# Patient Record
Sex: Female | Born: 1935
Health system: Southern US, Community
[De-identification: ages and names within clinical notes are randomized; demographics above are authoritative.]

## PROBLEM LIST (undated history)

## (undated) DIAGNOSIS — M810 Age-related osteoporosis without current pathological fracture: Secondary | ICD-10-CM

## (undated) DIAGNOSIS — E7800 Pure hypercholesterolemia, unspecified: Secondary | ICD-10-CM

## (undated) DIAGNOSIS — I214 Non-ST elevation (NSTEMI) myocardial infarction: Secondary | ICD-10-CM

## (undated) DIAGNOSIS — I129 Hypertensive chronic kidney disease with stage 1 through stage 4 chronic kidney disease, or unspecified chronic kidney disease: Secondary | ICD-10-CM

## (undated) DIAGNOSIS — N183 Chronic kidney disease, stage 3 unspecified: Secondary | ICD-10-CM

## (undated) DIAGNOSIS — E785 Hyperlipidemia, unspecified: Secondary | ICD-10-CM

## (undated) DIAGNOSIS — E039 Hypothyroidism, unspecified: Secondary | ICD-10-CM

## (undated) DIAGNOSIS — Z9861 Coronary angioplasty status: Secondary | ICD-10-CM

## (undated) DIAGNOSIS — E1122 Type 2 diabetes mellitus with diabetic chronic kidney disease: Secondary | ICD-10-CM

## (undated) DIAGNOSIS — M1990 Unspecified osteoarthritis, unspecified site: Secondary | ICD-10-CM

## (undated) DIAGNOSIS — I10 Essential (primary) hypertension: Secondary | ICD-10-CM

## (undated) DIAGNOSIS — I119 Hypertensive heart disease without heart failure: Secondary | ICD-10-CM

## (undated) DIAGNOSIS — I2510 Atherosclerotic heart disease of native coronary artery without angina pectoris: Secondary | ICD-10-CM

## (undated) DIAGNOSIS — R809 Proteinuria, unspecified: Secondary | ICD-10-CM

## (undated) DIAGNOSIS — IMO0001 Reserved for inherently not codable concepts without codable children: Secondary | ICD-10-CM

## (undated) DIAGNOSIS — D649 Anemia, unspecified: Secondary | ICD-10-CM

## (undated) DIAGNOSIS — I4891 Unspecified atrial fibrillation: Secondary | ICD-10-CM

## (undated) DIAGNOSIS — G5600 Carpal tunnel syndrome, unspecified upper limb: Secondary | ICD-10-CM

## (undated) DIAGNOSIS — E559 Vitamin D deficiency, unspecified: Secondary | ICD-10-CM

## (undated) DIAGNOSIS — M48061 Spinal stenosis, lumbar region without neurogenic claudication: Secondary | ICD-10-CM

## (undated) DIAGNOSIS — M4800 Spinal stenosis, site unspecified: Secondary | ICD-10-CM

## (undated) DIAGNOSIS — R739 Hyperglycemia, unspecified: Secondary | ICD-10-CM

## (undated) DIAGNOSIS — Z9289 Personal history of other medical treatment: Secondary | ICD-10-CM

## (undated) DIAGNOSIS — I495 Sick sinus syndrome: Secondary | ICD-10-CM

## (undated) HISTORY — DX: Spinal stenosis, site unspecified: M48.00

## (undated) HISTORY — DX: Reserved for inherently not codable concepts without codable children: IMO0001

## (undated) HISTORY — DX: Atherosclerotic heart disease of native coronary artery without angina pectoris: I25.10

## (undated) HISTORY — DX: Coronary angioplasty status: Z98.61

## (undated) HISTORY — DX: Anemia, unspecified: D64.9

## (undated) HISTORY — DX: Sick sinus syndrome: I49.5

## (undated) HISTORY — DX: Pure hypercholesterolemia, unspecified: E78.00

## (undated) HISTORY — DX: Unspecified osteoarthritis, unspecified site: M19.90

## (undated) HISTORY — DX: Hypothyroidism, unspecified: E03.9

## (undated) HISTORY — DX: Proteinuria, unspecified: R80.9

## (undated) HISTORY — DX: Spinal stenosis, lumbar region without neurogenic claudication: M48.061

## (undated) HISTORY — DX: Hypertensive heart disease without heart failure: I11.9

## (undated) HISTORY — DX: Non-ST elevation (NSTEMI) myocardial infarction: I21.4

## (undated) HISTORY — DX: Chronic kidney disease, stage 3 unspecified: N18.30

## (undated) HISTORY — DX: Type 2 diabetes mellitus with diabetic chronic kidney disease: E11.22

## (undated) HISTORY — PX: OOPHORECTOMY: SHX86

## (undated) HISTORY — DX: Carpal tunnel syndrome, unspecified upper limb: G56.00

## (undated) HISTORY — DX: Age-related osteoporosis without current pathological fracture: M81.0

## (undated) HISTORY — DX: Personal history of other medical treatment: Z92.89

## (undated) HISTORY — DX: Vitamin D deficiency, unspecified: E55.9

## (undated) HISTORY — DX: Hyperglycemia, unspecified: R73.9

## (undated) HISTORY — DX: Essential (primary) hypertension: I10

## (undated) HISTORY — DX: Hyperlipidemia, unspecified: E78.5

## (undated) HISTORY — DX: Chronic kidney disease, stage 3 (moderate): N18.3

## (undated) HISTORY — DX: Hypertensive chronic kidney disease with stage 1 through stage 4 chronic kidney disease, or unspecified chronic kidney disease: I12.9

## (undated) HISTORY — DX: Unspecified atrial fibrillation: I48.91

## (undated) HISTORY — PX: VESICOVAGINAL FISTULA CLOSURE W/ TAH: SUR271

---

## 1998-09-09 ENCOUNTER — Ambulatory Visit (HOSPITAL_BASED_OUTPATIENT_CLINIC_OR_DEPARTMENT_OTHER): Admission: RE | Admit: 1998-09-09 | Discharge: 1998-09-09 | Payer: Self-pay | Admitting: Orthopedic Surgery

## 2000-03-12 ENCOUNTER — Emergency Department (HOSPITAL_COMMUNITY): Admission: EM | Admit: 2000-03-12 | Discharge: 2000-03-12 | Payer: Self-pay | Admitting: Emergency Medicine

## 2000-03-12 ENCOUNTER — Encounter: Payer: Self-pay | Admitting: Emergency Medicine

## 2000-04-21 ENCOUNTER — Encounter: Admission: RE | Admit: 2000-04-21 | Discharge: 2000-05-03 | Payer: Self-pay | Admitting: Geriatric Medicine

## 2002-01-18 HISTORY — PX: PACEMAKER INSERTION: SHX728

## 2002-02-05 ENCOUNTER — Inpatient Hospital Stay (HOSPITAL_COMMUNITY): Admission: RE | Admit: 2002-02-05 | Discharge: 2002-02-07 | Payer: Self-pay | Admitting: Cardiology

## 2002-02-19 ENCOUNTER — Emergency Department (HOSPITAL_COMMUNITY): Admission: EM | Admit: 2002-02-19 | Discharge: 2002-02-19 | Payer: Self-pay | Admitting: Emergency Medicine

## 2002-02-19 ENCOUNTER — Encounter: Payer: Self-pay | Admitting: Emergency Medicine

## 2002-05-08 ENCOUNTER — Ambulatory Visit (HOSPITAL_COMMUNITY): Admission: RE | Admit: 2002-05-08 | Discharge: 2002-05-09 | Payer: Self-pay | Admitting: Cardiology

## 2002-05-08 HISTORY — PX: PERCUTANEOUS CORONARY STENT INTERVENTION (PCI-S): SHX6016

## 2002-05-09 ENCOUNTER — Encounter: Payer: Self-pay | Admitting: Cardiology

## 2002-05-22 ENCOUNTER — Emergency Department (HOSPITAL_COMMUNITY): Admission: EM | Admit: 2002-05-22 | Discharge: 2002-05-22 | Payer: Self-pay | Admitting: *Deleted

## 2002-05-22 ENCOUNTER — Encounter: Payer: Self-pay | Admitting: *Deleted

## 2002-05-22 ENCOUNTER — Inpatient Hospital Stay (HOSPITAL_COMMUNITY): Admission: EM | Admit: 2002-05-22 | Discharge: 2002-05-24 | Payer: Self-pay | Admitting: Emergency Medicine

## 2003-04-29 ENCOUNTER — Ambulatory Visit (HOSPITAL_COMMUNITY): Admission: RE | Admit: 2003-04-29 | Discharge: 2003-04-29 | Payer: Self-pay | Admitting: Cardiology

## 2003-05-04 ENCOUNTER — Ambulatory Visit (HOSPITAL_COMMUNITY): Admission: RE | Admit: 2003-05-04 | Discharge: 2003-05-04 | Payer: Self-pay | Admitting: Internal Medicine

## 2003-05-06 ENCOUNTER — Inpatient Hospital Stay (HOSPITAL_COMMUNITY): Admission: EM | Admit: 2003-05-06 | Discharge: 2003-05-13 | Payer: Self-pay | Admitting: Emergency Medicine

## 2003-05-13 ENCOUNTER — Inpatient Hospital Stay: Admission: RE | Admit: 2003-05-13 | Discharge: 2003-05-18 | Payer: Self-pay | Admitting: Internal Medicine

## 2004-04-06 ENCOUNTER — Encounter: Admission: RE | Admit: 2004-04-06 | Discharge: 2004-04-06 | Payer: Self-pay | Admitting: Geriatric Medicine

## 2005-04-08 ENCOUNTER — Encounter: Admission: RE | Admit: 2005-04-08 | Discharge: 2005-04-08 | Payer: Self-pay | Admitting: Geriatric Medicine

## 2005-05-04 ENCOUNTER — Ambulatory Visit: Payer: Self-pay | Admitting: Internal Medicine

## 2005-05-08 ENCOUNTER — Emergency Department (HOSPITAL_COMMUNITY): Admission: EM | Admit: 2005-05-08 | Discharge: 2005-05-08 | Payer: Self-pay | Admitting: Emergency Medicine

## 2006-03-04 ENCOUNTER — Ambulatory Visit: Payer: Self-pay | Admitting: Vascular Surgery

## 2006-04-09 ENCOUNTER — Emergency Department (HOSPITAL_COMMUNITY): Admission: EM | Admit: 2006-04-09 | Discharge: 2006-04-09 | Payer: Self-pay | Admitting: Emergency Medicine

## 2006-04-15 ENCOUNTER — Encounter: Admission: RE | Admit: 2006-04-15 | Discharge: 2006-04-15 | Payer: Self-pay | Admitting: Geriatric Medicine

## 2006-04-27 ENCOUNTER — Encounter: Admission: RE | Admit: 2006-04-27 | Discharge: 2006-04-27 | Payer: Self-pay | Admitting: Geriatric Medicine

## 2006-08-10 ENCOUNTER — Emergency Department (HOSPITAL_COMMUNITY): Admission: EM | Admit: 2006-08-10 | Discharge: 2006-08-10 | Payer: Self-pay | Admitting: Emergency Medicine

## 2006-09-02 ENCOUNTER — Ambulatory Visit: Payer: Self-pay | Admitting: Vascular Surgery

## 2007-01-02 ENCOUNTER — Encounter: Admission: RE | Admit: 2007-01-02 | Discharge: 2007-01-02 | Payer: Self-pay | Admitting: Geriatric Medicine

## 2007-01-20 ENCOUNTER — Inpatient Hospital Stay (HOSPITAL_COMMUNITY): Admission: EM | Admit: 2007-01-20 | Discharge: 2007-01-21 | Payer: Self-pay | Admitting: Emergency Medicine

## 2007-04-18 ENCOUNTER — Encounter: Admission: RE | Admit: 2007-04-18 | Discharge: 2007-04-18 | Payer: Self-pay | Admitting: Geriatric Medicine

## 2008-05-03 ENCOUNTER — Encounter: Admission: RE | Admit: 2008-05-03 | Discharge: 2008-05-03 | Payer: Self-pay | Admitting: Geriatric Medicine

## 2008-05-15 ENCOUNTER — Encounter: Admission: RE | Admit: 2008-05-15 | Discharge: 2008-05-15 | Payer: Self-pay | Admitting: Geriatric Medicine

## 2009-02-07 HISTORY — PX: CARPAL TUNNEL RELEASE: SHX101

## 2009-05-02 ENCOUNTER — Ambulatory Visit (HOSPITAL_BASED_OUTPATIENT_CLINIC_OR_DEPARTMENT_OTHER): Admission: RE | Admit: 2009-05-02 | Discharge: 2009-05-02 | Payer: Self-pay | Admitting: Orthopedic Surgery

## 2009-06-09 ENCOUNTER — Encounter: Admission: RE | Admit: 2009-06-09 | Discharge: 2009-06-09 | Payer: Self-pay | Admitting: Geriatric Medicine

## 2009-07-18 ENCOUNTER — Observation Stay (HOSPITAL_COMMUNITY): Admission: EM | Admit: 2009-07-18 | Discharge: 2009-07-19 | Payer: Self-pay | Admitting: Emergency Medicine

## 2009-07-19 ENCOUNTER — Encounter (INDEPENDENT_AMBULATORY_CARE_PROVIDER_SITE_OTHER): Payer: Self-pay | Admitting: Internal Medicine

## 2009-07-19 DIAGNOSIS — Z9289 Personal history of other medical treatment: Secondary | ICD-10-CM

## 2009-07-19 HISTORY — DX: Personal history of other medical treatment: Z92.89

## 2009-08-04 ENCOUNTER — Ambulatory Visit (HOSPITAL_COMMUNITY): Admission: RE | Admit: 2009-08-04 | Discharge: 2009-08-04 | Payer: Self-pay | Admitting: Cardiology

## 2009-08-04 HISTORY — PX: PACEMAKER GENERATOR CHANGE: SHX5998

## 2009-09-21 DIAGNOSIS — I214 Non-ST elevation (NSTEMI) myocardial infarction: Secondary | ICD-10-CM

## 2009-09-21 HISTORY — DX: Non-ST elevation (NSTEMI) myocardial infarction: I21.4

## 2009-09-21 HISTORY — PX: CORONARY ANGIOPLASTY WITH STENT PLACEMENT: SHX49

## 2009-09-22 ENCOUNTER — Inpatient Hospital Stay (HOSPITAL_COMMUNITY): Admission: EM | Admit: 2009-09-22 | Discharge: 2009-09-25 | Payer: Self-pay | Admitting: Emergency Medicine

## 2009-12-30 ENCOUNTER — Inpatient Hospital Stay (HOSPITAL_COMMUNITY)
Admission: EM | Admit: 2009-12-30 | Discharge: 2009-12-31 | Payer: Self-pay | Source: Home / Self Care | Attending: Cardiology | Admitting: Cardiology

## 2010-02-08 ENCOUNTER — Encounter: Payer: Self-pay | Admitting: Geriatric Medicine

## 2010-02-18 DIAGNOSIS — R809 Proteinuria, unspecified: Secondary | ICD-10-CM

## 2010-02-18 HISTORY — DX: Proteinuria, unspecified: R80.9

## 2010-03-30 LAB — BASIC METABOLIC PANEL
BUN: 22 mg/dL (ref 6–23)
CO2: 24 mEq/L (ref 19–32)
Calcium: 9.5 mg/dL (ref 8.4–10.5)
Creatinine, Ser: 1 mg/dL (ref 0.4–1.2)
GFR calc non Af Amer: 52 mL/min — ABNORMAL LOW (ref >60)
Glucose, Bld: 95 mg/dL (ref 70–99)

## 2010-03-30 LAB — URINALYSIS, ROUTINE W REFLEX MICROSCOPIC
Bilirubin Urine: NEGATIVE
Glucose, UA: NEGATIVE mg/dL
Ketones, ur: NEGATIVE mg/dL
Leukocytes, UA: NEGATIVE
Protein, ur: NEGATIVE mg/dL

## 2010-03-30 LAB — POCT CARDIAC MARKERS
CKMB, poc: 2.6 ng/mL (ref 1.0–8.0)
Myoglobin, poc: 113 ng/mL (ref 12–200)

## 2010-03-30 LAB — MRSA PCR SCREENING: MRSA by PCR: NEGATIVE

## 2010-03-30 LAB — DIFFERENTIAL
Basophils Absolute: 0 10*3/uL (ref 0.0–0.1)
Basophils Relative: 0 % (ref 0–1)
Eosinophils Absolute: 0.3 10*3/uL (ref 0.0–0.7)
Monocytes Relative: 7 % (ref 3–12)
Neutro Abs: 5.5 10*3/uL (ref 1.7–7.7)
Neutrophils Relative %: 61 % (ref 43–77)

## 2010-03-30 LAB — CBC
MCH: 29.6 pg (ref 26.0–34.0)
MCHC: 32.2 g/dL (ref 30.0–36.0)
RDW: 13.7 % (ref 11.5–15.5)

## 2010-03-30 LAB — URINE CULTURE
Colony Count: 60000
Culture  Setup Time: 201112131905

## 2010-03-30 LAB — CARDIAC PANEL(CRET KIN+CKTOT+MB+TROPI)
CK, MB: 3.5 ng/mL (ref 0.3–4.0)
Relative Index: INVALID (ref 0.0–2.5)
Total CK: 84 U/L (ref 7–177)
Troponin I: 0.05 ng/mL (ref 0.00–0.06)

## 2010-03-30 LAB — CK TOTAL AND CKMB (NOT AT ARMC)
CK, MB: 3.6 ng/mL (ref 0.3–4.0)
Total CK: 96 U/L (ref 7–177)

## 2010-03-30 LAB — HEPARIN LEVEL (UNFRACTIONATED): Heparin Unfractionated: 0.44 IU/mL (ref 0.30–0.70)

## 2010-04-02 LAB — CBC
HCT: 38.9 % (ref 36.0–46.0)
HCT: 41.1 % (ref 36.0–46.0)
Hemoglobin: 13.7 g/dL (ref 12.0–15.0)
MCH: 30.9 pg (ref 26.0–34.0)
MCH: 31.3 pg (ref 26.0–34.0)
MCHC: 33.3 g/dL (ref 30.0–36.0)
MCHC: 33.4 g/dL (ref 30.0–36.0)
MCHC: 33.5 g/dL (ref 30.0–36.0)
MCV: 91.3 fL (ref 78.0–100.0)
MCV: 92.4 fL (ref 78.0–100.0)
MCV: 93 fL (ref 78.0–100.0)
Platelets: 197 10*3/uL (ref 150–400)
Platelets: 218 10*3/uL (ref 150–400)
Platelets: 228 10*3/uL (ref 150–400)
RBC: 4.02 MIL/uL (ref 3.87–5.11)
RBC: 4.42 MIL/uL (ref 3.87–5.11)
RDW: 13.7 % (ref 11.5–15.5)
RDW: 13.7 % (ref 11.5–15.5)
RDW: 13.8 % (ref 11.5–15.5)
WBC: 10.4 10*3/uL (ref 4.0–10.5)
WBC: 6.6 10*3/uL (ref 4.0–10.5)

## 2010-04-02 LAB — URINALYSIS, ROUTINE W REFLEX MICROSCOPIC
Leukocytes, UA: NEGATIVE
Nitrite: NEGATIVE
Specific Gravity, Urine: 1.009 (ref 1.005–1.030)
Urobilinogen, UA: 0.2 mg/dL (ref 0.0–1.0)
pH: 6.5 (ref 5.0–8.0)

## 2010-04-02 LAB — BASIC METABOLIC PANEL
Calcium: 8.8 mg/dL (ref 8.4–10.5)
Creatinine, Ser: 1.15 mg/dL (ref 0.4–1.2)
GFR calc Af Amer: 54 mL/min — ABNORMAL LOW (ref >60)
GFR calc non Af Amer: 45 mL/min — ABNORMAL LOW (ref >60)

## 2010-04-02 LAB — HEPARIN LEVEL (UNFRACTIONATED)
Heparin Unfractionated: 0.36 IU/mL (ref 0.30–0.70)
Heparin Unfractionated: 0.59 IU/mL (ref 0.30–0.70)

## 2010-04-02 LAB — URINE MICROSCOPIC-ADD ON

## 2010-04-02 LAB — COMPREHENSIVE METABOLIC PANEL
Alkaline Phosphatase: 69 U/L (ref 39–117)
BUN: 20 mg/dL (ref 6–23)
Calcium: 8.8 mg/dL (ref 8.4–10.5)
Creatinine, Ser: 1.05 mg/dL (ref 0.4–1.2)
Glucose, Bld: 147 mg/dL — ABNORMAL HIGH (ref 70–99)
Potassium: 4.4 mEq/L (ref 3.5–5.1)
Total Protein: 6.2 g/dL (ref 6.0–8.3)

## 2010-04-02 LAB — CARDIAC PANEL(CRET KIN+CKTOT+MB+TROPI)
Relative Index: INVALID (ref 0.0–2.5)
Total CK: 108 U/L (ref 7–177)
Total CK: 95 U/L (ref 7–177)
Troponin I: 0.14 ng/mL — ABNORMAL HIGH (ref 0.00–0.06)

## 2010-04-02 LAB — DIFFERENTIAL
Basophils Relative: 0 % (ref 0–1)
Lymphocytes Relative: 26 % (ref 12–46)
Monocytes Relative: 7 % (ref 3–12)
Neutro Abs: 4.7 10*3/uL (ref 1.7–7.7)
Neutrophils Relative %: 63 % (ref 43–77)

## 2010-04-02 LAB — POCT I-STAT, CHEM 8
Calcium, Ion: 1.02 mmol/L — ABNORMAL LOW (ref 1.12–1.32)
Chloride: 109 mEq/L (ref 96–112)
HCT: 41 % (ref 36.0–46.0)
Sodium: 137 mEq/L (ref 135–145)
TCO2: 22 mmol/L (ref 0–100)

## 2010-04-02 LAB — HEMOGLOBIN A1C: Hgb A1c MFr Bld: 6.4 % — ABNORMAL HIGH (ref <5.7)

## 2010-04-02 LAB — POCT CARDIAC MARKERS: Troponin i, poc: <0.05 ng/mL (ref 0.00–0.09)

## 2010-04-02 LAB — PROTIME-INR: INR: 0.99 (ref 0.00–1.49)

## 2010-04-02 LAB — CK TOTAL AND CKMB (NOT AT ARMC)
Relative Index: 3.1 — ABNORMAL HIGH (ref 0.0–2.5)
Total CK: 112 U/L (ref 7–177)

## 2010-04-02 LAB — APTT: aPTT: 29 seconds (ref 24–37)

## 2010-04-04 LAB — SURGICAL PCR SCREEN
MRSA, PCR: NEGATIVE
Staphylococcus aureus: NEGATIVE

## 2010-04-05 LAB — CBC
HCT: 41.3 % (ref 36.0–46.0)
Platelets: 198 10*3/uL (ref 150–400)
RDW: 13.9 % (ref 11.5–15.5)
WBC: 9.9 10*3/uL (ref 4.0–10.5)

## 2010-04-05 LAB — CARDIAC PANEL(CRET KIN+CKTOT+MB+TROPI)
Relative Index: INVALID (ref 0.0–2.5)
Total CK: 73 U/L (ref 7–177)
Total CK: 76 U/L (ref 7–177)
Troponin I: 0.04 ng/mL (ref 0.00–0.06)

## 2010-04-05 LAB — POCT CARDIAC MARKERS
CKMB, poc: <1 ng/mL — ABNORMAL LOW (ref 1.0–8.0)
Myoglobin, poc: 107 ng/mL (ref 12–200)
Troponin i, poc: <0.05 ng/mL (ref 0.00–0.09)

## 2010-04-05 LAB — URINALYSIS, ROUTINE W REFLEX MICROSCOPIC
Protein, ur: NEGATIVE mg/dL
Urobilinogen, UA: 0.2 mg/dL (ref 0.0–1.0)

## 2010-04-05 LAB — URINE CULTURE

## 2010-04-05 LAB — BASIC METABOLIC PANEL
BUN: 21 mg/dL (ref 6–23)
BUN: 24 mg/dL — ABNORMAL HIGH (ref 6–23)
CO2: 26 mEq/L (ref 19–32)
Calcium: 8.8 mg/dL (ref 8.4–10.5)
Creatinine, Ser: 1.18 mg/dL (ref 0.4–1.2)
Creatinine, Ser: 1.24 mg/dL — ABNORMAL HIGH (ref 0.4–1.2)
GFR calc non Af Amer: 41 mL/min — ABNORMAL LOW (ref >60)
GFR calc non Af Amer: 43 mL/min — ABNORMAL LOW (ref >60)
Glucose, Bld: 99 mg/dL (ref 70–99)
Potassium: 4.9 mEq/L (ref 3.5–5.1)
Sodium: 136 mEq/L (ref 135–145)

## 2010-04-05 LAB — URINE MICROSCOPIC-ADD ON

## 2010-04-05 LAB — DIFFERENTIAL
Basophils Absolute: 0 10*3/uL (ref 0.0–0.1)
Lymphocytes Relative: 24 % (ref 12–46)
Neutro Abs: 6.5 10*3/uL (ref 1.7–7.7)

## 2010-04-05 LAB — HEPATITIS PANEL, ACUTE
HCV Ab: NEGATIVE
Hep A IgM: NEGATIVE
Hep B C IgM: NEGATIVE

## 2010-04-05 LAB — TROPONIN I: Troponin I: 0.04 ng/mL (ref 0.00–0.06)

## 2010-04-05 LAB — LIPID PANEL
HDL: 43 mg/dL (ref >39)
LDL Cholesterol: 41 mg/dL (ref 0–99)
Total CHOL/HDL Ratio: 2.6 RATIO
Triglycerides: 139 mg/dL (ref <150)
VLDL: 28 mg/dL (ref 0–40)

## 2010-04-05 LAB — CK TOTAL AND CKMB (NOT AT ARMC): CK, MB: 2.6 ng/mL (ref 0.3–4.0)

## 2010-04-05 LAB — APTT: aPTT: 32 seconds (ref 24–37)

## 2010-04-07 LAB — POCT HEMOGLOBIN-HEMACUE: Hemoglobin: 13.7 g/dL (ref 12.0–15.0)

## 2010-04-08 LAB — BASIC METABOLIC PANEL
BUN: 23 mg/dL (ref 6–23)
Calcium: 8.8 mg/dL (ref 8.4–10.5)
Creatinine, Ser: 1.09 mg/dL (ref 0.4–1.2)
GFR calc non Af Amer: 47 mL/min — ABNORMAL LOW (ref >60)
Glucose, Bld: 116 mg/dL — ABNORMAL HIGH (ref 70–99)
Potassium: 5 mEq/L (ref 3.5–5.1)

## 2010-04-17 ENCOUNTER — Other Ambulatory Visit: Payer: Self-pay | Admitting: Internal Medicine

## 2010-04-17 ENCOUNTER — Ambulatory Visit
Admission: RE | Admit: 2010-04-17 | Discharge: 2010-04-17 | Disposition: A | Discharge status: Home / Self Care | Payer: Medicare Other | Source: Ambulatory Visit | Attending: Internal Medicine | Admitting: Internal Medicine

## 2010-04-17 MED ORDER — IOHEXOL 300 MG/ML  SOLN
100.0000 mL | Freq: Once | INTRAMUSCULAR | Status: AC | PRN
  Administered 2010-04-17: 100 mL via INTRAVENOUS

## 2010-05-01 ENCOUNTER — Other Ambulatory Visit: Payer: Self-pay | Admitting: Geriatric Medicine

## 2010-05-01 DIAGNOSIS — J189 Pneumonia, unspecified organism: Secondary | ICD-10-CM

## 2010-05-07 ENCOUNTER — Ambulatory Visit
Admission: RE | Admit: 2010-05-07 | Discharge: 2010-05-07 | Disposition: A | Discharge status: Home / Self Care | Payer: Medicare Other | Source: Ambulatory Visit | Attending: Geriatric Medicine | Admitting: Geriatric Medicine

## 2010-05-07 DIAGNOSIS — J189 Pneumonia, unspecified organism: Secondary | ICD-10-CM

## 2010-05-07 MED ORDER — IOHEXOL 300 MG/ML  SOLN
75.0000 mL | Freq: Once | INTRAMUSCULAR | Status: AC | PRN
  Administered 2010-05-07: 75 mL via INTRAVENOUS

## 2010-05-10 ENCOUNTER — Emergency Department (HOSPITAL_COMMUNITY): Payer: Medicare Other

## 2010-05-10 ENCOUNTER — Emergency Department (HOSPITAL_COMMUNITY)
Admission: EM | Admit: 2010-05-10 | Discharge: 2010-05-10 | Disposition: A | Discharge status: Home / Self Care | Payer: Medicare Other | Attending: Emergency Medicine | Admitting: Emergency Medicine

## 2010-05-10 DIAGNOSIS — Z79899 Other long term (current) drug therapy: Secondary | ICD-10-CM | POA: Insufficient documentation

## 2010-05-10 DIAGNOSIS — E785 Hyperlipidemia, unspecified: Secondary | ICD-10-CM | POA: Insufficient documentation

## 2010-05-10 DIAGNOSIS — R000 Tachycardia, unspecified: Secondary | ICD-10-CM | POA: Insufficient documentation

## 2010-05-10 DIAGNOSIS — I252 Old myocardial infarction: Secondary | ICD-10-CM | POA: Insufficient documentation

## 2010-05-10 DIAGNOSIS — I499 Cardiac arrhythmia, unspecified: Secondary | ICD-10-CM | POA: Insufficient documentation

## 2010-05-10 DIAGNOSIS — I10 Essential (primary) hypertension: Secondary | ICD-10-CM | POA: Insufficient documentation

## 2010-05-10 DIAGNOSIS — R002 Palpitations: Secondary | ICD-10-CM | POA: Insufficient documentation

## 2010-05-10 DIAGNOSIS — I2510 Atherosclerotic heart disease of native coronary artery without angina pectoris: Secondary | ICD-10-CM | POA: Insufficient documentation

## 2010-05-10 DIAGNOSIS — E039 Hypothyroidism, unspecified: Secondary | ICD-10-CM | POA: Insufficient documentation

## 2010-05-10 DIAGNOSIS — R51 Headache: Secondary | ICD-10-CM | POA: Insufficient documentation

## 2010-05-10 DIAGNOSIS — Z950 Presence of cardiac pacemaker: Secondary | ICD-10-CM | POA: Insufficient documentation

## 2010-05-10 DIAGNOSIS — I4891 Unspecified atrial fibrillation: Secondary | ICD-10-CM | POA: Insufficient documentation

## 2010-05-10 DIAGNOSIS — M129 Arthropathy, unspecified: Secondary | ICD-10-CM | POA: Insufficient documentation

## 2010-05-10 LAB — DIFFERENTIAL
Basophils Relative: 1 % (ref 0–1)
Eosinophils Absolute: 0.3 10*3/uL (ref 0.0–0.7)
Eosinophils Relative: 3 % (ref 0–5)
Lymphs Abs: 3.1 10*3/uL (ref 0.7–4.0)
Neutrophils Relative %: 50 % (ref 43–77)

## 2010-05-10 LAB — BASIC METABOLIC PANEL
BUN: 18 mg/dL (ref 6–23)
Calcium: 9 mg/dL (ref 8.4–10.5)
Creatinine, Ser: 1.17 mg/dL (ref 0.4–1.2)
GFR calc non Af Amer: 44 mL/min — ABNORMAL LOW (ref >60)
Potassium: 4 mEq/L (ref 3.5–5.1)

## 2010-05-10 LAB — URINE MICROSCOPIC-ADD ON

## 2010-05-10 LAB — CBC
MCH: 30.5 pg (ref 26.0–34.0)
MCHC: 33.4 g/dL (ref 30.0–36.0)
RBC: 4.3 MIL/uL (ref 3.87–5.11)
WBC: 8.2 10*3/uL (ref 4.0–10.5)

## 2010-05-10 LAB — URINALYSIS, ROUTINE W REFLEX MICROSCOPIC
Glucose, UA: NEGATIVE mg/dL
Protein, ur: NEGATIVE mg/dL
pH: 7 (ref 5.0–8.0)

## 2010-05-10 LAB — POCT CARDIAC MARKERS
CKMB, poc: 1.2 ng/mL (ref 1.0–8.0)
Myoglobin, poc: 85.6 ng/mL (ref 12–200)
Troponin i, poc: <0.05 ng/mL (ref 0.00–0.09)

## 2010-05-10 MED ORDER — IOHEXOL 300 MG/ML  SOLN: 100.0000 mL | Freq: Once | INTRAMUSCULAR | Status: DC | PRN

## 2010-05-19 ENCOUNTER — Other Ambulatory Visit: Payer: Self-pay | Admitting: Geriatric Medicine

## 2010-05-19 DIAGNOSIS — Z1231 Encounter for screening mammogram for malignant neoplasm of breast: Secondary | ICD-10-CM

## 2010-06-02 NOTE — Procedures (Signed)
DUPLEX DEEP VENOUS EXAM - LOWER EXTREMITY   INDICATION:   HISTORY:  Edema:  Left calf.  Trauma/Surgery:  No.  Pain:  Left upper calf.  PE:  No.  Previous DVT:  No.  Anticoagulants:  Aspirin 1 per day.  Other:   INDICATIONS:  Left upper calf pain for approximately 24 hours.   DUPLEX EXAM:                CFV   SFV   PopV  PTV    GSV                R  L  R  L  R  L  R   L  R  L  Thrombosis    0  0     0     0      0     0  Spontaneous   +  +     +     +      +     +  Phasic        +  +     +     +      +     +  Augmentation  +  +     +     +      +     +  Compressible  +  +     +     +      +     +  Competent     +  +     +     +      +     +   Legend:  + - yes  o - no  p - partial  D - decreased   IMPRESSION:  No evidence of left lower extremity deep vein thrombosis.   A preliminary report was called to Dr. Laverle Hobby office.    _____________________________  Di Kindle. Edilia Bo, M.D.   DP/MEDQ  D:  09/02/2006  T:  09/03/2006  Job:  [id number]   cc:   Hal T. Stoneking, M.D.

## 2010-06-05 NOTE — Discharge Summary (Signed)
[name], [name]                          ACCOUNT NO.:  [id number]   MEDICAL RECORD NO.:  [id number]                   PATIENT TYPE:  INP   LOCATION:  3704                                 FACILITY:  [facility]   PHYSICIAN:  Jackie Plum, M.D.             DATE OF BIRTH:  September 25, 1918   DATE OF ADMISSION:  05/05/2003  DATE OF DISCHARGE:  05/13/2003                                 DISCHARGE SUMMARY   DISCHARGE DIAGNOSIS:  Coumadin.   SECONDARY DIAGNOSES:  1. Intractable pain secondary to gluteal hematoma.  2. Gluteal hematoma.  3. Chronic anticoagulation for chronic atrial fibrillation.     a. Coumadin has been discontinued for now.  4. History of atrial fibrillation, status post pacemaker.  5. History of coronary artery disease.  6. History of tachy/brady syndrome, status post pacemaker placement in April     of 2004.  7. History of chronic anemia.  8. History of osteoarthritis.  9. Intermittent nausea and constipation.  10.      History of allergy to penicillin.   MEDICATIONS ON DISCHARGE FROM ACUTE HOSPITAL:  1. Amiodarone 200 mg p.o. daily.  2. Lipitor 40 mg p.o. q.h.s.  3. Colace 100 mg daily.  4. Zetia 10 mg p.o. daily.  5. Duragesic patch 25 mcg transdermal patch q.72h.  6. Imdur 30 mg p.o. daily.  7. MiraLax 17 g p.o. daily.  8. Senokot one tablet p.o. b.i.d.  9. Tylenol 1000 mg p.o. q.6h. p.r.n.  10.      Enema of choice p.r.n.  11.      Lortab one tablet p.o. q.6h. p.r.n.  12.      Lactulose 15-30 ml p.o. p.r.n.  13.      Ativan 0.5 mg p.o. q.6h. p.r.n.  14.      Maalox 30 ml p.o. p.r.n.  15.      Zofran 4 mg IV q.4h. p.r.n.  16.      Phenergan 12.5-25 mg IV q.4-6h. p.r.n.   DISCHARGE LABORATORIES:  WBC count 8.6, hemoglobin 12.3, hematocrit 35.1,  MCV 19.4, platelet count 316.  Pro time 17.9, PTT 37, INR 1.8.  These were  on May 07, 2003.  Sodium 126, potassium 4.2, chloride 106, CO2 27, glucose  110, BUN 17, creatinine 1.0.  Total bilirubin 0.9, alkaline  phosphatase 125,  SGOT 54, SGPT 61, total protein 5.9, albumin 2.7, calcium 8.5.   DISPOSITION:  The patient is going to the subacute unit.   REASON FOR ACUTE HOSPITALIZATION:  The patient is an 77 year old Caucasian  lady who was admitted for gluteal hematoma and intractable pain.  The  patient had been seen by Molly Maduro A. Thurston Hole, M.D., on April 29, 2003, on  account of her pain and was apparently diagnosed with a pulled muscle and  treated with heat and physical therapy.  However, the patient's pain  worsened.  She was seen on May 03, 2003, at which time a  diagnosis of  hematoma of the buttock was made.  Her INR at that time was 1.9 with a  hemoglobin of 10.4.  However,  her pain worsened with her inability to  ambulate.  She was experiencing some nausea.  Therefore, she was  subsequently admitted to the hospitalist service for further management and  pain control.   PHYSICAL EXAMINATION:  According to the admission H&P by Theressa Millard,  M.D.:  VITAL SIGNS:  The BP was 140/70 and pulse of 72.  LUNGS:  Said to be clear to auscultation.  CARDIAC:  Noted to be irregular rhythm without any gallops, murmurs, or  clicks.  ABDOMEN:  Soft and nontender.  EXTREMITIES:  Exam revealed bruising of the right buttock and down the right  lower leg to a level just above the ankle.   LABORATORY DATA:  Her hemoglobin was 9.2 with normal MCV.  BMET was within  normal limits.  The EKG showed atrial fibrillation with no acute ST-T wave  changes.  CT scan showed a large hematoma of the right buttock without any  retroperitoneal hemorrhage.  She was therefore admitted to the hospitalist  service for pain management for her gluteal hematoma.   HOSPITAL COURSE:  The patient was admitted to the hospitalist service.  Her  INR check came back to be 1.9.  Her Coumadin was held.  She received pain  management/control with a Duragesic patch and morphine.  Also, antiemetics  were added to her medication  regimen for her nausea.  Vicodin p.r.n. was  also instituted.  Her cardiac medications were continued.  Cardiac enzymes  routinely were obtained, which were negative for myocardial infarction.  The  hemoglobin was monitored carefully.  On account of drop in her hemoglobin,  she was transfused two units of blood on May 07, 2003, with maintenance of  her hemoglobin and hematocrit without any significant drops thereafter.  There has not been any evidence of continued bleeding since then.  The  patient was seen in consultation by her cardiologist, Dr. Amil Amen, after  thorough discussion with the patient and the patient's son, came to the  conclusion that it is appropriate to hold the patient's Coumadin for now and  Coumadin is not to be started until further clarification later, probably at  the outpatient level on discharge from the hospital.  Ms. Curiale continues  to do well today.  Her pain has been appropriately controlled.  She was seen  by PT and is deemed appropriate for [facility] transfer for further restorative  treatment.  She will be continued on amiodarone for now for her paroxysmal  atrial fibrillation.  She will continue on antiemetics as noted above.  Her  nausea is possibly related to her amiodarone now.                                                Jackie Plum, M.D.    GO/MEDQ  D:  05/13/2003  T:  05/13/2003  Job:  [id number]

## 2010-06-05 NOTE — Cardiovascular Report (Signed)
[name], [name]                        ACCOUNT NO.:  [id number]   MEDICAL RECORD NO.:  [id number]                   PATIENT TYPE:  INP   LOCATION:  2905                                 FACILITY:  [facility]   PHYSICIAN:  Francisca December, M.D.               DATE OF BIRTH:  10-24-1918   DATE OF PROCEDURE:  05/23/2002  DATE OF DISCHARGE:                              CARDIAC CATHETERIZATION   CONTINUATION:   DESCRIPTION OF PROCEDURE:  A 110 cm pigtail catheter was used to measure  pressures in the ascending aorta and the left ventricle both prior to and  following the ventriculogram.  A 30 degree RAO cine left ventriculogram was  performed utilizing a power injector.  The pigtail catheter was then  exchanged for a 5 French #4 left Judkins catheter.  Cine coronary of the  left coronary artery was conducted in multiple LAO and RAO projections.  The  left Judkins catheter was exchanged for a 5 [place] #4 right Judkins  catheter.  Cine angiography of the right coronary artery was conducted in  multiple LAO and RAO projections.  All catheter manipulations were performed  using fluoroscopic observation and exchanges performed over a long guiding J-  wire.  At this point, I proceeded with intravascular ultrasound.  The 5  French catheter sheath was removed over one guiding J-wire and replaced with  a 23 cm 6 French catheter sheath.  This was advanced under fluoroscopic  observation without difficulty.  A 6 French 3.5CLS guiding catheter was  advanced to the ascending where the left coronary os was engaged.  An ACT  was obtained and found to be 215 seconds.  She received an additional 1500  units of heparin.  A 0.14 inch SciMed luge intracoronary guidewire was  advanced along the long stented segment in the proximal and mid LAD without  difficulty.  A SciMed Atlantis ultrasound catheter was then used to obtain  intravascular images. Two different passes were obtained.  One was an  automatic  pullback device.  The other was done manually.  These images were  analyzed.  Cine angiography was performed in orthogonal views once again  both with and without the guidewire in place.  The guidewire and guiding  catheter were removed.  The catheter sheath was sutured into place.  The  followup ACT was 285 seconds.  The patient was transported to the recovery  area in stable condition with an intact distal pulse.  She had converted to  atrial fibrillation during the evening, and she therefore received a 15 mg  of IV Cardizem and was begun on an intravenous amiodarone infusion at 17 mL  per hour following the intravenous bolus injection of 150 mg.  She also  developed an urticarial rash on her forearms and dorsal surface of the hand  at completion.  She received 2500 mg of Benadryl and 125 mL of Solu-Medrol  for this.  She had previously been medicated with 60 mg of prednisone due to  a history of allergy to shellfish.   HEMODYNAMICS:  1. Systemic arterial pressure was 126/67 with a mean of 93 mmHg.  There was     no systolic gradient across the aortic valve.  The left ventricular end-     diastolic pressure was 19 mmHg preventriculogram.   ANGIOGRAPHY:  The left ventriculogram a small left ventricular chamber with  near complete obliteration of the cavity during systole.  The visual  estimate of the ejection fraction is 75-80%.  There is 1-2+ mitral  regurgitation and coronary calcification seen throughout the coronary tree.   There was a right dominant coronary system present.  The main right coronary  artery was short and without significant obstruction.  The left anterior  descending artery contained approximately a 40 mm stented segment from near  the ostium to the junction of the mid and distal segment.  In the mid  portion there was a hypodense segment just after the origin of the diagonal  and septal perforator.  This was seen on an ROA cranial view.  It was widely  patent by  angiography in the straight RAO view.  However, in the RAO caudal  view, there appeared to be a 30-50% stenosis in this region.   There was a 70-80% stenosis that was focal at the origin of the first large  diagonal branch, unchanged from previous angiography at the completion of  her stent implantation procedure.   The left circumflex coronary artery gives rise to two marginal branches, the  first of which is small and the second one which is large.  The ongoing  circumflex is small and gives rise to small posterior lateral branches.  No  significant structures are seen in the left circumflex artery system.  There  are luminal irregularities and diffuse disease throughout.   The right coronary artery is large and dominant.  There is diffuse disease  and luminal irregularities throughout.  Multiple 20 and 25% lesions are  seen.  In the midportion there is a 30-40% focal stenosis.  The vessel  bifurcates distally into a large posterior descending artery and a large  posterior lateral segment and large left ventricular branch.  Again, no  significant structures are seen in this vessel.   Collateral vessels are not seen.   INTRAVASCULAR ULTRASOUND:  This study demonstrated the stented segment to be  widely patent throughout.  The area of previous concern by angiography was  carefully imaged, and no significant obstruction noticed.  The RV did narrow  from an average of 2.5 to 3.0 mm distally to around 2.0 to 2.2 mm in the  segment just after the diagonal branch and septal perforator.  However, no  focal stenosis was seen either by cross sectional or longitudinal views.  Stent struts were well opposed throughout.   FINAL IMPRESSION:  1. Atherosclerotic cerebrovascular disease, single vessel.  2. Intact left ventricular size and systolic function with evidence of     significant ventricular hypertrophy. 3. New onset of recurrent atrial fibrillation.  4. Contrast dye urticarial  reaction.  5. Mild mitral regurgitation.  6. No evidence of significant focal stenosis to explain left anginal     symptoms.   PLAN:  We will begin long acting nitrates and probably a proton pump  inhibitor in the unlikely possibility that this was related to  gastroesophageal reflux.  We will also initiate an IV amiodarone infusion to  attempt a chemical cardioversion.  We will remove the sheath when the ACT  falls below 150.  The patient will require at least 6-8 hours of bed rest  following.  Her right femoral artery is heavily calcified and was not  amenable to percutaneous closure.                                               Francisca December, M.D.    JHE/MEDQ  D:  05/23/2002  T:  05/24/2002  Job:  [id number]   cc:   Hal T. Stoneking, M.D.  [address] [place], [place] [address]  Fax: [phone]   Cardiac Catheterization Lab

## 2010-06-05 NOTE — Consult Note (Signed)
[name], [name] NO.:  [id number]   MEDICAL RECORD NO.:  [id number]          PATIENT TYPE:  EMS   LOCATION:  MAJO                         FACILITY:  [facility]   PHYSICIAN:  Marlan Palau, M.D.  DATE OF BIRTH:  07/24/1918   DATE OF CONSULTATION:  04/09/2006  DATE OF DISCHARGE:                                 CONSULTATION   HISTORY OF PRESENT ILLNESS:  [name] is an 77 year old, right-  handed, white female born 05-22-1918, with a history of coronary  artery disease, hypertension, tachybrady syndrome, status post cardiac  pacer placement.  This patient has no prior history of migraine, but  does recall one event where she had zigzag lines in her vision at some  point in the past.  The patient however generally does not get headaches  on a regular basis.  The patient comes to the emergency room today with  an event that occurred around 8:30 a.m.  This occurred after she had  already gotten up out of bed, had gotten ready for the day.  The patient  noted onset of zigzag lines  in a vertical orientation that were bright white.  This visual event  lasted only a few moments, and then the patient developed a right  frontal headache that came on relatively rapidly.  The patient had  associated nausea with this, but no true neck stiffness.  The patient  reports no focal numbness or weakness on the face, arms, legs, questions  that there may be some tingling sensations around the mouth, denies any  slurred speech or any of permanent visual changes.  The vision has  returned to normal.  The patient does have photophobia at this time.  The patient underwent a CT scan of the head that showed no acute  changes.  No subarachnoid blood.  There is a mild degree of small vessel  ischemic changes including a small infarct in the head of the caudate on  the left.  The patient is seen by neurology at this point for further  evaluation.   PAST MEDICAL HISTORY:  1.  Onset of right frontal headache as above with zigzag lines at      onset.  2. History of tachybrady syndrome with cardiac pacer placement.  3. Atrial fibrillation history.  4. History of dyslipidemia.  5. Osteoporosis.  6. Degenerative arthritis.  7. Status post hysterectomy.  8. Coronary artery disease status post stent.  9. History of hypertension.   CURRENT MEDICATIONS:  1. Isordil 30 mg daily.  2. Amiodarone 100 mg a day.  3. Aspirin 325 mg day.  4. Miacalcin nasal spray b.i.d.  5. Lipitor 10 mg daily.  6. Zetia 10 mg day.  7. Caltrate daily.  8. The patient has multivitamins, 1 tablet daily.   THE PATIENT STATES AN ALLERGY TO:  1. PENICILLIN.  2. HAS HAD A REACTION TO CRAB MEAT IN THE PAST AROUND THE TIME SHE WAS      MARRIED MANY YEARS AGO.   Does not smoke cigarettes.  Drinks alcohol on occasion.   SOCIAL HISTORY:  This  patient is widowed, lives in the [place] area, lives with her son who has cerebral palsy.  The patient  is retired.   REVIEW OF SYSTEMS:  Notable in that the patient has no recent fevers, is  feeling a bit chilly today, has some slight chest tightness today,  denies any shortness of breath, neck pain, denies troubles controlling  the bowels or bladder, did have two bowel movements after the headache  today.  The patient denies any gait disturbance, numbness or weakness on  the arms or legs.  Two months ago, the patient states she had an episode  of somewhat cloudy mentation that eventually cleared, denies any  syncopal events, however.   FAMILY MEDICAL HISTORY:  Notable in that both parents died with  congestive heart failure.  The patient has two brothers that have passed  away, one with heart disease, one had severe arthritis.  The patient has  one sister who is alive and well.   PHYSICAL EXAMINATION:  VITAL SIGNS:  Blood pressure is 185/85, heart  rate 68, respiratory 20, temperature afebrile.  GENERAL:  This patient is a  fairly well-developed, elderly, white female  who is alert and cooperative at the time examination.  HEENT:  Head is atraumatic.  Eyes:  Pupils are round, react to light.  Disks are flat bilaterally.  NECK:  Supple.  No carotid bruits noted.  RESPIRATORY:  Clear.  CARDIOVASCULAR:  Reveals a regular rate and rhythm.  No obvious murmurs,  rubs noted.  EXTREMITIES:  Without significant edema.  NEUROLOGIC:  Cranial nerves:  As above.  Facial symmetry is present.  The patient has good sensation of face to pinprick, soft touch  bilaterally, has good strength of facial muscle, muscles of head-  turning, and shoulder shrug bilaterally.  Speech is well enunciated, not  aphasic.  Extraocular movements are full.  Visual fields are full.  Motor testing reveals 5/5 strength in all fours.  Good symmetric motor  tone is noted throughout.  Sensory testing is intact to pinprick, soft  touch, vibratory sensation throughout.  The patient has good finger-to-  nose-finger, and toe-to-finger bilaterally.  Gait was not tested.  Deep  tendon reflexes are symmetric.  Toes neutral bilaterally.  No drift is  seen in the upper extremities.   LABORATORY VALUES:  At this time notable for a white count of 10,  hemoglobin of 16.1, hematocrit 57.2, MCV of 93.5, platelets of 268.  Sodium 139, potassium 4.4, chloride of 103, CO2 of 31, glucose of 153,  BUN of 24, creatinine of 1.06.  Total bili 1.1, alkaline phosphatase 74,  SGOT 31, SGPT of 40, total protein 6.8, albumin 3.6, calcium 9.4.  CK-MB  fraction 3.1.  Troponin-I less than 0.05.  Urinalysis reveals cloudy  urine, specific gravity of 1.019, protein 30 mg/dL, 0-2 white cells, 3-6  red cells.  Sed rate is pending.  CT of the head is as above.   IMPRESSION:  1. New onset of headache, reminiscent of migraine, rule out star-burst      headache.  2. History of coronary artery disease.  3. History of pacer placement.  4. History of atrial fibrillation.   This  patient clearly has risk factors for stroke events, but the event  today was associated with a positive visual phenomenon with geometric  figures, zigzag lines.  This event is most consistent with a migrainous  event, not with stroke or TIA.  Given the lack of history of migraine in  an elderly person, we will pursue a bit further workup at this point.   PLAN:  1. Check sed rate on blood work.  This is pending.  2. CT angiogram of the intra and extra cranial vessels.  3. Continue aspirin.  If the above studies are unremarkable, will      consider discharge to home today.  4. I will need to give the patient IV fluid hydration before and after      the CT angiogram.  5. Will pre-medicate with Benadryl 50 mg IV, given the history of      allergy to crab meat.   DIAGNOSIS:  Code 346.11.      Marlan Palau, M.D.  Electronically Signed     CKW/MEDQ  D:  04/09/2006  T:  04/09/2006  Job:  [id number]   cc:   [facility]  Hal T. Stoneking, M.D.  Peter M. [place], M.D.

## 2010-06-05 NOTE — Op Note (Signed)
[name], [name]                        ACCOUNT NO.:  [id number]   MEDICAL RECORD NO.:  [id number]                   PATIENT TYPE:  OIB   LOCATION:  6527                                 FACILITY:  [facility]   PHYSICIAN:  Francisca December, M.D.               DATE OF BIRTH:  Feb 27, 1918   DATE OF PROCEDURE:  05/08/2002  DATE OF DISCHARGE:                                 OPERATIVE REPORT   PROCEDURE PERFORMED:  1. Insertion of dual chamber permanent transvenous pacemaker.  2. Left subclavian venogram.   INDICATIONS FOR PROCEDURE:  The patient is an 77 year old woman with  paroxysmal atrial fibrillation.  She is currently treated with Sotalol for  prevention of symptomatic episodes.  This has caused a profound bradycardia  and the patient has had intermittent fatigue related to this.  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   DESCRIPTION OF PROCEDURE:  The patient was brought to the cardiac  catheterization laboratory in a fasted state.  The left prepectoral region  was prepped and draped in the usual sterile fashion.  Local anesthesia was  obtained with infiltration of 1% lidocaine with epinephrine throughout the  left prepectoral region.  A left subclavian venogram was performed with a  peripheral injection of 20mL of Omnipaque.  A digital cine angiogram was  obtained and word mapped to guide future left subclavian puncture.  The  venogram did demonstrate the vein to be patent and coursing in a normal  fashion over the anterior surface of the first rib and beneath the middle  third of the clavicle.  There was no evidence for persistence of the left  superior vena cava.  A 6 to 7 cm incision was then made in the deltopectoral  groove and this was carried down by sharp dissection and electrocautery to  the  prepectoral fascia.  There a plane was lifted and a pocket formed  inferiorly immediately, utilizing blunt dissection and electrocautery.  The  pocket was then packed with a 1% Kanamycin soaked gauze.  Two separate left  subclavian punctures were then performed utilizing an 18 gauge thin-walled  needle through which was passed a 0.038 inch guidewire.  Fluoroscopic and  angiographic landmarks were utilized to guide the puncture.  Over the  initial guidewire, a 7 French tear-away sheath and dilator were advanced.  The wire and dilator were removed.  The ventricular lead was advanced to the  level of the right atrium.  The sheath was torn away.  Using standard  technique and fluoroscopic landmarks, the lead was manipulated in the right  ventricular apex.  There, excellent pacing parameters were obtained as will  be noted below.  The lead was tested for diaphragmatic pacing at 10V and  none was found.  The lead was then sutured into place using three separate 0  silk ligatures. The figure-of-eight hemostasis suture was placed around the  subclavian wire entry site using a 0 silk ligature.  Over the remaining  guidewire, a 9 French tear-away sheath and dilator were advanced.  The wire  was allowed to remain in place.  The dilator was removed and the atrial lead  was advanced to the level of the right atrium.  The lead was torn away.  Again using standard technique and fluoroscopic landmarks, the lead was  manipulated into the right atrial appendage.  There excellent pacing  parameters were obtained  as will be noted below.  The lead was tested for  diaphragmatic pacing at 10V and none was found.  The lead was then sutured  into place using three separate  0 silk ligatures.  The remaining guidewire  was removed along with the Kanamycin soaked gauze from the pocket.  The  pocket was copiously irrigated using 1% Kanamycin solution.  The pocket was  inspected for bleeding and none was found.  The  leads were then attached to  the pacing generator carefully identifying each lead by its serial number  and placing each near the appropriate atrial or ventricular receptacle.  Each lead was carefully tightened into place.  The leads were then wound  beneath the pacing generator was placed in the pocket.  An anchoring suture  was applied using a 0 silk ligature.  The pocket was then closed utilizing 2-  0 Dexon in a running fashion for the subcutaneous layer.  The skin was  approximated 5-0 Dexon in a running subcuticular fashion.  Steri-Strips and  a sterile dressing were applied and the patient was transported to the  recovery area in an A pace, V sense mode.   Equipment data:  The pacing generator is a Medtronic model number [id number],  serial number [id number].  The atrial lead is a Medtronic model number [id number],  serial number [id number].  The ventricular lead is a Medtronic model number  [id number], serial number [id number].   Pacing data:  The atrial lead detected a 2.43mV P wave.  The pacing threshold  was 0.6V at 0.5 ms pulse width.  The impedance was 647 ohms, resulting in a  current of 0.9 ma at capture threshold.  The ventricular lead detected at  10.4mV R wave. The pacing threshold was 0.4V at 0.5 ms pulse width.  The  impedance ws 683 ohms resulting in a current of 0.37mA at capture threshold.                                               Francisca December, M.D.    JHE/MEDQ  D:  05/08/2002  T:  05/09/2002  Job:  [id number]   cc:   Hal T. Stoneking, M.D.  [address] [place], [place] [address]  Fax: [phone]

## 2010-06-05 NOTE — Cardiovascular Report (Signed)
[name], [name]                        ACCOUNT NO.:  [id number]   MEDICAL RECORD NO.:  [id number]                   PATIENT TYPE:  OIB   LOCATION:  2899                                 FACILITY:  [facility]   PHYSICIAN:  Francisca December, M.D.               DATE OF BIRTH:  March 06, 1918   DATE OF PROCEDURE:  02/05/2002  DATE OF DISCHARGE:                              CARDIAC CATHETERIZATION   PROCEDURES PERFORMED:  1. Percutaneous coronary transluminal rotational atherectomy.  2. Drug-eluting stent implantation, mid and proximal left anterior     descending.   INDICATIONS:  The patient is an 77 year old woman who presented to my office  with the complaints of worsening dyspnea.  A myocardial perfusion study was  undertaken with pharmacologic stress that revealed a reversible lateral and  anterior wall defect extending from near the apex to the midportion of the  LV.  A diagnostic coronary angiogram revealed the presence of a subtotal stenosis  in the mid LAD as well as high-grade calcific disease in the proximal LAD.  She is brought now to the catheterization laboratory for percutaneous  rotational arthrectomy and subsequent adjunctive stent implantation in the  mid and proximal LAD.   DESCRIPTION OF PROCEDURE:  The patient was brought to the cardiac  catheterization laboratory in the postabsorptive state.  The right groin was  prepped and draped in the usual sterile fashion.  Local anesthesia was  obtained with the infiltration of 1% lidocaine. An 8 French catheter sheath  was inserted percutaneously into the right femoral artery utilizing an  anterior approach over a guiding J wire.  However, because of significant  calcification in the iliac artery system, I was unable to advance the  guiding catheter.  Therefore the 8 [place] short sheath was removed over a  long guiding J wire and a long 23 cm catheter sheath was inserted.  This did  facilitate the advancing of the guiding  catheter.  As well, I was unable to  place a femoral venous line on the right side.  Therefore, the left groin  was prepped and draped and following local anesthesia, a 6 French catheter  sheath was inserted percutaneously into the left femoral vein for  intravenous access.  An 8 [place] #3.5 Q guiding catheter was advanced to the  ascending aorta where the left coronary os was engaged.  The patient  received 3500 units of heparin intravenously, as well as double bolus of  Integrilin in constant infusion.  After cineangiograms obtained for guiding  road map, a 0.009 rotafloppy guide wire was advanced into the distal LAD  without extensive difficulty.  Rotational arthrectomy was then performed  using a 1.25 followed by a 1.5 followed by a 1.75 mm bur.  The 1.25 and 1.5  cm burs were used throughout the mid and proximal LAD.  A 1.75 bur was used  only in the proximal LAD.  The rotafloppy  wire was then exchanged for a  0.014 Scimed luge intracoronary guide wire with the use of  Cordis transient  catheter.  An ACT had been obtained and found to be 358 seconds.  This was  prior to the initiation of the rotablator therapy.  The mid LAD was then  stented using a 2.5/13 Cordis Cypher drug-eluting stent.  This was  positioned carefully and deployed at a peak pressure of 13 atmospheres for  46 seconds.  The stent balloon was removed and a 2.5/18 mm Cypher was then  positioned in the mid and proximal LAD.  It was deployed there at a peak  pressure of 12 atmospheres for 40 seconds.  Finally, a 3.5/8 mm Cypher was  positioned in the proximal LAD and deployed there at a peak pressure of 12  atmospheres.  These maneuvers resulted in wide patency of the anterior  descending artery.  This was confirmed in orthogonal views both with and  without the guide wire in place. The guiding catheter was removed because of  hematoma formation and persistent oozing around the stent site, the long, 8  French sheath was  exchanged over a long guiding wire or a 9 [place] short  sheath.  This did result in adequate hemostasis. It was sutured into place  and the patient was transported to the recovery area in stable condition  with an intact distal pulse.   ANGIOGRAPHY:  As mentioned, the lesion treated was in the mid and proximal  LAD.  Following balloon dilatation and stent implantation, there is no  visible residual stenosis with the exception of about 10% eccentric calcific  plaque in the proximal LAD. The stented segment did extend from the junction  of the mid and distal to the proximal LAD with overlapping throughout.   FINAL IMPRESSION:  1. Atherosclerotic coronary vascular disease, single-vessel.  2. Status post successful percutaneous transluminal coronary recanalization     angioplasty with drug-eluting stent implantation x3 in the mid and distal     anterior descending artery.  3. Typical angina was not reproduced with device insertion or balloon     inflation.                                                   Francisca December, M.D.    JHE/MEDQ  D:  02/05/2002  T:  02/05/2002  Job:  [id number]   cc:   Hal T. Stoneking, M.D.  [address]  [place], [place] [address]  Fax: [phone]   Cardiac Catheterization Laboratory

## 2010-06-05 NOTE — Cardiovascular Report (Signed)
   [name], [name]                        ACCOUNT NO.:  [id number]   MEDICAL RECORD NO.:  [id number]                   PATIENT TYPE:  OIB   LOCATION:  2899                                 FACILITY:  [facility]   PHYSICIAN:  Francisca December, M.D.               DATE OF BIRTH:  [birth year]   DATE OF PROCEDURE:  DATE OF DISCHARGE:                              CARDIAC CATHETERIZATION   ADDENDUM:  It should be noted that my procedure was complex and difficult.  It involved an extensive length of the anterior descending artery, which was  heavily calcified from proximal to distal portion.  Three different  rotablator burs were required with lengthy exchanges.  The distal aorta and  proximal iliacs were heavily calcified requiring extensive manipulation to  place the guiding catheter.  Following rotational arthrectomy, the entire  mid and proximal portion of the anterior descending artery required  stenting.  Three separate devices. Wire exchanges were required between  rotablator and stenting procedures.  The entire procedure from start to  finish required 2-1/4 hours.                                               Francisca December, M.D.    JHE/MEDQ  D:  02/05/2002  T:  02/05/2002  Job:  [id number]
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
## 2010-06-05 NOTE — H&P (Signed)
[name], [name] NO.:  [id number]   MEDICAL RECORD NO.:  [id number]                   PATIENT TYPE:  ORB   LOCATION:  4527                                 FACILITY:  [facility]   PHYSICIAN:  Jackie Plum, M.D.             DATE OF BIRTH:  October 25, 1918   DATE OF ADMISSION:  05/13/2003  DATE OF DISCHARGE:                                HISTORY & PHYSICAL   Please see Discharge Summary from acute hospital dictated today, May 13, 2003, for further insights into the patient's presenting symptoms, signs,  and evaluation at time of admission.   The patient was admitted to acute hospital for intractable pain due to  gluteal hematoma.  She had been in the acute hospital since May 06, 2003,  for this.  During this period, Coumadin had been discontinued after  consultation with cardiology.  The patient is currently maintained on  amiodarone for this arrhythmia.  She had some nausea believed to be related  to amiodarone, and this is being controlled with antiemetics; i.e., Zofran  and Phenergan.   The patient is deconditioned and, therefore, she is being transferred to  [facility] for further restorative therapy.   For Past Medical History, please see admission H&P by Dr. Theressa Millard  dictated May 06, 2003, for further information in this regard.   ALLERGIES:  The patient is allergic to PENICILLIN.   MEDICATIONS ON ADMISSION:  1. Amiodarone 200 mg p.o. daily.  2. Lipitor 40 mg p.o. q.h.s.  3. Colace 100 mg p.o. daily.  4. Zetia 10 mg p.o. daily.  5. Duragesic patch 25 mg patch q.74h.  6. Imdur 30 mg p.o. daily.  7. MiraLax 15 g p.o. daily.  8. Senokot 1 tablet p.o. b.i.d.  9. Tylenol p.r.n.  10.      Lortab p.r.n.  11.      Lactulose 30 mg p.r.n.  12.      Ativan p.r.n.  13.      Maalox p.r.n.  14.      Morphine sulfate 1 to 2 mg IV q.3h. p.r.n.  15.      Zofran 4 mg IV q.4h. p.r.n.  16.      Phenergan 12.5 mg IV q.4-6h. p.r.n.   PAST MEDICAL  HISTORY:  1. History of CAD.  2. Tachycardia-bradycardia syndrome status post pacemaker placement.  3. Atrial fibrillation (uses Rythmol).  4. Osteoarthritis.  5. Chronic anemia.   On rounds today, [name] feels quite well.  There is no acute distress.  She denies any fever or chills.  Nausea is improved with antiemetics.  Blood  pressure 120/70, pulse rate 72, respiratory rate 20, temperature 98.3  degrees F. O2 saturation 98% on room air.  Lungs clear to auscultation.  Cardiac exam did not reveal any gallop.  Abdomen soft, nontender, and she  does not have any edema of extremities.  Hemoglobin done today is 12.3,  hematocrit 35.1, and she is deemed appropriate for transfer to subacute unit  for continued restorative therapy for her deconditioning, continued bed rest  from intractable pain.  The patient was referred to [facility].                                                Jackie Plum, M.D.    GO/MEDQ  D:  05/13/2003  T:  05/13/2003  Job:  [id number]

## 2010-06-05 NOTE — Discharge Summary (Signed)
NAMESHAQUEENA, [name]                        ACCOUNT NO.:  [id number]   MEDICAL RECORD NO.:  [id number]                   PATIENT TYPE:  INP   LOCATION:  6526                                 FACILITY:  [facility]   PHYSICIAN:  Francisca December, M.D.               DATE OF BIRTH:  05-22-1918   DATE OF ADMISSION:  02/05/2002  DATE OF DISCHARGE:  02/07/2002                                 DISCHARGE SUMMARY   [facility] ACCOUNT NUMBER:  [id number]   ADMISSION DIAGNOSES:  1. Atherosclerotic cardiovascular disease, single vessel left anterior     descending, 75 to 80%.  2. NYHA class II angina pectoris.  3. History of atrial fibrillation on systemic Coumadin anticoagulation.  4. Hyperlipidemia.   DISCHARGE DIAGNOSES:  1. Atherosclerotic cardiovascular disease, single vessel disease, left     anterior descending, status post percutaneous coronary intervention.  2. Right groin hematoma - improved.  3. Blood loss anemia, treated with transfusion.  4. Hypotension, resolved.   HISTORY OF PRESENT ILLNESS:  The patient is an 77 year old white female,  mother of Dr. Newell Coral, who presented to Dr. Arminda Resides office with complaints  of worsening dyspnea. A myocardial perfusion study was undertaken with  pharmacologic stress with revealed a lateral and anterior wall reversible  defect extending from near the apex to the mid portion of the LV. 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 Because of  persistent symptoms and risks for myocardial infarction, plans were made for  elective cardiac catheterization, percutaneous rotational atherectomy, and  adjunctive stent placement by Dr. Amil Amen.  The rest of the procedure was  reviewed and the patient agreed to proceed.   PROCEDURE:  Cardiac catheterization with PTCA and stenting of the LAD lesion  from 80 to 0% using three Cypher stents.   COMPLICATIONS:  Level I groin hematoma, hypotension.   CONSULTATIONS:  None.   HOSPITAL COURSE:  The patient was admitted to [facility] on February 05, 2002, for elective intervention to the LAD.  Preprocedure laboratory  studies showed a hemoglobin of 14.1, platelet count 273.  BUN 25 and  creatinine 1.1.  Cardiac catheterization with intervention to the LAD was  performed by Dr. Amil Amen.  It was a complex and difficult procedure  involving extensive length of left anterior descending which was heavily  calcified from proximal to distal portion.  Three different rotablator burs  were required with lengthy exchanges.  Following rotational atherectomy, the  entire mid and proximal portion of left anterior descending required  stenting.  A total of three Cypher stents were placed, 2.5 x 13 mm, 2.5 x 18  mm, and 3.5 x 8 mm tandemly reducing the 80% lesion to 0%. The patient  tolerated the procedure well. 9 French sheath was in place at the ending of  the case.  She did develop a level I groin hematoma post procedure, but this  remained stable.   In the early morning hours of February 06, 2002, the patient became  hypotensive, nauseated, and had a brief episode of second degree heart block  which was nonsustained and resolved.  The patient was treated with  Trendelenburg, IV fluid hydration, and Atropine with improvement in blood  pressure to the low 100s and heart rate to the 80s or 90s.  Hemoglobin on  February 06, 2002, was 10.7, potassium 3.5.  This was repleted.  Cardiac  rehab evaluated the patient on February 06, 2002, and held off on ambulation  until the following day.   On February 07, 2002, the patient had no complaints of chest pain.  Blood  pressure was improved at 120/34, pulse 80.  Pentolol was restarted.  Coumadin was started at 1.25 mg a day.  Hemoglobin was found to be low at  7.6 with a hematocrit of 22.6, and platelet count of 155.  This was repeated  and  hemoglobin came back at 7.0. For this reason, Dr. Amil Amen recommended  transfusion of one unit of packed red blood cells prior to discharge home.  The patient was agreeable. She was asymptomatic prior to transfusion with no  lightheadedness or dizziness despite low hemoglobin.  Her potassium was  repleted again for potassium of 3.5.  The patient will be maintained on low  dose Coumadin therapy with a goal INR of 1.5 to 2.0 for the next couple of  months. She will be on aspirin and Plavix for three months because of Cypher  stents.   At present, the unit of blood is being transfused.  This remains stable and  if she is fine post transfusion, she will be discharged to home.   DISCHARGE MEDICATIONS:  1. Enteric-coated aspirin 81 mg a day.  2. Plavix 75 mg a day for three months.  3. Coumadin 1.25 mg a day.  4. Nu-Iron 150 mg b.i.d. until further notice. This may cause constipation     and may need stool softener.  5. Pravachol 40 mg a day.  6. Pentolol 5 mg b.i.d.  7. Nitroglycerin 0.4 mg under the tongue as needed for chest pain.   ACTIVITY:  As tolerated.  No strenuous activity or driving for two days. She  is to take it easy until seen back.   DIET:  Low salt, fat, low cholesterol diet.   She may shower.  She is to call the office for questions, including  increased pain, swelling, or bleeding at the groin site.   FOLLOW UP:  With Dr. Amil Amen for Thursday, February 7, at 9:15.  She has a  appointment [facility] for January 28, at 2:15 and a CBC follow-up  at that time. She is given new prescriptions for Nu-Iron, Plavix, and  nitroglycerin.     Georgiann Cocker Jernejcic, P.A.                   Francisca December, M.D.    TCJ/MEDQ  D:  02/07/2002  T:  02/08/2002  Job:  [id number]   cc:   Hal T. Stoneking, M.D.  [address], [place] [address]  Fax: [phone]

## 2010-06-05 NOTE — Consult Note (Signed)
[name], [name]                        ACCOUNT NO.:  [id number]   MEDICAL RECORD NO.:  [id number]                   PATIENT TYPE:  EMS   LOCATION:  MINO                                 FACILITY:  [facility]   PHYSICIAN:  Lesleigh Noe, M.D.            DATE OF BIRTH:  1919-01-13   DATE OF CONSULTATION:  02/19/2002  DATE OF DISCHARGE:                                   CONSULTATION   REASON FOR CONSULTATION:  Dyspnea, nausea, and weakness.   CONCLUSION:  1. Dyspnea, weakness, and some chest tightness probably relieved with     sublingual nitroglycerin, possibly secondary to angina.  2. Lightheadedness and weakness secondary to nitroglycerin use.   PLAN:  1. Discharge home.  2. Clinical follow-up with Dr. Amil Amen in two to three days.  3. Nitroglycerin if recurrent dyspnea or chest complaints.  4. Return to the emergency room if recurrent symptoms.   COMMENT:  The patient is 77 years of age and underwent LAD rotational  atherectomy on 02/05/02.  She had three drug-eluting stents placed in the LAD  by Dr. Amil Amen.  She has been well since that time.  She had one episode  three days following discharge from the hospital where she used her  nitroglycerin when she suddenly developed some shortness of breath while  walking in the cold.  This afternoon while relaxing at home after eating  lunch, there was then sensation of shortness of breath and perhaps some mild  discomfort in the chest.  This was relieved with sublingual nitroglycerin.  After she used the nitroglycerin, she felt somewhat queasy and lightheaded.  She came to the office with this, where the EKG did not reveal any  significant changes when compared to the prior tracing.  She went home but  then decided to come to the emergency room because she was still not feeling  quite right and was anxious about whether or not this represented a problem.  She currently feels quite well.  She states she has had no discomfort  since  shortly after lunch.   MEDICATIONS:  Coumadin, Aspirin, Plavix, Nu-Iron, Pravachol, Pindolol 5 mg  p.o. b.i.d., and sublingual nitroglycerin.   PHYSICAL EXAMINATION:  VITAL SIGNS:  Blood pressure 116/82, heart rate 65.  NECK:  Neck veins not distended.  Neck exam reveals no JVD or carotid  bruits.  CHEST:  Clear.  CARDIAC:  S4 gallop.  No murmur.  ABDOMEN:  Soft.  EXTREMITIES:  No edema.  The right groin reveals no significant ecchymosis.  A small callus is noted.  NEUROLOGIC:  Normal.   LABORATORY DATA:  CK-MB and troponin I are normal.   EKG #1 revealed frequent PACs and nonspecific ST-T abnormality.  #2 revealed  Q-wave inversion V3 through V6, I, and aVL, similar to EKG post PCI.    ASSESSMENT:  1. Dyspnea, possibly anginal, relieved after one sublingual nitroglycerin,     discomfort lasting  less than 20 minutes.  2. Dizziness secondary to nitroglycerin.   PLAN:  1. Discharge home.  2. Clinical observation.  3. If recurrent chest discomfort or need for nitroglycerin, she should     return immediately to the emergency room; otherwise, follow up with Dr.     Amil Amen on 02/22/00.                                               Lesleigh Noe, M.D.    HWS/MEDQ  D:  02/19/2002  T:  02/20/2002  Job:  [id number]   cc:   Francisca December, M.D.  [address]  [address]  [place]  [place] [address]  Fax: [phone]   Hal T. Stoneking, M.D.  [address] [place], [place] [address]  Fax: [phone]

## 2010-06-05 NOTE — Discharge Summary (Signed)
NAMECESILY, CUOCO                          ACCOUNT NO.:  [id number]   MEDICAL RECORD NO.:  [id number]                   PATIENT TYPE:  ORB   LOCATION:  4528                                 FACILITY:  [facility]   PHYSICIAN:  Jackie Plum, M.D.             DATE OF BIRTH:  [birth year]   DATE OF ADMISSION:  05/13/2003  DATE OF DISCHARGE:  05/18/2003                                 DISCHARGE SUMMARY   DISCHARGE DIAGNOSES:  1. Deconditioning, status post restorative therapy at the subacute unit -     improved significantly.  2. Intractable pain due to gluteal hematoma resolved.  3. History of chronic atrial fibrillation, was on Coumadin which has been     discontinued for now.  Status post pacemaker placement.  4. Persistent nausea and constipation, presumed secondary to amiodarone,     improved.  5. History of chronic atrial fibrillation, status post pacemaker placement.     A. Coumadin discontinued during this hospitalization.  6. History of coronary artery disease.  7. History of __________ syndrome, status post pacemaker placement in April     of 2004.  8. History of chronic anemia.  9. History of osteoarthritis.  10.      History of allergy to penicillin.   DISCHARGE MEDICATIONS:  The patient is going to resume all her preadmission  medications except for the following changes:  1. The patient has been discontinued from Coumadin.  2. The dose of her amiodarone has been decreased from 400 mg p.o. daily to     200 mg p.o. daily.  3. Colace 100 mg p.o. daily has been added to her home regimen.   Home medications include:  1. Zetia 10 mg p.o. daily.  2. Lipitor 15 mg p.o. daily.  3. Aspirin 81 mg p.o. daily.   LABORATORY DATA:  WBC count 9.9, hemoglobin 12.8, hematocrit 38.2, MCV 91.2,  platelets 392.  Sodium 132, potassium 3.9, chloride 100, CO2 29, glucose  139, BUN 17, creatinine 1.0, calcium 8.9.   ACTIVITY:  As tolerated.   DIET:  Cardiac diet.   DISCHARGE  INSTRUCTIONS:  The patient has been told to drink lots of fluid  including juice.  She is to report to M.D. if she has any problems  including, but not limited to, nausea, chest pain, breathing difficulty.   FOLLOW UP:  Follow up with her primary cardiologist, Francisca December, M.D.  in two weeks, she is to call for appointment.  Follow up with Hal T.  Stoneking, M.D. for routine medical followup.   REASON FOR ADMISSION:  Please see the discharge summary for acute hospital  dictated on the same day as H&P admission to the subacute unit on May 13, 2003.  The patient was admitted to the hospital for intractable pain due to  gluteal hematoma.  During hospitalization she has had complaints of  disabling nausea with constipation.  The nausea is currently  improved  somewhat after having dose of Coumadin from 400 mg daily to 200 mg daily.  During the hospitalization, the decision was made to discontinue the  patient's Coumadin by her cardiologist, Dr. Amil Amen.  The patient was  currently transferred to subacute unit for restorative therapy and does have  mild deconditioning related to her hospitalization.   During her subacute unit stay, the patient was followed by PT and OT with  nutritional evaluation and __________ as well as restorative nursing care.  Her symptoms of nausea were finally controlled with Zofran and the patient  has been doing well without any significant nausea for the last 24 hours on  p.o. Zofran.  She has been ambulatory on the subacute unit without any  significant problems and she is deemed appropriate for discharge today.  The  patient's plan of care was discussed briefly with her family, Hewitt Shorts, M.D. from phone conversation, during which time all her questions  were answered.  The patient is deemed appropriate for discharge today for  outpatient followup.  At this point, the patient's nausea and constipation  which have improved is likely secondary to  her amiodarone and she will need  a follow up with her primary cardiologist to consider possibly discontinuing  this medication to another antiarrhythmic medicine if deemed appropriate and  necessary at the time based on her symptomatic control with Zofran.   COMPLICATIONS:  Not applicable.   PROCEDURE:  Not applicable.   CONDITION ON DISCHARGE:  Improved.                                                Jackie Plum, M.D.    GO/MEDQ  D:  05/18/2003  T:  05/18/2003  Job:  [id number]   cc:   Francisca December, M.D.  [address]  [address]  [place]  [place] [address]  Fax: [phone]   Hal T. Stoneking, M.D.  [address] [place], [place] [address]  Fax: [phone]

## 2010-06-05 NOTE — Cardiovascular Report (Signed)
   [name], [name]                        ACCOUNT NO.:  [id number]   MEDICAL RECORD NO.:  [id number]                   PATIENT TYPE:  INP   LOCATION:  2905                                 FACILITY:  [facility]   PHYSICIAN:  Francisca December, M.D.               DATE OF BIRTH:  [birth year]   DATE OF PROCEDURE:  05/23/2002  DATE OF DISCHARGE:                              CARDIAC CATHETERIZATION   PROCEDURES PERFORMED:  1. Left heart catheterization.  2. Coronary angiography.  3. Left ventriculogram.  4. Intravascular ultrasound, left anterior descending artery.   CARDIOLOGIST:  Francisca December, M.D.   INDICATIONS:  The patient is an 77 year old woman who is approximately  status post PTCRA and stent of the left anterior descending artery.  These  were drug-eluting devices.  Approximately 40 mm of artery were stented.  She  does have intermittent atypical angina.  Yesterday, she was admitted with  prolonged chest and jaw pain/discomfort.  A myocardial infarction has been  ruled out.  Her ECG is nondiagnostic due to resting baseline abnormalities.  This is due to LVH.  She is brought now to the catheterization laboratory to  identify possible recurrent or progressive disease and etiology.   DESCRIPTION OF PROCEDURE:  The patient was brought to the cardiac  catheterization laboratory in the fasting state.  She is on chronic Coumadin  because of paroxysmal atrial fibrillation.  An INR this morning was 2.4  despite being 1.6 yesterday.  She received two units of intravenous fresh  frozen plasma.  Heparin was discontinued. On call to the catheterization  laboratory.  Her initial ACT was 215 seconds.  A 5 French catheter sheath  was inserted percutaneously into the right femoral artery over a guiding J-  wire utilizing an anterior approach.  This artery was heavily calcified and  the sheath passage required the use of a 5 and 6 [place] dilator.  A 110 cm  pigtail catheter was used to  measure pressures in the ascending aorta and in  the left ventricle both prior to and following the ventriculogram.  A 30  degree RAO cine left ventriculogram was performed utilizing a power  injector.    CORONARY ANGIOGRAPHY:   Dictation ends here                                                Francisca December, M.D.    JHE/MEDQ  D:  05/23/2002  T:  05/24/2002  Job:  [id number]   cc:   Hal T. Stoneking, M.D.  [address]  [place], [place] [address]  Fax: [phone]   Cardiac Catheterization Laboratory

## 2010-06-05 NOTE — Consult Note (Signed)
NAMELAURANN, MCMORRIS NO.:  [id number]   MEDICAL RECORD NO.:  [id number]          PATIENT TYPE:  EMS   LOCATION:  MAJO                         FACILITY:  [facility]   PHYSICIAN:  Peter M. Swaziland, M.D.  DATE OF BIRTH:  Apr 26, 1918   DATE OF CONSULTATION:  05/08/2005  DATE OF DISCHARGE:  05/08/2005                                   CONSULTATION   HISTORY OF PRESENT ILLNESS:  Mrs. Cunanan is a very pleasant 77 year old  white female with history of coronary disease and tachybrady syndrome who  presented to the emergency room today for evaluation of tachypalpitations  and chest tightness. The patient is status post coronary stents x2 in 2004.  She presented at that time with predominant symptoms of dyspnea. She also  has had a longstanding history of arrhythmias, has been diagnosed with  tachybrady syndrome and atrial fibrillation. She had been tried on numerous  medications in the past, subsequently had a pacemaker placed in 2004. She  has now been on chronic amiodarone therapy. After lunch today the patient  states she just did not feel well. She noted her heart racing with a pulse  of 100, with her normal pulse around 60. It was associated mild chest  tightness and lightheadedness. She had no shortness of breath. This lasted  approximately 30 minutes then resolved. She came to the emergency room to be  evaluated and she is currently without complaints.   PAST MEDICAL HISTORY:  1.  Tachybrady syndrome with atrial fibrillation.  2.  Coronary disease status post coronary stents x2.  3.  Hypercholesterolemia.  4.  Osteoporosis.  5.  Status post hysterectomy.  6.  History of significant soft tissue bleed in her products requiring 2-      unit transfusion while on Coumadin in the past.   MEDICATIONS:  1.  Isosorbide mononitrate 30 mg per day.  2.  Amiodarone 100 mg per day.  3.  Aspirin 325 mg per day.  4.  Miacalcin nasal spray b.i.d.  5.  Lipitor 10 mg per day.  6.   Zetia 10 mg per day.  7.  Caltrate daily.  8.  Multivitamin daily.   ALLERGIES:  PENICILLIN.   SOCIAL HISTORY:  She lives at home. She remains active. Her son, Molly Maduro, is  a Midwife here.   FAMILY HISTORY:  Noncontributory.   REVIEW OF SYSTEMS:  Otherwise unremarkable. The patient has been feeling  well up until today. One of her other sons is currently hospitalized.   PHYSICAL EXAMINATION:  GENERAL:  A pleasant elderly white female in no  distress.  VITAL SIGNS:  Blood pressure is 142/57, pulse 61 in sinus rhythm.  Saturations were 100%.  HEENT:  Normocephalic, atraumatic. Pupils equal, round, reactive to light  and accommodation. Sclerae clear. Oropharynx clear. NECK:  Without JVD,  adenopathy, thyromegaly or bruits.  LUNGS:  Clear.  CARDIAC:  Reveals regular rate and rhythm without murmur, rub, gallop or  click.  ABDOMEN:  Soft, nontender. Bowel sounds positive.  EXTREMITIES:  Without edema.  NEUROLOGIC:  Intact.   LABORATORY DATA:  Coags are normal.  Hemoglobin is 14.7; white count 7300;  platelets 210,000. Sodium 137, potassium 4.9, chloride 109, CO2 28, BUN 26,  creatinine 1.1, glucose 128. Point of care cardiac enzymes are negative x1.  ECG shows normal sinus rhythm with old inferior infarction. There is LVH  with repolarization abnormality. There is no change compared to 2004. Chest  x-ray shows mild cardiomegaly with no active disease. Interrogation of her  pacemaker device today demonstrates she is in sinus rhythm. She has  excellent battery voltage and lead impedances are normal. She did have an  episode of atrial tachycardia/fibrillation at 12:28 today lasting 25  minutes. She did receive anti-tachycardia pacing and subsequently broke to  normal sinus rhythm. Her ventricular rate during this episode was  approximately 100 beats per minute.   IMPRESSION:  1.  Tachybrady syndrome with episode of atrial fibrillation today, now      converted to sinus rhythm.  The patient was symptomatic but had good rate      control. Her pacemaker functioned appropriately.  2.  Coronary disease.  3.  Hypercholesterolemia.   PLAN:  Will discharge to home. Will continue her current medications. Will  arrange follow-up with Dr. Amil Amen and copy pacemaker interrogation to him.  Discharge status is stable.           ______________________________  Peter M. [place], M.D.     PMJ/MEDQ  D:  05/08/2005  T:  05/10/2005  Job:  [id number]   cc:   Francisca December, M.D.  Fax: [phone]   Hal T. Stoneking, M.D.  Fax: [phone]

## 2010-06-05 NOTE — Discharge Summary (Signed)
[name], [name]                        ACCOUNT NO.:  [id number]   MEDICAL RECORD NO.:  [id number]                   PATIENT TYPE:  INP   LOCATION:  2905                                 FACILITY:  [facility]   PHYSICIAN:  Francisca December, M.D.               DATE OF BIRTH:  [birth year]   DATE OF ADMISSION:  05/22/2002  DATE OF DISCHARGE:  05/24/2002                                 DISCHARGE SUMMARY   [facility] Account No. [id number].   ADMISSION DIAGNOSES:  1. Unstable angina, rule out myocardial infarction.  2. Single-vessel coronary artery disease.  3. Status post permanent transvenous pacemaker for tachybrady syndrome.  4. Paroxysmal atrial fibrillation, chronic Coumadin anticoagulation.  5. Hypertension.  6. Hyperlipidemia.   DISCHARGE DIAGNOSES:  1. Unstable angina, chest pain resolved, myocardial infarction ruled out     with negative enzymes.  Cardiac catheterization revealing stable coronary     artery disease, medical therapy.  2. Single-vessel coronary artery disease.  3. Status post permanent transvenous pacemaker for tachybrady syndrome.  4. Paroxysmal atrial fibrillation, chronic Coumadin anticoagulation.  5. Hypertension.  6. Hyperlipidemia.   HISTORY OF PRESENT ILLNESS:  The patient is an 77 year old widowed white  female, a patient of Dr. Amil Amen, with single-vessel CAD and a recent  permanent transvenous pacemaker implantation for sick sinus  syndrome/tachybrady syndrome.   The patient had been in her usual state of health until last evening.  She  had pork and carrots for dinner.  Had some postprandial indigestion.  Had  difficulty sleeping.  Around midnight she developed chest tightness with  tremendous bilateral jaw discomfort and belching, about a 6/10.  Discomfort  persisted; fell asleep around 2 a.m.  Awoke at 7 a.m. with persistent  discomfort greater than 6/10, worrisome.  She spoke with her daughter-in-  law, who advised sublingual  nitroglycerin.  She took one without much  relief.  She took a second and then a third, again without much relief in  symptoms. Daughter-in-law brought her to the emergency room.  On arrival the  discomfort had intensified to about a 9/10.  IV nitroglycerin was started  and the patient had eased to about a 6/10.  Occasionally there is transient  increase in discomfort.   In the past her angina has been primarily shortness of breath and dyspnea on  exertion.  This is a new symptom complex.   EKG in the emergency room shows A-paced rhythm with 1 mm ST elevation in II,  aVF, V2, and V4.  A 1.5-2 mm ST elevation in III and V3.  T-wave inversion  in I and aVL, V5 to V6.  Incomplete right bundle branch block.  LVH.  Chest  x-ray showed no active disease.   Labs are pending.   The EKG was markedly abnormal, faxed to Dr. Amil Amen at the office for  review.  She has an abnormal EKG at baseline.  She will  be admitted for rule  out MI and will consider cardiac catheterization.  Will need to hold  Coumadin.  Will start IV heparin and IV nitroglycerin and continue aspirin  and Plavix.   PROCEDURES:  1. Cardiac catheterization by Dr. Amil Amen on 05/23/02.   COMPLICATIONS:  None.   CONSULTATIONS:  Cardiac rehab.   HOSPITAL COURSE:  The patient was admitted to [facility] on 05/22/02  with symptoms of unstable angina.  She was treated with IV heparin and IV  nitroglycerin.  INR on admission was subtherapeutic at 1.6.  She was given a  total of 4 mg of morphine in the ER with a nitroglycerin drip at  6 and  continued to have some discomfort in the jaw; however, her affect showed her  to be in no acute distress.   Cardiac enzymes were negative x3.  A decision was made by Dr. Amil Amen to  perform a diagnostic cardiac catheterization in the morning of 05/23/02.   On 05/23/02 her INR was 2.4.  She was typed and crossed and transfused with  two units of fresh frozen plasma.  She was taken to the  catheterization lab  by Dr. Amil Amen.  This revealed normal LV function with an EF of 70%.  She  did have LVH and 2+ MR.  Left main was okay.  LAD had a 30-50% mid-in-stent  restenosis and 70-80% diagonal lesion.  The circumflex and RCA had no  significant obstructions.  IVUS was performed and showed a 2.0-2.7 mm lumen  throughout the LAD area.  Dr. Amil Amen felt that there was no clear coronary  obstruction to explain her symptoms.  He recommended continued medical  therapy and consideration of GI evaluation if she had further discomfort.   The sheath was pulled without difficulty.  There was no hematoma or  ecchymosis, and hemodynamics remained stable.   The patient was seen by cardiac rehab.   On 05/24/02 the patient continued to be hemodynamically stable.  Of note,  during the cardiac catheterization and the evening prior, the patient had  gone into atrial fibrillation.  She was placed on IV amiodarone in hopes  that she would convert to sinus rhythm.  She remained rate-controlled but  did not convert.  At discharge Dr. Amil Amen recommended discontinuation of  the amiodarone therapy and continuation of sotalol and continuation of  Coumadin.   INR was 2.4 on 05/24/02.  Right groin stable.   The patient will be discharged to home this morning in stable condition.   DISCHARGE MEDICATIONS:  1. Coumadin 1.25 mg a day, 2.5 mg on Tuesday and Thursday.  2. Sotalol 80 mg b.i.d.  3. Plavix 75 mg a day.  4. Enteric-coated aspirin 81 mg a day.  5. Lipitor 20 mg at bedtime.  6. Miacalcin nasal spray.  7. Nitroglycerin to use for chest pain.  8. Protonix 40 mg a day, which is a new prescription.   DISCHARGE INSTRUCTIONS:  1. No strenuous activity, lifting over five pounds, or driving for two days.  2. A low-fat, low-cholesterol, low-salt diet.  3. May shower.  Should not soak in the tub for a week.  4. She is asked to call the office if any problems or questions. 5. On Tuesday, 05/29/02, at  9:30 in the morning, the patient will see Loma Newton for an INR check.  She will have an EKG done at that time, and     Dr. Amil Amen will see her.  Georgiann Cocker Jernejcic, P.A.                   Francisca December, M.D.    TCJ/MEDQ  D:  05/24/2002  T:  05/25/2002  Job:  [id number]

## 2010-06-05 NOTE — H&P (Signed)
[name], VAUGHAN NO.:  [id number]   MEDICAL RECORD NO.:  [id number]                   PATIENT TYPE:  INP   LOCATION:  3704                                 FACILITY:  [facility]   PHYSICIAN:  Theressa Millard, M.D.                 DATE OF BIRTH:  04/01/1918   DATE OF ADMISSION:  05/05/2003  DATE OF DISCHARGE:                                HISTORY & PHYSICAL   HISTORY OF PRESENT ILLNESS:  Ms. Linarez is a very nice 77 year old white  female admitted for pain control for a gluteal hematoma and for chest  discomfort.   She has a history of atrial fibrillation and coronary artery disease.  She  had a single-vessel intervention in January of 2004 because of shortness of  breath.  She had another cath in May of 2004 for chest discomfort that was  unchanged.  In the interim, she had atrial fibrillation with tachybrady  syndrome and a permanent pacemaker was placed.  She has been on Coumadin.   On April 24, 2003, she had a meeting at her home and she spent some time in  the days prior to that cleaning her home, etc, etc, preparing for the  meeting.  On April 26, 2003, she developed pain in the right buttock.  The  pain was quite severe and she ultimately saw Dr. Elana Alm. Thurston Hole on April 29, 2003.  X-rays at that time were negative for arthritis.  She was  diagnosed with a pulled muscle and treated with heat and physical therapy.  She never did make it to physical therapy because of how bad the pain was.  Over the past week, the pain has worsened considerably and on May 03, 2003, she was noted to have a hematoma of the buttock.  Her son talked with  Dr. Ann Maki T. Stoneking on May 03, 2003, in the evening, and she was started  on Vicodin.  An INR and CBC were done on May 04, 2003 with an INR of 1.9  and hemoglobin of 10.4.  Over the last 2 days, the pain has worsened to  where she can hardly bear weight and she has been nauseated.  Bruising  extended to  behind the knee and down the lower leg.  Tonight, she had some  chest discomfort.  They called and I directed them to the emergency  department.  In the emergency department tonight, her hemoglobin is 9.2.  She is admitted for pain control, physical therapy and rule-out of  myocardial infarction.  Of note is the fact that her coronary artery disease  has never had chest discomfort in the past as the symptoms.   PAST MEDICAL HISTORY:   MEDICAL:  1. Coronary artery disease:  See above.  2. Tachybrady syndrome, status post permanent pacemaker, April 2004.  3. Osteoarthritis the spine.  4. Anemia secondary to metrorrhagia many, many years ago.  SURGICAL:  Hysterectomy for benign reasons.   ALLERGIES:  PENICILLIN.   MEDICATIONS:  1. Coumadin 1.25 mg daily.  2. Zetia 10 mg daily.  3. Amiodarone 200 mg daily.  4. Lipitor 50 mg daily.  5. Aspirin 81 mg daily.   SOCIAL HISTORY:  Cigarettes:  None.  Alcohol:  None.  She is widowed.  She  is the mother of Dr. Hewitt Shorts.   FAMILY HISTORY:  Family history noncontributory.   REVIEW OF SYSTEMS:  Some constipation since starting pain medications.  Her  hemoglobin in January of 2004 was 10.7 but she has never known about any  recent anemia.   PHYSICAL EXAMINATION:  GENERAL:  Well-developed, well-nourished, in no  distress.  VITAL SIGNS:  Pulse 72 and irregular, blood pressure 140/70.  HEENT:  The eyes have pupils which are round and reactive to light  The  extraocular movements are intact.  Ears are normal.  Nose and throat are  unremarkable.  NECK:  Neck is supple.  Thyroid is not enlarged or tender.  CHEST:  Chest is clear to auscultation and percussion.  CARDIAC:  Exam has a irregular rhythm and rate with a normal S1 and S2,  without S3, murmur or click.  ABDOMEN:  The abdomen is soft and nontender.  EXTREMITIES:  Extremities reveal bruising of the right buttock and down  behind the right knee and down the lateral right  lower leg to a level just  above the ankle.   LABORATORY DATA:  Hemoglobin 9.2 with a normal MCV.  INR is pending.   BMET is normal.   EKG shows atrial fibrillation with some T wave abnormalities in the lateral  leads.   CT scan shows a large hematoma of the right buttock, no retroperitoneal  hemorrhage.   IMPRESSION:  1. Buttock hematoma with nausea secondary to pain or pain medications.  2. Acute-on-chronic anemia.  3. Anticoagulation, systemic.  4. Atrial fibrillation.  5. Coronary artery disease; I doubt her chest pain is secondary to this.  6. Pacemaker.  7. Constipation.   PLAN:  The patient will be admitted to a monitored bed.  We will follow INR  and CBC.  Hold Coumadin until we are sure her hemoglobin is stable.                                                Theressa Millard, M.D.    JO/MEDQ  D:  05/06/2003  T:  05/06/2003  Job:  [id number]

## 2010-06-11 ENCOUNTER — Ambulatory Visit
Admission: RE | Admit: 2010-06-11 | Discharge: 2010-06-11 | Disposition: A | Discharge status: Home / Self Care | Payer: Medicare Other | Source: Ambulatory Visit | Attending: Geriatric Medicine | Admitting: Geriatric Medicine

## 2010-06-11 DIAGNOSIS — Z1231 Encounter for screening mammogram for malignant neoplasm of breast: Secondary | ICD-10-CM

## 2010-10-07 LAB — BASIC METABOLIC PANEL
BUN: 19
Calcium: 9
Creatinine, Ser: 1.04
GFR calc Af Amer: >60
GFR calc non Af Amer: 48 — ABNORMAL LOW
GFR calc non Af Amer: 50 — ABNORMAL LOW
Potassium: 4.3
Sodium: 142

## 2010-10-07 LAB — DIFFERENTIAL
Basophils Relative: 0
Eosinophils Absolute: 0.3
Lymphs Abs: 2.1
Neutro Abs: 6.6
Neutrophils Relative %: 70

## 2010-10-07 LAB — CBC
Platelets: 230
RBC: 4.57
WBC: 9.5

## 2010-10-07 LAB — CARDIAC PANEL(CRET KIN+CKTOT+MB+TROPI)
CK, MB: 4.3 — ABNORMAL HIGH
Relative Index: INVALID
Total CK: 96
Troponin I: 0.06

## 2010-10-07 LAB — TSH: TSH: 7.56 — ABNORMAL HIGH

## 2010-10-07 LAB — TROPONIN I: Troponin I: 0.08 — ABNORMAL HIGH

## 2010-10-07 LAB — POCT CARDIAC MARKERS
Myoglobin, poc: 108
Operator id: 198171

## 2010-10-19 ENCOUNTER — Other Ambulatory Visit (HOSPITAL_COMMUNITY): Payer: Self-pay | Admitting: Orthopedic Surgery

## 2010-10-19 DIAGNOSIS — M51369 Other intervertebral disc degeneration, lumbar region without mention of lumbar back pain or lower extremity pain: Secondary | ICD-10-CM

## 2010-10-19 DIAGNOSIS — S82209A Unspecified fracture of shaft of unspecified tibia, initial encounter for closed fracture: Secondary | ICD-10-CM

## 2010-10-19 DIAGNOSIS — M5136 Other intervertebral disc degeneration, lumbar region: Secondary | ICD-10-CM

## 2010-10-28 ENCOUNTER — Ambulatory Visit (HOSPITAL_COMMUNITY)
Admission: RE | Admit: 2010-10-28 | Discharge: 2010-10-28 | Disposition: A | Discharge status: Home / Self Care | Payer: Medicare Other | Source: Ambulatory Visit | Attending: Orthopedic Surgery | Admitting: Orthopedic Surgery

## 2010-10-28 ENCOUNTER — Encounter (HOSPITAL_COMMUNITY)
Admission: RE | Admit: 2010-10-28 | Discharge: 2010-10-28 | Disposition: A | Discharge status: Home / Self Care | Payer: Medicare Other | Source: Ambulatory Visit | Attending: Orthopedic Surgery | Admitting: Orthopedic Surgery

## 2010-10-28 DIAGNOSIS — M5136 Other intervertebral disc degeneration, lumbar region: Secondary | ICD-10-CM

## 2010-10-28 DIAGNOSIS — S82209A Unspecified fracture of shaft of unspecified tibia, initial encounter for closed fracture: Secondary | ICD-10-CM

## 2010-10-28 DIAGNOSIS — M51379 Other intervertebral disc degeneration, lumbosacral region without mention of lumbar back pain or lower extremity pain: Secondary | ICD-10-CM | POA: Insufficient documentation

## 2010-10-28 DIAGNOSIS — M79609 Pain in unspecified limb: Secondary | ICD-10-CM | POA: Insufficient documentation

## 2010-10-28 DIAGNOSIS — M25559 Pain in unspecified hip: Secondary | ICD-10-CM | POA: Insufficient documentation

## 2010-10-28 DIAGNOSIS — M5137 Other intervertebral disc degeneration, lumbosacral region: Secondary | ICD-10-CM | POA: Insufficient documentation

## 2010-10-28 DIAGNOSIS — Z950 Presence of cardiac pacemaker: Secondary | ICD-10-CM | POA: Insufficient documentation

## 2010-10-28 MED ORDER — TECHNETIUM TC 99M MEDRONATE IV KIT
24.0000 | PACK | Freq: Once | INTRAVENOUS | Status: AC | PRN
  Administered 2010-10-28: 24 via INTRAVENOUS

## 2010-11-02 LAB — PROTIME-INR
INR: 1
Prothrombin Time: 13.6

## 2010-11-02 LAB — POCT I-STAT CREATININE
Creatinine, Ser: 1.3 — ABNORMAL HIGH
Operator id: 285841

## 2010-11-02 LAB — I-STAT 8, (EC8 V) (CONVERTED LAB)
Chloride: 109
Glucose, Bld: 149 — ABNORMAL HIGH
HCT: 43
Potassium: 4
pH, Ven: 7.539 — ABNORMAL HIGH

## 2010-11-02 LAB — POCT CARDIAC MARKERS: Troponin i, poc: <0.05

## 2011-01-20 DIAGNOSIS — Z950 Presence of cardiac pacemaker: Secondary | ICD-10-CM | POA: Diagnosis not present

## 2011-02-15 DIAGNOSIS — R7301 Impaired fasting glucose: Secondary | ICD-10-CM | POA: Diagnosis not present

## 2011-03-05 DIAGNOSIS — L578 Other skin changes due to chronic exposure to nonionizing radiation: Secondary | ICD-10-CM | POA: Diagnosis not present

## 2011-03-05 DIAGNOSIS — L259 Unspecified contact dermatitis, unspecified cause: Secondary | ICD-10-CM | POA: Diagnosis not present

## 2011-03-08 DIAGNOSIS — H3530 Unspecified macular degeneration: Secondary | ICD-10-CM | POA: Diagnosis not present

## 2011-03-08 DIAGNOSIS — Z961 Presence of intraocular lens: Secondary | ICD-10-CM | POA: Diagnosis not present

## 2011-03-16 DIAGNOSIS — R112 Nausea with vomiting, unspecified: Secondary | ICD-10-CM | POA: Diagnosis not present

## 2011-05-21 ENCOUNTER — Other Ambulatory Visit: Payer: Self-pay | Admitting: Geriatric Medicine

## 2011-05-21 DIAGNOSIS — Z1231 Encounter for screening mammogram for malignant neoplasm of breast: Secondary | ICD-10-CM

## 2011-05-24 DIAGNOSIS — E7800 Pure hypercholesterolemia, unspecified: Secondary | ICD-10-CM | POA: Diagnosis not present

## 2011-05-24 DIAGNOSIS — L659 Nonscarring hair loss, unspecified: Secondary | ICD-10-CM | POA: Diagnosis not present

## 2011-05-24 DIAGNOSIS — E039 Hypothyroidism, unspecified: Secondary | ICD-10-CM | POA: Diagnosis not present

## 2011-06-16 DIAGNOSIS — J40 Bronchitis, not specified as acute or chronic: Secondary | ICD-10-CM | POA: Diagnosis not present

## 2011-06-23 ENCOUNTER — Ambulatory Visit
Admission: RE | Admit: 2011-06-23 | Discharge: 2011-06-23 | Disposition: A | Discharge status: Home / Self Care | Payer: Medicare Other | Source: Ambulatory Visit | Attending: Geriatric Medicine | Admitting: Geriatric Medicine

## 2011-06-23 DIAGNOSIS — Z1231 Encounter for screening mammogram for malignant neoplasm of breast: Secondary | ICD-10-CM

## 2011-06-29 DIAGNOSIS — R0602 Shortness of breath: Secondary | ICD-10-CM | POA: Diagnosis not present

## 2011-06-29 DIAGNOSIS — I2510 Atherosclerotic heart disease of native coronary artery without angina pectoris: Secondary | ICD-10-CM | POA: Diagnosis not present

## 2011-06-29 DIAGNOSIS — I4891 Unspecified atrial fibrillation: Secondary | ICD-10-CM | POA: Diagnosis not present

## 2011-06-29 DIAGNOSIS — I10 Essential (primary) hypertension: Secondary | ICD-10-CM | POA: Diagnosis not present

## 2011-07-26 DIAGNOSIS — Z950 Presence of cardiac pacemaker: Secondary | ICD-10-CM | POA: Diagnosis not present

## 2011-09-02 DIAGNOSIS — Z79899 Other long term (current) drug therapy: Secondary | ICD-10-CM | POA: Diagnosis not present

## 2011-09-02 DIAGNOSIS — Z0000 Encounter for general adult medical examination without abnormal findings: Secondary | ICD-10-CM | POA: Diagnosis not present

## 2011-09-02 DIAGNOSIS — Z1331 Encounter for screening for depression: Secondary | ICD-10-CM | POA: Diagnosis not present

## 2011-09-02 DIAGNOSIS — R51 Headache: Secondary | ICD-10-CM | POA: Diagnosis not present

## 2011-09-02 DIAGNOSIS — R7301 Impaired fasting glucose: Secondary | ICD-10-CM | POA: Diagnosis not present

## 2011-09-02 DIAGNOSIS — R32 Unspecified urinary incontinence: Secondary | ICD-10-CM | POA: Diagnosis not present

## 2011-09-02 DIAGNOSIS — M810 Age-related osteoporosis without current pathological fracture: Secondary | ICD-10-CM | POA: Diagnosis not present

## 2011-09-02 DIAGNOSIS — E7800 Pure hypercholesterolemia, unspecified: Secondary | ICD-10-CM | POA: Diagnosis not present

## 2011-09-22 DIAGNOSIS — M47817 Spondylosis without myelopathy or radiculopathy, lumbosacral region: Secondary | ICD-10-CM | POA: Diagnosis not present

## 2011-09-22 DIAGNOSIS — M4120 Other idiopathic scoliosis, site unspecified: Secondary | ICD-10-CM | POA: Diagnosis not present

## 2011-10-04 DIAGNOSIS — M810 Age-related osteoporosis without current pathological fracture: Secondary | ICD-10-CM | POA: Diagnosis not present

## 2011-10-14 ENCOUNTER — Other Ambulatory Visit: Payer: Self-pay | Admitting: Geriatric Medicine

## 2011-10-14 DIAGNOSIS — R51 Headache: Secondary | ICD-10-CM

## 2011-10-14 DIAGNOSIS — Z23 Encounter for immunization: Secondary | ICD-10-CM | POA: Diagnosis not present

## 2011-10-14 DIAGNOSIS — I129 Hypertensive chronic kidney disease with stage 1 through stage 4 chronic kidney disease, or unspecified chronic kidney disease: Secondary | ICD-10-CM | POA: Diagnosis not present

## 2011-10-18 ENCOUNTER — Ambulatory Visit
Admission: RE | Admit: 2011-10-18 | Discharge: 2011-10-18 | Disposition: A | Discharge status: Home / Self Care | Payer: Medicare Other | Source: Ambulatory Visit | Attending: Geriatric Medicine | Admitting: Geriatric Medicine

## 2011-10-18 DIAGNOSIS — R51 Headache: Secondary | ICD-10-CM | POA: Diagnosis not present

## 2011-10-18 MED ORDER — IOHEXOL 300 MG/ML  SOLN
75.0000 mL | Freq: Once | INTRAMUSCULAR | Status: AC | PRN
  Administered 2011-10-18: 75 mL via INTRAVENOUS

## 2011-10-19 DIAGNOSIS — E559 Vitamin D deficiency, unspecified: Secondary | ICD-10-CM

## 2011-10-19 HISTORY — DX: Vitamin D deficiency, unspecified: E55.9

## 2011-10-21 DIAGNOSIS — M810 Age-related osteoporosis without current pathological fracture: Secondary | ICD-10-CM | POA: Diagnosis not present

## 2011-10-21 DIAGNOSIS — R51 Headache: Secondary | ICD-10-CM | POA: Diagnosis not present

## 2011-11-01 DIAGNOSIS — M79609 Pain in unspecified limb: Secondary | ICD-10-CM | POA: Diagnosis not present

## 2011-11-01 DIAGNOSIS — N61 Mastitis without abscess: Secondary | ICD-10-CM | POA: Diagnosis not present

## 2011-11-04 DIAGNOSIS — Z961 Presence of intraocular lens: Secondary | ICD-10-CM | POA: Diagnosis not present

## 2011-11-04 DIAGNOSIS — H3530 Unspecified macular degeneration: Secondary | ICD-10-CM | POA: Diagnosis not present

## 2011-11-04 DIAGNOSIS — H52209 Unspecified astigmatism, unspecified eye: Secondary | ICD-10-CM | POA: Diagnosis not present

## 2011-11-10 DIAGNOSIS — I10 Essential (primary) hypertension: Secondary | ICD-10-CM | POA: Diagnosis not present

## 2011-11-10 DIAGNOSIS — N61 Mastitis without abscess: Secondary | ICD-10-CM | POA: Diagnosis not present

## 2011-11-12 DIAGNOSIS — R350 Frequency of micturition: Secondary | ICD-10-CM | POA: Diagnosis not present

## 2011-11-12 DIAGNOSIS — N3941 Urge incontinence: Secondary | ICD-10-CM | POA: Diagnosis not present

## 2011-11-12 DIAGNOSIS — R3915 Urgency of urination: Secondary | ICD-10-CM | POA: Diagnosis not present

## 2011-11-29 DIAGNOSIS — E7800 Pure hypercholesterolemia, unspecified: Secondary | ICD-10-CM | POA: Diagnosis not present

## 2011-11-29 DIAGNOSIS — Z79899 Other long term (current) drug therapy: Secondary | ICD-10-CM | POA: Diagnosis not present

## 2011-12-03 DIAGNOSIS — R0989 Other specified symptoms and signs involving the circulatory and respiratory systems: Secondary | ICD-10-CM | POA: Diagnosis not present

## 2011-12-15 DIAGNOSIS — B372 Candidiasis of skin and nail: Secondary | ICD-10-CM | POA: Diagnosis not present

## 2012-01-04 DIAGNOSIS — I4891 Unspecified atrial fibrillation: Secondary | ICD-10-CM | POA: Diagnosis not present

## 2012-01-04 DIAGNOSIS — I2510 Atherosclerotic heart disease of native coronary artery without angina pectoris: Secondary | ICD-10-CM | POA: Diagnosis not present

## 2012-01-04 DIAGNOSIS — I10 Essential (primary) hypertension: Secondary | ICD-10-CM | POA: Diagnosis not present

## 2012-01-24 DIAGNOSIS — Z950 Presence of cardiac pacemaker: Secondary | ICD-10-CM | POA: Diagnosis not present

## 2012-01-28 DIAGNOSIS — E7800 Pure hypercholesterolemia, unspecified: Secondary | ICD-10-CM | POA: Diagnosis not present

## 2012-01-28 DIAGNOSIS — Z79899 Other long term (current) drug therapy: Secondary | ICD-10-CM | POA: Diagnosis not present

## 2012-03-31 DIAGNOSIS — M47817 Spondylosis without myelopathy or radiculopathy, lumbosacral region: Secondary | ICD-10-CM | POA: Diagnosis not present

## 2012-03-31 DIAGNOSIS — M4120 Other idiopathic scoliosis, site unspecified: Secondary | ICD-10-CM | POA: Diagnosis not present

## 2012-06-05 DIAGNOSIS — E039 Hypothyroidism, unspecified: Secondary | ICD-10-CM | POA: Diagnosis not present

## 2012-06-05 DIAGNOSIS — E1129 Type 2 diabetes mellitus with other diabetic kidney complication: Secondary | ICD-10-CM | POA: Diagnosis not present

## 2012-06-05 DIAGNOSIS — I129 Hypertensive chronic kidney disease with stage 1 through stage 4 chronic kidney disease, or unspecified chronic kidney disease: Secondary | ICD-10-CM | POA: Diagnosis not present

## 2012-06-05 DIAGNOSIS — R32 Unspecified urinary incontinence: Secondary | ICD-10-CM | POA: Diagnosis not present

## 2012-06-26 DIAGNOSIS — I4891 Unspecified atrial fibrillation: Secondary | ICD-10-CM | POA: Diagnosis not present

## 2012-06-26 DIAGNOSIS — I2510 Atherosclerotic heart disease of native coronary artery without angina pectoris: Secondary | ICD-10-CM | POA: Diagnosis not present

## 2012-06-26 DIAGNOSIS — I10 Essential (primary) hypertension: Secondary | ICD-10-CM | POA: Diagnosis not present

## 2012-07-18 DIAGNOSIS — Z950 Presence of cardiac pacemaker: Secondary | ICD-10-CM | POA: Diagnosis not present

## 2012-07-18 DIAGNOSIS — I495 Sick sinus syndrome: Secondary | ICD-10-CM | POA: Diagnosis not present

## 2012-08-03 DIAGNOSIS — H52209 Unspecified astigmatism, unspecified eye: Secondary | ICD-10-CM | POA: Diagnosis not present

## 2012-08-03 DIAGNOSIS — Z961 Presence of intraocular lens: Secondary | ICD-10-CM | POA: Diagnosis not present

## 2012-08-03 DIAGNOSIS — H5200 Hypermetropia, unspecified eye: Secondary | ICD-10-CM | POA: Diagnosis not present

## 2012-08-09 ENCOUNTER — Other Ambulatory Visit: Payer: Self-pay

## 2012-08-09 DIAGNOSIS — Z1231 Encounter for screening mammogram for malignant neoplasm of breast: Secondary | ICD-10-CM

## 2012-08-24 ENCOUNTER — Ambulatory Visit
Admission: RE | Admit: 2012-08-24 | Discharge: 2012-08-24 | Disposition: A | Discharge status: Home / Self Care | Payer: Medicare Other | Source: Ambulatory Visit

## 2012-08-24 DIAGNOSIS — Z1231 Encounter for screening mammogram for malignant neoplasm of breast: Secondary | ICD-10-CM

## 2012-11-07 DIAGNOSIS — Z23 Encounter for immunization: Secondary | ICD-10-CM | POA: Diagnosis not present

## 2012-11-21 ENCOUNTER — Encounter: Payer: Self-pay | Admitting: Interventional Cardiology

## 2012-11-22 DIAGNOSIS — H903 Sensorineural hearing loss, bilateral: Secondary | ICD-10-CM | POA: Diagnosis not present

## 2012-11-22 DIAGNOSIS — H6120 Impacted cerumen, unspecified ear: Secondary | ICD-10-CM | POA: Diagnosis not present

## 2012-11-27 DIAGNOSIS — Z79899 Other long term (current) drug therapy: Secondary | ICD-10-CM | POA: Diagnosis not present

## 2012-11-27 DIAGNOSIS — Z1331 Encounter for screening for depression: Secondary | ICD-10-CM | POA: Diagnosis not present

## 2012-11-27 DIAGNOSIS — E7800 Pure hypercholesterolemia, unspecified: Secondary | ICD-10-CM | POA: Diagnosis not present

## 2012-11-27 DIAGNOSIS — Z0000 Encounter for general adult medical examination without abnormal findings: Secondary | ICD-10-CM | POA: Diagnosis not present

## 2012-12-06 ENCOUNTER — Ambulatory Visit (HOSPITAL_COMMUNITY): Payer: Medicare Other | Attending: Interventional Cardiology

## 2012-12-06 DIAGNOSIS — I6529 Occlusion and stenosis of unspecified carotid artery: Secondary | ICD-10-CM

## 2012-12-06 DIAGNOSIS — R002 Palpitations: Secondary | ICD-10-CM

## 2012-12-06 DIAGNOSIS — R0989 Other specified symptoms and signs involving the circulatory and respiratory systems: Secondary | ICD-10-CM | POA: Insufficient documentation

## 2012-12-07 ENCOUNTER — Encounter (HOSPITAL_COMMUNITY): Payer: Self-pay | Admitting: Interventional Cardiology

## 2012-12-22 ENCOUNTER — Encounter: Payer: Self-pay | Admitting: Internal Medicine

## 2012-12-22 DIAGNOSIS — I495 Sick sinus syndrome: Secondary | ICD-10-CM | POA: Insufficient documentation

## 2012-12-22 DIAGNOSIS — Z79899 Other long term (current) drug therapy: Secondary | ICD-10-CM | POA: Insufficient documentation

## 2012-12-22 DIAGNOSIS — Z950 Presence of cardiac pacemaker: Secondary | ICD-10-CM | POA: Insufficient documentation

## 2012-12-22 DIAGNOSIS — I10 Essential (primary) hypertension: Secondary | ICD-10-CM | POA: Insufficient documentation

## 2012-12-22 DIAGNOSIS — I4891 Unspecified atrial fibrillation: Secondary | ICD-10-CM | POA: Insufficient documentation

## 2012-12-22 DIAGNOSIS — I2510 Atherosclerotic heart disease of native coronary artery without angina pectoris: Secondary | ICD-10-CM | POA: Insufficient documentation

## 2012-12-22 DIAGNOSIS — E7800 Pure hypercholesterolemia, unspecified: Secondary | ICD-10-CM | POA: Insufficient documentation

## 2012-12-22 DIAGNOSIS — E039 Hypothyroidism, unspecified: Secondary | ICD-10-CM | POA: Insufficient documentation

## 2012-12-25 ENCOUNTER — Encounter: Payer: Self-pay | Admitting: Internal Medicine

## 2012-12-25 ENCOUNTER — Ambulatory Visit (INDEPENDENT_AMBULATORY_CARE_PROVIDER_SITE_OTHER): Payer: Medicare Other | Admitting: Internal Medicine

## 2012-12-25 VITALS — BP 144/76 | HR 65 | Ht <= 58 in | Wt 128.1 lb

## 2012-12-25 DIAGNOSIS — Z950 Presence of cardiac pacemaker: Secondary | ICD-10-CM

## 2012-12-25 DIAGNOSIS — I4891 Unspecified atrial fibrillation: Secondary | ICD-10-CM | POA: Diagnosis not present

## 2012-12-25 DIAGNOSIS — I495 Sick sinus syndrome: Secondary | ICD-10-CM

## 2012-12-25 NOTE — Patient Instructions (Addendum)
Needs battery replaced in Pacemaker  We have called your son and he is going to call Dr Johney Frame back after surgery  Call Anselm Pancoast [phone] after you talk things over with him

## 2012-12-25 NOTE — Progress Notes (Signed)
[name] Otto, MD: Primary Cardiologist:  Dr Eldridge Dace  [name] is a 79 y.o. female with a h/o tachycardia/ bradycardia and atrial fibrillation sp PPM (MDT) by Dr Amil Amen who presents today to establish care in the Electrophysiology device clinic.  She initially underwent PPM implant in 2004.  She underwent generator change by Dr Amil Amen with a MDT Enrhythm device in 2011. The patient reports doing very well since having a pacemaker implanted and remains very active despite her age.   Today, she  denies symptoms of palpitations, chest pain, shortness of breath, orthopnea, PND, lower extremity edema, dizziness, presyncope, syncope, or neurologic sequela.  The patientis tolerating medications without difficulties and is otherwise without complaint today.   Past Medical History  Diagnosis Date  . Pure hypercholesterolemia   . Hyperlipidemia     Tolerating medication well as of 11/27/12  . Hypothyroidism   . Carpal tunnel syndrome   . Benign hypertensive heart disease without heart failure   . Coronary atherosclerosis of native coronary artery   . Spinal stenosis of lumbar region   . Osteopenia   . Postsurgical percutaneous transluminal coronary angioplasty status   . Essential hypertension, benign   . Benign hypertensive kidney disease with chronic kidney disease stage I through stage IV, or unspecified(403.10)   . Diabetes mellitus with chronic kidney disease   . NSTEMI (non-ST elevated myocardial infarction) 09/21/09    BM stent RCA  . Atrial fibrillation   . Tachy-brady syndrome     s/p PPM, battery change 08/01/09  . Sinoatrial node dysfunction   . Chronic anemia   . Osteoarthritis   . H/O echocardiogram 07/19/09    EF = 60-65%  . Hyperglycemia     Mild  . Microalbuminuria 02/2010  . Vitamin D deficiency 10/2011  . Chronic renal disease, stage III    Past Surgical History  Procedure Laterality Date  . Carpal tunnel release Right 02/07/09  . Vesicovaginal fistula  closure w/ tah    . Oophorectomy      With hysterectomy. One ovary removed-fibroids.  . Pacemaker insertion  2004    Gen change 08/04/09 by Dr. Amil Amen. New pacing generator is a Medtronic EnRhythm, model D1939726, serial [id number]  . Pacemaker generator change  08/04/09    By Dr. Amil Amen. New pacing generator is a Medtronic EnRhythm, model D1939726, serial [id number]  . Percutaneous coronary stent intervention (pci-s)  05/08/02    (3 tandem Cypher stent), proximal and mid LAD  . Coronary angioplasty with stent placement  09/21/09    By Dr. Amil Amen. ACS, NSTEMI - BM stent RCA    History   Social History  . Marital Status: Widowed    Spouse Name: N/A    Number of Children: 3  . Years of Education: N/A   Occupational History  .     Social History Main Topics  . Smoking status: Never Smoker   . Smokeless tobacco: Not on file  . Alcohol Use: Yes     Comment: occasional glass of wine  . Drug Use: No  . Sexual Activity: Not on file   Other Topics Concern  . Not on file   Social History Narrative  . No narrative on file    Family History  Problem Relation Age of Onset  . Heart disease Mother   . Heart disease Father   . Diabetes Father   . Peripheral vascular disease Brother   . Cerebral palsy Son     Allergies  Allergen Reactions  .  Penicillins   . Sulfa Antibiotics     Current Outpatient Prescriptions  Medication Sig Dispense Refill  . aspirin 325 MG tablet Take 325 mg by mouth daily.      Marland Kitchen diltiazem (CARDIZEM CD) 120 MG 24 hr capsule Take 120 mg by mouth daily.      Marland Kitchen dronedarone (MULTAQ) 400 MG tablet Take 400 mg by mouth 2 (two) times daily with a meal.      . ezetimibe (ZETIA) 10 MG tablet Take 10 mg by mouth daily.      Marland Kitchen levothyroxine (SYNTHROID) 50 MCG tablet Take 25 mcg by mouth daily before breakfast.      . nitroGLYCERIN (NITROSTAT) 0.4 MG SL tablet Place 0.4 mg under the tongue every 5 (five) minutes as needed for chest pain.      . rosuvastatin  (CRESTOR) 5 MG tablet Take 5 mg by mouth daily.       No current facility-administered medications for this visit.    ROS- all systems are reviewed and negative except as per HPI  Physical Exam: Filed Vitals:   12/25/12 1505  BP: 144/76  Pulse: 65  Height: 4\' 2"  (1.27 m)  Weight: 128 lb 1.9 oz (58.115 kg)    GEN- The patient is elderly but well appearing, alert and oriented x 3 today.   Head- normocephalic, atraumatic Eyes-  Sclera clear, conjunctiva pink Ears- hearing intact Oropharynx- clear Neck- supple, no JVP Lymph- no cervical lymphadenopathy Lungs- Clear to ausculation bilaterally, normal work of breathing Chest- pacemaker pocket is well healed Heart- Regular rate and rhythm, no murmurs, rubs or gallops, PMI not laterally displaced GI- soft, NT, ND, + BS Extremities- no clubbing, cyanosis, or edema MS- no significant deformity or atrophy Skin- no rash or lesion Psych- euthymic mood, full affect Neuro- strength and sensation are intact  Pacemaker interrogation- reviewed in detail today,  See PACEART report  Assessment and Plan:  1. Tachycardia/ bradycardia The patient is s/p PPM.  Her last generator change was in 2011.  Unfortunately, she has reached premature ERI which has been observed and is well reported with the MDT Enrhythm device.  I have spoken with the patient and her son (a physician in our community) at length today about the implications of the Enrhythm battery issue.  Risks, benefits, and alternatives to PPM generator replacement were discussed at length with the patient and her son (by phone).  They would prefer to follow conservatively at this time.  As the battery has only 3 years of use at this point, I think that this is reasonable.  I have reprogrammed to MVPR today.  She will return in 3 months for follow-up.  It is expected that 90 days from reaching ERI that her device will automatically display EOL.  There is no urgency to replace her device at this  time.  2. afib Controlled I have spoken with the patient, Dr Eldridge Dace, and also the patients son.  They are all in agreement that due to prior spontaneous bleeding that oral anticoagulant therapy will not be used at this time.  Given results of AVERROES, eliquis may be an option for this patient.  I will defer this decision to Dr Eldridge Dace long term.  Return in 3 months

## 2013-01-01 LAB — MDC_IDC_ENUM_SESS_TYPE_INCLINIC
Brady Statistic AS VP Percent: 10.8 %
Brady Statistic AS VS Percent: 60.6 %
Lead Channel Pacing Threshold Amplitude: 0.5 V
Lead Channel Pacing Threshold Amplitude: 1 V
Lead Channel Pacing Threshold Pulse Width: 0.6 ms
Lead Channel Sensing Intrinsic Amplitude: 2.7 mV

## 2013-02-06 ENCOUNTER — Ambulatory Visit: Payer: Medicare Other | Admitting: Interventional Cardiology

## 2013-02-08 ENCOUNTER — Ambulatory Visit (INDEPENDENT_AMBULATORY_CARE_PROVIDER_SITE_OTHER): Payer: Medicare Other | Admitting: Interventional Cardiology

## 2013-02-08 ENCOUNTER — Encounter: Payer: Self-pay | Admitting: Interventional Cardiology

## 2013-02-08 VITALS — BP 140/90 | HR 67 | Ht <= 58 in | Wt 131.4 lb

## 2013-02-08 DIAGNOSIS — E7800 Pure hypercholesterolemia, unspecified: Secondary | ICD-10-CM | POA: Diagnosis not present

## 2013-02-08 DIAGNOSIS — I4891 Unspecified atrial fibrillation: Secondary | ICD-10-CM | POA: Diagnosis not present

## 2013-02-08 DIAGNOSIS — I495 Sick sinus syndrome: Secondary | ICD-10-CM

## 2013-02-08 DIAGNOSIS — I10 Essential (primary) hypertension: Secondary | ICD-10-CM

## 2013-02-08 NOTE — Progress Notes (Signed)
Patient ID: [name], female   DOB: 1918-09-16, 80 y.o.   MRN: [id number]    [address] [place], [place]  [address] Phone: [phone] Fax:  [phone]  Date:  02/08/2013   ID:  [name], DOB [birth year], MRN [id number]  PCP:  Mathews Argyle, MD      History of Present Illness: [name] is a 80 y.o. female who has had tachybrady syndrome, CAD. In the past few months, her HR has been well controlled. She does report constipation and some diarrhea. She has not been walking as much. She stopped yoga class. She has some issues with fatigue. She gets tired shopping. She only goes with someone. Atrial Fibrillation F/U:  Denies : Chest pain.  Dizziness.  Leg edema.  Orthopnea.  Palpitations.  Shortness of breath.  Syncope.  No longer driving.    Wt Readings from Last 3 Encounters:  02/08/13 131 lb 6.4 oz (59.603 kg)  12/25/12 128 lb 1.9 oz (58.115 kg)     Past Medical History  Diagnosis Date  . Pure hypercholesterolemia   . Hyperlipidemia     Tolerating medication well as of 11/27/12  . Hypothyroidism   . Carpal tunnel syndrome   . Benign hypertensive heart disease without heart failure   . Coronary atherosclerosis of native coronary artery   . Spinal stenosis of lumbar region   . Osteopenia   . Postsurgical percutaneous transluminal coronary angioplasty status   . Essential hypertension, benign   . Benign hypertensive kidney disease with chronic kidney disease stage I through stage IV, or unspecified   . Diabetes mellitus with chronic kidney disease   . NSTEMI (non-ST elevated myocardial infarction) 09/21/09    BM stent RCA  . Atrial fibrillation   . Tachy-brady syndrome     s/p PPM, battery change 08/01/09  . Sinoatrial node dysfunction   . Chronic anemia   . Osteoarthritis   . H/O echocardiogram 07/19/09    EF = 60-65%  . Hyperglycemia     Mild  . Microalbuminuria 02/2010  . Vitamin D deficiency 10/2011  . Chronic renal  disease, stage III     Current Outpatient Prescriptions  Medication Sig Dispense Refill  . aspirin 325 MG tablet Take 325 mg by mouth daily.      Marland Kitchen diltiazem (CARDIZEM CD) 120 MG 24 hr capsule Take 120 mg by mouth daily.      Marland Kitchen dronedarone (MULTAQ) 400 MG tablet Take 400 mg by mouth 2 (two) times daily with a meal.      . ezetimibe (ZETIA) 10 MG tablet Take 10 mg by mouth daily.      Marland Kitchen levothyroxine (SYNTHROID) 50 MCG tablet Take 25 mcg by mouth daily before breakfast.      . nitroGLYCERIN (NITROSTAT) 0.4 MG SL tablet Place 0.4 mg under the tongue every 5 (five) minutes as needed for chest pain.      . rosuvastatin (CRESTOR) 5 MG tablet Take 5 mg by mouth daily.       No current facility-administered medications for this visit.    Allergies:    Allergies  Allergen Reactions  . Penicillins   . Sulfa Antibiotics     Social History:  The patient  reports that she has never smoked. She does not have any smokeless tobacco history on file. She reports that she drinks alcohol. She reports that she does not use illicit drugs.   Family History:  The patient's family history includes Cerebral palsy  in her son; Diabetes in her father; Heart disease in her father and mother; Peripheral vascular disease in her brother.   ROS:  Please see the history of present illness.  No nausea, vomiting.  No fevers, chills.  No focal weakness.  No dysuria.    All other systems reviewed and negative.   PHYSICAL EXAM: VS:  BP 140/90  Pulse 67  Ht 4' 2" (1.27 m)  Wt 131 lb 6.4 oz (59.603 kg)  BMI 36.95 kg/m2 Well nourished, well developed, in no acute distress HEENT: normal Neck: no JVD, no carotid bruits Cardiac:  normal S1, S2; RRR; 2/6 systolic murmur Lungs:  clear to auscultation bilaterally, no wheezing, rhonchi or rales Abd: soft, nontender, no hepatomegaly Ext: no edema Skin: warm and dry Neuro:   no focal abnormalities noted  EKG:  NSR, lateral T wave inversion; no change     ASSESSMENT AND  PLAN:  Essential hypertension, benign  Notes: Controlled today. Can have the nurse at [facility] check. If readings are above 140/90 consistently, we can adjust medications. Call us with results.    2. CAD in native artery  Continue Aspirin Tablet, 325 MG, 1 tablet, Orally, Once a day Notes: No angina. Exercise limited due to back pain and fatigue.    3. A-fib  Continue Multaq tablet, 400 mg, 1 tablet, orally, Twice daily Notes: No palpitaitons. she is no longer on anticoagulation. Continue aspirin.  Prior h/o significant bleed on Coumadin several years ago.  Normal liver tests in 11/14.  Normal TSH in 5/14.  Followed by Dr. Felipa Eth.     4. Pacer: Will need to f/u with Dr. Rayann Heman.  May need a generator change fairly soon. Preventive Medicine  Adult topics discussed:  Exercise: 5 days a week, at least 30 minutes of aerobic exercise; can try exercise bike. Try whatever exercise her back tolerates.. Continue yoga and tai-chi   Follow Up      Signed, Mina Marble, MD, [facility] 02/08/2013 2:06 PM

## 2013-02-08 NOTE — Patient Instructions (Signed)
Your physician wants you to follow-up in: 8 months with Dr. Eldridge DaceVaranasi. You will receive a reminder letter in the mail two months in advance. If you don't receive a letter, please call our office to schedule the follow-up appointment.  Your physician recommends that you continue on your current medications as directed. Please refer to the Current Medication list given to you today.  Your physician has requested that you regularly monitor and record your blood pressure readings at home (Check at least once a week). Please use the same machine at the same time of day to check your readings and record them to bring to your follow-up visit. Call if BP is consistently elevated above 140/90.

## 2013-03-16 ENCOUNTER — Other Ambulatory Visit: Payer: Self-pay | Admitting: Interventional Cardiology

## 2013-03-29 ENCOUNTER — Encounter: Payer: Self-pay | Admitting: Internal Medicine

## 2013-03-29 ENCOUNTER — Ambulatory Visit (INDEPENDENT_AMBULATORY_CARE_PROVIDER_SITE_OTHER): Payer: Medicare Other | Admitting: Internal Medicine

## 2013-03-29 VITALS — BP 144/60 | HR 65 | Ht <= 58 in | Wt 130.0 lb

## 2013-03-29 DIAGNOSIS — I495 Sick sinus syndrome: Secondary | ICD-10-CM | POA: Diagnosis not present

## 2013-03-29 DIAGNOSIS — Z950 Presence of cardiac pacemaker: Secondary | ICD-10-CM | POA: Diagnosis not present

## 2013-03-29 DIAGNOSIS — I10 Essential (primary) hypertension: Secondary | ICD-10-CM | POA: Diagnosis not present

## 2013-03-29 DIAGNOSIS — I4891 Unspecified atrial fibrillation: Secondary | ICD-10-CM | POA: Diagnosis not present

## 2013-03-29 NOTE — Patient Instructions (Signed)
Your physician recommends that you schedule a follow-up appointment in: 3 months with the device clinic and 6 months with Dr Johney FrameAllred

## 2013-03-29 NOTE — Progress Notes (Signed)
PCP: Ginette Otto, MD Primary Cardiologist: Kalayna Noy is a 80 y.o. female who presents today for routine electrophysiology followup.  Since last being seen in our clinic, the patient reports doing very well.  She and her family have chosen to follow her pacemaker battery status conservatively as opposed to generator change at this time.  Today, she denies symptoms of palpitations, chest pain, shortness of breath,  lower extremity edema, dizziness, presyncope, or syncope.  The patient is otherwise without complaint today.   Past Medical History  Diagnosis Date  . Pure hypercholesterolemia   . Hyperlipidemia     Tolerating medication well as of 11/27/12  . Hypothyroidism   . Carpal tunnel syndrome   . Benign hypertensive heart disease without heart failure   . Coronary atherosclerosis of native coronary artery   . Spinal stenosis of lumbar region   . Osteopenia   . Postsurgical percutaneous transluminal coronary angioplasty status   . Essential hypertension, benign   . Benign hypertensive kidney disease with chronic kidney disease stage I through stage IV, or unspecified   . Diabetes mellitus with chronic kidney disease   . NSTEMI (non-ST elevated myocardial infarction) 09/21/09    BM stent RCA  . Atrial fibrillation   . Tachy-brady syndrome     s/p PPM, battery change 08/01/09  . Sinoatrial node dysfunction   . Chronic anemia   . Osteoarthritis   . H/O echocardiogram 07/19/09    EF = 60-65%  . Hyperglycemia     Mild  . Microalbuminuria 02/2010  . Vitamin D deficiency 10/2011  . Chronic renal disease, stage III    Past Surgical History  Procedure Laterality Date  . Carpal tunnel release Right 02/07/09  . Vesicovaginal fistula closure w/ tah    . Oophorectomy      With hysterectomy. One ovary removed-fibroids.  . Pacemaker insertion  2004    Gen change 08/04/09 by Dr. Amil Amen. New pacing generator is a Medtronic EnRhythm, model D1939726, serial [id number]  .  Pacemaker generator change  08/04/09    By Dr. Amil Amen. New pacing generator is a Medtronic EnRhythm, model D1939726, serial [id number]  . Percutaneous coronary stent intervention (pci-s)  05/08/02    (3 tandem Cypher stent), proximal and mid LAD  . Coronary angioplasty with stent placement  09/21/09    By Dr. Amil Amen. ACS, NSTEMI - BM stent RCA    Current Outpatient Prescriptions  Medication Sig Dispense Refill  . aspirin 325 MG tablet Take 325 mg by mouth daily.      Marland Kitchen diltiazem (CARDIZEM CD) 120 MG 24 hr capsule Take 120 mg by mouth daily.      Marland Kitchen dronedarone (MULTAQ) 400 MG tablet Take 400 mg by mouth 2 (two) times daily with a meal.      . levothyroxine (SYNTHROID) 50 MCG tablet Take 25 mcg by mouth daily before breakfast.      . nitroGLYCERIN (NITROSTAT) 0.4 MG SL tablet Place 0.4 mg under the tongue every 5 (five) minutes as needed for chest pain.      . rosuvastatin (CRESTOR) 5 MG tablet Take 5 mg by mouth daily.      Marland Kitchen ZETIA 10 MG tablet TAKE 1 TABLET BY MOUTH ONCE DAILY  30 tablet  5   No current facility-administered medications for this visit.    Physical Exam: Filed Vitals:   03/29/13 1405  BP: 144/60  Pulse: 65  Height: 4\' 2"  (1.27 m)  Weight: 130 lb (58.968 kg)  GEN- The patient is elderly but well appearing, alert and oriented x 3 today.  Head- normocephalic, atraumatic  Eyes- Sclera clear, conjunctiva pink  Ears- hearing intact  Oropharynx- clear  Neck- supple, no JVP  Lymph- no cervical lymphadenopathy  Lungs- Clear to ausculation bilaterally, normal work of breathing  Chest- pacemaker pocket is well healed  Heart- Regular rate and rhythm, no murmurs, rubs or gallops, PMI not laterally displaced  GI- soft, NT, ND, + BS  Extremities- no clubbing, cyanosis, or edema  Neuro- strength and sensation are intact  Pacemaker interrogation- reviewed in detail today, See PACEART report   Assessment and Plan:  1. Tachycardia/ bradycardia  The patient is s/p PPM. Her  last generator change was in 2011. Unfortunately, she has reached premature EOL which has been observed and is well reported with the MDT Enrhythm device. I have spoken with the patient and her son (a physician in our community) at length previouslly about the implications of the Enrhythm battery issue.  At this time, we have elected to follow conservatively.  We will follow the battery voltage in the office every 3 months.  She is not dependant.  2. afib  Controlled  Not felt to be a candidate for anticoagulation  3. htn Stable No change required today  Return to the device clinic in 3 months to assess battery voltage.  As long as it is unchanged, we will not make any changes.   Return to see me in 6 months

## 2013-04-01 LAB — MDC_IDC_ENUM_SESS_TYPE_INCLINIC

## 2013-05-28 DIAGNOSIS — E7800 Pure hypercholesterolemia, unspecified: Secondary | ICD-10-CM | POA: Diagnosis not present

## 2013-05-28 DIAGNOSIS — I10 Essential (primary) hypertension: Secondary | ICD-10-CM | POA: Diagnosis not present

## 2013-05-28 DIAGNOSIS — Z79899 Other long term (current) drug therapy: Secondary | ICD-10-CM | POA: Diagnosis not present

## 2013-05-28 DIAGNOSIS — E039 Hypothyroidism, unspecified: Secondary | ICD-10-CM | POA: Diagnosis not present

## 2013-07-02 ENCOUNTER — Ambulatory Visit (INDEPENDENT_AMBULATORY_CARE_PROVIDER_SITE_OTHER): Payer: Medicare Other | Admitting: *Deleted

## 2013-07-02 DIAGNOSIS — I4891 Unspecified atrial fibrillation: Secondary | ICD-10-CM

## 2013-07-02 DIAGNOSIS — I495 Sick sinus syndrome: Secondary | ICD-10-CM

## 2013-07-02 LAB — MDC_IDC_ENUM_SESS_TYPE_INCLINIC
Battery Voltage: 2.9 V
Brady Statistic AS VP Percent: 13.59 %
Brady Statistic RA Percent Paced: 30.37 %
Brady Statistic RV Percent Paced: 16.47 %
Lead Channel Impedance Value: 376 Ohm
Lead Channel Pacing Threshold Amplitude: 1 V
Lead Channel Pacing Threshold Pulse Width: 0.6 ms
Lead Channel Sensing Intrinsic Amplitude: 20.3792
Lead Channel Setting Pacing Amplitude: 2 V
Lead Channel Setting Pacing Amplitude: 2 V
Lead Channel Setting Pacing Pulse Width: 0.6 ms
Lead Channel Setting Sensing Sensitivity: 0.9 mV
MDC IDC MSMT LEADCHNL RA SENSING INTR AMPL: 3.972
MDC IDC MSMT LEADCHNL RV IMPEDANCE VALUE: 528 Ohm
MDC IDC SESS DTM: 20150615191944
MDC IDC STAT BRADY AP VP PERCENT: 2.89 %
MDC IDC STAT BRADY AP VS PERCENT: 27.48 %
MDC IDC STAT BRADY AS VS PERCENT: 56.04 %
Zone Setting Detection Interval: 350 ms
Zone Setting Detection Interval: 400 ms

## 2013-07-02 NOTE — Progress Notes (Signed)
Pacemaker check in clinic. Normal device function. Threshold, sensing, impedances consistent with previous measurements. Device programmed to maximize longevity. 3,875 AF episodes (63.7%) + ASA/Multaq. 77 SVT episodes---max dur. 21 mins, Max A 429, Max V 188---AF/RVR. Device programmed at appropriate safety margins. Histogram distribution appropriate for patient activity level. Device programmed to optimize intrinsic conduction. Batt voltage 2.81---device at EOL---previously noted---per JA (follow conservatively). Patient will follow up with JA in 3 months.

## 2013-07-03 ENCOUNTER — Other Ambulatory Visit: Payer: Self-pay | Admitting: Interventional Cardiology

## 2013-07-17 ENCOUNTER — Encounter: Payer: Self-pay | Admitting: Cardiology

## 2013-07-23 ENCOUNTER — Other Ambulatory Visit: Payer: Self-pay

## 2013-07-23 DIAGNOSIS — Z1231 Encounter for screening mammogram for malignant neoplasm of breast: Secondary | ICD-10-CM

## 2013-08-27 ENCOUNTER — Other Ambulatory Visit: Payer: Self-pay

## 2013-08-27 ENCOUNTER — Ambulatory Visit
Admission: RE | Admit: 2013-08-27 | Discharge: 2013-08-27 | Disposition: A | Discharge status: Home / Self Care | Payer: Medicare Other | Source: Ambulatory Visit

## 2013-08-27 DIAGNOSIS — Z1231 Encounter for screening mammogram for malignant neoplasm of breast: Secondary | ICD-10-CM

## 2013-09-06 DIAGNOSIS — H52209 Unspecified astigmatism, unspecified eye: Secondary | ICD-10-CM | POA: Diagnosis not present

## 2013-09-06 DIAGNOSIS — H18509 Unspecified hereditary corneal dystrophies, unspecified eye: Secondary | ICD-10-CM | POA: Diagnosis not present

## 2013-09-06 DIAGNOSIS — Z961 Presence of intraocular lens: Secondary | ICD-10-CM | POA: Diagnosis not present

## 2013-09-19 ENCOUNTER — Encounter: Payer: Self-pay | Admitting: Internal Medicine

## 2013-10-01 ENCOUNTER — Encounter: Payer: Medicare Other | Admitting: Internal Medicine

## 2013-10-05 ENCOUNTER — Other Ambulatory Visit (HOSPITAL_COMMUNITY): Payer: Self-pay | Admitting: *Deleted

## 2013-10-05 DIAGNOSIS — I6529 Occlusion and stenosis of unspecified carotid artery: Secondary | ICD-10-CM

## 2013-10-16 ENCOUNTER — Ambulatory Visit (HOSPITAL_COMMUNITY): Payer: Medicare Other | Attending: Cardiology | Admitting: Cardiology

## 2013-10-16 ENCOUNTER — Encounter: Payer: Self-pay | Admitting: Interventional Cardiology

## 2013-10-16 ENCOUNTER — Ambulatory Visit (INDEPENDENT_AMBULATORY_CARE_PROVIDER_SITE_OTHER): Payer: Medicare Other | Admitting: Interventional Cardiology

## 2013-10-16 VITALS — BP 104/68 | HR 96 | Ht <= 58 in | Wt 128.8 lb

## 2013-10-16 DIAGNOSIS — I2510 Atherosclerotic heart disease of native coronary artery without angina pectoris: Secondary | ICD-10-CM | POA: Diagnosis not present

## 2013-10-16 DIAGNOSIS — Z950 Presence of cardiac pacemaker: Secondary | ICD-10-CM | POA: Diagnosis not present

## 2013-10-16 DIAGNOSIS — I6529 Occlusion and stenosis of unspecified carotid artery: Secondary | ICD-10-CM | POA: Diagnosis not present

## 2013-10-16 DIAGNOSIS — I10 Essential (primary) hypertension: Secondary | ICD-10-CM | POA: Insufficient documentation

## 2013-10-16 DIAGNOSIS — I4891 Unspecified atrial fibrillation: Secondary | ICD-10-CM | POA: Diagnosis not present

## 2013-10-16 DIAGNOSIS — I480 Paroxysmal atrial fibrillation: Secondary | ICD-10-CM

## 2013-10-16 NOTE — Progress Notes (Signed)
Patient ID: [name], female   DOB: [birth year], 80 y.o.   MRN: [id number] Patient ID: [name], female   DOB: [birth year], 80 y.o.   MRN: [id number]    [address] [place], [place]  [address] Phone: [phone] Fax:  [phone]  Date:  10/16/2013   ID:  [name], DOB 1918-08-02, MRN [id number]  PCP:  Ginette Otto, MD      History of Present Illness: [name] is a 80 y.o. female who has had tachybrady syndrome, CAD. In the past few months, her HR has been well controlled. Constipation and diarrhea have resolved. She has not been walking as much. She stopped yoga class. She has some issues with fatigue. She gets tired shopping. She only goes with someone. Atrial Fibrillation F/U:  Denies : Chest pain.  Dizziness.  Leg edema.  Orthopnea.  Palpitations.  Shortness of breath.  Syncope.  No longer driving.    Wt Readings from Last 3 Encounters:  10/16/13 128 lb 12.8 oz (58.423 kg)  03/29/13 130 lb (58.968 kg)  02/08/13 131 lb 6.4 oz (59.603 kg)     Past Medical History  Diagnosis Date  . Pure hypercholesterolemia   . Hyperlipidemia     Tolerating medication well as of 11/27/12  . Hypothyroidism   . Carpal tunnel syndrome   . Benign hypertensive heart disease without heart failure   . Coronary atherosclerosis of native coronary artery   . Spinal stenosis of lumbar region   . Osteopenia   . Postsurgical percutaneous transluminal coronary angioplasty status   . Essential hypertension, benign   . Benign hypertensive kidney disease with chronic kidney disease stage I through stage IV, or unspecified   . Diabetes mellitus with chronic kidney disease   . NSTEMI (non-ST elevated myocardial infarction) 09/21/09    BM stent RCA  . Atrial fibrillation   . Tachy-brady syndrome     s/p PPM, battery change 08/01/09  . Sinoatrial node dysfunction   . Chronic anemia   . Osteoarthritis   . H/O echocardiogram 07/19/09    EF = 60-65%  .  Hyperglycemia     Mild  . Microalbuminuria 02/2010  . Vitamin D deficiency 10/2011  . Chronic renal disease, stage III     Current Outpatient Prescriptions  Medication Sig Dispense Refill  . aspirin 325 MG tablet Take 325 mg by mouth daily.      Marland Kitchen diltiazem (CARDIZEM CD) 120 MG 24 hr capsule Take 120 mg by mouth daily.      Marland Kitchen levothyroxine (SYNTHROID) 50 MCG tablet Take 25 mcg by mouth daily before breakfast.      . MULTAQ 400 MG tablet TAKE 1 TABLET BY MOUTH TWICE DAILY  60 tablet  3  . nitroGLYCERIN (NITROSTAT) 0.4 MG SL tablet Place 0.4 mg under the tongue every 5 (five) minutes as needed for chest pain.      . rosuvastatin (CRESTOR) 5 MG tablet Take 5 mg by mouth daily.      Marland Kitchen ZETIA 10 MG tablet TAKE 1 TABLET BY MOUTH ONCE DAILY  30 tablet  5   No current facility-administered medications for this visit.    Allergies:    Allergies  Allergen Reactions  . Penicillins   . Sulfa Antibiotics     Social History:  The patient  reports that she has never smoked. She does not have any smokeless tobacco history on file. She reports that she drinks alcohol. She reports that she does not  use illicit drugs.   Family History:  The patient's family history includes Cerebral palsy in her son; Diabetes in her father; Heart disease in her father and mother; Peripheral vascular disease in her brother.   ROS:  Please see the history of present illness.  No nausea, vomiting.  No fevers, chills.  No focal weakness.  No dysuria.    All other systems reviewed and negative.   PHYSICAL EXAM: VS:  BP 104/68  Pulse 96  Ht 4\' 2"  (1.27 m)  Wt 128 lb 16.112.8 oz (58.423 kg)  BMI 36.22 kg/m2 Well nourished, well developed, in no acute distress HEENT: normal Neck: no JVD, no carotid bruits Cardiac:  normal S1, S2; RRR; 2/6 systolic murmur; premature beats Lungs:  clear to auscultation bilaterally, no wheezing, rhonchi or rales Abd: soft, nontender, no hepatomegaly Ext: no edema Skin: warm and dry Neuro:    no focal abnormalities noted  EKG:  NSR, lateral T wave inversion; no change     ASSESSMENT AND PLAN:  Essential hypertension, benign  Notes: Controlled today. [facility] If readings are above 140/90 consistently, we can adjust medications. Call us with results.    2. CAD in native artery  Continue Aspirin Tablet, 325 MG, 1 tablet, Orally, Once a day Notes: No angina. Exercise limited due to back pain and fatigue. No bleeding problems.    3. A-fib  Continue Multaq tablet, 400 mg, 1 tablet, orally, Twice daily Notes: No palpitaitons. she is no longer on anticoagulation. Continue aspirin.  Prior h/o significant bleed on Coumadin several years ago.  Normal liver tests in 11/14.  Normal TSH in 5/14.  Followed by Dr. Pete GlatterStoneking.   Rate controlled off of diltiazem.  Will not restart at this time.     4. Pacer: Will need to f/u with Dr. Johney FrameAllred.   Preventive Medicine  Adult topics discussed:  Exercise: 5 days a week, at least 30 minutes of aerobic exercise; can try exercise bike. Try whatever exercise her back tolerates..   Follow Up      Signed, [name] MareJay S. Jasani Dolney, MD, [facility] 10/16/2013 2:20 PM

## 2013-10-16 NOTE — Patient Instructions (Signed)
Your physician recommends that you continue on your current medications as directed. Please refer to the Current Medication list given to you today.  Your physician wants you to follow-up in: 1 year with Dr. Varanasi. You will receive a reminder letter in the mail two months in advance. If you don't receive a letter, please call our office to schedule the follow-up appointment.  

## 2013-10-16 NOTE — Progress Notes (Signed)
Carotid duplex performed 

## 2013-10-19 ENCOUNTER — Other Ambulatory Visit: Payer: Self-pay | Admitting: Cardiology

## 2013-10-19 DIAGNOSIS — I6529 Occlusion and stenosis of unspecified carotid artery: Secondary | ICD-10-CM

## 2013-10-24 ENCOUNTER — Ambulatory Visit (INDEPENDENT_AMBULATORY_CARE_PROVIDER_SITE_OTHER): Payer: Medicare Other | Admitting: Internal Medicine

## 2013-10-24 ENCOUNTER — Encounter: Payer: Self-pay | Admitting: Internal Medicine

## 2013-10-24 VITALS — BP 102/60 | HR 92 | Ht <= 58 in | Wt 128.0 lb

## 2013-10-24 DIAGNOSIS — I482 Chronic atrial fibrillation, unspecified: Secondary | ICD-10-CM

## 2013-10-24 DIAGNOSIS — I495 Sick sinus syndrome: Secondary | ICD-10-CM

## 2013-10-24 DIAGNOSIS — Z950 Presence of cardiac pacemaker: Secondary | ICD-10-CM

## 2013-10-24 DIAGNOSIS — I6529 Occlusion and stenosis of unspecified carotid artery: Secondary | ICD-10-CM

## 2013-10-24 LAB — MDC_IDC_ENUM_SESS_TYPE_INCLINIC
Battery Voltage: 2.88 V
Brady Statistic AP VS Percent: 25.95 %
Brady Statistic AS VP Percent: 13.56 %
Brady Statistic AS VS Percent: 58.16 %
Lead Channel Impedance Value: 376 Ohm
Lead Channel Impedance Value: 608 Ohm
Lead Channel Pacing Threshold Amplitude: 0.5 V
Lead Channel Pacing Threshold Pulse Width: 0.6 ms
Lead Channel Sensing Intrinsic Amplitude: 2.5752
Lead Channel Sensing Intrinsic Amplitude: 20.3792
Lead Channel Setting Pacing Amplitude: 2 V
Lead Channel Setting Pacing Pulse Width: 0.6 ms
MDC IDC SESS DTM: 20151007114906
MDC IDC SET LEADCHNL RV PACING AMPLITUDE: 2 V
MDC IDC SET LEADCHNL RV SENSING SENSITIVITY: 0.9 mV
MDC IDC SET ZONE DETECTION INTERVAL: 350 ms
MDC IDC SET ZONE DETECTION INTERVAL: 400 ms
MDC IDC STAT BRADY AP VP PERCENT: 2.32 %
MDC IDC STAT BRADY RA PERCENT PACED: 28.28 %
MDC IDC STAT BRADY RV PERCENT PACED: 15.88 %

## 2013-10-24 NOTE — Progress Notes (Signed)
PCP: Ginette Otto, MD Primary Cardiologist: Polina Burmaster is a 80 y.o. female who presents today for routine electrophysiology followup.  Since last being seen in our clinic, the patient reports doing very well.  She and her family have chosen to follow her pacemaker battery status conservatively as opposed to generator change at this time previously.  She remains under warranty until 7/16.  I have discussed this at length with the patient and her daughter today.  I have recommended generator change as per manufacturer recommendation today.  I did discuss the implications of the Enrhythm recall at length with the patient and her daughter today.  I have previously discussed this with her son and have sent him a link to MDT online support by Email.  She remains in atrial flutter for which she is presently asymptomatic.  She is felt by Dr Eldridge Dace and pts son to not be a candidate for anticoagulation.  Today, she denies symptoms of palpitations, chest pain, shortness of breath,  lower extremity edema, dizziness, presyncope, or syncope.  The patient is otherwise without complaint today.   Past Medical History  Diagnosis Date  . Pure hypercholesterolemia   . Hyperlipidemia     Tolerating medication well as of 11/27/12  . Hypothyroidism   . Carpal tunnel syndrome   . Benign hypertensive heart disease without heart failure   . Coronary atherosclerosis of native coronary artery   . Spinal stenosis of lumbar region   . Osteopenia   . Postsurgical percutaneous transluminal coronary angioplasty status   . Essential hypertension, benign   . Benign hypertensive kidney disease with chronic kidney disease stage I through stage IV, or unspecified   . Diabetes mellitus with chronic kidney disease   . NSTEMI (non-ST elevated myocardial infarction) 09/21/09    BM stent RCA  . Atrial fibrillation   . Tachy-brady syndrome     s/p PPM, battery change 08/01/09  . Sinoatrial node dysfunction   .  Chronic anemia   . Osteoarthritis   . H/O echocardiogram 07/19/09    EF = 60-65%  . Hyperglycemia     Mild  . Microalbuminuria 02/2010  . Vitamin D deficiency 10/2011  . Chronic renal disease, stage III    Past Surgical History  Procedure Laterality Date  . Carpal tunnel release Right 02/07/09  . Vesicovaginal fistula closure w/ tah    . Oophorectomy      With hysterectomy. One ovary removed-fibroids.  . Pacemaker insertion  2004    Gen change 08/04/09 by Dr. Amil Amen. New pacing generator is a Medtronic EnRhythm, model D1939726, serial [id number]  . Pacemaker generator change  08/04/09    By Dr. Amil Amen. New pacing generator is a Medtronic EnRhythm, model D1939726, serial [id number]  . Percutaneous coronary stent intervention (pci-s)  05/08/02    (3 tandem Cypher stent), proximal and mid LAD  . Coronary angioplasty with stent placement  09/21/09    By Dr. Amil Amen. ACS, NSTEMI - BM stent RCA    Current Outpatient Prescriptions  Medication Sig Dispense Refill  . aspirin 325 MG tablet Take 325 mg by mouth daily.      . MULTAQ 400 MG tablet TAKE 1 TABLET BY MOUTH TWICE DAILY  60 tablet  3  . nitroGLYCERIN (NITROSTAT) 0.4 MG SL tablet Place 0.4 mg under the tongue every 5 (five) minutes as needed for chest pain (MAX 3 TABLETS).       . rosuvastatin (CRESTOR) 5 MG tablet Take 5 mg by mouth daily.      Marland Kitchen  ZETIA 10 MG tablet TAKE 1 TABLET BY MOUTH ONCE DAILY  30 tablet  5  . levothyroxine (SYNTHROID) 50 MCG tablet Take 25 mcg by mouth daily before breakfast.       No current facility-administered medications for this visit.    Physical Exam: Filed Vitals:   10/24/13 1054  BP: 102/60  Pulse: 92  Height: 4\' 2"  (1.27 m)  Weight: 128 lb (58.06 kg)   GEN- The patient is elderly but well appearing, alert and oriented x 3 today.  Head- normocephalic, atraumatic  Eyes- Sclera clear, conjunctiva pink  Ears- hearing intact  Oropharynx- clear  Neck- supple,    Lungs- Clear to ausculation  bilaterally, normal work of breathing  Chest- pacemaker pocket is well healed  Heart- irregular rate and rhythm, no murmurs, rubs or gallops, PMI not laterally displaced  GI- soft, NT, ND, + BS  Extremities- no clubbing, cyanosis, or edema  Neuro- strength and sensation are intact   Pacemaker interrogation- reviewed in detail today, See PACEART report   ekg today reveals atrial flutter, V rate 90 bpm, nonspecific ST/T changes  Assessment and Plan:  1. Tachycardia/ bradycardia  The patient is s/p PPM. Her last generator change was in 2011. Unfortunately, she has reached premature EOL which has been observed and is well reported with the MDT Enrhythm device. I have spoken with the patient and her son (a physician in our community) at length previouslly about the implications of the Enrhythm battery issue.  As she has about 6 months of warranty remaining on the device, I have strongly encouraged pulse generator change at this time.  She will contemplate this option and discuss further with her family.  She is not presently ready to proceed.   She is not dependant.  2. afib \/ atrial flutter Controlled V rate Not felt to be a candidate for anticoagulation She is in atrial fib/ atrial flutter at least 66.6 % of the time by device interrogation and is asymptomatic I think that we should stop her multaq if her increased arrhythmia burden continues  3. htn Stable No change required today  She will contact me if she is ready to proceed with pulse generator replacement. Return to see me in 6 months

## 2013-10-24 NOTE — Patient Instructions (Signed)
Your physician wants you to follow-up in: 6 months with Dr Jacquiline DoeAllred You will receive a reminder letter in the mail two months in advance. If you don't receive a letter, please call our office to schedule the follow-up appointment.  Call if your wish to proceed with generator change  Thank you for choosing [facility]!!     Dennis BastKelly Nathaniel Wakeley, RN [phone]

## 2013-10-31 DIAGNOSIS — Z23 Encounter for immunization: Secondary | ICD-10-CM | POA: Diagnosis not present

## 2013-12-03 DIAGNOSIS — E039 Hypothyroidism, unspecified: Secondary | ICD-10-CM | POA: Diagnosis not present

## 2013-12-03 DIAGNOSIS — N183 Chronic kidney disease, stage 3 (moderate): Secondary | ICD-10-CM | POA: Diagnosis not present

## 2013-12-03 DIAGNOSIS — Z79899 Other long term (current) drug therapy: Secondary | ICD-10-CM | POA: Diagnosis not present

## 2013-12-03 DIAGNOSIS — I129 Hypertensive chronic kidney disease with stage 1 through stage 4 chronic kidney disease, or unspecified chronic kidney disease: Secondary | ICD-10-CM | POA: Diagnosis not present

## 2013-12-03 DIAGNOSIS — R413 Other amnesia: Secondary | ICD-10-CM | POA: Diagnosis not present

## 2013-12-04 DIAGNOSIS — E039 Hypothyroidism, unspecified: Secondary | ICD-10-CM | POA: Diagnosis not present

## 2013-12-04 DIAGNOSIS — Z79899 Other long term (current) drug therapy: Secondary | ICD-10-CM | POA: Diagnosis not present

## 2014-01-02 ENCOUNTER — Other Ambulatory Visit: Payer: Self-pay | Admitting: Interventional Cardiology

## 2014-01-03 DIAGNOSIS — H6123 Impacted cerumen, bilateral: Secondary | ICD-10-CM | POA: Diagnosis not present

## 2014-01-03 DIAGNOSIS — H903 Sensorineural hearing loss, bilateral: Secondary | ICD-10-CM | POA: Diagnosis not present

## 2014-01-24 DIAGNOSIS — I499 Cardiac arrhythmia, unspecified: Secondary | ICD-10-CM | POA: Diagnosis not present

## 2014-01-24 DIAGNOSIS — R42 Dizziness and giddiness: Secondary | ICD-10-CM | POA: Diagnosis not present

## 2014-02-13 LAB — HEPATIC FUNCTION PANEL
ALT: 18 U/L (ref 7–35)
AST: 18 U/L (ref 13–35)
Alkaline Phosphatase: 64 U/L (ref 25–125)
Bilirubin, Total: 0.5 mg/dL

## 2014-02-13 LAB — CBC AND DIFFERENTIAL
HCT: 43 % (ref 36–46)
HEMOGLOBIN: 14.2 g/dL (ref 12.0–16.0)
Platelets: 238 10*3/uL (ref 150–399)
WBC: 10.4 10^3/mL

## 2014-02-13 LAB — TSH: TSH: 5.61 u[IU]/mL (ref 0.41–5.90)

## 2014-02-13 LAB — BASIC METABOLIC PANEL
BUN: 26 mg/dL — AB (ref 4–21)
Creatinine: 1.3 mg/dL — AB (ref 0.5–1.1)
GLUCOSE: 123 mg/dL
POTASSIUM: 4.4 mmol/L (ref 3.4–5.3)
Sodium: 138 mmol/L (ref 137–147)

## 2014-02-13 LAB — HEMOGLOBIN A1C: Hemoglobin A1C: 7.7

## 2014-02-24 ENCOUNTER — Other Ambulatory Visit: Payer: Self-pay | Admitting: Interventional Cardiology

## 2014-04-02 ENCOUNTER — Other Ambulatory Visit: Payer: Self-pay | Admitting: Interventional Cardiology

## 2014-04-22 ENCOUNTER — Encounter (HOSPITAL_COMMUNITY): Payer: Medicare Other

## 2014-04-22 DIAGNOSIS — R0989 Other specified symptoms and signs involving the circulatory and respiratory systems: Secondary | ICD-10-CM

## 2014-04-29 DIAGNOSIS — H903 Sensorineural hearing loss, bilateral: Secondary | ICD-10-CM | POA: Diagnosis not present

## 2014-04-29 DIAGNOSIS — H6123 Impacted cerumen, bilateral: Secondary | ICD-10-CM | POA: Diagnosis not present

## 2014-05-08 ENCOUNTER — Emergency Department (HOSPITAL_COMMUNITY): Payer: Medicare Other

## 2014-05-08 ENCOUNTER — Inpatient Hospital Stay (HOSPITAL_COMMUNITY)
Admission: EM | Admit: 2014-05-08 | Discharge: 2014-05-13 | DRG: 061 | Disposition: A | Discharge status: Rehabilitation Facility | Payer: Medicare Other | Attending: Neurology | Admitting: Neurology

## 2014-05-08 ENCOUNTER — Encounter (HOSPITAL_COMMUNITY): Payer: Self-pay

## 2014-05-08 DIAGNOSIS — I1310 Hypertensive heart and chronic kidney disease without heart failure, with stage 1 through stage 4 chronic kidney disease, or unspecified chronic kidney disease: Secondary | ICD-10-CM | POA: Diagnosis present

## 2014-05-08 DIAGNOSIS — I638 Other cerebral infarction: Secondary | ICD-10-CM | POA: Diagnosis not present

## 2014-05-08 DIAGNOSIS — Z789 Other specified health status: Secondary | ICD-10-CM | POA: Diagnosis not present

## 2014-05-08 DIAGNOSIS — Z950 Presence of cardiac pacemaker: Secondary | ICD-10-CM | POA: Diagnosis not present

## 2014-05-08 DIAGNOSIS — R471 Dysarthria and anarthria: Secondary | ICD-10-CM | POA: Diagnosis present

## 2014-05-08 DIAGNOSIS — G936 Cerebral edema: Secondary | ICD-10-CM | POA: Insufficient documentation

## 2014-05-08 DIAGNOSIS — E1142 Type 2 diabetes mellitus with diabetic polyneuropathy: Secondary | ICD-10-CM | POA: Diagnosis present

## 2014-05-08 DIAGNOSIS — I482 Chronic atrial fibrillation: Secondary | ICD-10-CM | POA: Diagnosis present

## 2014-05-08 DIAGNOSIS — R1310 Dysphagia, unspecified: Secondary | ICD-10-CM | POA: Diagnosis present

## 2014-05-08 DIAGNOSIS — R29810 Facial weakness: Secondary | ICD-10-CM | POA: Diagnosis present

## 2014-05-08 DIAGNOSIS — Z9282 Status post administration of tPA (rtPA) in a different facility within the last 24 hours prior to admission to current facility: Secondary | ICD-10-CM

## 2014-05-08 DIAGNOSIS — G8190 Hemiplegia, unspecified affecting unspecified side: Secondary | ICD-10-CM | POA: Diagnosis not present

## 2014-05-08 DIAGNOSIS — N183 Chronic kidney disease, stage 3 (moderate): Secondary | ICD-10-CM | POA: Diagnosis present

## 2014-05-08 DIAGNOSIS — I69920 Aphasia following unspecified cerebrovascular disease: Secondary | ICD-10-CM | POA: Diagnosis not present

## 2014-05-08 DIAGNOSIS — E780 Pure hypercholesterolemia: Secondary | ICD-10-CM | POA: Diagnosis present

## 2014-05-08 DIAGNOSIS — I609 Nontraumatic subarachnoid hemorrhage, unspecified: Secondary | ICD-10-CM | POA: Diagnosis not present

## 2014-05-08 DIAGNOSIS — I2510 Atherosclerotic heart disease of native coronary artery without angina pectoris: Secondary | ICD-10-CM | POA: Diagnosis present

## 2014-05-08 DIAGNOSIS — I6522 Occlusion and stenosis of left carotid artery: Secondary | ICD-10-CM | POA: Diagnosis not present

## 2014-05-08 DIAGNOSIS — I6789 Other cerebrovascular disease: Secondary | ICD-10-CM | POA: Diagnosis not present

## 2014-05-08 DIAGNOSIS — G8191 Hemiplegia, unspecified affecting right dominant side: Secondary | ICD-10-CM | POA: Diagnosis present

## 2014-05-08 DIAGNOSIS — E039 Hypothyroidism, unspecified: Secondary | ICD-10-CM | POA: Diagnosis present

## 2014-05-08 DIAGNOSIS — E1122 Type 2 diabetes mellitus with diabetic chronic kidney disease: Secondary | ICD-10-CM | POA: Diagnosis present

## 2014-05-08 DIAGNOSIS — I63412 Cerebral infarction due to embolism of left middle cerebral artery: Secondary | ICD-10-CM | POA: Diagnosis not present

## 2014-05-08 DIAGNOSIS — I4891 Unspecified atrial fibrillation: Secondary | ICD-10-CM | POA: Diagnosis not present

## 2014-05-08 DIAGNOSIS — E559 Vitamin D deficiency, unspecified: Secondary | ICD-10-CM | POA: Diagnosis not present

## 2014-05-08 DIAGNOSIS — E785 Hyperlipidemia, unspecified: Secondary | ICD-10-CM | POA: Diagnosis present

## 2014-05-08 DIAGNOSIS — Z955 Presence of coronary angioplasty implant and graft: Secondary | ICD-10-CM

## 2014-05-08 DIAGNOSIS — I619 Nontraumatic intracerebral hemorrhage, unspecified: Secondary | ICD-10-CM | POA: Insufficient documentation

## 2014-05-08 DIAGNOSIS — I6523 Occlusion and stenosis of bilateral carotid arteries: Secondary | ICD-10-CM | POA: Diagnosis present

## 2014-05-08 DIAGNOSIS — R918 Other nonspecific abnormal finding of lung field: Secondary | ICD-10-CM | POA: Diagnosis not present

## 2014-05-08 DIAGNOSIS — M1990 Unspecified osteoarthritis, unspecified site: Secondary | ICD-10-CM | POA: Diagnosis present

## 2014-05-08 DIAGNOSIS — I69351 Hemiplegia and hemiparesis following cerebral infarction affecting right dominant side: Secondary | ICD-10-CM | POA: Diagnosis not present

## 2014-05-08 DIAGNOSIS — I639 Cerebral infarction, unspecified: Secondary | ICD-10-CM | POA: Diagnosis not present

## 2014-05-08 DIAGNOSIS — I252 Old myocardial infarction: Secondary | ICD-10-CM | POA: Diagnosis not present

## 2014-05-08 DIAGNOSIS — I63512 Cerebral infarction due to unspecified occlusion or stenosis of left middle cerebral artery: Secondary | ICD-10-CM | POA: Diagnosis not present

## 2014-05-08 DIAGNOSIS — K5900 Constipation, unspecified: Secondary | ICD-10-CM | POA: Diagnosis present

## 2014-05-08 DIAGNOSIS — R413 Other amnesia: Secondary | ICD-10-CM | POA: Diagnosis present

## 2014-05-08 DIAGNOSIS — I10 Essential (primary) hypertension: Secondary | ICD-10-CM | POA: Diagnosis not present

## 2014-05-08 DIAGNOSIS — I6502 Occlusion and stenosis of left vertebral artery: Secondary | ICD-10-CM | POA: Diagnosis not present

## 2014-05-08 DIAGNOSIS — I6503 Occlusion and stenosis of bilateral vertebral arteries: Secondary | ICD-10-CM | POA: Diagnosis not present

## 2014-05-08 DIAGNOSIS — I618 Other nontraumatic intracerebral hemorrhage: Secondary | ICD-10-CM | POA: Diagnosis not present

## 2014-05-08 DIAGNOSIS — I6330 Cerebral infarction due to thrombosis of unspecified cerebral artery: Secondary | ICD-10-CM | POA: Diagnosis not present

## 2014-05-08 DIAGNOSIS — R4781 Slurred speech: Secondary | ICD-10-CM | POA: Diagnosis not present

## 2014-05-08 LAB — URINALYSIS, ROUTINE W REFLEX MICROSCOPIC
Bilirubin Urine: NEGATIVE
Glucose, UA: NEGATIVE mg/dL
Ketones, ur: NEGATIVE mg/dL
NITRITE: NEGATIVE
Protein, ur: NEGATIVE mg/dL
Specific Gravity, Urine: 1.008 (ref 1.005–1.030)
UROBILINOGEN UA: 0.2 mg/dL (ref 0.0–1.0)
pH: 7.5 (ref 5.0–8.0)

## 2014-05-08 LAB — I-STAT CHEM 8, ED
BUN: 31 mg/dL — ABNORMAL HIGH (ref 6–23)
BUN: 35 mg/dL — AB (ref 6–23)
CALCIUM ION: 1.14 mmol/L (ref 1.13–1.30)
CREATININE: 1.6 mg/dL — AB (ref 0.50–1.10)
Calcium, Ion: 1.16 mmol/L (ref 1.13–1.30)
Chloride: 104 mmol/L (ref 96–112)
Chloride: 105 mmol/L (ref 96–112)
Creatinine, Ser: 1.7 mg/dL — ABNORMAL HIGH (ref 0.50–1.10)
GLUCOSE: 129 mg/dL — AB (ref 70–99)
Glucose, Bld: 131 mg/dL — ABNORMAL HIGH (ref 70–99)
HCT: 46 % (ref 36.0–46.0)
HCT: 47 % — ABNORMAL HIGH (ref 36.0–46.0)
Hemoglobin: 15.6 g/dL — ABNORMAL HIGH (ref 12.0–15.0)
Hemoglobin: 16 g/dL — ABNORMAL HIGH (ref 12.0–15.0)
POTASSIUM: 4.8 mmol/L (ref 3.5–5.1)
Potassium: 4.9 mmol/L (ref 3.5–5.1)
SODIUM: 139 mmol/L (ref 135–145)
Sodium: 138 mmol/L (ref 135–145)
TCO2: 23 mmol/L (ref 0–100)
TCO2: 24 mmol/L (ref 0–100)

## 2014-05-08 LAB — URINE MICROSCOPIC-ADD ON

## 2014-05-08 LAB — GLUCOSE, CAPILLARY: Glucose-Capillary: 180 mg/dL — ABNORMAL HIGH (ref 70–99)

## 2014-05-08 LAB — CBC
HCT: 44.1 % (ref 36.0–46.0)
Hemoglobin: 14.9 g/dL (ref 12.0–15.0)
MCH: 30.9 pg (ref 26.0–34.0)
MCHC: 33.8 g/dL (ref 30.0–36.0)
MCV: 91.5 fL (ref 78.0–100.0)
PLATELETS: 221 10*3/uL (ref 150–400)
RBC: 4.82 MIL/uL (ref 3.87–5.11)
RDW: 14.3 % (ref 11.5–15.5)
WBC: 11.6 10*3/uL — AB (ref 4.0–10.5)

## 2014-05-08 LAB — PROTIME-INR
INR: 1.13 (ref 0.00–1.49)
PROTHROMBIN TIME: 14.7 s (ref 11.6–15.2)

## 2014-05-08 LAB — COMPREHENSIVE METABOLIC PANEL
ALT: 29 U/L (ref 0–35)
AST: 38 U/L — ABNORMAL HIGH (ref 0–37)
Albumin: 3.6 g/dL (ref 3.5–5.2)
Alkaline Phosphatase: 63 U/L (ref 39–117)
Anion gap: 13 (ref 5–15)
BUN: 27 mg/dL — AB (ref 6–23)
CO2: 23 mmol/L (ref 19–32)
Calcium: 9.4 mg/dL (ref 8.4–10.5)
Chloride: 103 mmol/L (ref 96–112)
Creatinine, Ser: 1.61 mg/dL — ABNORMAL HIGH (ref 0.50–1.10)
GFR, EST AFRICAN AMERICAN: 31 mL/min — AB (ref >90)
GFR, EST NON AFRICAN AMERICAN: 26 mL/min — AB (ref >90)
Glucose, Bld: 129 mg/dL — ABNORMAL HIGH (ref 70–99)
Potassium: 4.9 mmol/L (ref 3.5–5.1)
SODIUM: 139 mmol/L (ref 135–145)
Total Bilirubin: 0.6 mg/dL (ref 0.3–1.2)
Total Protein: 7.1 g/dL (ref 6.0–8.3)

## 2014-05-08 LAB — DIFFERENTIAL
BASOS PCT: 0 % (ref 0–1)
Basophils Absolute: 0 10*3/uL (ref 0.0–0.1)
Eosinophils Absolute: 0.3 10*3/uL (ref 0.0–0.7)
Eosinophils Relative: 2 % (ref 0–5)
Lymphocytes Relative: 24 % (ref 12–46)
Lymphs Abs: 2.8 10*3/uL (ref 0.7–4.0)
Monocytes Absolute: 1 10*3/uL (ref 0.1–1.0)
Monocytes Relative: 8 % (ref 3–12)
Neutro Abs: 7.6 10*3/uL (ref 1.7–7.7)
Neutrophils Relative %: 66 % (ref 43–77)

## 2014-05-08 LAB — CBG MONITORING, ED: Glucose-Capillary: 122 mg/dL — ABNORMAL HIGH (ref 70–99)

## 2014-05-08 LAB — RAPID URINE DRUG SCREEN, HOSP PERFORMED
Amphetamines: NOT DETECTED
BARBITURATES: NOT DETECTED
Benzodiazepines: NOT DETECTED
Cocaine: NOT DETECTED
Opiates: NOT DETECTED
Tetrahydrocannabinol: NOT DETECTED

## 2014-05-08 LAB — ETHANOL

## 2014-05-08 LAB — APTT: aPTT: 30 seconds (ref 24–37)

## 2014-05-08 LAB — MRSA PCR SCREENING: MRSA BY PCR: NEGATIVE

## 2014-05-08 MED ORDER — CLONAZEPAM 0.5 MG PO TABS
0.5000 mg | ORAL_TABLET | Freq: Once | ORAL | Status: AC
  Administered 2014-05-08: 0.5 mg via ORAL
  Filled 2014-05-08: qty 1

## 2014-05-08 MED ORDER — DRONEDARONE HCL 400 MG PO TABS
400.0000 mg | ORAL_TABLET | Freq: Two times a day (BID) | ORAL | Status: DC
  Administered 2014-05-08 – 2014-05-13 (×10): 400 mg via ORAL
  Filled 2014-05-08 (×13): qty 1

## 2014-05-08 MED ORDER — LABETALOL HCL 5 MG/ML IV SOLN
10.0000 mg | INTRAVENOUS | Status: DC | PRN
  Administered 2014-05-08 – 2014-05-09 (×2): 10 mg via INTRAVENOUS
  Filled 2014-05-08 (×2): qty 4

## 2014-05-08 MED ORDER — EZETIMIBE 10 MG PO TABS
10.0000 mg | ORAL_TABLET | Freq: Every day | ORAL | Status: DC
Start: 2014-05-08 — End: 2014-05-13
  Administered 2014-05-08 – 2014-05-13 (×6): 10 mg via ORAL
  Filled 2014-05-08 (×6): qty 1

## 2014-05-08 MED ORDER — STROKE: EARLY STAGES OF RECOVERY BOOK
Freq: Once | Status: DC
  Filled 2014-05-08: qty 1

## 2014-05-08 MED ORDER — ALTEPLASE (STROKE) FULL DOSE INFUSION
0.9000 mg/kg | Freq: Once | INTRAVENOUS | Status: AC
  Administered 2014-05-08: 52 mg via INTRAVENOUS
  Filled 2014-05-08: qty 52

## 2014-05-08 MED ORDER — SODIUM CHLORIDE 0.9 % IV SOLN
INTRAVENOUS | Status: DC
  Administered 2014-05-08 – 2014-05-09 (×3): via INTRAVENOUS

## 2014-05-08 MED ORDER — ACETAMINOPHEN 650 MG RE SUPP: 650.0000 mg | RECTAL | Status: DC | PRN

## 2014-05-08 MED ORDER — ROSUVASTATIN CALCIUM 5 MG PO TABS
5.0000 mg | ORAL_TABLET | Freq: Every day | ORAL | Status: DC
  Administered 2014-05-09 – 2014-05-13 (×5): 5 mg via ORAL
  Filled 2014-05-08 (×6): qty 1

## 2014-05-08 MED ORDER — SENNOSIDES-DOCUSATE SODIUM 8.6-50 MG PO TABS
1.0000 | ORAL_TABLET | Freq: Every evening | ORAL | Status: DC | PRN
  Administered 2014-05-08: 1 via ORAL
  Filled 2014-05-08 (×2): qty 1

## 2014-05-08 MED ORDER — LEVOTHYROXINE SODIUM 25 MCG PO TABS
25.0000 ug | ORAL_TABLET | Freq: Every day | ORAL | Status: DC
  Administered 2014-05-09 – 2014-05-13 (×5): 25 ug via ORAL
  Filled 2014-05-08 (×6): qty 1

## 2014-05-08 MED ORDER — PANTOPRAZOLE SODIUM 40 MG IV SOLR
40.0000 mg | Freq: Every day | INTRAVENOUS | Status: DC
  Administered 2014-05-08 – 2014-05-10 (×3): 40 mg via INTRAVENOUS
  Filled 2014-05-08 (×4): qty 40

## 2014-05-08 MED ORDER — ACETAMINOPHEN 325 MG PO TABS: 650.0000 mg | ORAL_TABLET | ORAL | Status: DC | PRN

## 2014-05-08 NOTE — Progress Notes (Signed)
eLink Physician-Brief Progress Note Patient Name: [name] DOB: 1918-04-18 MRN: [id number]   Date of Service  05/08/2014  HPI/Events of Note  81 yo female with Ischemic CVA, symptoms of slurred speech, right sided weakness. S\P TPA  eICU Interventions  Hx of afib. Now s\p tpa. Seondary CVA prevention BP control Monitor for bleeding. Will need anticoagulation in the future given Afib and CVA history.   GI\DVT Prophylaxis.      Intervention Category Evaluation Type: New Patient Evaluation  [name] 05/08/2014, 6:42 PM

## 2014-05-08 NOTE — Code Documentation (Signed)
81[facility].  Patient is from [facility] where she was witnessed to be talking with the staff at the desk prior to going to dinner when they noticed her to become confused with right sided weakness and slurred speech.  EMS reports that patient was initially confused and combative.  Patient's confusion improved en route.  Stroke team at the bedside on arrival.  Labs drawn and patient cleared by Dr. Loretha StaplerWofford.  Patient to CT with stroke team.  Patient back to Trauma A.  NIHSS 5, see documentation for details and code stroke times.  2nd PIV placed.  Treatment with tPA discussed with patient and patient's son via phone.  Decision to treat with tPA made at 1657.  Pharmacy notified to mix tPA at 1657 and tPA delivered to the bedside at 1705.  BP within tPA parameters and order to start tPA.  tPA 5mg  bolus given at 1708 followed by 4647ml/hr per pharmacy.  Patient monitored frequently per tPA protocol.  Patient's family to the bedside and patient and family updated on plan of care. Report given to 652M RN by ED RN.  Patient transported to 652M11 with stroke RN and RRT RN.  Bedside handoff with 652M RN Hermelinda DellenKeneisha.

## 2014-05-08 NOTE — ED Notes (Signed)
Neuro MD at bedside

## 2014-05-08 NOTE — H&P (Addendum)
History and Physical    Chief Complaint: Code stroke  HPI:                                                                                                                                         [name] is an 81 y.o. female who resides in [place]. She has a history of Afib on ASA. She was noted to be doing well until 1610 when she suddenly became confused, had slurred speech, right facial droop and right sided weakness.  EMS was called and patient was brought to [facility] health as a code stroke. On arrival she was alert and oriented but continued to show slurred speech, right facial droop and right sided weakness. She was within the window for tPA with no contra indications. Decision was made to give tPA    She does have a history of GI bleed which is why she was taken off of coumadin. She denies any bleeding within the past month.   Date last known well: Date: 05/08/2014 Time last known well: Time: 16:10 tPA Given: Yes Modified Rankin: Rankin Score=0    Past Medical History  Diagnosis Date  . Pure hypercholesterolemia   . Hyperlipidemia     Tolerating medication well as of 11/27/12  . Hypothyroidism   . Carpal tunnel syndrome   . Benign hypertensive heart disease without heart failure   . Coronary atherosclerosis of native coronary artery   . Spinal stenosis of lumbar region   . Osteopenia   . Postsurgical percutaneous transluminal coronary angioplasty status   . Essential hypertension, benign   . Benign hypertensive kidney disease with chronic kidney disease stage I through stage IV, or unspecified   . Diabetes mellitus with chronic kidney disease   . NSTEMI (non-ST elevated myocardial infarction) 09/21/09    BM stent RCA  . Atrial fibrillation   . Tachy-brady syndrome     s/p PPM, battery change 08/01/09  . Sinoatrial node dysfunction   . Chronic anemia   . Osteoarthritis   . H/O echocardiogram 07/19/09    EF = 60-65%  . Hyperglycemia     Mild  . Microalbuminuria  02/2010  . Vitamin D deficiency 10/2011  . Chronic renal disease, stage III     Past Surgical History  Procedure Laterality Date  . Carpal tunnel release Right 02/07/09  . Vesicovaginal fistula closure w/ tah    . Oophorectomy      With hysterectomy. One ovary removed-fibroids.  . Pacemaker insertion  2004    Gen change 08/04/09 by Dr. Amil Amen. New pacing generator is a Medtronic EnRhythm, model D1939726, serial [id number]  . Pacemaker generator change  08/04/09    By Dr. Amil Amen. New pacing generator is a Medtronic EnRhythm, model D1939726, serial [id number]  . Percutaneous coronary stent intervention (pci-s)  05/08/02    (3 tandem Cypher stent), proximal and mid LAD  . Coronary  angioplasty with stent placement  09/21/09    By Dr. Amil Amen. ACS, NSTEMI - BM stent RCA    Family History  Problem Relation Age of Onset  . Heart disease Mother   . Heart disease Father   . Diabetes Father   . Peripheral vascular disease Brother   . Cerebral palsy Son    Social History:  reports that she has never smoked. She does not have any smokeless tobacco history on file. She reports that she drinks alcohol. She reports that she does not use illicit drugs.  Allergies:  Allergies  Allergen Reactions  . Penicillins   . Sulfa Antibiotics     Medications:                                                                                                                           Current Facility-Administered Medications  Medication Dose Route Frequency Provider Last Rate Last Dose  . alteplase (ACTIVASE) 1 mg/mL infusion 52 mg  0.9 mg/kg Intravenous Once Blake Divine, MD       Current Outpatient Prescriptions  Medication Sig Dispense Refill  . aspirin 325 MG tablet Take 325 mg by mouth daily.    Marland Kitchen levothyroxine (SYNTHROID) 50 MCG tablet Take 25 mcg by mouth daily before breakfast.    . MULTAQ 400 MG tablet TAKE 1 TABLET BY MOUTH TWICE DAILY 60 tablet 10  . nitroGLYCERIN (NITROSTAT) 0.4 MG SL tablet  Place 0.4 mg under the tongue every 5 (five) minutes as needed for chest pain (MAX 3 TABLETS).     . rosuvastatin (CRESTOR) 5 MG tablet Take 5 mg by mouth daily.    Marland Kitchen ZETIA 10 MG tablet TAKE 1 TABLET BY MOUTH ONCE DAILY 30 tablet 9     ROS:                                                                                                                                       History obtained from the patient  General ROS: negative for - chills, fatigue, fever, night sweats, weight gain or weight loss Psychological ROS: negative for - behavioral disorder, hallucinations, memory difficulties, mood swings or suicidal ideation Ophthalmic ROS: negative for - blurry vision, double vision, eye pain or loss of vision ENT ROS: negative for - epistaxis, nasal discharge, oral lesions, sore throat, tinnitus or vertigo Allergy and Immunology ROS: negative for -  hives or itchy/watery eyes Hematological and Lymphatic ROS: negative for - bleeding problems, bruising or swollen lymph nodes Endocrine ROS: negative for - galactorrhea, hair pattern changes, polydipsia/polyuria or temperature intolerance Respiratory ROS: negative for - cough, hemoptysis, shortness of breath or wheezing Cardiovascular ROS: negative for - chest pain, dyspnea on exertion, edema or irregular heartbeat Gastrointestinal ROS: negative for - abdominal pain, diarrhea, hematemesis, nausea/vomiting or stool incontinence Genito-Urinary ROS: negative for - dysuria, hematuria, incontinence or urinary frequency/urgency Musculoskeletal ROS: negative for - joint swelling or muscular weakness Neurological ROS: as noted in HPI Dermatological ROS: negative for rash and skin lesion changes  Neurologic Examination:                                                                                                      Blood pressure 105/54, pulse 83, resp. rate 17, weight 57.8 kg (127 lb 6.8 oz), SpO2 99 %.  HEENT-  Normocephalic, no lesions, without  obvious abnormality.  Normal external eye and conjunctiva.  Normal TM's bilaterally.  Normal auditory canals and external ears. Normal external nose, mucus membranes and septum.  Normal pharynx. Cardiovascular- irregularly irregular rhythm, pulses palpable throughout   Lungs- chest clear, no wheezing, rales, normal symmetric air entry Abdomen- normal findings: bowel sounds normal Extremities- no edema Lymph-no adenopathy palpable Musculoskeletal-no joint tenderness, deformity or swelling Skin-warm and dry, no hyperpigmentation, vitiligo, or suspicious lesions  Neurological Examination Mental Status: Alert, oriented, thought content appropriate.  Speech dysarthric without evidence of aphasia.  Able to follow 3 step commands without difficulty. Cranial Nerves: II: Discs flat bilaterally; Visual fields grossly normal, pupils equal, round, reactive to light and accommodation III,IV, VI: ptosis not present, extra-ocular motions intact bilaterally V,VII: smile asymmetric on the right, facial light touch sensation normal bilaterally VIII: hearing normal bilaterally IX,X: uvula rises symmetrically XI: bilateral shoulder shrug XII: midline tongue extension Motor: Right : Upper extremity   5/5    Left:     Upper extremity   3/5 proximally, she has 1/5 elbow extension and no movement in her hand.   Lower extremity   5/5     Lower extremity   4/5 Tone and bulk:normal tone throughout; no atrophy noted Sensory: Pinprick and light touch intact throughout, bilaterally Deep Tendon Reflexes: 2+ and symmetric throughout Plantars: Right: up going   Left: downgoing Cerebellar: normal finger-to-nose on left and consistent with weakness on right.  Normal heel-to-shin test on the left and right.  Gait: not tested due to safety       Lab Results: Basic Metabolic Panel:  Recent Labs Lab 05/08/14 1653  NA 139  K 4.8  CL 104  GLUCOSE 131*  BUN 31*  CREATININE 1.70*    Liver Function Tests: No  results for input(s): AST, ALT, ALKPHOS, BILITOT, PROT, ALBUMIN in the last 168 hours. No results for input(s): LIPASE, AMYLASE in the last 168 hours. No results for input(s): AMMONIA in the last 168 hours.  CBC:  Recent Labs Lab 05/08/14 1653  HGB 15.6*  HCT 46.0    Cardiac Enzymes: No results for input(s):  CKTOTAL, CKMB, CKMBINDEX, TROPONINI in the last 168 hours.  Lipid Panel: No results for input(s): CHOL, TRIG, HDL, CHOLHDL, VLDL, LDLCALC in the last 168 hours.  CBG:  Recent Labs Lab 05/08/14 1644  GLUCAP 122*    Microbiology: Results for orders placed or performed during the hospital encounter of 12/30/09  Urine culture     Status: None   Collection Time: 12/30/09  5:15 PM  Result Value Ref Range Status   Specimen Description URINE, RANDOM  Final   Special Requests NONE  Final   Culture  Setup Time [id number]  Final   Colony Count 60,000 COLONIES/ML  Final   Culture   Final    Multiple bacterial morphotypes present, none predominant. Suggest appropriate recollection if clinically indicated.   Report Status 12/31/2009 FINAL  Final  MRSA PCR Screening     Status: None   Collection Time: 12/31/09 12:38 AM  Result Value Ref Range Status   MRSA by PCR  NEGATIVE Final    NEGATIVE        The GeneXpert MRSA Assay (FDA approved for NASAL specimens only), is one component of a comprehensive MRSA colonization surveillance program. It is not intended to diagnose MRSA infection nor to guide or monitor treatment for MRSA infections.    Coagulation Studies: No results for input(s): LABPROT, INR in the last 72 hours.  Imaging: Ct Head Wo Contrast  05/08/2014   CLINICAL DATA:  Right-sided weakness. 81 year old female. Code stroke. Slurred speech, confusion, and right-sided weakness began low 1 hr ago.  EXAM: CT HEAD WITHOUT CONTRAST  TECHNIQUE: Contiguous axial images were obtained from the base of the skull through the vertex without intravenous contrast.   COMPARISON:  CT head without contrast 10/18/2011.  FINDINGS: Mild atrophy and white matter disease is stable. Wall no acute cortical infarct, hemorrhage, or mass lesion is present. The basal ganglia are intact. The insular ribbon is intact.  Ventricles are proportionate to the degree of atrophy. No significant extraaxial fluid collection is present.  Will the paranasal sinuses and mastoid air cells are clear. Atherosclerotic calcifications are present at the dural margin of vertebral arteries as well as the cavernous internal carotid arteries bilaterally.  ASPECTS score = 10/10  [place] Stroke Program Early CT Score  Normal score = 10  IMPRESSION: 1. Stable atrophy and white matter disease. 2. No acute intracranial abnormality. 3. Atherosclerosis. These results were called by telephone at the time of interpretation on 05/08/2014 at 5:00 pm to Dr. Ritta SlotMCNEILL [name] , who verbally acknowledged these results.   Electronically Signed   By: Marin Robertshristopher  Mattern M.D.   On: 05/08/2014 17:00       Assessment and plan discussed with with attending physician and they are in agreement.    Felicie MornDavid Smith PA-C [facility] [phone]  05/08/2014, 5:04 PM   Assessment: 81 y.o. female with atrial fibrillation and likely small infarct. She does have small vessel risk factors as well as afib without anticoagulation. She will not be a candidate for MRI due to pacemaker placement.   She may be a candidate for eliquis long term.   Stroke Risk Factors - atrial fibrillation, diabetes mellitus and hypertension  1. HgbA1c, fasting lipid panel 2. Repeat CT at 24 hours. Will hydrate overnight prior to consideration of CTA.  3. Frequent neuro checks 4. Echocardiogram 5. Carotid dopplers 6. Prophylactic therapy - none for 24 hours due to  7. Risk factor modification 8. Telemetry monitoring 9. PT consult, OT consult, Speech consult  This  patient is critically ill and at significant risk of neurological  worsening, death and care requires constant monitoring of vital signs, hemodynamics,respiratory and cardiac monitoring, neurological assessment, discussion with family, other specialists and medical decision making of high complexity. I spent 45 minutes of neurocritical care time  in the care of  this patient.  Ritta Slot, MD [facility] [phone]  If 7pm- 7am, please page neurology on call as listed in AMION. 05/08/2014  5:29 PM

## 2014-05-08 NOTE — ED Notes (Signed)
Per EMS- pt was standing at the desk at the facility. Pt has sudden onset of slurred speech confusion rt sided weakness and facial droop. Confusion resolved at this time. Pt alert and oriented.

## 2014-05-08 NOTE — ED Provider Notes (Signed)
CSN: [id number]     Arrival date & time 05/08/14  1640 History   First MD Initiated Contact with Patient 05/08/14 1643     No chief complaint on file.    (Consider location/radiation/quality/duration/timing/severity/associated sxs/prior Treatment) HPI Comments: 81 yo female with sudden onset slurred speech and right sided arm/leg weakness.  Occurred while she was having a conversation with some people.    Patient is a 81 y.o. female presenting with neurologic complaint.  Neurologic Problem This is a new problem. The current episode started less than 1 hour ago. The problem occurs constantly. The problem has not changed since onset.Nothing aggravates the symptoms. Nothing relieves the symptoms.    Past Medical History  Diagnosis Date  . Pure hypercholesterolemia   . Hyperlipidemia     Tolerating medication well as of 11/27/12  . Hypothyroidism   . Carpal tunnel syndrome   . Benign hypertensive heart disease without heart failure   . Coronary atherosclerosis of native coronary artery   . Spinal stenosis of lumbar region   . Osteopenia   . Postsurgical percutaneous transluminal coronary angioplasty status   . Essential hypertension, benign   . Benign hypertensive kidney disease with chronic kidney disease stage I through stage IV, or unspecified   . Diabetes mellitus with chronic kidney disease   . NSTEMI (non-ST elevated myocardial infarction) 09/21/09    BM stent RCA  . Atrial fibrillation   . Tachy-brady syndrome     s/p PPM, battery change 08/01/09  . Sinoatrial node dysfunction   . Chronic anemia   . Osteoarthritis   . H/O echocardiogram 07/19/09    EF = 60-65%  . Hyperglycemia     Mild  . Microalbuminuria 02/2010  . Vitamin D deficiency 10/2011  . Chronic renal disease, stage III    Past Surgical History  Procedure Laterality Date  . Carpal tunnel release Right 02/07/09  . Vesicovaginal fistula closure w/ tah    . Oophorectomy      With hysterectomy. One ovary  removed-fibroids.  . Pacemaker insertion  2004    Gen change 08/04/09 by Dr. Amil Amen. New pacing generator is a Medtronic EnRhythm, model D1939726, serial [id number]  . Pacemaker generator change  08/04/09    By Dr. Amil Amen. New pacing generator is a Medtronic EnRhythm, model D1939726, serial [id number]  . Percutaneous coronary stent intervention (pci-s)  05/08/02    (3 tandem Cypher stent), proximal and mid LAD  . Coronary angioplasty with stent placement  09/21/09    By Dr. Amil Amen. ACS, NSTEMI - BM stent RCA   Family History  Problem Relation Age of Onset  . Heart disease Mother   . Heart disease Father   . Diabetes Father   . Peripheral vascular disease Brother   . Cerebral palsy Son    History  Substance Use Topics  . Smoking status: Never Smoker   . Smokeless tobacco: Not on file  . Alcohol Use: Yes     Comment: occasional glass of wine   OB History    No data available     Review of Systems  All other systems reviewed and are negative.     Allergies  Penicillins and Sulfa antibiotics  Home Medications   Prior to Admission medications   Medication Sig Start Date End Date Taking? Authorizing Provider  aspirin 325 MG tablet Take 325 mg by mouth daily.    Historical Provider, MD  levothyroxine (SYNTHROID) 50 MCG tablet Take 25 mcg by mouth daily before breakfast.  Historical Provider, MD  MULTAQ 400 MG tablet TAKE 1 TABLET BY MOUTH TWICE DAILY 01/03/14   Corky Crafts, MD  nitroGLYCERIN (NITROSTAT) 0.4 MG SL tablet Place 0.4 mg under the tongue every 5 (five) minutes as needed for chest pain (MAX 3 TABLETS).     Historical Provider, MD  rosuvastatin (CRESTOR) 5 MG tablet Take 5 mg by mouth daily.    Historical Provider, MD  ZETIA 10 MG tablet TAKE 1 TABLET BY MOUTH ONCE DAILY 04/03/14   Corky Crafts, MD   BP 105/54 mmHg  Pulse 83  Resp 17  Wt 127 lb 6.8 oz (57.8 kg)  SpO2 99% Physical Exam  Constitutional: She is oriented to person, place, and  time. She appears well-developed and well-nourished. No distress.  HENT:  Head: Normocephalic and atraumatic.  Mouth/Throat: Oropharynx is clear and moist.  Eyes: Conjunctivae are normal. Pupils are equal, round, and reactive to light. No scleral icterus.  Neck: Neck supple.  Cardiovascular: Normal rate, regular rhythm, normal heart sounds and intact distal pulses.   No murmur heard. Pulmonary/Chest: Effort normal and breath sounds normal. No stridor. No respiratory distress. She has no rales.  Abdominal: Soft. Bowel sounds are normal. She exhibits no distension. There is no tenderness.  Musculoskeletal: Normal range of motion.  Neurological: She is alert and oriented to person, place, and time. A cranial nerve deficit (slurred speech, right sided facial weakness) is present. No sensory deficit. GCS eye subscore is 4. GCS verbal subscore is 5. GCS motor subscore is 6.  Decreased RUE and RLE strength.  LUE and LLE strength intact.    Skin: Skin is warm and dry. No rash noted.  Psychiatric: She has a normal mood and affect. Her behavior is normal.  Nursing note and vitals reviewed.   ED Course  CRITICAL CARE Performed by: Blake Divine Authorized by: Blake Divine Total critical care time: 30 minutes Critical care time was exclusive of separately billable procedures and treating other patients. Critical care was necessary to treat or prevent imminent or life-threatening deterioration of the following conditions: CNS failure or compromise. Critical care was time spent personally by me on the following activities: development of treatment plan with patient or surrogate, discussions with consultants, evaluation of patient's response to treatment, examination of patient, obtaining history from patient or surrogate, ordering and performing treatments and interventions, ordering and review of laboratory studies, ordering and review of radiographic studies, pulse oximetry, re-evaluation of patient's  condition and review of old charts.   (including critical care time) Labs Review Labs Reviewed  CBC - Abnormal; Notable for the following:    WBC 11.6 (*)    All other components within normal limits  COMPREHENSIVE METABOLIC PANEL - Abnormal; Notable for the following:    Glucose, Bld 129 (*)    BUN 27 (*)    Creatinine, Ser 1.61 (*)    AST 38 (*)    GFR calc non Af Amer 26 (*)    GFR calc Af Amer 31 (*)    All other components within normal limits  URINALYSIS, ROUTINE W REFLEX MICROSCOPIC - Abnormal; Notable for the following:    APPearance HAZY (*)    Hgb urine dipstick SMALL (*)    Leukocytes, UA MODERATE (*)    All other components within normal limits  URINE MICROSCOPIC-ADD ON - Abnormal; Notable for the following:    Squamous Epithelial / LPF FEW (*)    All other components within normal limits  CBG MONITORING, ED -  Abnormal; Notable for the following:    Glucose-Capillary 122 (*)    All other components within normal limits  I-STAT CHEM 8, ED - Abnormal; Notable for the following:    BUN 31 (*)    Creatinine, Ser 1.70 (*)    Glucose, Bld 131 (*)    Hemoglobin 15.6 (*)    All other components within normal limits  I-STAT CHEM 8, ED - Abnormal; Notable for the following:    BUN 35 (*)    Creatinine, Ser 1.60 (*)    Glucose, Bld 129 (*)    Hemoglobin 16.0 (*)    HCT 47.0 (*)    All other components within normal limits  MRSA PCR SCREENING  ETHANOL  DIFFERENTIAL  URINE RAPID DRUG SCREEN (HOSP PERFORMED)  HEMOGLOBIN A1C  LIPID PANEL  PROTIME-INR  APTT  BASIC METABOLIC PANEL  CBC  I-STAT TROPOININ, ED  I-STAT TROPOININ, ED    Imaging Review Ct Head Wo Contrast  05/08/2014   CLINICAL DATA:  Right-sided weakness. 81 year old female. Code stroke. Slurred speech, confusion, and right-sided weakness began low 1 hr ago.  EXAM: CT HEAD WITHOUT CONTRAST  TECHNIQUE: Contiguous axial images were obtained from the base of the skull through the vertex without intravenous  contrast.  COMPARISON:  CT head without contrast 10/18/2011.  FINDINGS: Mild atrophy and white matter disease is stable. Wall no acute cortical infarct, hemorrhage, or mass lesion is present. The basal ganglia are intact. The insular ribbon is intact.  Ventricles are proportionate to the degree of atrophy. No significant extraaxial fluid collection is present.  Will the paranasal sinuses and mastoid air cells are clear. Atherosclerotic calcifications are present at the dural margin of vertebral arteries as well as the cavernous internal carotid arteries bilaterally.  ASPECTS score = 10/10  [place] Stroke Program Early CT Score  Normal score = 10  IMPRESSION: 1. Stable atrophy and white matter disease. 2. No acute intracranial abnormality. 3. Atherosclerosis. These results were called by telephone at the time of interpretation on 05/08/2014 at 5:00 pm to Dr. Ritta SlotMCNEILL KIRKPATRICK , who verbally acknowledged these results.   Electronically Signed   By: Marin Robertshristopher  Mattern M.D.   On: 05/08/2014 17:00     EKG Interpretation   Date/Time:  Wednesday May 08 2014 16:56:54 EDT Ventricular Rate:  77 PR Interval:    QRS Duration: 105 QT Interval:  393 QTC Calculation: 445 R Axis:   37 Text Interpretation:  Atrial fibrillation Abnormal T, consider ischemia,  lateral leads compared to prior, a fib has replaced atrial paced rhythm  Confirmed by [facility]  MD, TREY ([id number]) on 05/08/2014 5:24:18 PM      MDM   Final diagnoses:  CVA (cerebral infarction)    81 yo female presenting as a code stroke.  Seen and evaluated immediately upon arrival to ED.  Had obvious neuro deficits.  CT negative for hemorrhage or visible acute infarct.  Discussed with Dr. Amada JupiterKirkpatrick (Neurology) who also evaluated her immediately upon arrival.  Given constellation of symptoms and early arrival, it was felt that she would likely benefit from tPA.  Risks/Benefits were discussed with her and family by Dr. Amada JupiterKirkpatrick.  She was given tPA  and admitted to Neuro ICU by neurology.    Blake DivineJohn Bynum Mccullars, MD 05/08/14 [phone]

## 2014-05-09 ENCOUNTER — Encounter (HOSPITAL_COMMUNITY): Payer: Self-pay | Admitting: Neurology

## 2014-05-09 ENCOUNTER — Inpatient Hospital Stay (HOSPITAL_COMMUNITY): Payer: Medicare Other

## 2014-05-09 DIAGNOSIS — G936 Cerebral edema: Secondary | ICD-10-CM

## 2014-05-09 DIAGNOSIS — Z789 Other specified health status: Secondary | ICD-10-CM

## 2014-05-09 DIAGNOSIS — Z9282 Status post administration of tPA (rtPA) in a different facility within the last 24 hours prior to admission to current facility: Secondary | ICD-10-CM | POA: Insufficient documentation

## 2014-05-09 DIAGNOSIS — I639 Cerebral infarction, unspecified: Secondary | ICD-10-CM | POA: Insufficient documentation

## 2014-05-09 DIAGNOSIS — I619 Nontraumatic intracerebral hemorrhage, unspecified: Secondary | ICD-10-CM

## 2014-05-09 LAB — BASIC METABOLIC PANEL
ANION GAP: 10 (ref 5–15)
BUN: 18 mg/dL (ref 6–23)
CHLORIDE: 104 mmol/L (ref 96–112)
CO2: 25 mmol/L (ref 19–32)
CREATININE: 1.36 mg/dL — AB (ref 0.50–1.10)
Calcium: 8.9 mg/dL (ref 8.4–10.5)
GFR calc non Af Amer: 32 mL/min — ABNORMAL LOW (ref >90)
GFR, EST AFRICAN AMERICAN: 38 mL/min — AB (ref >90)
Glucose, Bld: 174 mg/dL — ABNORMAL HIGH (ref 70–99)
Potassium: 4.3 mmol/L (ref 3.5–5.1)
SODIUM: 139 mmol/L (ref 135–145)

## 2014-05-09 LAB — CBC
HCT: 42.6 % (ref 36.0–46.0)
Hemoglobin: 14.2 g/dL (ref 12.0–15.0)
MCH: 30.4 pg (ref 26.0–34.0)
MCHC: 33.3 g/dL (ref 30.0–36.0)
MCV: 91.2 fL (ref 78.0–100.0)
PLATELETS: 221 10*3/uL (ref 150–400)
RBC: 4.67 MIL/uL (ref 3.87–5.11)
RDW: 14.3 % (ref 11.5–15.5)
WBC: 12.2 10*3/uL — ABNORMAL HIGH (ref 4.0–10.5)

## 2014-05-09 LAB — GLUCOSE, CAPILLARY
Glucose-Capillary: 135 mg/dL — ABNORMAL HIGH (ref 70–99)
Glucose-Capillary: 156 mg/dL — ABNORMAL HIGH (ref 70–99)
Glucose-Capillary: 125 mg/dL — ABNORMAL HIGH (ref 70–99)
Glucose-Capillary: 219 mg/dL — ABNORMAL HIGH (ref 70–99)
Glucose-Capillary: 162 mg/dL — ABNORMAL HIGH (ref 70–99)

## 2014-05-09 LAB — LIPID PANEL
Cholesterol: 137 mg/dL (ref 0–200)
HDL: 47 mg/dL (ref >39)
LDL Cholesterol: 74 mg/dL (ref 0–99)
Total CHOL/HDL Ratio: 2.9 RATIO
Triglycerides: 79 mg/dL (ref <150)
VLDL: 16 mg/dL (ref 0–40)

## 2014-05-09 MED ORDER — PERFLUTREN LIPID MICROSPHERE
1.0000 mL | INTRAVENOUS | Status: DC | PRN
  Filled 2014-05-09: qty 10

## 2014-05-09 MED ORDER — HYDRALAZINE HCL 20 MG/ML IJ SOLN
10.0000 mg | Freq: Three times a day (TID) | INTRAMUSCULAR | Status: DC | PRN
  Administered 2014-05-09: 10 mg via INTRAVENOUS
  Filled 2014-05-09: qty 1

## 2014-05-09 MED ORDER — DONEPEZIL HCL 5 MG PO TABS
5.0000 mg | ORAL_TABLET | Freq: Every day | ORAL | Status: DC
  Administered 2014-05-09 – 2014-05-12 (×4): 5 mg via ORAL
  Filled 2014-05-09 (×5): qty 1

## 2014-05-09 MED ORDER — DILTIAZEM HCL ER COATED BEADS 120 MG PO CP24
120.0000 mg | ORAL_CAPSULE | Freq: Every day | ORAL | Status: DC
  Administered 2014-05-09 – 2014-05-13 (×5): 120 mg via ORAL
  Filled 2014-05-09 (×5): qty 1

## 2014-05-09 MED ORDER — LABETALOL HCL 5 MG/ML IV SOLN
10.0000 mg | INTRAVENOUS | Status: DC | PRN
  Administered 2014-05-09 (×2): 20 mg via INTRAVENOUS
  Administered 2014-05-09: 10 mg via INTRAVENOUS
  Filled 2014-05-09 (×3): qty 4

## 2014-05-09 NOTE — Progress Notes (Signed)
PT Cancellation Note  Patient Details Name: [name] MRN: [id number] DOB: [birth year]   Cancelled Treatment:    Reason Eval/Treat Not Completed: Patient not medically ready.  Pt currently on strict bedrest post-tpa.  Will hold PT and mobility at this time and f/u as appropriate.     Chesney Suares, Alison MurrayMegan F 05/09/2014, 7:50 AM

## 2014-05-09 NOTE — Progress Notes (Signed)
UR completed.  Facility resident PTA.  Likely to return to same once stable.  Carlyle LipaMichelle Aspyn Warnke, RN BSN MHA CCM Trauma/Neuro ICU Case Manager [phone]

## 2014-05-09 NOTE — Evaluation (Signed)
Speech Language Pathology Evaluation Patient Details Name: [name] MRN: [id number] DOB: [birth year] Today's Date: 05/09/2014 Time: 4098-1191 SLP Time Calculation (min) (ACUTE ONLY): 16 min  Problem List:  Patient Active Problem List   Diagnosis Date Noted  . Cerebral hemorrhage   . Received intravenous tissue plasminogen activator (tPA) in emergency department   . Stroke   . Cytotoxic brain edema   . CVA (cerebral infarction) 05/08/2014  . Tachy-brady syndrome   . Encounter for long-term (current) use of other medications   . CAD in native artery   . Atrial fibrillation   . Hypothyroidism   . Cardiac pacemaker in situ   . Essential hypertension, benign   . Pure hypercholesterolemia   . Sinoatrial node dysfunction    Past Medical History:  Past Medical History  Diagnosis Date  . Pure hypercholesterolemia   . Hyperlipidemia     Tolerating medication well as of 11/27/12  . Hypothyroidism   . Carpal tunnel syndrome   . Benign hypertensive heart disease without heart failure   . Coronary atherosclerosis of native coronary artery   . Spinal stenosis of lumbar region   . Osteopenia   . Postsurgical percutaneous transluminal coronary angioplasty status   . Essential hypertension, benign   . Benign hypertensive kidney disease with chronic kidney disease stage I through stage IV, or unspecified   . Diabetes mellitus with chronic kidney disease   . NSTEMI (non-ST elevated myocardial infarction) 09/21/09    BM stent RCA  . Atrial fibrillation   . Tachy-brady syndrome     s/p PPM, battery change 08/01/09  . Sinoatrial node dysfunction   . Chronic anemia   . Osteoarthritis   . H/O echocardiogram 07/19/09    EF = 60-65%  . Hyperglycemia     Mild  . Microalbuminuria 02/2010  . Vitamin D deficiency 10/2011  . Chronic renal disease, stage III    Past Surgical History:  Past Surgical History  Procedure Laterality Date  . Carpal tunnel release Right 02/07/09  . Vesicovaginal  fistula closure w/ tah    . Oophorectomy      With hysterectomy. One ovary removed-fibroids.  . Pacemaker insertion  2004    Gen change 08/04/09 by Dr. Amil Amen. New pacing generator is a Medtronic EnRhythm, model D1939726, serial [id number]  . Pacemaker generator change  08/04/09    By Dr. Amil Amen. New pacing generator is a Medtronic EnRhythm, model D1939726, serial [id number]  . Percutaneous coronary stent intervention (pci-s)  05/08/02    (3 tandem Cypher stent), proximal and mid LAD  . Coronary angioplasty with stent placement  09/21/09    By Dr. Amil Amen. ACS, NSTEMI - BM stent RCA   HPI:  81 y/o female patient of [facility], admitted with slurred speech, right facial droop and right sided weakness. Diagnosed with left brain MCA branch infarcts s/p IV tPA, embolic secondary to known atrial fibrillation.  Patient with increased hemiparesis, dysarthria, and new onset expressive aphasia overnight 4/20. Stat CT showed left frontal and superficial hemorrhage post tPA. Patient initially passed RN stroke swallow screen on admission but noted to be having difficulty swallowing 4/21.    Assessment / Plan / Recommendation Clinical Impression  Patient presents with a moderate dysarthria and a moderate aphasia impacting both expressive and receptive language, including reading and writing abilitites. Patient will benefit from continued SLP treatment in the acute care setting as well as CIR after d/c to maximize potential. Will f/u.     SLP Assessment  Patient  needs continued Speech Lanaguage Pathology Services    Follow Up Recommendations  Inpatient Rehab    Frequency and Duration min 3x week  2 weeks   Pertinent Vitals/Pain Pain Assessment: No/denies pain   SLP Goals  Potential to Achieve Goals (ACUTE ONLY): Good  SLP Evaluation Prior Functioning  Cognitive/Linguistic Baseline: Information not available   Cognition  Overall Cognitive Status: No family/caregiver present to determine  baseline cognitive functioning Arousal/Alertness: Awake/alert Orientation Level: Oriented to person;Disoriented to time;Disoriented to situation;Oriented to place Attention: Sustained Sustained Attention: Appears intact Memory:  (difficult to assess given severity of aphasia) Awareness: Impaired Awareness Impairment: Intellectual impairment (50% aware of deficits) Problem Solving: Appears intact (for basic problem solving during familiar tasks) Safety/Judgment:  (TBD)    Comprehension  Auditory Comprehension Overall Auditory Comprehension: Impaired Yes/No Questions: Impaired Basic Biographical Questions: 76-100% accurate Basic Immediate Environment Questions: 25-49% accurate Commands: Impaired One Step Basic Commands: 75-100% accurate Two Step Basic Commands: 0-24% accurate Conversation: Simple Other Conversation Comments: comprehension improved during basic familiar conversation in the context of tasks EffectiveTechniques: Visual/Gestural cues Visual Recognition/Discrimination Discrimination: Exceptions to [facility] Common Objects: Unable to indentify Reading Comprehension Reading Status: Impaired Word level: Impaired    Expression Expression Primary Mode of Expression: Verbal Verbal Expression Overall Verbal Expression: Impaired Initiation: No impairment Automatic Speech: Name;Social Response Level of Generative/Spontaneous Verbalization: Phrase Repetition: Impaired Level of Impairment: Word level Naming: Impairment Responsive: 51-75% accurate Confrontation: Impaired Common Objects: Unable to indentify Convergent: 50-74% accurate Divergent: 50-74% accurate Verbal Errors: Semantic paraphasias;Phonemic paraphasias (INCONSISTENTLY AWARE OF ERRORS) Pragmatics: No impairment Effective Techniques: Phonemic cues Written Expression Dominant Hand: Right Written Expression: Exceptions to [facility] Dictation Ability:  (unable) Self Formulation Ability:  (unable)   Oral / Motor Oral  Motor/Sensory Function Overall Oral Motor/Sensory Function: Impaired Labial ROM: Reduced right (improved with spontaneous smile) Labial Symmetry: Abnormal symmetry right Labial Strength: Reduced Labial Sensation: Reduced Lingual ROM: Reduced right Lingual Symmetry: Within Functional Limits Lingual Strength: Reduced Lingual Sensation: Reduced Facial ROM: Reduced right Facial Symmetry: Right droop Facial Strength: Reduced Facial Sensation: Reduced Velum: Within Functional Limits Mandible: Within Functional Limits Motor Speech Overall Motor Speech: Impaired Respiration: Within functional limits Phonation: Normal Resonance: Within functional limits Articulation: Impaired Level of Impairment: Word Intelligibility: Intelligibility reduced Phrase: 75-100% accurate Sentence: 75-100% accurate Motor Planning: Witnin functional limits   GO   Ferdinand LangoLeah Julissa Browning MA, CCC-SLP [phone]   Kapono Luhn Meryl 05/09/2014, 11:25 AM

## 2014-05-09 NOTE — Progress Notes (Signed)
STROKE TEAM PROGRESS NOTE   HISTORY [name] is an 81 y.o. female who resides in [place]. She has a history of Afib on ASA. She was noted to be doing well until 1610 05/08/2014 ([facility]) when she suddenly became confused, had slurred speech, right facial droop and right sided weakness. EMS was called and patient was brought to [facility] as a code stroke. On arrival she was alert and oriented but continued to show slurred speech, right facial droop and right sided weakness. She was within the window for tPA with no contra indications. Decision was made to give tPA. She does have a history of GI bleed which is why she was taken off of coumadin. She denies any bleeding within the past month.  Modified Rankin: Rankin Score=0. Patient was administered TPA. She was admitted to the neuro ICU for further evaluation and treatment.   SUBJECTIVE (INTERVAL HISTORY) Her son and daughter-in law are at the bedside.  Overall she feels her condition is gradually worsening. Patient with increased hemiparesis overnight and dysarthria has increased to more expressive aphasia.   OBJECTIVE Temp:  [97.7 F (36.5 C)-98.2 F (36.8 C)] 97.7 F (36.5 C) (04/21 0800) Pulse Rate:  [45-122] 66 (04/21 0800) Cardiac Rhythm:  [-] Atrial fibrillation;A-V Sequential paced (04/21 0800) Resp:  [11-31] 11 (04/21 0800) BP: (105-203)/(43-89) 145/58 mmHg (04/21 0800) SpO2:  [94 %-100 %] 96 % (04/21 0800) Weight:  [56.6 kg (124 lb 12.5 oz)-57.8 kg (127 lb 6.8 oz)] 56.6 kg (124 lb 12.5 oz) (04/20 2130)   Recent Labs Lab 05/08/14 1644 05/08/14 2126  GLUCAP 122* 180*    Recent Labs Lab 05/08/14 1652 05/08/14 1653 05/08/14 1714 05/09/14 0333  NA 139 139 138 139  K 4.9 4.8 4.9 4.3  CL 103 104 105 104  CO2 23  --   --  25  GLUCOSE 129* 131* 129* 174*  BUN 27* 31* 35* 18  CREATININE 1.61* 1.70* 1.60* 1.36*  CALCIUM 9.4  --   --  8.9    Recent Labs Lab 05/08/14 1652  AST 38*  ALT 29  ALKPHOS 63  BILITOT  0.6  PROT 7.1  ALBUMIN 3.6    Recent Labs Lab 05/08/14 1652 05/08/14 1653 05/08/14 1714 05/09/14 0333  WBC 11.6*  --   --  12.2*  NEUTROABS 7.6  --   --   --   HGB 14.9 15.6* 16.0* 14.2  HCT 44.1 46.0 47.0* 42.6  MCV 91.5  --   --  91.2  PLT 221  --   --  221   No results for input(s): CKTOTAL, CKMB, CKMBINDEX, TROPONINI in the last 168 hours.  Recent Labs  05/08/14 2100  LABPROT 14.7  INR 1.13    Recent Labs  05/08/14 1725  COLORURINE YELLOW  LABSPEC 1.008  PHURINE 7.5  GLUCOSEU NEGATIVE  HGBUR SMALL*  BILIRUBINUR NEGATIVE  KETONESUR NEGATIVE  PROTEINUR NEGATIVE  UROBILINOGEN 0.2  NITRITE NEGATIVE  LEUKOCYTESUR MODERATE*       Component Value Date/Time   CHOL 137 05/09/2014 0333   TRIG 79 05/09/2014 0333   HDL 47 05/09/2014 0333   CHOLHDL 2.9 05/09/2014 0333   VLDL 16 05/09/2014 0333   LDLCALC 74 05/09/2014 0333   Lab Results  Component Value Date   HGBA1C * 09/22/2009    6.4 (NOTE)  According to the ADA Clinical Practice Recommendations for 2011, when HbA1c is used as a screening test:   >=6.5%   Diagnostic of Diabetes Mellitus           (if abnormal result  is confirmed)  5.7-6.4%   Increased risk of developing Diabetes Mellitus  References:Diagnosis and Classification of Diabetes Mellitus,Diabetes Care,2011,34(Suppl 1):S62-S69 and Standards of Medical Care in         Diabetes - 2011,Diabetes Care,2011,34  (Suppl 1):S11-S61.      Component Value Date/Time   LABOPIA NONE DETECTED 05/08/2014 1725   COCAINSCRNUR NONE DETECTED 05/08/2014 1725   LABBENZ NONE DETECTED 05/08/2014 1725   AMPHETMU NONE DETECTED 05/08/2014 1725   THCU NONE DETECTED 05/08/2014 1725   LABBARB NONE DETECTED 05/08/2014 1725     Recent Labs Lab 05/08/14 1659  ETH <5    Ct Head Wo Contrast  05/09/2014   CLINICAL DATA:  Worsening NIHSS score. Evaluate for hemorrhagic transformation of cerebral  infarction. History of atrial fibrillation. History of IV tPA administration. Subsequent encounter.  EXAM: CT HEAD WITHOUT CONTRAST  TECHNIQUE: Contiguous axial images were obtained from the base of the skull through the vertex without intravenous contrast.  COMPARISON:  05/08/2014.  FINDINGS: Multiple new parenchymal and subarachnoid hemorrhages since yesterday's scan. These include  -LEFT frontal cortex, 23 x 25 mm, slight hematocrit level; image 17.  -RIGHT parietal subarachnoid space near the vertex; image 19.  -LEFT posterior frontal cortex and subarachnoid central sulcus, 6 x 7 mm parenchymal, images 21-22.  Areas of nonhemorrhagic cytotoxic edema affect the LEFT precentral and postcentral gyri as seen on images 19 and 20. None of these areas were visible initially on the scan of 05/08/2014.  Stable mild atrophy.  Stable chronic microvascular ischemic change.  Moderate vascular calcification. Moderate hyperostosis. BILATERAL cataract extraction. No sinus or mastoid disease.  IMPRESSION: Multiple new parenchymal and subarachnoid bleeds since yesterday scan. See discussion above.  Critical Value/emergent results were called by telephone at the time of interpretation on 05/09/2014 at 9:46 am to Dr. Annie Main, who verbally acknowledged these results.   Electronically Signed   By: Davonna Belling M.D.   On: 05/09/2014 09:47   Ct Head Wo Contrast  05/08/2014   CLINICAL DATA:  Right-sided weakness. 81 year old female. Code stroke. Slurred speech, confusion, and right-sided weakness began low 1 hr ago.  EXAM: CT HEAD WITHOUT CONTRAST  TECHNIQUE: Contiguous axial images were obtained from the base of the skull through the vertex without intravenous contrast.  COMPARISON:  CT head without contrast 10/18/2011.  FINDINGS: Mild atrophy and white matter disease is stable. Wall no acute cortical infarct, hemorrhage, or mass lesion is present. The basal ganglia are intact. The insular ribbon is intact.  Ventricles are  proportionate to the degree of atrophy. No significant extraaxial fluid collection is present.  Will the paranasal sinuses and mastoid air cells are clear. Atherosclerotic calcifications are present at the dural margin of vertebral arteries as well as the cavernous internal carotid arteries bilaterally.  ASPECTS score = 10/10  [place] Stroke Program Early CT Score  Normal score = 10  IMPRESSION: 1. Stable atrophy and white matter disease. 2. No acute intracranial abnormality. 3. Atherosclerosis. These results were called by telephone at the time of interpretation on 05/08/2014 at 5:00 pm to Dr. Ritta Slot , who verbally acknowledged these results.   Electronically Signed   By: Marin Roberts M.D.   On: 05/08/2014 17:00     PHYSICAL EXAM Pleasant frail elderly  caucasian lady not in distress. . Afebrile. Head is nontraumatic. Neck is supple without bruit.    Cardiac exam no murmur or gallop but irregular heart sounds. Lungs are clear to auscultation. Distal pulses are well felt. Neurological Exam : Awake alert moderate dysarthria and mild expressive aphasia with difficulty in repetition and naming. Good comprehension. Follows commands well. Pupils irregular but reactive. Vision acuity seems adequate. Blinks to threat bilaterally. Moderate right lower facial weakness. Tongue midline. Motor system exam right upper extremity 2/5 proximally at the shoulder only and 0/5 at the elbow hand and wrist. Right lower extremity no drift but minimum 4+/5 weakness of the hip flexors and ankle dorsiflexors. Normal strength on the left. Mild right hemi-sensory loss. Coordination normal in the lower extremity is and cannot be tested in the right upper extremity. Right plantar is local left downgoing.  NIH stroke scale 8 ASSESSMENT/PLAN [name] is a 81 y.o. female with history of atrial fibrillation on aspirin presenting with confusion, slurred speech, right facial droop and right hemiparesis. She did  receive IV t-PA 05/08/2014 at 1708.   Stroke:  Dominant left brain MCA branch infarcts s/p IV tPA, embolic secondary to known atrial fibrillation   Resultant  R hemiparesis, dysarthria and mild expressive aphasia  Overnight weaker R arm with increased expressive aphasia. Stat CT shows left frontal and superficial hemorrhage post tPA that likely occurred during the night. No indication for anticoagulation reversal protocol.   MRI  / MRA  Pacer  Will cancel Post tPA CT this afternoon in lieu of a CTA in am after hydrated in Creatine down  Carotid Doppler  pending   2D Echo  pending   SCDs for VTE prophylaxis  Diet Carb Modified Fluid consistency:: Thin; Room service appropriate?: Yes  aspirin 325 mg orally every day prior to admission, now on no antithrombotics as within 24h of tPA. Will not receive antithrombotic by end of day 2 d/t post tPA hemorrhage  Ongoing aggressive stroke risk factor management  Therapy recommendations:  Pending. Keep in bed today due to neuro worsening  Disposition:  Pending. Anticipate CIR vs SNF [facility]  Atrial Fibrillation  Not on anticoagulation PTA secondary to hx GIB  May consider NOAC once hemorrhage resolves   Hypertension  Stable this am.   As high as 203/71 during the night. Received 1 dose of labetalol Given hemorrhage, will lower SBP goal to 120-140 over the next 24h  Hyperlipidemia  Home meds:  Zetia 10 mg & Crestor 5mg , resumed in hospital  LDL 74, goal < 70  Continue statin at discharge  Diabetes, type II  HgbA1c pending , goal < 7.0  Other Stroke Risk Factors  Advanced age  ETOH use  Coronary artery disease - NSTEMI, PTCA, stent 2011  Other Active Problems  Tachy brady syndrome s/p pacer  CKD stage 3. Cr 1.36. Will continue hydration. Check labs in am.  Dr. Pearlean BrownieSethi discussed diagnosis, prognosis,  treatment options and plan of care with pt, son and daughter-in-law. Dr. Pearlean BrownieSethi to  meet them again on the neuro ICU tomorrow at 8a.    Hospital day # 1  I have personally examined this patient, reviewed notes, independently viewed imaging studies, participated in medical decision making and plan of care. I have made any additions or clarifications directly to the above note. Agree with note above.  She presented with sudden onset of dysarthria and right face and upper extremity weakness possibly left brain subcortical  Versus  MCA branch infarct but unfortunately has had a post tPA left frontal  parenchymal and  Small right pareital and left frontal subarachnoid hemorrhage with worsening of neurological exam. She remains at risk for recurrent strokes, and neurological worsening and needs aggressive blood pressure control and close neurological monitoring. I had a long discussion with her son and Dr. Newell Coral and daughter-in-law and answered questions This patient is critically ill and at significant risk of neurological worsening, death and care requires constant monitoring of vital signs, hemodynamics,respiratory and cardiac monitoring,review of multiple databases, neurological assessment, discussion with family, other specialists and medical decision making of high complexity.I have made any additions or clarifications directly to the above note.  I spent 60 minutes of neurocritical care time  in the care of  this patient.  Delia Heady, MD Medical Director [facility] St[facility] Center Pager: [phone] 05/09/2014 9:56 AM   To contact Stroke Continuity provider, please refer to [email or web address]. After hours, contact General Neurology

## 2014-05-09 NOTE — Progress Notes (Signed)
Klonopin administered post [name] complaining of spasms in her lower legs.

## 2014-05-09 NOTE — Progress Notes (Signed)
Echocardiogram 2D Echocardiogram has been performed.  Dorothey BasemanReel, Jentry Warnell M 05/09/2014, 9:52 AM

## 2014-05-09 NOTE — Evaluation (Signed)
Clinical/Bedside Swallow Evaluation Patient Details  Name: [name] MRN: [id number] Date of Birth: 13-Oct-1918  Today's Date: 05/09/2014 Time: SLP Start Time (ACUTE ONLY): 1030 SLP Stop Time (ACUTE ONLY): 1048 SLP Time Calculation (min) (ACUTE ONLY): 18 min  Past Medical History:  Past Medical History  Diagnosis Date  . Pure hypercholesterolemia   . Hyperlipidemia     Tolerating medication well as of 11/27/12  . Hypothyroidism   . Carpal tunnel syndrome   . Benign hypertensive heart disease without heart failure   . Coronary atherosclerosis of native coronary artery   . Spinal stenosis of lumbar region   . Osteopenia   . Postsurgical percutaneous transluminal coronary angioplasty status   . Essential hypertension, benign   . Benign hypertensive kidney disease with chronic kidney disease stage I through stage IV, or unspecified   . Diabetes mellitus with chronic kidney disease   . NSTEMI (non-ST elevated myocardial infarction) 09/21/09    BM stent RCA  . Atrial fibrillation   . Tachy-brady syndrome     s/p PPM, battery change 08/01/09  . Sinoatrial node dysfunction   . Chronic anemia   . Osteoarthritis   . H/O echocardiogram 07/19/09    EF = 60-65%  . Hyperglycemia     Mild  . Microalbuminuria 02/2010  . Vitamin D deficiency 10/2011  . Chronic renal disease, stage III    Past Surgical History:  Past Surgical History  Procedure Laterality Date  . Carpal tunnel release Right 02/07/09  . Vesicovaginal fistula closure w/ tah    . Oophorectomy      With hysterectomy. One ovary removed-fibroids.  . Pacemaker insertion  2004    Gen change 08/04/09 by Dr. Amil AmenEdmunds. New pacing generator is a Medtronic EnRhythm, model [id number], serial [id number] H  . Pacemaker generator change  08/04/09    By Dr. Amil AmenEdmunds. New pacing generator is a Medtronic EnRhythm, model [id number], serial [id number] H  . Percutaneous coronary stent intervention (pci-s)  05/08/02    (3 tandem Cypher stent), proximal  and mid LAD  . Coronary angioplasty with stent placement  09/21/09    By Dr. Amil AmenEdmunds. ACS, NSTEMI - BM stent RCA   HPI:  81 y/o female patient of [facility], admitted with slurred speech, right facial droop and right sided weakness. Diagnosed with left brain MCA branch infarcts s/p IV tPA, embolic secondary to known atrial fibrillation.  Patient with increased hemiparesis, dysarthria, and new onset expressive aphasia overnight 4/20. Stat CT showed left frontal and superficial hemorrhage post tPA. Patient initially passed RN stroke swallow screen on admission but noted to be having difficulty swallowing 4/21.    Assessment / Plan / Recommendation Clinical Impression  Patient presents with a moderate oral phase dysphagia characterized by CN VII and XII involvement impacting labial and lingual strength and sensation. Oral phase of swallow characterized by right anterior labial spillage, delayed oral transit, and mild post swallow residuals. Pt is at risk for aspiration given oral phase deficits resulting in possible loss of bolus into the pharynx pre-swallow however at this time, appears to be protecting the airway with no overt s/s of aspiration and what appears to be a timely swallow response. Educated patient on diet recommendations and compensatory strategies including small bites/sips, use of lingual sweep to clear solid residuals prior to consuming liquids. SLP will f/u closely for diet tolerance, follow through with use of strategies, and potential to advance diet.     Aspiration Risk  Moderate    Diet Recommendation Dysphagia 2 (  Fine chop);Thin liquid   Liquid Administration via: Cup;Straw Medication Administration: Crushed with puree Supervision: Patient able to self feed;Full supervision/cueing for compensatory strategies Compensations: Slow rate;Small sips/bites;Check for pocketing (clear solids prior to consuming liquids) Postural Changes and/or Swallow Maneuvers: Seated upright 90  degrees    Other  Recommendations Oral Care Recommendations: Oral care BID   Follow Up Recommendations  Inpatient Rehab    Frequency and Duration min 3x week  2 weeks   Pertinent Vitals/Pain n/a        Swallow Study    General HPI: 81 y/o female patient of [facility], admitted with slurred speech, right facial droop and right sided weakness. Diagnosed with left brain MCA branch infarcts s/p IV tPA, embolic secondary to known atrial fibrillation.  Patient with increased hemiparesis, dysarthria, and new onset expressive aphasia overnight 4/20. Stat CT showed left frontal and superficial hemorrhage post tPA. Patient initially passed RN stroke swallow screen on admission but noted to be having difficulty swallowing 4/21.  Type of Study: Bedside swallow evaluation Previous Swallow Assessment: none noted in chart Diet Prior to this Study: Regular;Thin liquids Temperature Spikes Noted: No Respiratory Status: Room air History of Recent Intubation: No Behavior/Cognition: Alert;Cooperative;Pleasant mood Oral Cavity - Dentition: Adequate natural dentition Self-Feeding Abilities: Able to feed self Patient Positioning: Upright in bed Baseline Vocal Quality: Clear Volitional Cough: Strong Volitional Swallow: Able to elicit    Oral/Motor/Sensory Function Overall Oral Motor/Sensory Function: Impaired Labial ROM: Reduced right (improved with spontaneous smile) Labial Symmetry: Abnormal symmetry right Labial Strength: Reduced Labial Sensation: Reduced Lingual ROM: Reduced right Lingual Symmetry: Within Functional Limits Lingual Strength: Reduced Lingual Sensation: Reduced Facial ROM: Reduced right Facial Symmetry: Right droop Facial Strength: Reduced Facial Sensation: Reduced Velum: Within Functional Limits Mandible: Within Functional Limits   Ice Chips Ice chips: Impaired Presentation: Spoon Oral Phase Impairments: Reduced labial seal Oral Phase Functional Implications: Right  anterior spillage   Thin Liquid Thin Liquid: Impaired Presentation: Cup;Self Fed;Straw Oral Phase Impairments: Reduced labial seal Oral Phase Functional Implications: Right anterior spillage (labial seal improved with use of straw)    Nectar Thick Nectar Thick Liquid: Not tested   Honey Thick Honey Thick Liquid: Not tested   Puree Puree: Impaired Presentation: Spoon Oral Phase Impairments: Reduced labial seal;Impaired anterior to posterior transit;Reduced lingual movement/coordination Oral Phase Functional Implications: Right anterior spillage;Right lateral sulci pocketing;Prolonged oral transit   Solid   GO    Solid: Impaired Presentation: Self Fed Oral Phase Impairments: Reduced labial seal;Reduced lingual movement/coordination;Impaired anterior to posterior transit Oral Phase Functional Implications: Right anterior spillage;Right lateral sulci pocketing;Oral residue      [employer] MA, CCC-SLP [phone]  Ellayna Hilligoss Meryl 05/09/2014,11:17 AM

## 2014-05-10 ENCOUNTER — Inpatient Hospital Stay (HOSPITAL_COMMUNITY): Payer: Medicare Other

## 2014-05-10 ENCOUNTER — Encounter (HOSPITAL_COMMUNITY): Payer: Self-pay | Admitting: Radiology

## 2014-05-10 DIAGNOSIS — I63512 Cerebral infarction due to unspecified occlusion or stenosis of left middle cerebral artery: Secondary | ICD-10-CM

## 2014-05-10 DIAGNOSIS — G8190 Hemiplegia, unspecified affecting unspecified side: Secondary | ICD-10-CM

## 2014-05-10 DIAGNOSIS — I69920 Aphasia following unspecified cerebrovascular disease: Secondary | ICD-10-CM

## 2014-05-10 LAB — CBC
HCT: 41.7 % (ref 36.0–46.0)
Hemoglobin: 13.7 g/dL (ref 12.0–15.0)
MCH: 30.4 pg (ref 26.0–34.0)
MCHC: 32.9 g/dL (ref 30.0–36.0)
MCV: 92.5 fL (ref 78.0–100.0)
Platelets: 208 10*3/uL (ref 150–400)
RBC: 4.51 MIL/uL (ref 3.87–5.11)
RDW: 14.3 % (ref 11.5–15.5)
WBC: 11.5 10*3/uL — ABNORMAL HIGH (ref 4.0–10.5)

## 2014-05-10 LAB — GLUCOSE, CAPILLARY
GLUCOSE-CAPILLARY: 153 mg/dL — AB (ref 70–99)
GLUCOSE-CAPILLARY: 145 mg/dL — AB (ref 70–99)
GLUCOSE-CAPILLARY: 196 mg/dL — AB (ref 70–99)
Glucose-Capillary: 127 mg/dL — ABNORMAL HIGH (ref 70–99)
Glucose-Capillary: 151 mg/dL — ABNORMAL HIGH (ref 70–99)

## 2014-05-10 LAB — BASIC METABOLIC PANEL
ANION GAP: 7 (ref 5–15)
BUN: 13 mg/dL (ref 6–23)
CO2: 23 mmol/L (ref 19–32)
Calcium: 8.6 mg/dL (ref 8.4–10.5)
Chloride: 106 mmol/L (ref 96–112)
Creatinine, Ser: 1.17 mg/dL — ABNORMAL HIGH (ref 0.50–1.10)
GFR calc Af Amer: 45 mL/min — ABNORMAL LOW (ref >90)
GFR calc non Af Amer: 39 mL/min — ABNORMAL LOW (ref >90)
Glucose, Bld: 141 mg/dL — ABNORMAL HIGH (ref 70–99)
POTASSIUM: 4 mmol/L (ref 3.5–5.1)
SODIUM: 136 mmol/L (ref 135–145)

## 2014-05-10 LAB — HEMOGLOBIN A1C
HEMOGLOBIN A1C: 7.5 % — AB (ref 4.8–5.6)
Mean Plasma Glucose: 169 mg/dL

## 2014-05-10 MED ORDER — IOHEXOL 350 MG/ML SOLN
70.0000 mL | Freq: Once | INTRAVENOUS | Status: AC | PRN
  Administered 2014-05-10: 50 mL via INTRAVENOUS

## 2014-05-10 NOTE — Evaluation (Signed)
Occupational Therapy Evaluation Patient Details Name: [name] MRN: [id number] DOB: [birth year] Today's Date: 05/10/2014    History of Present Illness 81 y/o female patient of [facility], admitted with slurred speech, right facial droop and right sided weakness. Diagnosed with left brain MCA branch infarcts s/p IV tPA, embolic secondary to known atrial fibrillation. Patient with increased hemiparesis, dysarthria, and new onset expressive aphasia overnight 4/20. Stat CT showed left frontal and superficial hemorrhage post tPA.   Clinical Impression   This 81 yo female admitted with above presents to acute OT with decreased balance, decreased mobility, decreased awareness of deficits, decreased awareness of RUE, decreased awareness of midline, decreased use of RUE all affecting her ability to care for herself at home. She will benefit from acute OT with follow up OT on CIR.    Follow Up Recommendations  CIR    Equipment Recommendations   (TBD at next venue)       Precautions / Restrictions Precautions Precautions: Fall Restrictions Weight Bearing Restrictions: No      Mobility Bed Mobility Overal bed mobility: Needs Assistance Bed Mobility: Supine to Sit     Supine to sit: Mod assist        Transfers Overall transfer level: Needs assistance Equipment used: 2 person hand held assist Transfers: Sit to/from Stand;Stand Pivot Transfers Sit to Stand: Mod assist;+2 physical assistance Stand pivot transfers: Mod assist;+2 physical assistance            Balance Overall balance assessment: Needs assistance Sitting-balance support: Single extremity supported;Feet supported Sitting balance-Leahy Scale: Zero Sitting balance - Comments: Pt leaning heavily to the left, when attempting to help pt back to midline she asked ,"why are you pushing me" Postural control: Right lateral lean Standing balance support: No upper extremity supported Standing balance-Leahy Scale:  Zero Standing balance comment: Decent strength in legs, but not in trunk or RUE                            ADL Overall ADL's : Needs assistance/impaired Eating/Feeding: Moderate assistance (non-dominant hand and swallowing precautions)   Grooming: Moderate assistance;Bed level   Upper Body Bathing: Moderate assistance;Bed level   Lower Body Bathing: Total assistance;Bed level   Upper Body Dressing : Total assistance;Bed level   Lower Body Dressing: Total assistance;Bed level   Toilet Transfer: Moderate assistance;+2 for physical assistance;Stand-pivot (bed> recliner going to pt's left)   Toileting- Clothing Manipulation and Hygiene: Total assistance (with mod A +2 sit<>stand)               Vision Vision Assessment?: Yes Eye Alignment: Within Functional Limits Ocular Range of Motion: Within Functional Limits Alignment/Gaze Preference: Within Defined Limits Tracking/Visual Pursuits: Able to track stimulus in all quads without difficulty Additional Comments: Pt reports she wears glasses to read, but then when I handed a card to her to read-she attempted to do so but was stating words that were on the card but not in any particular order (when asked if she needed her glasses she said no). She did not attempt to change the orientation of the card or tilt her head.          Pertinent Vitals/Pain Pain Assessment: No/denies pain     Hand Dominance Right   Extremity/Trunk Assessment Upper Extremity Assessment Upper Extremity Assessment: RUE deficits/detail RUE Deficits / Details: trace amount of movement for shoulder shrug, shoulder extension, shoulder flexion, and shoulder internal rotation RUE Coordination: decreased gross motor;decreased  fine motor           Communication Communication Communication: Expressive difficulties;HOH   Cognition Arousal/Alertness: Awake/alert Behavior During Therapy: Impulsive Overall Cognitive Status: No family/caregiver  present to determine baseline cognitive functioning Area of Impairment: Orientation;Memory;Safety/judgement Orientation Level:  (knew the month she was born, but not the day or year; unaware she had had a stroke)   Memory:  (Intially when pt was told we were going to get her up OOB to recliner she was happy, as we started the process she asked a couple of times why we were getting her up)   Safety/Judgement: Decreased awareness of safety;Decreased awareness of deficits                    Home Living Family/patient expects to be discharged to:: Inpatient rehab                                        Prior Functioning/Environment          Comments: Pt reports she was bathing and dressing herself PTA    OT Diagnosis: Generalized weakness;Cognitive deficits;Hemiplegia dominant side;Disturbance of vision   OT Problem List: Decreased strength;Decreased range of motion;Impaired balance (sitting and/or standing);Impaired vision/perception;Decreased coordination;Decreased cognition;Decreased safety awareness;Decreased knowledge of use of DME or AE;Impaired tone;Impaired UE functional use   OT Treatment/Interventions: Self-care/ADL training;Therapeutic exercise;Therapeutic activities;Cognitive remediation/compensation;Neuromuscular education;Visual/perceptual remediation/compensation;Patient/family education;DME and/or AE instruction    OT Goals(Current goals can be found in the care plan section) Acute Rehab OT Goals Patient Stated Goal: to rehab then home per her son OT Goal Formulation: With patient/family Time For Goal Achievement: 05/17/14 Potential to Achieve Goals: Good  OT Frequency: Min 3X/week           Co-evaluation PT/OT/SLP Co-Evaluation/Treatment: Yes Reason for Co-Treatment: For patient/therapist safety   OT goals addressed during session: Strengthening/ROM      End of Session Nurse Communication: Mobility status  Activity Tolerance: Patient  tolerated treatment well Patient left: in chair   Time: [id number] OT Time Calculation (min): 23 min Charges:  OT General Charges $OT Visit: 1 Procedure OT Evaluation $Initial OT Evaluation Tier I: 1 Procedure  Evette GeorgesLeonard, [name] [name] 05/10/2014, 1:17 PM

## 2014-05-10 NOTE — Consult Note (Signed)
Physical Medicine and Rehabilitation Consult Reason for Consult: Abdominal left brain MCA branch infarct status post IV TPA embolic secondary to known atrial fibrillation Referring Physician: Dr. Pearlean Brownie   HPI: [name] is a 81 y.o. right hand female with history of hyperlipidemia, chronic renal insufficiency baseline creatinine 1.36, Coronary artery disease with angioplasty, atrial fibrillation with pacemaker maintained on aspirin. She had been on Coumadin in the past but discontinued due to GI bleed. Patient is a resident [facility]. Patient independent prior to admission. Presented 05/08/2014 with altered mental status slurred speech and right facial droop with right-sided weakness. Initial cranial CT scan showed no acute intracranial abnormality. She did receive TPA. Patient with increased right side hemiparesis as well as expressive aphasia. Repeat CT of the head showed multiple new parenchymal and subarachnoid bleeds since prior study. Echocardiogram with ejection fraction of 55% no wall motion abnormalities without embolism. Maintain on a dysphagia 2 thin liquid diet. Neurology consulted with workup ongoing. Request  made for physical medicine rehabilitation consult. Patient does not remember me from prior meetings. Review of Systems  Cardiovascular: Positive for palpitations.  Gastrointestinal: Positive for constipation.  Musculoskeletal: Positive for myalgias.  All other systems reviewed and are negative.  Past Medical History  Diagnosis Date  . Pure hypercholesterolemia   . Hyperlipidemia     Tolerating medication well as of 11/27/12  . Hypothyroidism   . Carpal tunnel syndrome   . Benign hypertensive heart disease without heart failure   . Coronary atherosclerosis of native coronary artery   . Spinal stenosis of lumbar region   . Osteopenia   . Postsurgical percutaneous transluminal coronary angioplasty status   . Essential hypertension,  benign   . Benign hypertensive kidney disease with chronic kidney disease stage I through stage IV, or unspecified   . Diabetes mellitus with chronic kidney disease   . NSTEMI (non-ST elevated myocardial infarction) 09/21/09    BM stent RCA  . Atrial fibrillation   . Tachy-brady syndrome     s/p PPM, battery change 08/01/09  . Sinoatrial node dysfunction   . Chronic anemia   . Osteoarthritis   . H/O echocardiogram 07/19/09    EF = 60-65%  . Hyperglycemia     Mild  . Microalbuminuria 02/2010  . Vitamin D deficiency 10/2011  . Chronic renal disease, stage III    Past Surgical History  Procedure Laterality Date  . Carpal tunnel release Right 02/07/09  . Vesicovaginal fistula closure w/ tah    . Oophorectomy      With hysterectomy. One ovary removed-fibroids.  . Pacemaker insertion  2004    Gen change 08/04/09 by Dr. Amil Amen. New pacing generator is a Medtronic EnRhythm, model D1939726, serial [id number]  . Pacemaker generator change  08/04/09    By Dr. Amil Amen. New pacing generator is a Medtronic EnRhythm, model D1939726, serial [id number]  . Percutaneous coronary stent intervention (pci-s)  05/08/02    (3 tandem Cypher stent), proximal and mid LAD  . Coronary angioplasty with stent placement  09/21/09    By Dr. Amil Amen. ACS, NSTEMI - BM stent RCA   Family History  Problem Relation Age of Onset  . Heart disease Mother   . Heart disease Father   . Diabetes Father   . Peripheral vascular disease Brother   . Cerebral palsy Son    Social History:  reports that she has never smoked. She does not have any smokeless tobacco history on file. She reports  that she drinks alcohol. She reports that she does not use illicit drugs. Allergies:  Allergies  Allergen Reactions  . Penicillins   . Sulfa Antibiotics    Medications Prior to Admission  Medication Sig Dispense Refill  . aspirin 325 MG tablet Take 325 mg by mouth daily.    Marland Kitchen CARTIA XT 120 MG 24 hr capsule Take 120 mg by mouth.   4  .  donepezil (ARICEPT) 5 MG tablet Take 5 mg by mouth at bedtime.  3  . levothyroxine (SYNTHROID) 50 MCG tablet Take 25 mcg by mouth daily before breakfast.    . MULTAQ 400 MG tablet TAKE 1 TABLET BY MOUTH TWICE DAILY 60 tablet 10  . nitroGLYCERIN (NITROSTAT) 0.4 MG SL tablet Place 0.4 mg under the tongue every 5 (five) minutes as needed for chest pain (MAX 3 TABLETS).     . rosuvastatin (CRESTOR) 5 MG tablet Take 5 mg by mouth daily.    Marland Kitchen ZETIA 10 MG tablet TAKE 1 TABLET BY MOUTH ONCE DAILY 30 tablet 9    Home: Home Living Family/patient expects to be discharged to:: Assisted living  Functional History:   Functional Status:  Mobility:          ADL:    Cognition: Cognition Overall Cognitive Status: No family/caregiver present to determine baseline cognitive functioning Arousal/Alertness: Awake/alert Orientation Level: Oriented to person, Disoriented to situation, Oriented to place, Disoriented to time Attention: Sustained Sustained Attention: Appears intact Memory:  (difficult to assess given severity of aphasia) Awareness: Impaired Awareness Impairment: Intellectual impairment (50% aware of deficits) Problem Solving: Appears intact (for basic problem solving during familiar tasks) Safety/Judgment:  (TBD) Cognition Overall Cognitive Status: No family/caregiver present to determine baseline cognitive functioning  Blood pressure 100/51, pulse 59, temperature 98.4 F (36.9 C), temperature source Oral, resp. rate 20, height 5' (1.524 m), weight 56.6 kg (124 lb 12.5 oz), SpO2 98 %. Physical Exam  Vitals reviewed. HENT:  Right facial droop  Eyes: EOM are normal.  Neck: Normal range of motion. Neck supple. No thyromegaly present.  Cardiovascular:  Cardiac rate controlled  Respiratory: Effort normal and breath sounds normal. No respiratory distress.  GI: Soft. Bowel sounds are normal. She exhibits no distension.  Neurological: She is alert.  Makes good eye contact with  examiner. She does exhibit some expressive and receptive aphasia. She was able answer simple questions and follow simple commands  Skin: Skin is warm and dry.  Motor strength is 0/5 in the right upper extremity 4 minus in the right hip flexor and extensor ankle dorsiflexor and plantar flexor 5 minus/5 in the left deltoid, biceps, triceps, grip, hip flexor, knee extensor, ankle dorsi flexors and plantar flexion Sensation difficult to fully assess secondary to aphasia however she is able to sense pain which in the right upper and right lower limb. Difficulty following two-step commands, difficulty with left right discrimination as well as body part identification Unable to name simple objects  Results for orders placed or performed during the hospital encounter of 05/08/14 (from the past 24 hour(s))  Glucose, capillary     Status: Abnormal   Collection Time: 05/09/14 11:25 AM  Result Value Ref Range   Glucose-Capillary 156 (H) 70 - 99 mg/dL   Comment 1 Notify RN    Comment 2 Document in Chart   Glucose, capillary     Status: Abnormal   Collection Time: 05/09/14  5:49 PM  Result Value Ref Range   Glucose-Capillary 125 (H) 70 - 99 mg/dL  Comment 1 Notify RN    Comment 2 Document in Chart   Glucose, capillary     Status: Abnormal   Collection Time: 05/09/14  8:24 PM  Result Value Ref Range   Glucose-Capillary 219 (H) 70 - 99 mg/dL  Glucose, capillary     Status: Abnormal   Collection Time: 05/09/14 11:05 PM  Result Value Ref Range   Glucose-Capillary 162 (H) 70 - 99 mg/dL  CBC     Status: Abnormal   Collection Time: 05/10/14  3:07 AM  Result Value Ref Range   WBC 11.5 (H) 4.0 - 10.5 K/uL   RBC 4.51 3.87 - 5.11 MIL/uL   Hemoglobin 13.7 12.0 - 15.0 g/dL   HCT 16.1 09.6 - 04.5 %   MCV 92.5 78.0 - 100.0 fL   MCH 30.4 26.0 - 34.0 pg   MCHC 32.9 30.0 - 36.0 g/dL   RDW 40.9 81.1 - 91.4 %   Platelets 208 150 - 400 K/uL  Basic metabolic panel     Status: Abnormal   Collection Time:  05/10/14  3:07 AM  Result Value Ref Range   Sodium 136 135 - 145 mmol/L   Potassium 4.0 3.5 - 5.1 mmol/L   Chloride 106 96 - 112 mmol/L   CO2 23 19 - 32 mmol/L   Glucose, Bld 141 (H) 70 - 99 mg/dL   BUN 13 6 - 23 mg/dL   Creatinine, Ser 7.82 (H) 0.50 - 1.10 mg/dL   Calcium 8.6 8.4 - 95.6 mg/dL   GFR calc non Af Amer 39 (L) >90 mL/min   GFR calc Af Amer 45 (L) >90 mL/min   Anion gap 7 5 - 15  Glucose, capillary     Status: Abnormal   Collection Time: 05/10/14  3:20 AM  Result Value Ref Range   Glucose-Capillary 153 (H) 70 - 99 mg/dL  Glucose, capillary     Status: Abnormal   Collection Time: 05/10/14  7:41 AM  Result Value Ref Range   Glucose-Capillary 127 (H) 70 - 99 mg/dL   Comment 1 Notify RN    Comment 2 Document in Chart    Ct Angio Head W/cm &/or Wo Cm  05/10/2014   ADDENDUM REPORT: 05/10/2014 09:53  ADDENDUM: Study discussed by telephone with NP SHARON BIBY on 05/10/2014 at 0933 hours.   Electronically Signed   By: Odessa Fleming M.D.   On: 05/10/2014 09:53   05/10/2014   CLINICAL DATA:  81 year old female code stroke treated with IV tPA. Intracranial hemorrhage. Initial encounter.  EXAM: CT ANGIOGRAPHY HEAD AND NECK  TECHNIQUE: Multidetector CT imaging of the head and neck was performed using the standard protocol during bolus administration of intravenous contrast. Multiplanar CT image reconstructions and MIPs were obtained to evaluate the vascular anatomy. Carotid stenosis measurements (when applicable) are obtained utilizing NASCET criteria, using the distal internal carotid diameter as the denominator.  CONTRAST:  50mL OMNIPAQUE IOHEXOL 350 MG/ML SOLN  COMPARISON:  Head CTs without contrast 05/09/2014 and earlier. CTA head and neck 04/09/2006.  FINDINGS: CT HEAD  Brain: Anterior superior left frontal lobe intra-axial round hemorrhage has not significantly changed measuring 22-24 mm diameter. Estimated hemorrhage volume 7 mL. Surrounding trace subarachnoid hemorrhage. Small volume  subarachnoid hemorrhage in the central sulcus is stable. Trace subarachnoid hemorrhage in the right parietal lobe is stable. No intraventricular hemorrhage or ventriculomegaly.  Left Motor strip and peri-Rolandic cortex and subcortical white matter hypodensity has developed since 05/08/2014 compatible with cytotoxic edema. No significant mass effect.  Stable gray-white matter differentiation elsewhere with chronic confluent white matter hypodensity. No midline shift or significant intracranial hemorrhage.  Calvarium and skull base: Intact.  Paranasal sinuses: Stable minor paranasal sinus mucosal thickening.  Orbits: Stable and negative orbit and scalp soft tissues.  CTA NECK  Skeleton: Advanced degenerative changes in the cervical spine. Osteopenia. No acute osseous abnormality identified.  Other neck: Partially visible left chest cardiac pacemaker and leads. Mild dependent patchy opacity in the right upper lobe on series 7, image 12. No superior mediastinal lymphadenopathy.  Stable thyroid. Larynx, pharynx, parapharyngeal spaces, retropharyngeal space, sublingual space, submandibular glands and parotid glands are within normal limits. No cervical lymphadenopathy.  Aortic arch: 3 vessel arch configuration. Moderate arch atherosclerosis has not significantly changed since 2008. No great vessel origin stenosis.  Right carotid system: Progressed soft and calcified plaque at the right carotid bifurcation, now all with high-grade stenosis, radiographic string sign at the right ICA origin and proximal bulb (series 11, image 95).  Despite this, the right ICA is patent and otherwise negative to the skullbase.  Left carotid system: Normal left CCA origin. Mild plaque proximal to the left carotid bifurcation. Chronic bulky calcified plaque at the left ICA origin and bulb. Radiographic string sign (series 7, image 68) through the proximal 8 mm of the left ICA, superimposed on tortuosity of the vessel just beyond the bulb with  a high-grade stenosis and kinked appearance (series 9, image 117).  Retropharyngeal course then of the left ICA which more distally is normal to the skullbase.  Vertebral arteries:No proximal subclavian artery stenosis.  Calcified plaque at the right vertebral artery origin but with up to only mild stenosis (coronal image 128 series 9). Distal V1 calcified plaque with up to 50% stenosis. Minimal V2 segment atherosclerosis, and no additional stenosis of the right vertebral artery to the skullbase.  Partially obscured left vertebral artery origin due to paravertebral venous contrast reflux. Contiguous calcified plaque in the proximal left subclavian artery suspected to involve the left vertebral artery origin and up to high-grade stenosis is possible. Calcified plaque in the V1 segment is superimposed with less than 50 percent stenosis. Mild calcified plaque in the V2 segment with no stenosis. No additional hemodynamically significant stenosis to the skullbase.  CTA HEAD  Posterior circulation: Both distal vertebral arteries are patent with calcified plaque on the right and soft and calcified plaque on the left. There is hemodynamically significant stenosis of the distal left vertebral artery which appears diminutive. The left PICA origin remains patent. No significant distal right vertebral artery stenosis. Normal right PICA origin.  Patent vertebrobasilar junction. No basilar artery stenosis. SCA and PCA origins are normal. Normal posterior communicating arteries. Bilateral PCA branches are within normal limits.  Anterior circulation: Bilateral calcified ICA siphon plaque appears to result in less than 50% stenosis bilaterally. Ophthalmic and posterior communicating artery origins are normal. Carotid termini are patent. MCA and ACA origins are patent. Anterior communicating artery and bilateral ACA branches are within normal limits. Right MCA M1 segment and branches are within normal limits.  Left MCA M1 segment is  patent. Left MCA bifurcation is patent without stenosis. Left MCA M2 branches appear stable compared to 2008. No major left MCA branch occlusion is identified. However, there is decreased flow in at posterior branch of the posterior MCA division seen on series 12, image 33.  Venous sinuses: Patent.  Anatomic variants: None.  Delayed phase: No abnormal enhancement identified.  IMPRESSION: 1. Cytotoxic edema along the left peri-Rolandic cortex  has developed compatible with MCA territory infarct. No mass effect. 2. Stable intra-axial and trace subarachnoid hemorrhage as seen on 05/09/2014. Anterior superior left frontal lobe intra-axial blood volume of 7 mL. 3. Progressed bilateral ICA origin stenosis since 2008 now with bilateral ICA RADIOGRAPHIC STRING SIGNS. 4. Patent left MCA M1 segment and bifurcation with no major branch occlusion, but irregular appearing posterior most sylvian division left MCA branch with attenuated enhancement. 5. Bilateral vertebral artery atherosclerosis with up to 50% vertebral artery stenosis on the right. The left vertebral artery origin is not well visualized, and there is distal left vertebral (V4 segment) stenosis with diminutive and irregular appearance of the vessel to the vertebrobasilar junction. The left PICA remains patent. 6. Mild dependent opacity in the right lung could reflect atelectasis, but developing right lung infection would be difficult to exclude.  Electronically Signed: By: Odessa Fleming M.D. On: 05/10/2014 09:28   Ct Head Wo Contrast  05/09/2014   CLINICAL DATA:  Worsening NIHSS score. Evaluate for hemorrhagic transformation of cerebral infarction. History of atrial fibrillation. History of IV tPA administration. Subsequent encounter.  EXAM: CT HEAD WITHOUT CONTRAST  TECHNIQUE: Contiguous axial images were obtained from the base of the skull through the vertex without intravenous contrast.  COMPARISON:  05/08/2014.  FINDINGS: Multiple new parenchymal and subarachnoid  hemorrhages since yesterday's scan. These include  -LEFT frontal cortex, 23 x 25 mm, slight hematocrit level; image 17.  -RIGHT parietal subarachnoid space near the vertex; image 19.  -LEFT posterior frontal cortex and subarachnoid central sulcus, 6 x 7 mm parenchymal, images 21-22.  Areas of nonhemorrhagic cytotoxic edema affect the LEFT precentral and postcentral gyri as seen on images 19 and 20. None of these areas were visible initially on the scan of 05/08/2014.  Stable mild atrophy.  Stable chronic microvascular ischemic change.  Moderate vascular calcification. Moderate hyperostosis. BILATERAL cataract extraction. No sinus or mastoid disease.  IMPRESSION: Multiple new parenchymal and subarachnoid bleeds since yesterday scan. See discussion above.  Critical Value/emergent results were called by telephone at the time of interpretation on 05/09/2014 at 9:46 am to Dr. Annie Main, who verbally acknowledged these results.   Electronically Signed   By: Davonna Belling M.D.   On: 05/09/2014 09:47   Ct Head Wo Contrast  05/08/2014   CLINICAL DATA:  Right-sided weakness. 81 year old female. Code stroke. Slurred speech, confusion, and right-sided weakness began low 1 hr ago.  EXAM: CT HEAD WITHOUT CONTRAST  TECHNIQUE: Contiguous axial images were obtained from the base of the skull through the vertex without intravenous contrast.  COMPARISON:  CT head without contrast 10/18/2011.  FINDINGS: Mild atrophy and white matter disease is stable. Wall no acute cortical infarct, hemorrhage, or mass lesion is present. The basal ganglia are intact. The insular ribbon is intact.  Ventricles are proportionate to the degree of atrophy. No significant extraaxial fluid collection is present.  Will the paranasal sinuses and mastoid air cells are clear. Atherosclerotic calcifications are present at the dural margin of vertebral arteries as well as the cavernous internal carotid arteries bilaterally.  ASPECTS score = 10/10  [place] Stroke  Program Early CT Score  Normal score = 10  IMPRESSION: 1. Stable atrophy and white matter disease. 2. No acute intracranial abnormality. 3. Atherosclerosis. These results were called by telephone at the time of interpretation on 05/08/2014 at 5:00 pm to Dr. Ritta Slot , who verbally acknowledged these results.   Electronically Signed   By: Marin Roberts M.D.   On: 05/08/2014  17:00   Ct Angio Neck W/cm &/or Wo/cm  05/10/2014   ADDENDUM REPORT: 05/10/2014 09:53  ADDENDUM: Study discussed by telephone with NP SHARON BIBY on 05/10/2014 at 0933 hours.   Electronically Signed   By: Odessa Fleming M.D.   On: 05/10/2014 09:53   05/10/2014   CLINICAL DATA:  81 year old female code stroke treated with IV tPA. Intracranial hemorrhage. Initial encounter.  EXAM: CT ANGIOGRAPHY HEAD AND NECK  TECHNIQUE: Multidetector CT imaging of the head and neck was performed using the standard protocol during bolus administration of intravenous contrast. Multiplanar CT image reconstructions and MIPs were obtained to evaluate the vascular anatomy. Carotid stenosis measurements (when applicable) are obtained utilizing NASCET criteria, using the distal internal carotid diameter as the denominator.  CONTRAST:  50mL OMNIPAQUE IOHEXOL 350 MG/ML SOLN  COMPARISON:  Head CTs without contrast 05/09/2014 and earlier. CTA head and neck 04/09/2006.  FINDINGS: CT HEAD  Brain: Anterior superior left frontal lobe intra-axial round hemorrhage has not significantly changed measuring 22-24 mm diameter. Estimated hemorrhage volume 7 mL. Surrounding trace subarachnoid hemorrhage. Small volume subarachnoid hemorrhage in the central sulcus is stable. Trace subarachnoid hemorrhage in the right parietal lobe is stable. No intraventricular hemorrhage or ventriculomegaly.  Left Motor strip and peri-Rolandic cortex and subcortical white matter hypodensity has developed since 05/08/2014 compatible with cytotoxic edema. No significant mass effect.  Stable  gray-white matter differentiation elsewhere with chronic confluent white matter hypodensity. No midline shift or significant intracranial hemorrhage.  Calvarium and skull base: Intact.  Paranasal sinuses: Stable minor paranasal sinus mucosal thickening.  Orbits: Stable and negative orbit and scalp soft tissues.  CTA NECK  Skeleton: Advanced degenerative changes in the cervical spine. Osteopenia. No acute osseous abnormality identified.  Other neck: Partially visible left chest cardiac pacemaker and leads. Mild dependent patchy opacity in the right upper lobe on series 7, image 12. No superior mediastinal lymphadenopathy.  Stable thyroid. Larynx, pharynx, parapharyngeal spaces, retropharyngeal space, sublingual space, submandibular glands and parotid glands are within normal limits. No cervical lymphadenopathy.  Aortic arch: 3 vessel arch configuration. Moderate arch atherosclerosis has not significantly changed since 2008. No great vessel origin stenosis.  Right carotid system: Progressed soft and calcified plaque at the right carotid bifurcation, now all with high-grade stenosis, radiographic string sign at the right ICA origin and proximal bulb (series 11, image 95).  Despite this, the right ICA is patent and otherwise negative to the skullbase.  Left carotid system: Normal left CCA origin. Mild plaque proximal to the left carotid bifurcation. Chronic bulky calcified plaque at the left ICA origin and bulb. Radiographic string sign (series 7, image 68) through the proximal 8 mm of the left ICA, superimposed on tortuosity of the vessel just beyond the bulb with a high-grade stenosis and kinked appearance (series 9, image 117).  Retropharyngeal course then of the left ICA which more distally is normal to the skullbase.  Vertebral arteries:No proximal subclavian artery stenosis.  Calcified plaque at the right vertebral artery origin but with up to only mild stenosis (coronal image 128 series 9). Distal V1 calcified  plaque with up to 50% stenosis. Minimal V2 segment atherosclerosis, and no additional stenosis of the right vertebral artery to the skullbase.  Partially obscured left vertebral artery origin due to paravertebral venous contrast reflux. Contiguous calcified plaque in the proximal left subclavian artery suspected to involve the left vertebral artery origin and up to high-grade stenosis is possible. Calcified plaque in the V1 segment is superimposed with less than 50  percent stenosis. Mild calcified plaque in the V2 segment with no stenosis. No additional hemodynamically significant stenosis to the skullbase.  CTA HEAD  Posterior circulation: Both distal vertebral arteries are patent with calcified plaque on the right and soft and calcified plaque on the left. There is hemodynamically significant stenosis of the distal left vertebral artery which appears diminutive. The left PICA origin remains patent. No significant distal right vertebral artery stenosis. Normal right PICA origin.  Patent vertebrobasilar junction. No basilar artery stenosis. SCA and PCA origins are normal. Normal posterior communicating arteries. Bilateral PCA branches are within normal limits.  Anterior circulation: Bilateral calcified ICA siphon plaque appears to result in less than 50% stenosis bilaterally. Ophthalmic and posterior communicating artery origins are normal. Carotid termini are patent. MCA and ACA origins are patent. Anterior communicating artery and bilateral ACA branches are within normal limits. Right MCA M1 segment and branches are within normal limits.  Left MCA M1 segment is patent. Left MCA bifurcation is patent without stenosis. Left MCA M2 branches appear stable compared to 2008. No major left MCA branch occlusion is identified. However, there is decreased flow in at posterior branch of the posterior MCA division seen on series 12, image 33.  Venous sinuses: Patent.  Anatomic variants: None.  Delayed phase: No abnormal  enhancement identified.  IMPRESSION: 1. Cytotoxic edema along the left peri-Rolandic cortex has developed compatible with MCA territory infarct. No mass effect. 2. Stable intra-axial and trace subarachnoid hemorrhage as seen on 05/09/2014. Anterior superior left frontal lobe intra-axial blood volume of 7 mL. 3. Progressed bilateral ICA origin stenosis since 2008 now with bilateral ICA RADIOGRAPHIC STRING SIGNS. 4. Patent left MCA M1 segment and bifurcation with no major branch occlusion, but irregular appearing posterior most sylvian division left MCA branch with attenuated enhancement. 5. Bilateral vertebral artery atherosclerosis with up to 50% vertebral artery stenosis on the right. The left vertebral artery origin is not well visualized, and there is distal left vertebral (V4 segment) stenosis with diminutive and irregular appearance of the vessel to the vertebrobasilar junction. The left PICA remains patent. 6. Mild dependent opacity in the right lung could reflect atelectasis, but developing right lung infection would be difficult to exclude.  Electronically Signed: By: Odessa Fleming M.D. On: 05/10/2014 09:28    Assessment/Plan: Diagnosis: Left hemiparesis and aphasia 1. Does the need for close, 24 hr/day medical supervision in concert with the patient's rehab needs make it unreasonable for this patient to be served in a less intensive setting? Yes 2. Co-Morbidities requiring supervision/potential complications: Atrial fibrillation,, chronic renal insufficiency,Diabetes mellitus, tachybradycardia syndrome status post pacemaker placement 3. Due to bladder management, bowel management, safety, skin/wound care, disease management, medication administration and patient education, does the patient require 24 hr/day rehab nursing? Yes 4. Does the patient require coordinated care of a physician, rehab nurse, PT (1-2 hrs/day, 5 days/week), OT (11-2 hrs/day, 5 days/week) and SLP (0.5-1 hrs/day, 5 days/week) to  address physical and functional deficits in the context of the above medical diagnosis(es)? Yes Addressing deficits in the following areas: balance, endurance, locomotion, strength, transferring, bowel/bladder control, bathing, dressing, feeding, grooming, toileting, cognition, speech, language, swallowing and psychosocial support 5. Can the patient actively participate in an intensive therapy program of at least 3 hrs of therapy per day at least 5 days per week? Yes 6. The potential for patient to make measurable gains while on inpatient rehab is excellent 7. Anticipated functional outcomes upon discharge from inpatient rehab are supervision  with PT, supervision  and min assist with OT, supervision and min assist with SLP. 8. Estimated rehab length of stay to reach the above functional goals is: 16-20 days 9. Does the patient have adequate social supports and living environment to accommodate these discharge functional goals? Yes 10. Anticipated D/C setting: Home 11. Anticipated post D/C treatments: HH therapy 12. Overall Rehab/Functional Prognosis: excellent  RECOMMENDATIONS: This patient's condition is appropriate for continued rehabilitative care in the following setting: CIR Patient has agreed to participate in recommended program. Potentially Note that insurance prior authorization may be required for reimbursement for recommended care.  Comment: Still in neuro ICU, has cytotoxic edema requiring monitoring as well as post-TPA hemorrhage    05/10/2014

## 2014-05-10 NOTE — Progress Notes (Signed)
STROKE TEAM PROGRESS NOTE   HISTORY [name] is an 81 y.o. female who resides in [place]. She has a history of Afib on ASA. She was noted to be doing well until 1610 05/08/2014 (LKW) when she suddenly became confused, had slurred speech, right facial droop and right sided weakness. EMS was called and patient was brought to [facility] as a code stroke. On arrival she was alert and oriented but continued to show slurred speech, right facial droop and right sided weakness. She was within the window for tPA with no contra indications. Decision was made to give tPA. She does have a history of GI bleed which is why she was taken off of coumadin. She denies any bleeding within the past month.  Modified Rankin: Rankin Score=0. Patient was administered TPA. She was admitted to the neuro ICU for further evaluation and treatment.   SUBJECTIVE (INTERVAL HISTORY): Son and daughter-in law at bedside. She isdoing better speech is better, moving right arm better.     OBJECTIVE Temp:  [97 F (36.1 C)-99.2 F (37.3 C)] 98.4 F (36.9 C) (04/22 0742) Pulse Rate:  [58-90] 62 (04/22 0710) Cardiac Rhythm:  [-] Atrial fibrillation;A-V Sequential paced (04/21 2000) Resp:  [10-30] 15 (04/22 0710) BP: (101-167)/(39-117) 135/62 mmHg (04/22 0710) SpO2:  [95 %-100 %] 98 % (04/22 0710)   Recent Labs Lab 05/09/14 1125 05/09/14 1749 05/09/14 2024 05/09/14 2305 05/10/14 0320  GLUCAP 156* 125* 219* 162* 153*    Recent Labs Lab 05/08/14 1652 05/08/14 1653 05/08/14 1714 05/09/14 0333 05/10/14 0307  NA 139 139 138 139 136  K 4.9 4.8 4.9 4.3 4.0  CL 103 104 105 104 106  CO2 23  --   --  25 23  GLUCOSE 129* 131* 129* 174* 141*  BUN 27* 31* 35* 18 13  CREATININE 1.61* 1.70* 1.60* 1.36* 1.17*  CALCIUM 9.4  --   --  8.9 8.6    Recent Labs Lab 05/08/14 1652  AST 38*  ALT 29  ALKPHOS 63  BILITOT 0.6  PROT 7.1  ALBUMIN 3.6    Recent Labs Lab 05/08/14 1652 05/08/14 1653 05/08/14 1714  05/09/14 0333 05/10/14 0307  WBC 11.6*  --   --  12.2* 11.5*  NEUTROABS 7.6  --   --   --   --   HGB 14.9 15.6* 16.0* 14.2 13.7  HCT 44.1 46.0 47.0* 42.6 41.7  MCV 91.5  --   --  91.2 92.5  PLT 221  --   --  221 208   No results for input(s): CKTOTAL, CKMB, CKMBINDEX, TROPONINI in the last 168 hours.  Recent Labs  05/08/14 2100  LABPROT 14.7  INR 1.13    Recent Labs  05/08/14 1725  COLORURINE YELLOW  LABSPEC 1.008  PHURINE 7.5  GLUCOSEU NEGATIVE  HGBUR SMALL*  BILIRUBINUR NEGATIVE  KETONESUR NEGATIVE  PROTEINUR NEGATIVE  UROBILINOGEN 0.2  NITRITE NEGATIVE  LEUKOCYTESUR MODERATE*       Component Value Date/Time   CHOL 137 05/09/2014 0333   TRIG 79 05/09/2014 0333   HDL 47 05/09/2014 0333   CHOLHDL 2.9 05/09/2014 0333   VLDL 16 05/09/2014 0333   LDLCALC 74 05/09/2014 0333   Lab Results  Component Value Date   HGBA1C * 09/22/2009    6.4 (NOTE)  According to the ADA Clinical Practice Recommendations for 2011, when HbA1c is used as a screening test:   >=6.5%   Diagnostic of Diabetes Mellitus           (if abnormal result  is confirmed)  5.7-6.4%   Increased risk of developing Diabetes Mellitus  References:Diagnosis and Classification of Diabetes Mellitus,Diabetes Care,2011,34(Suppl 1):S62-S69 and Standards of Medical Care in         Diabetes - 2011,Diabetes Care,2011,34  (Suppl 1):S11-S61.      Component Value Date/Time   LABOPIA NONE DETECTED 05/08/2014 1725   COCAINSCRNUR NONE DETECTED 05/08/2014 1725   LABBENZ NONE DETECTED 05/08/2014 1725   AMPHETMU NONE DETECTED 05/08/2014 1725   THCU NONE DETECTED 05/08/2014 1725   LABBARB NONE DETECTED 05/08/2014 1725     Recent Labs Lab 05/08/14 1659  ETH <5    Ct Head Wo Contrast 05/09/2014    Multiple new parenchymal and subarachnoid bleeds since yesterday scan.     Ct Head Wo Contrast 05/08/2014    1. Stable atrophy and white matter  disease.  2. No acute intracranial abnormality.  3. Atherosclerosis.   CT angiogram of the head and neck 05/10/2014 1. Cytotoxic edema along the left peri-Rolandic cortex has developed compatible with MCA territory infarct. No mass effect. 2. Stable intra-axial and trace subarachnoid hemorrhage as seen on 05/09/2014. Anterior superior left frontal lobe intra-axial blood volume of 7 mL. 3. Progressed bilateral ICA origin stenosis since 2008 now with bilateral ICA RADIOGRAPHIC STRING SIGNS. 4. Patent left MCA M1 segment and bifurcation with no major branch occlusion, but irregular appearing posterior most sylvian division left MCA branch with attenuated enhancement. 5. Bilateral vertebral artery atherosclerosis with up to 50% vertebral artery stenosis on the right. The left vertebral artery origin is not well visualized, and there is distal left vertebral (V4 segment) stenosis with diminutive and irregular appearance of the vessel to the vertebrobasilar junction. The left PICA remains patent. 6. Mild dependent opacity in the right lung could reflect atelectasis, but developing right lung infection would be difficult to exclude.      PHYSICAL EXAM Pleasant frail elderly caucasian lady not in distress. . Afebrile. Head is nontraumatic. Neck is supple without bruit.    Cardiac exam no murmur or gallop but irregular heart sounds. Lungs are clear to auscultation. Distal pulses are well felt. Neurological Exam : Awake alert moderate dysarthria and mild expressive aphasia with difficulty in repetition and naming. Good comprehension. Follows commands well. Pupils irregular but reactive. Vision acuity seems adequate. Blinks to threat bilaterally. Moderate right lower facial weakness. Tongue midline. Motor system exam right upper extremity 2/5 proximally at the shoulder only and 0/5 at the elbow hand and wrist. Right lower extremity no drift but minimum 4+/5 weakness of the hip flexors and ankle  dorsiflexors. Normal strength on the left. Mild right hemi-sensory loss. Coordination normal in the lower extremity is and cannot be tested in the right upper extremity. Right plantar is local left downgoing.  NIH stroke scale 8    ASSESSMENT/PLAN [name] is a 81 y.o. female with history of atrial fibrillation on aspirin presenting with confusion, slurred speech, right facial droop and right hemiparesis. She did receive IV t-PA 05/08/2014 at 1708.   Stroke:  Dominant left brain MCA branch infarcts s/p IV tPA, embolic secondary to known atrial fibrillation   Resultant  R hemiparesis, dysarthria and mild expressive aphasia  Overnight weaker R arm with increased expressive aphasia. Stat CT shows left frontal and  superficial hemorrhage post tPA that likely occurred during the night. No indication for anticoagulation reversal protocol.   MRI  / MRA  Pacer  Will cancel Post tPA CT this afternoon in lieu of a CTA in am after hydrated in Creatine down  Carotid Doppler  pending   2D Echo - EF 50-55%. No cardiac source of embolism identified.  SCDs for VTE prophylaxis DIET DYS 2 Room service appropriate?: Yes; Fluid consistency:: Thin; Fluid restriction:: Other (see comments)  aspirin 325 mg orally every day prior to admission, now on no antithrombotics as within 24h of tPA. Will not receive antithrombotic by end of day 2 d/t post tPA hemorrhage  Ongoing aggressive stroke risk factor management  Therapy recommendations: CIR planned.  Disposition:  Anticipate CIR vs SNF at [facility]  Atrial Fibrillation  Not on anticoagulation PTA secondary to hx GIB  May consider NOAC once hemorrhage resolves   Hypertension  Stable this am.   As high as 203/71 during the night. Received 1 dose of labetalol Given hemorrhage, will lower SBP goal to 120-140 over the next 24h  Hyperlipidemia  Home meds:  Zetia 10 mg & Crestor 5mg , resumed in hospital  LDL  74, goal < 70  Continue statin at discharge  Diabetes, type II  HgbA1c - 7.5 , goal < 7.0  Other Stroke Risk Factors  Advanced age  ETOH use  Coronary artery disease - NSTEMI, PTCA, stent 2011  Other Active Problems  Tachy brady syndrome s/p pacer  CKD stage 3. Cr 1.36. Will continue hydration. Check labs in am. BUN 13 creatinine 1.17 today. Improving.  PLAN  Tomorrow initiate aspirin 81 mg daily secondary to severe carotid stenosis.  Plan left carotid endarterectomy following inpatient rehabilitation stay.  Tomorrow will order new parameters for systolic blood pressure maintenance.  Personally examined patient and images, and have documentes history, physical, neuro exam,assessment and plan as stated above.   Naomie DeanAntonia Ahern, MD Stroke Neurology [phone] [facility]   A total of 35 minutes was spent face-to-face with this patient. Over half this time was spent on counseling patient on the stroke diagnosis and different diagnostic and therapeutic options available.          To contact Stroke Continuity provider, please refer to [email or web address]. After hours, contact General Neurology

## 2014-05-10 NOTE — Evaluation (Signed)
Physical Therapy Evaluation Patient Details Name: [name] MRN: [id number] DOB: 1918-03-07 Today's Date: 05/10/2014   History of Present Illness  pt presents with L MCA embolic infarcts post tpa.  After tpa worsening neurologic symptoms noted and pt found to have L Frotnal and Superficial Hemorrhage.  pt with PMH including NSTEMI, pacemaker, and DM.    Clinical Impression  Pt needs A for all aspects of mobility and has a difficult time orienting balance to midline.  Pt with cognitive deficits and noted in previous notes pt having some memory trouble at baseline, will need to f/u with family to determine if current cognition is baseline.  Feel pt would benefit from CIR at D/C to maximize independence and decrease burden of care.  Will continue to follow.      Follow Up Recommendations CIR    Equipment Recommendations  None recommended by PT    Recommendations for Other Services Rehab consult     Precautions / Restrictions Precautions Precautions: Fall Restrictions Weight Bearing Restrictions: No      Mobility  Bed Mobility Overal bed mobility: Needs Assistance Bed Mobility: Supine to Sit     Supine to sit: Mod assist     General bed mobility comments: pt does attempt to A with mobility, but neglectful of R UE and needs cues to attend to UE's position.    Transfers Overall transfer level: Needs assistance Equipment used: 2 person hand held assist Transfers: Sit to/from [employer] Sit to Stand: Mod assist;+2 physical assistance Stand pivot transfers: Mod assist;+2 physical assistance       General transfer comment: pt impulsive and attempting to reach for recliner immediately upon coming to sitting EOB.  pt needs cues for safety, attending to balance and R UE.    Ambulation/Gait                Stairs            Wheelchair Mobility    Modified Rankin (Stroke Patients Only)       Balance Overall balance assessment: Needs  assistance Sitting-balance support: Single extremity supported;Feet supported Sitting balance-Leahy Scale: Zero Sitting balance - Comments: pt leans heavily topt leaning heavily to R side and no awareness of balance deficits.  When attempting to orient pt to midline, pt asks "Why are you pushing me?"  Once in midline pt having difficulty maintaining.   Postural control: Right lateral lean Standing balance support: No upper extremity supported;During functional activity Standing balance-Leahy Scale: Zero Standing balance comment: Decent strength in legs, but not in trunk or RUE                             Pertinent Vitals/Pain Pain Assessment: No/denies pain    Home Living Family/patient expects to be discharged to:: Inpatient rehab                      Prior Function           Comments: Pt reports she was bathing and dressing herself PTA     Hand Dominance   Dominant Hand: Right    Extremity/Trunk Assessment   Upper Extremity Assessment: Defer to OT evaluation RUE Deficits / Details: trace amount of movement for shoulder shrug, shoulder extension, shoulder flexion, and shoulder internal rotation         Lower Extremity Assessment: Generalized weakness;RLE deficits/detail RLE Deficits / Details: pt demos good functional strength, but decreased  coordination.      Cervical / Trunk Assessment: Kyphotic  Communication   Communication: Expressive difficulties;HOH  Cognition Arousal/Alertness: Awake/alert Behavior During Therapy: Impulsive Overall Cognitive Status: No family/caregiver present to determine baseline cognitive functioning Area of Impairment: Orientation;Attention;Memory;Safety/judgement;Awareness;Problem solving Orientation Level: Disoriented to;Place;Time;Situation Current Attention Level: Sustained Memory: Decreased short-term memory   Safety/Judgement: Decreased awareness of safety;Decreased awareness of deficits Awareness:  Intellectual Problem Solving: Slow processing;Difficulty sequencing;Requires verbal cues;Requires tactile cues      General Comments      Exercises        Assessment/Plan    PT Assessment Patient needs continued PT services  PT Diagnosis Difficulty walking;Hemiplegia dominant side   PT Problem List Decreased strength;Decreased activity tolerance;Decreased balance;Decreased mobility;Decreased coordination;Decreased cognition;Decreased knowledge of use of DME;Decreased safety awareness  PT Treatment Interventions DME instruction;Gait training;Functional mobility training;Therapeutic activities;Therapeutic exercise;Balance training;Neuromuscular re-education;Cognitive remediation;Patient/family education   PT Goals (Current goals can be found in the Care Plan section) Acute Rehab PT Goals Patient Stated Goal: to rehab then home per her son PT Goal Formulation: Patient unable to participate in goal setting Time For Goal Achievement: 05/24/14 Potential to Achieve Goals: Good    Frequency Min 4X/week   Barriers to discharge        Co-evaluation PT/OT/SLP Co-Evaluation/Treatment: Yes Reason for Co-Treatment: For patient/therapist safety PT goals addressed during session: Mobility/safety with mobility;Balance OT goals addressed during session: Strengthening/ROM       End of Session   Activity Tolerance: Patient tolerated treatment well Patient left: in chair;with call bell/phone within reach Nurse Communication: Mobility status         Time: [id number] PT Time Calculation (min) (ACUTE ONLY): 27 min   Charges:   PT Evaluation $Initial PT Evaluation Tier I: 1 Procedure     PT G CodesSunny Schlein:        Keyon Liller F, [place]PT [phone] 05/10/2014, 1:34 PM

## 2014-05-10 NOTE — Progress Notes (Signed)
An inpt rehab bed is likely available on Monday pending medical readiness to d/c. I have made her son aware and he is in agreement. I will follow up on Monday. [phone]

## 2014-05-10 NOTE — Progress Notes (Signed)
I met with pt's son Dr. Rita Ohara and his wife this morning per his request. Pt was at [place] at [place]. Did not use AD, not driving. Independent with adls. Does have some mild memory deficits pta. Son is requesting CIR admission prior to returning to [facility]. He is open to change in her level of care at [facility] depending on her functional status after a CIR stay. PT and OT to see pt today. I met with pt at bedside to introduce myself and to introduce a possible inpt rehab admission. Rehab consult pending. I will follow up today after consult completed to further discuss with pt and son. [phone]

## 2014-05-10 NOTE — Progress Notes (Signed)
*  PRELIMINARY RESULTS* Vascular Ultrasound Carotid Duplex (Doppler) has been completed.  Preliminary findings: Bilateral  40-59% internal carotid artery stenosis.     Farrel DemarkJill Eunice, RDMS, RVT  05/10/2014, 12:27 PM

## 2014-05-10 NOTE — Progress Notes (Addendum)
Speech Language Pathology Treatment: Dysphagia;Cognitive-Linquistic  Patient Details Name: [name] MRN: [id number] DOB: [birth year] Today's Date: 05/10/2014 Time: [id number] SLP Time Calculation (min) (ACUTE ONLY): 20 min  Assessment / Plan / Recommendation Clinical Impression  Pt seen for dysphagia and aphasia, dysarthria treat. Pt provided regular texture to determine ability to upgrade. Minimal right sided residue on lip without sensation. Pt educated to check during and after meal using tongue to remove pocketed food. No s/s aspiration during straw sips water. She followed 2 step commands with 60% accuracy. Pt responded in phrase/sentence level appropriately. Decreased expression during increased mean length of utterances and pt frequently stopped and not reattempt saying "oh you know what I mean." Pt educated to speak slowly and increase ROM of lips and tongue. Will continue ST intervention.   HPI HPI: 81 y/o female patient of [facility], admitted with slurred speech, right facial droop and right sided weakness. Diagnosed with left brain MCA branch infarcts s/p IV tPA, embolic secondary to known atrial fibrillation.  Patient with increased hemiparesis, dysarthria, and new onset expressive aphasia overnight 4/20. Stat CT showed left frontal and superficial hemorrhage post tPA. Patient initially passed RN stroke swallow screen on admission but noted to be having difficulty swallowing 4/21.    Pertinent Vitals Pain Assessment: No/denies pain  SLP Plan  Continue with current plan of care    Recommendations Diet recommendations: Dysphagia 2 (fine chop);Thin liquid Liquids provided via: Straw;Cup Medication Administration: Crushed with puree Supervision: Patient able to self feed;Full supervision/cueing for compensatory strategies Compensations: Slow rate;Small sips/bites;Check for pocketing Postural Changes and/or Swallow Maneuvers: Seated upright 90 degrees              Oral Care  Recommendations: Oral care BID Follow up Recommendations: Inpatient Rehab Plan: Continue with current plan of care    GO     [name], [name] 05/10/2014, 1:48 PM  [name] M.Ed [employer]SLP Pager [phone]

## 2014-05-11 LAB — GLUCOSE, CAPILLARY
GLUCOSE-CAPILLARY: 139 mg/dL — AB (ref 70–99)
Glucose-Capillary: 173 mg/dL — ABNORMAL HIGH (ref 70–99)
Glucose-Capillary: 194 mg/dL — ABNORMAL HIGH (ref 70–99)
Glucose-Capillary: 151 mg/dL — ABNORMAL HIGH (ref 70–99)

## 2014-05-11 MED ORDER — ASPIRIN EC 81 MG PO TBEC
81.0000 mg | DELAYED_RELEASE_TABLET | Freq: Every day | ORAL | Status: DC
  Administered 2014-05-11 – 2014-05-13 (×3): 81 mg via ORAL
  Filled 2014-05-11 (×3): qty 1

## 2014-05-11 MED ORDER — CETYLPYRIDINIUM CHLORIDE 0.05 % MT LIQD
7.0000 mL | Freq: Two times a day (BID) | OROMUCOSAL | Status: DC
  Administered 2014-05-12 (×2): 7 mL via OROMUCOSAL

## 2014-05-11 MED ORDER — PANTOPRAZOLE SODIUM 40 MG PO TBEC
40.0000 mg | DELAYED_RELEASE_TABLET | Freq: Every day | ORAL | Status: DC
  Administered 2014-05-11 – 2014-05-13 (×3): 40 mg via ORAL
  Filled 2014-05-11 (×3): qty 1

## 2014-05-11 NOTE — Plan of Care (Signed)
Problem: tPA Day Progression Outcomes-Only if tPA administered Goal: Post tPA image without hemorrhage Outcome: Not Met (add Reason) Pt has hemmorrhage    Goal: Post tPA neurologically at baseline or improved CRITERIA FOR NEUROLOGICALLY AT BASELINE OR IMPROVED: - NO S/S OF INC. ICP - AWAKE, ALERT, ORIENTED X3 - SPEECH CLEAR, APPROPRIATE - PERRL - EOMS, BLINK INTACT - FACE SYMMETRICAL - TONGUE/TRACH MIDLINE - GRIPS, PUSH/PULL EQUAL - NO PRONATOR DRIFT - DORSIPLANTAR FLEXION EQUAL - MOVES ALL EXTREMITIES - SENSATION INTACT - NO NUCHAL RIGIDITY OR PHOTOPHOBIA  Outcome: Not Met (add Reason) Fluctuating

## 2014-05-11 NOTE — Progress Notes (Signed)
STROKE TEAM PROGRESS NOTE   HISTORY [name] is an 81 y.o. female who resides in [place]. She has a history of Afib on ASA. She was noted to be doing well until 1610 05/08/2014 (LKW) when she suddenly became confused, had slurred speech, right facial droop and right sided weakness. EMS was called and patient was brought to [facility] as a code stroke. On arrival she was alert and oriented but continued to show slurred speech, right facial droop and right sided weakness. She was within the window for tPA with no contra indications. Decision was made to give tPA. She does have a history of GI bleed which is why she was taken off of coumadin. She denies any bleeding within the past month.  Modified Rankin: Rankin Score=0. Patient was administered TPA. She was admitted to the neuro ICU for further evaluation and treatment.   SUBJECTIVE (INTERVAL HISTORY): She feels well today, no improvement in left arm strength      OBJECTIVE Temp:  [98.3 F (36.8 C)-99.3 F (37.4 C)] 98.5 F (36.9 C) (04/23 0800) Pulse Rate:  [58-75] 65 (04/23 0700) Cardiac Rhythm:  [-] Atrial fibrillation;A-V Sequential paced (04/22 2000) Resp:  [15-29] 24 (04/23 0700) BP: (96-155)/(34-113) 135/53 mmHg (04/23 0700) SpO2:  [96 %-100 %] 98 % (04/23 0700)   Recent Labs Lab 05/10/14 0741 05/10/14 1151 05/10/14 1745 05/10/14 2147 05/11/14 0825  GLUCAP 127* 145* 196* 151* 139*    Recent Labs Lab 05/08/14 1652 05/08/14 1653 05/08/14 1714 05/09/14 0333 05/10/14 0307  NA 139 139 138 139 136  K 4.9 4.8 4.9 4.3 4.0  CL 103 104 105 104 106  CO2 23  --   --  25 23  GLUCOSE 129* 131* 129* 174* 141*  BUN 27* 31* 35* 18 13  CREATININE 1.61* 1.70* 1.60* 1.36* 1.17*  CALCIUM 9.4  --   --  8.9 8.6    Recent Labs Lab 05/08/14 1652  AST 38*  ALT 29  ALKPHOS 63  BILITOT 0.6  PROT 7.1  ALBUMIN 3.6    Recent Labs Lab 05/08/14 1652 05/08/14 1653 05/08/14 1714 05/09/14 0333 05/10/14 0307  WBC  11.6*  --   --  12.2* 11.5*  NEUTROABS 7.6  --   --   --   --   HGB 14.9 15.6* 16.0* 14.2 13.7  HCT 44.1 46.0 47.0* 42.6 41.7  MCV 91.5  --   --  91.2 92.5  PLT 221  --   --  221 208   No results for input(s): CKTOTAL, CKMB, CKMBINDEX, TROPONINI in the last 168 hours.  Recent Labs  05/08/14 2100  LABPROT 14.7  INR 1.13    Recent Labs  05/08/14 1725  COLORURINE YELLOW  LABSPEC 1.008  PHURINE 7.5  GLUCOSEU NEGATIVE  HGBUR SMALL*  BILIRUBINUR NEGATIVE  KETONESUR NEGATIVE  PROTEINUR NEGATIVE  UROBILINOGEN 0.2  NITRITE NEGATIVE  LEUKOCYTESUR MODERATE*       Component Value Date/Time   CHOL 137 05/09/2014 0333   TRIG 79 05/09/2014 0333   HDL 47 05/09/2014 0333   CHOLHDL 2.9 05/09/2014 0333   VLDL 16 05/09/2014 0333   LDLCALC 74 05/09/2014 0333   Lab Results  Component Value Date   HGBA1C 7.5* 05/09/2014      Component Value Date/Time   LABOPIA NONE DETECTED 05/08/2014 1725   COCAINSCRNUR NONE DETECTED 05/08/2014 1725   LABBENZ NONE DETECTED 05/08/2014 1725   AMPHETMU NONE DETECTED 05/08/2014 1725   THCU NONE DETECTED 05/08/2014 1725  LABBARB NONE DETECTED 05/08/2014 1725     Recent Labs Lab 05/08/14 1659  ETH <5    Ct Head Wo Contrast 05/09/2014    Multiple new parenchymal and subarachnoid bleeds since yesterday scan.     Ct Head Wo Contrast 05/08/2014    1. Stable atrophy and white matter disease.  2. No acute intracranial abnormality.  3. Atherosclerosis.   CT angiogram of the head and neck 05/10/2014 1. Cytotoxic edema along the left peri-Rolandic cortex has developed compatible with MCA territory infarct. No mass effect. 2. Stable intra-axial and trace subarachnoid hemorrhage as seen on 05/09/2014. Anterior superior left frontal lobe intra-axial blood volume of 7 mL. 3. Progressed bilateral ICA origin stenosis since 2008 now with bilateral ICA RADIOGRAPHIC STRING SIGNS. 4. Patent left MCA M1 segment and bifurcation with no major branch  occlusion, but irregular appearing posterior most sylvian division left MCA branch with attenuated enhancement. 5. Bilateral vertebral artery atherosclerosis with up to 50% vertebral artery stenosis on the right. The left vertebral artery origin is not well visualized, and there is distal left vertebral (V4 segment) stenosis with diminutive and irregular appearance of the vessel to the vertebrobasilar junction. The left PICA remains patent. 6. Mild dependent opacity in the right lung could reflect atelectasis, but developing right lung infection would be difficult to exclude.      PHYSICAL EXAM Pleasant frail elderly caucasian lady not in distress. . Afebrile. Head is nontraumatic. Neck is supple without bruit.    Cardiac exam no murmur or gallop but irregular heart sounds. Lungs are clear to auscultation. Distal pulses are well felt. Neurological Exam : Awake alert moderate dysarthria and mild expressive aphasia with difficulty in repetition and naming. Good comprehension. Follows commands well. Pupils irregular but reactive. Vision acuity seems adequate. Blinks to threat bilaterally. Moderate right lower facial weakness. Tongue midline. Motor system exam right upper extremity 2/5 proximally at the shoulder only and 0/5 at the elbow hand and wrist. Right lower extremity no drift but minimum 4+/5 weakness of the hip flexors and ankle dorsiflexors. Normal strength on the left. Mild right hemi-sensory loss. Coordination normal in the lower extremity is and cannot be tested in the right upper extremity. Right plantar is local left downgoing.  NIH stroke scale 8    ASSESSMENT/PLAN [name] is a 81 y.o. female with history of atrial fibrillation on aspirin presenting with confusion, slurred speech, right facial droop and right hemiparesis. She did receive IV t-PA 05/08/2014 at 1708.   Stroke:  Dominant left brain MCA branch infarcts s/p IV tPA, embolic secondary to known atrial fibrillation    Resultant  R hemiparesis, dysarthria and mild expressive aphasia  Overnight weaker R arm with increased expressive aphasia. Stat CT shows left frontal and superficial hemorrhage post tPA that likely occurred during the night. No indication for anticoagulation reversal protocol.   MRI  / MRA  Pacer  Will cancel Post tPA CT this afternoon in lieu of a CTA in am after hydrated in Creatine down  Carotid Doppler  Preliminary findings: Bilateral 40-59% internal carotid artery stenosis.   2D Echo - EF 50-55%. No cardiac source of embolism identified.  SCDs for VTE prophylaxis DIET DYS 2 Room service appropriate?: Yes; Fluid consistency:: Thin; Fluid restriction:: Other (see comments)  aspirin 325 mg orally every day prior to admission, now on no antithrombotics as within 24h of tPA. Will not receive antithrombotic by end of day 2 d/t post tPA hemorrhage  Ongoing aggressive stroke risk factor  management  Therapy recommendations: CIR planned.  Disposition:  Anticipate CIR vs SNF at [facility] (currently AL at [facility])  Atrial Fibrillation  Not on anticoagulation PTA secondary to hx GIB  May consider NOAC once hemorrhage resolves   Hypertension  Stable this am.   As high as 203/71 during the night. Received 1 dose of labetalol    Hyperlipidemia  Home meds:  Zetia 10 mg & Crestor , resumed in hospital  LDL 74, goal < 70  Continue statin at discharge  Diabetes, type II  HgbA1c - 7.5 , goal < 7.0  Other Stroke Risk Factors  Advanced age  ETOH use  Coronary artery disease - NSTEMI, PTCA, stent 2011  Other Active Problems  Tachy brady syndrome s/p pacer  CKD stage 3. Cr 1.36. Will continue hydration. BUN 13 creatinine 1.17 - 05/10/2014 Improving.  PLAN  initiate aspirin 81 mg daily secondary to severe carotid stenosis.Patient is at significant risk for further strokes causing morbidity/mortality given severe stenosis.  Plan left carotid endarterectomy  following inpatient rehabilitation stay.  Order new parameters for systolic blood pressure maintenance.  Personally examined patient and images, and have documentes history, physical, neuro exam,assessment and plan as stated above.   Naomie Dean, MD Stroke Neurology [phone] [facility]   To contact Stroke Continuity provider, please refer to [email or web address]. After hours, contact General Neurology

## 2014-05-12 ENCOUNTER — Inpatient Hospital Stay (HOSPITAL_COMMUNITY): Payer: Medicare Other

## 2014-05-12 LAB — BASIC METABOLIC PANEL
Anion gap: 9 (ref 5–15)
BUN: 12 mg/dL (ref 6–23)
CO2: 23 mmol/L (ref 19–32)
Calcium: 8.7 mg/dL (ref 8.4–10.5)
Chloride: 104 mmol/L (ref 96–112)
Creatinine, Ser: 1.03 mg/dL (ref 0.50–1.10)
GFR calc Af Amer: 53 mL/min — ABNORMAL LOW (ref >90)
GFR, EST NON AFRICAN AMERICAN: 45 mL/min — AB (ref >90)
Glucose, Bld: 165 mg/dL — ABNORMAL HIGH (ref 70–99)
Potassium: 3.7 mmol/L (ref 3.5–5.1)
Sodium: 136 mmol/L (ref 135–145)

## 2014-05-12 LAB — GLUCOSE, CAPILLARY
GLUCOSE-CAPILLARY: 124 mg/dL — AB (ref 70–99)
GLUCOSE-CAPILLARY: 112 mg/dL — AB (ref 70–99)
Glucose-Capillary: 163 mg/dL — ABNORMAL HIGH (ref 70–99)
Glucose-Capillary: 172 mg/dL — ABNORMAL HIGH (ref 70–99)

## 2014-05-12 MED ORDER — INSULIN ASPART 100 UNIT/ML ~~LOC~~ SOLN
0.0000 [IU] | Freq: Three times a day (TID) | SUBCUTANEOUS | Status: DC
  Administered 2014-05-12: 2 [IU] via SUBCUTANEOUS
  Administered 2014-05-12 – 2014-05-13 (×3): 1 [IU] via SUBCUTANEOUS

## 2014-05-12 NOTE — Progress Notes (Signed)
STROKE TEAM PROGRESS NOTE   HISTORY [name] is an 81 y.o. female who resides in [place]. She has a history of Afib on ASA. She was noted to be doing well until 1610 05/08/2014 (LKW) when she suddenly became confused, had slurred speech, right facial droop and right sided weakness. EMS was called and patient was brought to [facility] as a code stroke. On arrival she was alert and oriented but continued to show slurred speech, right facial droop and right sided weakness. She was within the window for tPA with no contra indications. Decision was made to give tPA. She does have a history of GI bleed which is why she was taken off of coumadin. She denies any bleeding within the past month.  Modified Rankin: Rankin Score=0. Patient was administered TPA. She was admitted to the neuro ICU for further evaluation and treatment.   SUBJECTIVE (INTERVAL HISTORY): She feels well today, no improvement in left arm strength      OBJECTIVE Temp:  [97.9 F (36.6 C)-99.2 F (37.3 C)] 99.2 F (37.3 C) (04/24 0732) Pulse Rate:  [48-112] 48 (04/24 0700) Cardiac Rhythm:  [-] Atrial fibrillation (04/23 1922) Resp:  [13-34] 16 (04/24 0700) BP: (108-145)/(44-82) 111/63 mmHg (04/24 0600) SpO2:  [95 %-100 %] 97 % (04/24 0600) Weight:  [56.1 kg (123 lb 10.9 oz)] 56.1 kg (123 lb 10.9 oz) (04/24 0500)   Recent Labs Lab 05/11/14 0825 05/11/14 1213 05/11/14 1815 05/11/14 2130 05/12/14 0734  GLUCAP 139* 173* 194* 151* 163*    Recent Labs Lab 05/08/14 1652 05/08/14 1653 05/08/14 1714 05/09/14 0333 05/10/14 0307 05/12/14 0228  NA 139 139 138 139 136 136  K 4.9 4.8 4.9 4.3 4.0 3.7  CL 103 104 105 104 106 104  CO2 23  --   --  GLUCOSE 129* 131* 129* 174* 141* 165*  BUN 27* 31* 35* CREATININE 1.61* 1.70* 1.60* 1.36* 1.17* 1.03  CALCIUM 9.4  --   --  8.9 8.6 8.7    Recent Labs Lab 05/08/14 1652  AST 38*  ALT 29  ALKPHOS 63  BILITOT 0.6  PROT 7.1  ALBUMIN 3.6     Recent Labs Lab 05/08/14 1652 05/08/14 1653 05/08/14 1714 05/09/14 0333 05/10/14 0307  WBC 11.6*  --   --  12.2* 11.5*  NEUTROABS 7.6  --   --   --   --   HGB 14.9 15.6* 16.0* 14.2 13.7  HCT 44.1 46.0 47.0* 42.6 41.7  MCV 91.5  --   --  91.2 92.5  PLT 221  --   --  221 208   No results for input(s): CKTOTAL, CKMB, CKMBINDEX, TROPONINI in the last 168 hours. No results for input(s): LABPROT, INR in the last 72 hours. No results for input(s): COLORURINE, LABSPEC, PHURINE, GLUCOSEU, HGBUR, BILIRUBINUR, KETONESUR, PROTEINUR, UROBILINOGEN, NITRITE, LEUKOCYTESUR in the last 72 hours.  Invalid input(s): APPERANCEUR     Component Value Date/Time   CHOL 137 05/09/2014 0333   TRIG 79 05/09/2014 0333   HDL 47 05/09/2014 0333   CHOLHDL 2.9 05/09/2014 0333   VLDL 16 05/09/2014 0333   LDLCALC 74 05/09/2014 0333   Lab Results  Component Value Date   HGBA1C 7.5* 05/09/2014      Component Value Date/Time   LABOPIA NONE DETECTED 05/08/2014 1725   COCAINSCRNUR NONE DETECTED 05/08/2014 1725   LABBENZ NONE DETECTED 05/08/2014 1725   AMPHETMU NONE DETECTED 05/08/2014 1725   THCU  NONE DETECTED 05/08/2014 1725   LABBARB NONE DETECTED 05/08/2014 1725     Recent Labs Lab 05/08/14 1659  ETH <5    Ct Head Wo Contrast 05/09/2014    Multiple new parenchymal and subarachnoid bleeds since yesterday scan.     Ct Head Wo Contrast 05/08/2014    1. Stable atrophy and white matter disease.  2. No acute intracranial abnormality.  3. Atherosclerosis.   CT angiogram of the head and neck 05/10/2014 1. Cytotoxic edema along the left peri-Rolandic cortex has developed compatible with MCA territory infarct. No mass effect. 2. Stable intra-axial and trace subarachnoid hemorrhage as seen on 05/09/2014. Anterior superior left frontal lobe intra-axial blood volume of 7 mL. 3. Progressed bilateral ICA origin stenosis since 2008 now with bilateral ICA RADIOGRAPHIC STRING SIGNS. 4. Patent left MCA  M1 segment and bifurcation with no major branch occlusion, but irregular appearing posterior most sylvian division left MCA branch with attenuated enhancement. 5. Bilateral vertebral artery atherosclerosis with up to 50% vertebral artery stenosis on the right. The left vertebral artery origin is not well visualized, and there is distal left vertebral (V4 segment) stenosis with diminutive and irregular appearance of the vessel to the vertebrobasilar junction. The left PICA remains patent. 6. Mild dependent opacity in the right lung could reflect atelectasis, but developing right lung infection would be difficult to exclude.      PHYSICAL EXAM Pleasant frail elderly caucasian lady not in distress. . Afebrile. Head is nontraumatic. Neck is supple without bruit.    Cardiac exam no murmur or gallop but irregular heart sounds. Lungs are clear to auscultation. Distal pulses are well felt. Neurological Exam : Awake alert moderate dysarthria and mild expressive aphasia with difficulty in repetition and naming. Good comprehension. Follows commands well. Pupils irregular but reactive. Vision acuity seems adequate. Blinks to threat bilaterally. Moderate right lower facial weakness. Tongue midline. Motor system exam right upper extremity 2/5 proximally at the shoulder only and 0/5 at the elbow hand and wrist. Right lower extremity no drift but minimum 4+/5 weakness of the hip flexors and ankle dorsiflexors. Normal strength on the left. Mild right hemi-sensory loss. Coordination normal in the lower extremity is and cannot be tested in the right upper extremity. Right plantar is local left downgoing.  NIH stroke scale 8    ASSESSMENT/PLAN [name] is a 81 y.o. female with history of atrial fibrillation on aspirin presenting with confusion, slurred speech, right facial droop and right hemiparesis. She did receive IV t-PA 05/08/2014 at 1708.   Stroke:  Dominant left brain MCA branch infarcts s/p IV tPA,  embolic secondary to known atrial fibrillation   Resultant  R hemiparesis, dysarthria and mild expressive aphasia  Overnight weaker R arm with increased expressive aphasia. Stat CT shows left frontal and superficial hemorrhage post tPA that likely occurred during the night. No indication for anticoagulation reversal protocol.   MRI  / MRA  Pacer  Will cancel Post tPA CT this afternoon in lieu of a CTA in am after hydrated in Creatine down  Carotid Doppler  Preliminary findings: Bilateral 40-59% internal carotid artery stenosis.   2D Echo - EF 50-55%. No cardiac source of embolism identified.  SCDs for VTE prophylaxis DIET DYS 2 Room service appropriate?: Yes; Fluid consistency:: Thin; Fluid restriction:: Other (see comments)  aspirin 325 mg orally every day prior to admission, now on no antithrombotics as within 24h of tPA. Will not receive antithrombotic by end of day 2 d/t post tPA hemorrhage  Now on ASA 81 mg daily started 05/11/2014  Ongoing aggressive stroke risk factor management  Therapy recommendations: CIR planned. Seen by Dr Wynn BankerKirsteins 05/10/2014  Disposition:  Anticipate CIR vs SNF at [facility] ([facility])  Atrial Fibrillation  Not on anticoagulation PTA secondary to hx GIB  May consider NOAC once hemorrhage resolves - now on ASA 81 mg QD   Hypertension  Stable this am.   As high as 203/71 during the night. Received 1 dose of labetalol    Hyperlipidemia  Home meds:  Zetia 10 mg & Crestor 5mg , resumed in hospital  LDL 74, goal < 70  Continue statin at discharge  Diabetes, type II  HgbA1c - 7.5 , goal < 7.0  Other Stroke Risk Factors  Advanced age  ETOH use  Coronary artery disease - NSTEMI, PTCA, stent 2011  Other Active Problems  Tachy brady syndrome s/p pacer  CKD stage 3. - BUN 12 creatinine 1.03 - 05/12/2014 Improving.  Opacity in the right lung noted on CTA - temp 99.2 Sunday - will check CXR and CBC (in  AM)  PLAN  Aspirin 81 mg daily started 05/11/2014 secondary to severe carotid stenosis.Patient is at significant risk for further strokes causing morbidity/mortality given severe stenosis.  Plan left carotid endarterectomy following inpatient rehabilitation stay.  Ordered new parameters for systolic blood pressure maintenance. 120 - 180  Personally examined patient and images, and have documentes history, physical, neuro exam,assessment and plan as stated above.   A total of 25 minutes was spent in with this patient. Over half this time was spent on counseling patient on the diagnosis and different therapeutic options available. Patient has baseline deficits, family not at bedside. Plan has been discussed with family as well.    Naomie DeanAntonia Ahern, MD Stroke Neurology [phone] [facility]   To contact Stroke Continuity provider, please refer to [email or web address]. After hours, contact General Neurology

## 2014-05-13 ENCOUNTER — Inpatient Hospital Stay (HOSPITAL_COMMUNITY)
Admission: RE | Admit: 2014-05-13 | Discharge: 2014-05-27 | DRG: 056 | Disposition: A | Discharge status: Skilled Nursing Facility | Payer: Medicare Other | Source: Intra-hospital | Attending: Physical Medicine & Rehabilitation | Admitting: Physical Medicine & Rehabilitation

## 2014-05-13 ENCOUNTER — Encounter (HOSPITAL_COMMUNITY): Payer: Medicare Other

## 2014-05-13 ENCOUNTER — Encounter: Payer: Self-pay | Admitting: Neurology

## 2014-05-13 DIAGNOSIS — I609 Nontraumatic subarachnoid hemorrhage, unspecified: Secondary | ICD-10-CM | POA: Diagnosis not present

## 2014-05-13 DIAGNOSIS — I481 Persistent atrial fibrillation: Secondary | ICD-10-CM

## 2014-05-13 DIAGNOSIS — I69392 Facial weakness following cerebral infarction: Secondary | ICD-10-CM

## 2014-05-13 DIAGNOSIS — I63512 Cerebral infarction due to unspecified occlusion or stenosis of left middle cerebral artery: Secondary | ICD-10-CM | POA: Diagnosis present

## 2014-05-13 DIAGNOSIS — E039 Hypothyroidism, unspecified: Secondary | ICD-10-CM | POA: Diagnosis present

## 2014-05-13 DIAGNOSIS — I2510 Atherosclerotic heart disease of native coronary artery without angina pectoris: Secondary | ICD-10-CM | POA: Diagnosis present

## 2014-05-13 DIAGNOSIS — I252 Old myocardial infarction: Secondary | ICD-10-CM | POA: Diagnosis not present

## 2014-05-13 DIAGNOSIS — I638 Other cerebral infarction: Secondary | ICD-10-CM

## 2014-05-13 DIAGNOSIS — I618 Other nontraumatic intracerebral hemorrhage: Secondary | ICD-10-CM | POA: Diagnosis present

## 2014-05-13 DIAGNOSIS — K5900 Constipation, unspecified: Secondary | ICD-10-CM | POA: Diagnosis present

## 2014-05-13 DIAGNOSIS — E1122 Type 2 diabetes mellitus with diabetic chronic kidney disease: Secondary | ICD-10-CM | POA: Diagnosis present

## 2014-05-13 DIAGNOSIS — H9190 Unspecified hearing loss, unspecified ear: Secondary | ICD-10-CM | POA: Diagnosis present

## 2014-05-13 DIAGNOSIS — E1140 Type 2 diabetes mellitus with diabetic neuropathy, unspecified: Secondary | ICD-10-CM | POA: Diagnosis not present

## 2014-05-13 DIAGNOSIS — R1310 Dysphagia, unspecified: Secondary | ICD-10-CM | POA: Diagnosis present

## 2014-05-13 DIAGNOSIS — E785 Hyperlipidemia, unspecified: Secondary | ICD-10-CM | POA: Diagnosis present

## 2014-05-13 DIAGNOSIS — G8191 Hemiplegia, unspecified affecting right dominant side: Secondary | ICD-10-CM

## 2014-05-13 DIAGNOSIS — R410 Disorientation, unspecified: Secondary | ICD-10-CM | POA: Diagnosis not present

## 2014-05-13 DIAGNOSIS — E780 Pure hypercholesterolemia: Secondary | ICD-10-CM | POA: Diagnosis present

## 2014-05-13 DIAGNOSIS — N179 Acute kidney failure, unspecified: Secondary | ICD-10-CM | POA: Diagnosis not present

## 2014-05-13 DIAGNOSIS — G8190 Hemiplegia, unspecified affecting unspecified side: Secondary | ICD-10-CM | POA: Diagnosis not present

## 2014-05-13 DIAGNOSIS — I4891 Unspecified atrial fibrillation: Secondary | ICD-10-CM | POA: Diagnosis present

## 2014-05-13 DIAGNOSIS — H9193 Unspecified hearing loss, bilateral: Secondary | ICD-10-CM | POA: Diagnosis present

## 2014-05-13 DIAGNOSIS — F0390 Unspecified dementia without behavioral disturbance: Secondary | ICD-10-CM | POA: Diagnosis present

## 2014-05-13 DIAGNOSIS — N183 Chronic kidney disease, stage 3 (moderate): Secondary | ICD-10-CM | POA: Diagnosis present

## 2014-05-13 DIAGNOSIS — I69391 Dysphagia following cerebral infarction: Secondary | ICD-10-CM | POA: Diagnosis not present

## 2014-05-13 DIAGNOSIS — Z950 Presence of cardiac pacemaker: Secondary | ICD-10-CM

## 2014-05-13 DIAGNOSIS — S0990XA Unspecified injury of head, initial encounter: Secondary | ICD-10-CM | POA: Diagnosis not present

## 2014-05-13 DIAGNOSIS — I6523 Occlusion and stenosis of bilateral carotid arteries: Secondary | ICD-10-CM | POA: Diagnosis present

## 2014-05-13 DIAGNOSIS — R4781 Slurred speech: Secondary | ICD-10-CM

## 2014-05-13 DIAGNOSIS — E1159 Type 2 diabetes mellitus with other circulatory complications: Secondary | ICD-10-CM

## 2014-05-13 DIAGNOSIS — I1310 Hypertensive heart and chronic kidney disease without heart failure, with stage 1 through stage 4 chronic kidney disease, or unspecified chronic kidney disease: Secondary | ICD-10-CM | POA: Diagnosis present

## 2014-05-13 DIAGNOSIS — I629 Nontraumatic intracranial hemorrhage, unspecified: Secondary | ICD-10-CM | POA: Diagnosis not present

## 2014-05-13 DIAGNOSIS — I69351 Hemiplegia and hemiparesis following cerebral infarction affecting right dominant side: Principal | ICD-10-CM

## 2014-05-13 DIAGNOSIS — I10 Essential (primary) hypertension: Secondary | ICD-10-CM

## 2014-05-13 DIAGNOSIS — I69320 Aphasia following cerebral infarction: Secondary | ICD-10-CM

## 2014-05-13 DIAGNOSIS — R0989 Other specified symptoms and signs involving the circulatory and respiratory systems: Secondary | ICD-10-CM

## 2014-05-13 DIAGNOSIS — Z955 Presence of coronary angioplasty implant and graft: Secondary | ICD-10-CM | POA: Diagnosis not present

## 2014-05-13 DIAGNOSIS — E1142 Type 2 diabetes mellitus with diabetic polyneuropathy: Secondary | ICD-10-CM | POA: Diagnosis present

## 2014-05-13 DIAGNOSIS — E559 Vitamin D deficiency, unspecified: Secondary | ICD-10-CM | POA: Diagnosis present

## 2014-05-13 DIAGNOSIS — I6522 Occlusion and stenosis of left carotid artery: Secondary | ICD-10-CM

## 2014-05-13 DIAGNOSIS — I63412 Cerebral infarction due to embolism of left middle cerebral artery: Secondary | ICD-10-CM

## 2014-05-13 LAB — CBC WITH DIFFERENTIAL/PLATELET
BASOS ABS: 0 10*3/uL (ref 0.0–0.1)
Basophils Relative: 0 % (ref 0–1)
EOS ABS: 0.4 10*3/uL (ref 0.0–0.7)
Eosinophils Relative: 4 % (ref 0–5)
HCT: 42.1 % (ref 36.0–46.0)
Hemoglobin: 14.1 g/dL (ref 12.0–15.0)
LYMPHS PCT: 16 % (ref 12–46)
Lymphs Abs: 1.9 10*3/uL (ref 0.7–4.0)
MCH: 30.5 pg (ref 26.0–34.0)
MCHC: 33.5 g/dL (ref 30.0–36.0)
MCV: 91.1 fL (ref 78.0–100.0)
MONO ABS: 0.9 10*3/uL (ref 0.1–1.0)
Monocytes Relative: 8 % (ref 3–12)
NEUTROS PCT: 72 % (ref 43–77)
Neutro Abs: 8.7 10*3/uL — ABNORMAL HIGH (ref 1.7–7.7)
PLATELETS: 229 10*3/uL (ref 150–400)
RBC: 4.62 MIL/uL (ref 3.87–5.11)
RDW: 13.8 % (ref 11.5–15.5)
WBC: 12 10*3/uL — ABNORMAL HIGH (ref 4.0–10.5)

## 2014-05-13 LAB — GLUCOSE, CAPILLARY
GLUCOSE-CAPILLARY: 152 mg/dL — AB (ref 70–99)
Glucose-Capillary: 139 mg/dL — ABNORMAL HIGH (ref 70–99)
Glucose-Capillary: 135 mg/dL — ABNORMAL HIGH (ref 70–99)
Glucose-Capillary: 153 mg/dL — ABNORMAL HIGH (ref 70–99)

## 2014-05-13 MED ORDER — LEVOTHYROXINE SODIUM 25 MCG PO TABS
25.0000 ug | ORAL_TABLET | Freq: Every day | ORAL | Status: DC
  Administered 2014-05-14 – 2014-05-27 (×14): 25 ug via ORAL
  Filled 2014-05-13 (×15): qty 1

## 2014-05-13 MED ORDER — INSULIN ASPART 100 UNIT/ML ~~LOC~~ SOLN
0.0000 [IU] | Freq: Three times a day (TID) | SUBCUTANEOUS | Status: DC
  Administered 2014-05-14 (×2): 1 [IU] via SUBCUTANEOUS
  Administered 2014-05-15: 2 [IU] via SUBCUTANEOUS

## 2014-05-13 MED ORDER — SORBITOL 70 % SOLN: 30.0000 mL | Freq: Every day | Status: DC | PRN

## 2014-05-13 MED ORDER — ONDANSETRON HCL 4 MG/2ML IJ SOLN: 4.0000 mg | Freq: Four times a day (QID) | INTRAMUSCULAR | Status: DC | PRN

## 2014-05-13 MED ORDER — DILTIAZEM HCL ER COATED BEADS 120 MG PO CP24
120.0000 mg | ORAL_CAPSULE | Freq: Every day | ORAL | Status: DC
  Administered 2014-05-14 – 2014-05-27 (×14): 120 mg via ORAL
  Filled 2014-05-13 (×17): qty 1

## 2014-05-13 MED ORDER — PANTOPRAZOLE SODIUM 40 MG PO TBEC
40.0000 mg | DELAYED_RELEASE_TABLET | Freq: Every day | ORAL | Status: DC
  Administered 2014-05-14 – 2014-05-15 (×2): 40 mg via ORAL
  Filled 2014-05-13 (×2): qty 1

## 2014-05-13 MED ORDER — ACETAMINOPHEN 650 MG RE SUPP: 650.0000 mg | RECTAL | Status: DC | PRN

## 2014-05-13 MED ORDER — ASPIRIN EC 81 MG PO TBEC
81.0000 mg | DELAYED_RELEASE_TABLET | Freq: Every day | ORAL | Status: DC
  Administered 2014-05-14 – 2014-05-27 (×14): 81 mg via ORAL
  Filled 2014-05-13 (×16): qty 1

## 2014-05-13 MED ORDER — ACETAMINOPHEN 325 MG PO TABS: 650.0000 mg | ORAL_TABLET | ORAL | Status: DC | PRN

## 2014-05-13 MED ORDER — EZETIMIBE 10 MG PO TABS
10.0000 mg | ORAL_TABLET | Freq: Every day | ORAL | Status: DC
  Administered 2014-05-14 – 2014-05-27 (×14): 10 mg via ORAL
  Filled 2014-05-13 (×16): qty 1

## 2014-05-13 MED ORDER — CETYLPYRIDINIUM CHLORIDE 0.05 % MT LIQD
7.0000 mL | Freq: Two times a day (BID) | OROMUCOSAL | Status: DC
  Administered 2014-05-13 – 2014-05-27 (×26): 7 mL via OROMUCOSAL

## 2014-05-13 MED ORDER — SENNOSIDES-DOCUSATE SODIUM 8.6-50 MG PO TABS: 1.0000 | ORAL_TABLET | Freq: Every evening | ORAL | Status: DC | PRN

## 2014-05-13 MED ORDER — DONEPEZIL HCL 5 MG PO TABS
5.0000 mg | ORAL_TABLET | Freq: Every day | ORAL | Status: DC
Start: 2014-05-13 — End: 2014-05-27
  Administered 2014-05-13 – 2014-05-26 (×14): 5 mg via ORAL
  Filled 2014-05-13 (×16): qty 1

## 2014-05-13 MED ORDER — ONDANSETRON HCL 4 MG PO TABS: 4.0000 mg | ORAL_TABLET | Freq: Four times a day (QID) | ORAL | Status: DC | PRN

## 2014-05-13 MED ORDER — ROSUVASTATIN CALCIUM 5 MG PO TABS
5.0000 mg | ORAL_TABLET | Freq: Every day | ORAL | Status: DC
  Administered 2014-05-14 – 2014-05-27 (×14): 5 mg via ORAL
  Filled 2014-05-13 (×17): qty 1

## 2014-05-13 MED ORDER — DRONEDARONE HCL 400 MG PO TABS
400.0000 mg | ORAL_TABLET | Freq: Two times a day (BID) | ORAL | Status: DC
  Administered 2014-05-13 – 2014-05-27 (×28): 400 mg via ORAL
  Filled 2014-05-13 (×32): qty 1

## 2014-05-13 NOTE — Consult Note (Signed)
Patient name: [name] MRN: [id number] DOB: [birth year] Sex: female   Referred by: Roda ShuttersXu  Reason for referral:  Chief Complaint  Patient presents with  . Code Stroke    HISTORY OF PRESENT ILLNESS: The patient is a very nice 81 year old female mother of Dr. Shirlean Kellyobert Beckom. She was doing well at [facility]. Was at the reception desk on 05/08/2014 when she was noted to have right-sided weakness and facial drooping. Br[facility]. Was within the appropriate time window and therefore received TPA. Has a pacemaker and therefore was unable to have an MRI. Initial CT showed no bleed. She did have some worsening of her neurologic deficit and repeat CT scan the following day showed a small left brain bleed estimated at 7 cc. She has stabilized and is working with rehabilitation on regaining her right-sided strength. Does have some expressive aphasia as well. Denies any prior history of neurologic deficit. Workup to include CT angiogram shows severe bilateral internal carotid artery stenoses. Very calcified on the left.  Past Medical History  Diagnosis Date  . Pure hypercholesterolemia   . Hyperlipidemia     Tolerating medication well as of 11/27/12  . Hypothyroidism   . Carpal tunnel syndrome   . Benign hypertensive heart disease without heart failure   . Coronary atherosclerosis of native coronary artery   . Spinal stenosis of lumbar region   . Osteopenia   . Postsurgical percutaneous transluminal coronary angioplasty status   . Essential hypertension, benign   . Benign hypertensive kidney disease with chronic kidney disease stage I through stage IV, or unspecified   . Diabetes mellitus with chronic kidney disease   . NSTEMI (non-ST elevated myocardial infarction) 09/21/09    BM stent RCA  . Atrial fibrillation   . Tachy-brady syndrome     s/p PPM, battery change 08/01/09  . Sinoatrial node dysfunction   . Chronic anemia   .  Osteoarthritis   . H/O echocardiogram 07/19/09    EF = 60-65%  . Hyperglycemia     Mild  . Microalbuminuria 02/2010  . Vitamin D deficiency 10/2011  . Chronic renal disease, stage III     Past Surgical History  Procedure Laterality Date  . Carpal tunnel release Right 02/07/09  . Vesicovaginal fistula closure w/ tah    . Oophorectomy      With hysterectomy. One ovary removed-fibroids.  . Pacemaker insertion  2004    Gen change 08/04/09 by Dr. Amil AmenEdmunds. New pacing generator is a Medtronic EnRhythm, model [id number], serial [id number]  . Pacemaker generator change  08/04/09    By Dr. Amil AmenEdmunds. New pacing generator is a Medtronic EnRhythm, model [id number], serial [id number]  . Percutaneous coronary stent intervention (pci-s)  05/08/02    (3 tandem Cypher stent), proximal and mid LAD  . Coronary angioplasty with stent placement  09/21/09    By Dr. Amil AmenEdmunds. ACS, NSTEMI - BM stent RCA    History   Social History  . Marital Status: Widowed    Spouse Name: N/A  . Number of Children: 3  . Years of Education: N/A   Occupational History  .     Social History Main Topics  . Smoking status: Never Smoker   . Smokeless tobacco: Not on file  . Alcohol Use: Yes     Comment: occasional glass of wine  . Drug Use: No  . Sexual Activity: Not on file   Other Topics Concern  . Not  on file   Social History Narrative    Family History  Problem Relation Age of Onset  . Heart disease Mother   . Heart disease Father   . Diabetes Father   . Peripheral vascular disease Brother   . Cerebral palsy Son     Allergies as of 05/08/2014 - Review Complete 05/08/2014  Allergen Reaction Noted  . Penicillins  12/22/2012  . Sulfa antibiotics  12/22/2012    No current facility-administered medications on file prior to encounter.   Current Outpatient Prescriptions on File Prior to Encounter  Medication Sig Dispense Refill  . aspirin 325 MG tablet Take 325 mg by mouth daily.    Marland Kitchen levothyroxine  (SYNTHROID) 50 MCG tablet Take 25 mcg by mouth daily before breakfast.    . MULTAQ 400 MG tablet TAKE 1 TABLET BY MOUTH TWICE DAILY 60 tablet 10  . nitroGLYCERIN (NITROSTAT) 0.4 MG SL tablet Place 0.4 mg under the tongue every 5 (five) minutes as needed for chest pain (MAX 3 TABLETS).     . rosuvastatin (CRESTOR) 5 MG tablet Take 5 mg by mouth at bedtime.     Marland Kitchen ZETIA 10 MG tablet TAKE 1 TABLET BY MOUTH ONCE DAILY (Patient taking differently: TAKE 1 TABLET BY MOUTH ONCE DAILY at bedtime.) 30 tablet 9     REVIEW OF SYSTEMS:  Reviewed her history and physical with nothing to add  PHYSICAL EXAMINATION:  General: The patient is a well-nourished female, in no acute distress. She is sitting up in the chair working with physical therapy. Obvious facial droop. Alert oriented and answers questions appropriately Vital signs are BP 142/66 mmHg  Pulse 52  Temp(Src) 97.8 F (36.6 C) (Oral)  Resp 21  Ht 5' (1.524 m)  Wt 120 lb 9.5 oz (54.7 kg)  BMI 23.55 kg/m2  SpO2 96% Pulmonary: There is a good air exchange bilaterally without wheezing or rales. Abdomen: Soft and non-tender with normal pitch bowel sounds. No masses noted Musculoskeletal: There are no major deformities.  There is no significant extremity pain. Neurologic: Significant right arm weakness. Not able to raise her arm against gravity. Obvious facial droop. Skin: There are no ulcer or rashes noted. Psychiatric: The patient has normal affect. Cardiovascular: 2+ radial pulses bilaterally Carotid arteries without bruits bilaterally  CT angiogram of her neck revealed severe bilateral internal carotid artery stenoses at the origin. Normal internal carotid arteries distal to this.  Impression and Plan:  Left brain stroke with subsequent small intracranial bleed after receiving TPA. Discussed this at length with the patient. Explained that the most likely cause for her left brain event was her severe left carotid stenosis. Explained that  there is no urgent need for any treatment of this. Explained the most important event currently is ongoing rehabilitation and physical therapy. Did explain the possibility of recommendation for left endarterectomy for reduction of additional stroke. With her bilateral high-grade stenoses and her small intracranial bleed, would certainly defer any consideration for endarterectomy for at least 3-4 weeks. She would be at significant risk for reperfusion injury with endarterectomy with bilateral severe stenoses in light of her intracranial hemorrhage. Will continue to follow on the side lines as she continues her rehabilitation.    Aerion Bagdasarian [facility] Office: [phone]

## 2014-05-13 NOTE — Discharge Summary (Signed)
Stroke Discharge Summary  Patient ID: [name]   MRN: [id number]      DOB: 1918-09-22  Date of Admission: 05/08/2014 Date of Discharge: 05/13/2014  Attending Physician:  Marvel Plan, MD, Stroke MD  Consulting Physician(s):   vascular surgery  Patient's PCP:  Ginette Otto, MD  Discharge Diagnoses:  Active Problems:   CVA (cerebral infarction)   Cerebral hemorrhage   Received intravenous tissue plasminogen activator (tPA) in emergency department   Stroke   Cytotoxic brain edema   Afib, chronic   Carotid stenosis, bilateral   Brady/tachy syndrome s/p pacemaker  BMI  Body mass index is 23.55 kg/(m^2).  Past Medical History  Diagnosis Date  . Pure hypercholesterolemia   . Hyperlipidemia     Tolerating medication well as of 11/27/12  . Hypothyroidism   . Carpal tunnel syndrome   . Benign hypertensive heart disease without heart failure   . Coronary atherosclerosis of native coronary artery   . Spinal stenosis of lumbar region   . Osteopenia   . Postsurgical percutaneous transluminal coronary angioplasty status   . Essential hypertension, benign   . Benign hypertensive kidney disease with chronic kidney disease stage I through stage IV, or unspecified   . Diabetes mellitus with chronic kidney disease   . NSTEMI (non-ST elevated myocardial infarction) 09/21/09    BM stent RCA  . Atrial fibrillation   . Tachy-brady syndrome     s/p PPM, battery change 08/01/09  . Sinoatrial node dysfunction   . Chronic anemia   . Osteoarthritis   . H/O echocardiogram 07/19/09    EF = 60-65%  . Hyperglycemia     Mild  . Microalbuminuria 02/2010  . Vitamin D deficiency 10/2011  . Chronic renal disease, stage III    Past Surgical History  Procedure Laterality Date  . Carpal tunnel release Right 02/07/09  . Vesicovaginal fistula closure w/ tah    . Oophorectomy      With hysterectomy. One ovary removed-fibroids.  . Pacemaker insertion  2004    Gen change 08/04/09 by Dr.  Amil Amen. New pacing generator is a Medtronic EnRhythm, model D1939726, serial [id number]  . Pacemaker generator change  08/04/09    By Dr. Amil Amen. New pacing generator is a Medtronic EnRhythm, model D1939726, serial [id number]  . Percutaneous coronary stent intervention (pci-s)  05/08/02    (3 tandem Cypher stent), proximal and mid LAD  . Coronary angioplasty with stent placement  09/21/09    By Dr. Amil Amen. ACS, NSTEMI - BM stent RCA    Medications to be continued on Rehab .  stroke: mapping our early stages of recovery book   Does not apply Once  . antiseptic oral rinse  7 mL Mouth Rinse BID  . aspirin EC  81 mg Oral Daily  . diltiazem  120 mg Oral Daily  . donepezil  5 mg Oral QHS  . dronedarone  400 mg Oral BID WC  . ezetimibe  10 mg Oral Daily  . insulin aspart  0-9 Units Subcutaneous TID WC  . levothyroxine  25 mcg Oral QAC breakfast  . pantoprazole  40 mg Oral Daily  . rosuvastatin  5 mg Oral Daily    LABORATORY STUDIES CBC    Component Value Date/Time   WBC 12.0* 05/13/2014 0235   RBC 4.62 05/13/2014 0235   HGB 14.1 05/13/2014 0235   HCT 42.1 05/13/2014 0235   PLT 229 05/13/2014 0235   MCV 91.1 05/13/2014 0235   MCH 30.5  05/13/2014 0235   MCHC 33.5 05/13/2014 0235   RDW 13.8 05/13/2014 0235   LYMPHSABS 1.9 05/13/2014 0235   MONOABS 0.9 05/13/2014 0235   EOSABS 0.4 05/13/2014 0235   BASOSABS 0.0 05/13/2014 0235   CMP    Component Value Date/Time   NA 136 05/12/2014 0228   K 3.7 05/12/2014 0228   CL 104 05/12/2014 0228   CO2 23 05/12/2014 0228   GLUCOSE 165* 05/12/2014 0228   BUN 12 05/12/2014 0228   CREATININE 1.03 05/12/2014 0228   CALCIUM 8.7 05/12/2014 0228   PROT 7.1 05/08/2014 1652   ALBUMIN 3.6 05/08/2014 1652   AST 38* 05/08/2014 1652   ALT 29 05/08/2014 1652   ALKPHOS 63 05/08/2014 1652   BILITOT 0.6 05/08/2014 1652   GFRNONAA 45* 05/12/2014 0228   GFRAA 53* 05/12/2014 0228   COAGS Lab Results  Component Value Date   INR 1.13 05/08/2014    INR 0.99 09/22/2009   INR 1.00 07/18/2009   Lipid Panel    Component Value Date/Time   CHOL 137 05/09/2014 0333   TRIG 79 05/09/2014 0333   HDL 47 05/09/2014 0333   CHOLHDL 2.9 05/09/2014 0333   VLDL 16 05/09/2014 0333   LDLCALC 74 05/09/2014 0333   HgbA1C  Lab Results  Component Value Date   HGBA1C 7.5* 05/09/2014   Cardiac Panel (last 3 results) No results for input(s): CKTOTAL, CKMB, TROPONINI, RELINDX in the last 72 hours. Urinalysis    Component Value Date/Time   COLORURINE YELLOW 05/08/2014 1725   APPEARANCEUR HAZY* 05/08/2014 1725   LABSPEC 1.008 05/08/2014 1725   PHURINE 7.5 05/08/2014 1725   GLUCOSEU NEGATIVE 05/08/2014 1725   HGBUR SMALL* 05/08/2014 1725   BILIRUBINUR NEGATIVE 05/08/2014 1725   KETONESUR NEGATIVE 05/08/2014 1725   PROTEINUR NEGATIVE 05/08/2014 1725   UROBILINOGEN 0.2 05/08/2014 1725   NITRITE NEGATIVE 05/08/2014 1725   LEUKOCYTESUR MODERATE* 05/08/2014 1725   Urine Drug Screen     Component Value Date/Time   LABOPIA NONE DETECTED 05/08/2014 1725   COCAINSCRNUR NONE DETECTED 05/08/2014 1725   LABBENZ NONE DETECTED 05/08/2014 1725   AMPHETMU NONE DETECTED 05/08/2014 1725   THCU NONE DETECTED 05/08/2014 1725   LABBARB NONE DETECTED 05/08/2014 1725    Alcohol Level    Component Value Date/Time   ETH <5 05/08/2014 1659     SIGNIFICANT DIAGNOSTIC STUDIES I have personally reviewed the radiological images below and agree with the radiology interpretations.  Ct Head Wo Contrast 05/09/2014  Multiple new parenchymal and subarachnoid bleeds since 05/08/14.   Ct Head Wo Contrast 05/08/2014  1. Stable atrophy and white matter disease.  2. No acute intracranial abnormality.  3. Atherosclerosis.   CT angiogram of the head and neck 05/10/2014 1. Cytotoxic edema along the left peri-Rolandic cortex has developed compatible with MCA territory infarct. No mass effect. 2. Stable intra-axial and trace subarachnoid hemorrhage as seen on  05/09/2014. Anterior superior left frontal lobe intra-axial blood volume of 7 mL. 3. Progressed bilateral ICA origin stenosis since 2008 now with bilateral ICA RADIOGRAPHIC STRING SIGNS. 4. Patent left MCA M1 segment and bifurcation with no major branch occlusion, but irregular appearing posterior most sylvian division left MCA branch with attenuated enhancement. 5. Bilateral vertebral artery atherosclerosis with up to 50% vertebral artery stenosis on the right. The left vertebral artery origin is not well visualized, and there is distal left vertebral (V4 segment) stenosis with diminutive and irregular appearance of the vessel to the vertebrobasilar junction. The left PICA remains  patent. 6. Mild dependent opacity in the right lung could reflect atelectasis, but developing right lung infection would be difficult to exclude.  2D echo - Left ventricle: The cavity size was normal. There was severe concentric hypertrophy. Systolic function was normal. The estimated ejection fraction was in the range of 50% to 55%. Wall motion was normal; there were no regional wall motion abnormalities. - Aortic valve: Mildly calcified annulus. Trileaflet; normal thickness, mildly calcified leaflets. - Mitral valve: Moderately calcified annulus. There was mild to moderate regurgitation. - Left atrium: The atrium was severely dilated. - Tricuspid valve: There was mild-moderate regurgitation. - Pulmonary arteries: Systolic pressure was mildly increased. - Pericardium, extracardiac: A small pericardial effusion was identified. Impressions: - No cardiac source of embolism was identified, but cannot be ruled out on the basis of this examination.  CUS - Preliminary findings: Bilateral 40-59% internal carotid artery stenosis.     HISTORY OF PRESENT ILLNES [name] is an 81 y.o. female who resides in [place]. She has a history of Afib on ASA. She was noted to be doing well until 1610  05/08/2014 ([facility]) when she suddenly became confused, had slurred speech, right facial droop and right sided weakness. EMS was called and patient was brought to [facility] health as a code stroke. On arrival she was alert and oriented but continued to show slurred speech, right facial droop and right sided weakness. She was within the window for tPA with no contra indications. Decision was made to give tPA. She does have a history of GI bleed which is why she was taken off of coumadin. She denies any bleeding within the past month.  Modified Rankin: Rankin Score=0. Patient was administered TPA. She was admitted to the neuro ICU for further evaluation and treatment.   HOSPITAL COURSE Second day after admission to neuro ICU, patient condition deteriorated. She had increased hemiparesis overnight and dysarthria had increased to more expressive aphasia. Repeat CT head showed hemorrhagic transformation post tPA at left frontal parenchymal andsmall right pareital and left frontal subarachnoid hemorrhage. Her blood pressure goal was adjusted to 120-140, and she remain in ICU for close monitoring. CTA head and neck showed bilateral carotid stenosis with a string sign although carotid Doppler showed 40-59% stenosis bilaterally. 2-D echo showed 50-55% ejection fraction. Patient remained in A. fib rhythm, with intermittent RVR, on cardizem. Over the weekend, her condition gradually getting better, able to move right shoulder, right facial droop improved, and speech much better. Aspirin 81 was added. Vascular surgery was consulted, and will consider left CEA after CIR. Patient was evaluated by rehabilitation, was recommended to transfer to CIR. Patient was discharged to CIR today in good condition.    Stroke: Dominant left brain MCA branch infarcts s/p IV tPA, embolic secondary to known atrial fibrillation. Developed hemorrhagic transformation left MCA.  Resultant R hemiparesis, dysarthria and mild expressive  aphasia  CTA head and neck showed bilaterally ICAs string sign.  Carotid DopplerBilateral 40-59% internal carotid artery stenosis.   2D Echo - EF 50-55%. No cardiac source of embolism identified.  SCDs for VTE prophylaxis  DIET DYS 2 Room service appropriate?: Yes; Fluid consistency:: Thin; Fluid restriction:: Other (see comments)  Now on ASA 81 mg daily started on 05/11/2014  Ongoing aggressive stroke risk factor management  Therapy recommendations: CIR  Disposition: CIR planned for today  Atrial Fibrillation  Not on anticoagulation PTA secondary to hx GIB  May consider NOAC once hemorrhage resolves - now on ASA 81 mg QD  Rate  controlled, on Cardizem  Carotid stenosis  Bilateral ICA string sign  Vascular surgery was consulted with Dr. Arbie CookeyEarly  Recommend left CEA after CIR rehabilitation  Hypertension  Stable this am.   BP goal 120-140  Hyperlipidemia  Home meds: Zetia 10 mg & Crestor 5mg , resumed in hospital  LDL 74, goal < 70  Continue statin at discharge  Diabetes, type II  HgbA1c - 7.5 , goal < 7.0  SSI  Other Stroke Risk Factors  Advanced age  ETOH use  Coronary artery disease - NSTEMI, PTCA, stent 2011  Other Active Problems  Tachy brady syndrome s/p pacer  CKD stage 3. - BUN 12 creatinine 1.03 - 05/12/2014 Improving.  Opacity in the right lung noted on CTA  DISCHARGE EXAM Blood pressure 142/66, pulse 52, temperature 97.8 F (36.6 C), temperature source Oral, resp. rate 21, height 5' (1.524 m), weight 120 lb 9.5 oz (54.7 kg), SpO2 96 %.  Pleasant frail elderly caucasian lady not in distress. . Afebrile. Head is nontraumatic. Neck is supple without bruit. Cardiac exam no murmur or gallop but irregular heart sounds. Lungs are clear to auscultation. Distal pulses are well felt. Neurological Exam : Awake alert moderate dysarthria and mild expressive aphasia with difficulty in repetition and naming. Good comprehension.  Follows commands well. Pupils irregular but reactive. Vision acuity seems adequate. Blinks to threat bilaterally. Moderate right lower facial weakness. Tongue midline. Motor system exam right upper extremity 2/5 proximally at the shoulder only and 0/5 at the elbow hand and wrist. Right lower extremity no drift but minimum 4+/5 weakness of the hip flexors and ankle dorsiflexors. Normal strength on the left. Mild right hemi-sensory loss. Coordination normal in the lower extremity is and cannot be tested in the right upper extremity. Right plantar is local left downgoing.  Discharge Diet  DIET DYS 2 Room service appropriate?: Yes; Fluid consistency:: Thin; Fluid restriction:: Other (see comments) Thin liquids  DISCHARGE PLAN  Disposition:  [facility] for ongoing PT, OT and ST  aspirin 81 mg orally every day for secondary stroke prevention for now, will need anticoagulation after blood product resolves.  Recommend ongoing risk factor control by Primary Care Physician at time of discharge from inpatient rehabilitation.  Follow-up Ginette OttoSTONEKING,HAL THOMAS, MD in 2 weeks following discharge from rehab.  Follow-up with Dr. Delia HeadyPramod Sethi, Stroke Clinic in 2 months.   45 minutes were spent preparing discharge.  Marvel PlanJindong Shneur [place], MD PhD Stroke Neurology 05/13/2014 1:46 PM

## 2014-05-13 NOTE — Progress Notes (Signed)
I met with [name] and her son, [name], at bedside. They are both in agreement to admission to inpt rehab today. I will make the arrangements for admission today. RN CM and SW are aware. [phone]

## 2014-05-13 NOTE — Progress Notes (Signed)
Occupational Therapy Treatment Patient Details Name: [name] MRN: [id number] DOB: September 20, 1918 Today's Date: 05/13/2014    History of present illness pt presents with L MCA embolic infarcts post tpa.  After tpa worsening neurologic symptoms noted and pt found to have L Frotnal and Superficial Hemorrhage.  pt with PMH including NSTEMI, pacemaker, and DM.     OT comments  Pt improving since admission with following commands, cognitive status and has improved mildly with tending to her and finding her RUE.  Pt continues to be motivated to improve and is an excellent rehab candidate.  Follow Up Recommendations  CIR    Equipment Recommendations       Recommendations for Other Services      Precautions / Restrictions Precautions Precautions: Fall Restrictions Weight Bearing Restrictions: No       Mobility Bed Mobility Overal bed mobility: Needs Assistance Bed Mobility: Supine to Sit     Supine to sit: Mod assist Sit to supine: Min assist   General bed mobility comments: While getting up, pt required cues to tend to her RUE so it did not get caught underneath her.  Spoke with her at length about knowing where this arm is at all times.  Transfers Overall transfer level: Needs assistance Equipment used: 1 person hand held assist Transfers: Sit to/from [employer] Sit to Stand: Min assist Stand pivot transfers: Mod assist       General transfer comment: Pt following commands better today and less impulsive.     Balance Overall balance assessment: Needs assistance Sitting-balance support: Single extremity supported;Feet supported Sitting balance-Leahy Scale: Poor Sitting balance - Comments: pt able to sit on EOB with S at times today.  pt was able to self correct when leaning to the R.  Postural control: Right lateral lean Standing balance support: Bilateral upper extremity supported;During functional activity Standing balance-Leahy Scale:  Poor Standing balance comment: Pt must have outside assist to remain standing.                     ADL Overall ADL's : Needs assistance/impaired Eating/Feeding: Moderate assistance;Cueing for safety;Cueing for compensatory techinques;Sitting Eating/Feeding Details (indicate cue type and reason): Pt required repetitive cues to put food on L side of mouth (difficult to do when feeding self with L hand ) Grooming: Wash/dry hands;Wash/dry face;Oral care;Moderate assistance Grooming Details (indicate cue type and reason): Pt requires cues to use RUE as an assist.  Pt places items in R hand and expects it to work and continues to be surprised when it does not.  Pt can tell you that it is not working but when it functionally does not she is caught off guard.  Pt with increased ability to find R hand and arm and tend to that side of her body.             Lower Body Dressing: Maximal assistance;Sit to/from stand Lower Body Dressing Details (indicate cue type and reason): Pt able to cross legs up to beginning donning pants and to donn and doff socks with min assist for balance.  Pt transferred sit to stand x4 with mod assist to start and min assist by the end of the session in prep for pulling pants up in standing. Toilet Transfer: Moderate assistance;Stand-pivot;BSC Toilet Transfer Details (indicate cue type and reason): +1 assist which is improvement over +2 needed on Friday.         Functional mobility during ADLs: Moderate assistance;Cueing for sequencing;Cueing for safety General ADL  Comments: Pt doing slightly better with adls today.  Pt able to follow simple adl commands while sitting in chair.  Pt cont to require cues for safety with feeding and swallowing and cues to slow down and take her time while eating.      Vision Eye Alignment: Within Functional Limits Alignment/Gaze Preference: Within Defined Limits Ocular Range of Motion: Within Functional Limits Tracking/Visual  Pursuits: Able to track stimulus in all quads without difficulty             Perception     Praxis      Cognition   Behavior During Therapy: Impulsive Overall Cognitive Status: Impaired/Different from baseline Area of Impairment: Attention;Memory;Safety/judgement;Following commands;Awareness;Problem solving Orientation Level: Time Current Attention Level: Sustained Memory: Decreased short-term memory  Following Commands: Follows one step commands consistently Safety/Judgement: Decreased awareness of deficits;Decreased awareness of safety Awareness: Intellectual Problem Solving: Slow processing;Difficulty sequencing;Requires verbal cues;Requires tactile cues General Comments: Pt faster to respond today and has increased awaresness of her situation today although still decreased insight to deficits.    Extremity/Trunk Assessment               Exercises General Exercises - Upper Extremity Shoulder Flexion: AAROM;Right;10 reps;Seated Shoulder Extension: AAROM;Right;10 reps;Seated Shoulder ABduction: AAROM;Right;10 reps;Seated Shoulder ADduction: AAROM;Right;10 reps;Seated Elbow Flexion: PROM;Right;10 reps;Seated Elbow Extension: PROM;Right;10 reps;Seated Wrist Flexion: PROM;Right;10 reps;Seated Wrist Extension: PROM;Right;10 reps;Seated Digit Composite Flexion: PROM;Right;10 reps;Seated Composite Extension: PROM;Right;10 reps;Seated   Shoulder Instructions       General Comments      Pertinent Vitals/ Pain       Pain Assessment: No/denies pain  Home Living   Living Arrangements: Alone;Other (Comment) (ILF at [facility]) Available Help at Discharge: Other (Comment) (has different levels of care at [facility] that are availab) Type of Home: Independent living facility Home Access: Level entry     Home Layout: One level     Bathroom Shower/Tub: Producer, television/film/video: Handicapped height Bathroom Accessibility: Yes How Accessible: Accessible  via walker     Additional Comments: has call light near bed that at 8 am daily she calls to let staff know she is ok. Bathroom has call system to contact staff if needed  Lives With: Alone    Prior Functioning/Environment Level of Independence: Independent        Comments: pt did own adls without AD; does not drive, shops   Frequency Min 3X/week     Progress Toward Goals  OT Goals(current goals can now be found in the care plan section)  Progress towards OT goals: Progressing toward goals  Acute Rehab OT Goals Patient Stated Goal: to rehab then home per her son OT Goal Formulation: With patient/family Time For Goal Achievement: 05/17/14 Potential to Achieve Goals: Good ADL Goals Pt Will Perform Grooming: with min assist;bed level Pt Will Transfer to Toilet: stand pivot transfer;bedside commode;with min assist Additional ADL Goal #1: Pt will be able to roll right and left with min A to A with BADLs Additional ADL Goal #2: Pt will be able to maintain sitting balance on EOB with min A 5+minutes  Plan Discharge plan remains appropriate    Co-evaluation                 End of Session     Activity Tolerance Patient tolerated treatment well   Patient Left in chair;with call bell/phone within reach;Other (comment) (with ST)   Nurse Communication Mobility status        Time: 4098-1191 OT Time  Calculation (min): 27 min  Charges: OT General Charges $OT Visit: 1 Procedure OT Treatments $Self Care/Home Management : 23-37 mins  Hope BuddsJones, Kiondra Caicedo Anne 05/13/2014, 1:39 PM  [phone]

## 2014-05-13 NOTE — Progress Notes (Signed)
Pt alert and orientated x 4 on admission. Answered questions appropriately. R facial droop, RUE with limited mobility. 2+ A stand/pivot transfer from w/c to recliner. Speech clear, with some word finding noted. Pt orientated to Rehab expectation and therapy schedule. Diagnostic specific information provided.

## 2014-05-13 NOTE — Progress Notes (Signed)
PMR Admission Coordinator Pre-Admission Assessment  Patient: [name] is an 81 y.o., female MRN: [id number] DOB: 1918/02/16 Height: 5' (152.4 cm) (Per pt report) Weight: 54.7 kg (120 lb 9.5 oz)  Insurance Information HMO: PPO: PCP: IPA: 80/20: yes OTHER: No HMO PRIMARY: Medicare a and b Policy#: [id number] d Subscriber: pt Benefits: Phone #: palmetto online 05/10/14  Eff. Date: 10/18/85 Deduct: $1288 Out of Pocket Max: none Life Max: none CIR: 100% SNF: 20 full days Outpatient: 80% Co-Pay: 20% Home Health: 100% Co-Pay: none DME: 80% Co-Pay: 20% Providers: pt choice  SECONDARY: BC[employer] of [place] supplement Policy#: [id number] Subscriber: pt No auth required  Medicaid Application Date: Case Manager:  Disability Application Date: Case Worker:   Emergency Contact Information Contact Information    Name Relation Home Work Mobile   [place] Son [phone]     Pedro Earls   [phone]   Yevonne Aline Daughter   [phone]   No name specified         Current Medical History  Patient Admitting Diagnosis: Left MCA cva s/p tpa with hemorrhage  History of Present Illness:[name] is a 81 y.o. right hand female with history of hyperlipidemia, diabetes mellitus and peripheral neuropathy, chronic renal insufficiency baseline creatinine 1.36, Coronary artery disease with angioplasty, atrial fibrillation with pacemaker maintained on aspirin. She had been on Coumadin in the past but discontinued due to GI bleed. Patient is a [facility]. Patient independent prior to admission. Presented 05/08/2014 with altered mental status slurred speech and right facial droop with  right-sided weakness. Initial cranial CT scan showed no acute intracranial abnormality. She did receive TPA. Patient with increased right side hemiparesis as well as expressive aphasia. Repeat CT of the head showed multiple new parenchymal and subarachnoid bleeds since prior study. Echocardiogram with ejection fraction of 55% no wall motion abnormalities without embolism. Carotid Dopplers of bilateral 40-59% ICA stenosis.Now with bilateral ICA radiographic string signs. To follow up with CVTS for possible CEA after inpatient rehabilitation stay. Maintain on a dysphagia 2 thin liquid diet. Neurology consulted and maintained on aspirin 81 mg daily.    Total: 5 NIH    Past Medical History  Past Medical History  Diagnosis Date  . Pure hypercholesterolemia   . Hyperlipidemia     Tolerating medication well as of 11/27/12  . Hypothyroidism   . Carpal tunnel syndrome   . Benign hypertensive heart disease without heart failure   . Coronary atherosclerosis of native coronary artery   . Spinal stenosis of lumbar region   . Osteopenia   . Postsurgical percutaneous transluminal coronary angioplasty status   . Essential hypertension, benign   . Benign hypertensive kidney disease with chronic kidney disease stage I through stage IV, or unspecified   . Diabetes mellitus with chronic kidney disease   . NSTEMI (non-ST elevated myocardial infarction) 09/21/09    BM stent RCA  . Atrial fibrillation   . Tachy-brady syndrome     s/p PPM, battery change 08/01/09  . Sinoatrial node dysfunction   . Chronic anemia   . Osteoarthritis   . H/O echocardiogram 07/19/09    EF = 60-65%  . Hyperglycemia     Mild  . Microalbuminuria 02/2010  . Vitamin D deficiency 10/2011  . Chronic renal disease, stage III     Family History  family history includes Cerebral palsy in her son; Diabetes in her father; Heart disease in her father and  mother; Peripheral vascular disease in her brother.  Prior Rehab/Hospitalizations: none  Current Medications   Current facility-administered medications:  . stroke: mapping our early stages of recovery book, , Does not apply, Once, Ulice Dash, PA-C . acetaminophen (TYLENOL) tablet 650 mg, 650 mg, Oral, Q4H PRN **OR** acetaminophen (TYLENOL) suppository 650 mg, 650 mg, Rectal, Q4H PRN, Ulice Dash, PA-C . antiseptic oral rinse (CPC / CETYLPYRIDINIUM CHLORIDE 0.05%) solution 7 mL, 7 mL, Mouth Rinse, BID, Micki Riley, MD, 7 mL at 05/12/14 2137 . aspirin EC tablet 81 mg, 81 mg, Oral, Daily, David L Rinehuls, PA-C, 81 mg at 05/13/14 1013 . diltiazem (CARDIZEM CD) 24 hr capsule 120 mg, 120 mg, Oral, Daily, Micki Riley, MD, 120 mg at 05/13/14 1012 . donepezil (ARICEPT) tablet 5 mg, 5 mg, Oral, QHS, Micki Riley, MD, 5 mg at 05/12/14 2137 . dronedarone (MULTAQ) tablet 400 mg, 400 mg, Oral, BID WC, Ulice Dash, PA-C, 400 mg at 05/13/14 0753 . ezetimibe (ZETIA) tablet 10 mg, 10 mg, Oral, Daily, Ulice Dash, PA-C, 10 mg at 05/13/14 1013 . hydrALAZINE (APRESOLINE) injection 10 mg, 10 mg, Intravenous, Q8H PRN, Micki Riley, MD, 10 mg at 05/09/14 1726 . insulin aspart (novoLOG) injection 0-9 Units, 0-9 Units, Subcutaneous, TID WC, David L Rinehuls, PA-C, 1 Units at 05/13/14 1245 . labetalol (NORMODYNE,TRANDATE) injection 10-20 mg, 10-20 mg, Intravenous, Q10 min PRN, Micki Riley, MD, 10 mg at 05/09/14 2245 . levothyroxine (SYNTHROID, LEVOTHROID) tablet 25 mcg, 25 mcg, Oral, QAC breakfast, Ulice Dash, PA-C, 25 mcg at 05/13/14 0747 . pantoprazole (PROTONIX) EC tablet 40 mg, 40 mg, Oral, Daily, Micki Riley, MD, 40 mg at 05/13/14 1012 . rosuvastatin (CRESTOR) tablet 5 mg, 5 mg, Oral, Daily, Ulice Dash, PA-C, 5 mg at 05/13/14 1012 . senna-docusate (Senokot-S) tablet 1 tablet, 1 tablet, Oral, QHS PRN, Ulice Dash, PA-C, 1 tablet at 05/08/14  2330  Patients Current Diet: DIET DYS 2 Room service appropriate?: Yes; Fluid consistency:: Thin; Fluid restriction:: Other (see comments) Pt does not drink caffeinated beverages due to increasing heart rate issues with caffeine  Precautions / Restrictions Precautions Precautions: Fall Restrictions Weight Bearing Restrictions: No   Prior Activity Level Community (5-7x/wk): pt does own grocery shopping weekly; goes to dining hall daily at dinner. Shops for clothing. Pt gets her own breakfast and lunch in her apartment. Does not usually cook something. Bathes and dresses self. Has pillbox system for her meds. Taken by staff weekly to get her groceries. Does her own shopping for clothes and does her own adls. Goes to dining hall by elevator nightly for dinner. Does not use AD. Does not drive. Son reports mild short term memory issues. Staff in ICU report increased confusion noted at night. Has been at [facility] for 6 years  Journalist, newspaper / Equipment Home Assistive Devices/Equipment: CBG Meter, Eyeglasses  Prior Functional Level Prior Function Level of Independence: Independent Comments: pt did own adls without AD; does not drive, shops  Current Functional Level Cognition  Arousal/Alertness: Awake/alert Overall Cognitive Status: No family/caregiver present to determine baseline cognitive functioning (pt has some short term memory issues pta) Current Attention Level: Sustained Orientation Level: Oriented to person, Oriented to place, Oriented to time, Oriented to situation Safety/Judgement: Decreased awareness of safety, Decreased awareness of deficits Attention: Sustained Sustained Attention: Appears intact Memory: (difficult to assess given severity of aphasia) Awareness: Impaired Awareness Impairment: Intellectual impairment (50% aware of deficits) Problem Solving: Appears intact (for basic problem solving during familiar tasks) Safety/Judgment: (TBD)    Extremity  Assessment (includes Sensation/Coordination)  Upper Extremity Assessment: Defer to OT evaluation RUE Deficits / Details: trace amount of movement for shoulder shrug, shoulder extension, shoulder flexion, and shoulder internal rotation RUE Coordination: decreased gross motor, decreased fine motor  Lower Extremity Assessment: Generalized weakness, RLE deficits/detail RLE Deficits / Details: pt demos good functional strength, but decreased coordination.  RLE Coordination: decreased fine motor    ADLs  Overall ADL's : Needs assistance/impaired Eating/Feeding: Moderate assistance (non-dominant hand and swallowing precautions) Grooming: Moderate assistance, Bed level Upper Body Bathing: Moderate assistance, Bed level Lower Body Bathing: Total assistance, Bed level Upper Body Dressing : Total assistance, Bed level Lower Body Dressing: Total assistance, Bed level Toilet Transfer: Moderate assistance, +2 for physical assistance, Stand-pivot (bed> recliner going to pt's left) Toileting- Clothing Manipulation and Hygiene: Total assistance (with mod A +2 sit<>stand)    Mobility  Overal bed mobility: Needs Assistance Bed Mobility: Sit to Supine Supine to sit: Mod assist Sit to supine: Min assist General bed mobility comments: pt with somewhat uncontrolled return to supine, but no A needed.     Transfers  Overall transfer level: Needs assistance Equipment used: 2 person hand held assist Transfers: Sit to/from Stand, Stand Pivot Transfers Sit to Stand: Mod assist, +2 physical assistance Stand pivot transfers: Mod assist, +2 physical assistance General transfer comment: pt impulsive and attempting to reach for recliner immediately upon coming to sitting EOB. pt needs cues for safety, attending to balance and R UE.     Ambulation / Gait / Stairs / Engineer, drillingWheelchair Mobility       Posture / Balance Dynamic Sitting Balance Sitting balance - Comments: pt leans heavily  topt leaning heavily to R side and no awareness of balance deficits. When attempting to orient pt to midline, pt asks "Why are you pushing me?" Once in midline pt having difficulty maintaining.  Balance Overall balance assessment: Needs assistance Sitting-balance support: Single extremity supported, Feet supported Sitting balance-Leahy Scale: Zero Sitting balance - Comments: pt leans heavily topt leaning heavily to R side and no awareness of balance deficits. When attempting to orient pt to midline, pt asks "Why are you pushing me?" Once in midline pt having difficulty maintaining.  Postural control: Right lateral lean Standing balance support: No upper extremity supported, During functional activity Standing balance-Leahy Scale: Zero Standing balance comment: Decent strength in legs, but not in trunk or RUE    Special needs/care consideration  Bowel mgmt: continent last BM 4/24 Bladder mgmt: continent Diabetic mgmt yes Hgb A1c 7.5   Previous Home Environment Living Arrangements: Alone, Other (Comment) ([facility]F at [facility]) Lives With: Alone Available Help at Discharge: Other (Comment) ([facility] that are avail[facility]) Type of Home: Independent living facility Care Facility Name: [facility] Home Layout: One level Home Access: Level entry Bathroom Shower/Tub: Health visitorWalk-in shower Bathroom Toilet: Handicapped height Bathroom Accessibility: Yes How Accessible: Accessible via walker Home Care Services: No Additional Comments: has call light near bed that at 8 am daily she calls to let staff know she is ok. Bathroom has call system to contact staff if needed  Discharge Living Setting Plans for Discharge Living Setting: Other (Comment) (pt can go ILF, ALD, SNF or memory care unit at d/c depending) Type of Home at Discharge: Other (Comment) ([facility] as needed) Discharge Home Layout: One level Discharge Home Access:  Level entry Discharge Bathroom Shower/Tub: Walk-in shower Discharge Bathroom Toilet: Handicapped height Discharge Bathroom Accessibility: Yes How Accessible: Accessible via  walker Does the patient have any problems obtaining your medications?: No Molly Maduro and Irving Burton do a pillbox for pt weekly)  Social/Family/Support Systems Patient Roles: Parent Contact Information: Renaldo Harrison and Darel Hong Anticipated Caregiver: staff at [employer] Information: see above Ability/Limitations of Caregiver: Son works as MD at [employer], Irving Burton is a Charity fundraiser unemployed at present, Darel Hong is in [employer]. Caregiver Availability: 24/7 Discharge Plan Discussed with Primary Caregiver: Yes Is Caregiver In Agreement with Plan?: Yes Does Caregiver/Family have Issues with Lodging/Transportation while Pt is in Rehab?: No  Goals/Additional Needs Patient/Family Goal for Rehab: supervision to min assist with PT, OT, and SLP Expected length of stay: ELOS 16 to 20 days Cultural Considerations: From [place], Jewish ( not Kosher) Dietary Needs: Not Kosher, decaffeinated beverages only Equipment Needs: bed alarm due to impulsive and decreased safety awareness Special Service Needs: CVTS/Dr. Tawanna Cooler Early to be consulted regarding issues of possible need for CEA at d/c from rehab Pt/Family Agrees to Admission and willing to participate: Yes Program Orientation Provided & Reviewed with Pt/Caregiver Including Roles & Responsibilities: Yes  Decrease burden of Care through IP rehab admission: n/a  Possible need for SNF placement upon discharge: [facility] has 4 levels of care at d/c. ILF, ALF, SNF/rehab and memory care unit. Son is prepared for pt to d/c to any level depending on rehab recommendations at the time of her d/c.  Patient Condition: This patient's medical and functional status has changed since the consult dated: 05/10/2014 in which the Rehabilitation Physician determined and documented that the patient's  condition is appropriate for intensive rehabilitative care in an inpatient rehabilitation facility. See "History of Present Illness" (above) for medical update. Functional changes are: mod assist. Patient's medical and functional status update has been discussed with the Rehabilitation physician and patient remains appropriate for inpatient rehabilitation. Will admit to inpatient rehab today.  Preadmission Screen Completed By: Clois Dupes, 05/13/2014 12:47 PM ______________________________________________________________________  Discussed status with Dr. Riley Kill on 05/13/14 at 1247 and received telephone approval for admission today.  Admission Coordinator: Clois Dupes, time 1247 Date 05/13/2014          Cosigned by: Ranelle Oyster, MD at 05/13/2014 1:31 PM  Revision History     Date/Time User Provider Type Action   05/13/2014 1:31 PM Ranelle Oyster, MD Physician Cosign   05/13/2014 12:48 PM Standley Brooking, RN Rehab Admission Coordinator Sign

## 2014-05-13 NOTE — Progress Notes (Signed)
Physical Therapy Treatment Patient Details Name: [name] MRN: [id number] DOB: Jul 21, 1918 Today's Date: 05/13/2014    History of Present Illness pt presents with L MCA embolic infarcts post tpa.  After tpa worsening neurologic symptoms noted and pt found to have L Frotnal and Superficial Hemorrhage.  pt with PMH including NSTEMI, pacemaker, and DM.      PT Comments    Pt much improved today with better awareness and overall decreased A needed for mobility.  Pt able to ambulate short distance today with 2 person A.  Plan for pt to D/C to CIR today.  Will continue to folow if remains on acute.    Follow Up Recommendations  CIR     Equipment Recommendations  None recommended by PT    Recommendations for Other Services       Precautions / Restrictions Precautions Precautions: Fall Restrictions Weight Bearing Restrictions: No    Mobility  Bed Mobility Overal bed mobility: Needs Assistance Bed Mobility: Sit to Supine       Sit to supine: Min assist   General bed mobility comments: pt with somewhat uncontrolled return to supine, but no A needed.    Transfers Overall transfer level: Needs assistance Equipment used: 2 person hand held assist Transfers: Sit to/from UGI CorporationStand;Stand Pivot Transfers Sit to Stand: Min assist;+2 physical assistance Stand pivot transfers: Mod assist;+2 physical assistance       General transfer comment: pt less impulsive today and following cues well.  pt repeated transfer x3 today.    Ambulation/Gait Ambulation/Gait assistance: Mod assist;+2 physical assistance Ambulation Distance (Feet): 5 Feet (x2) Assistive device: 2 person hand held assist Gait Pattern/deviations: Step-to pattern;Decreased step length - left;Decreased stance time - right;Trunk flexed     General Gait Details: pt indicates dizziness in standing, BP stable, but no nystagmus noted.  pt needs cues for upright posture and encouragement as pt tends to focus on her feet.      Stairs            Wheelchair Mobility    Modified Rankin (Stroke Patients Only) Modified Rankin (Stroke Patients Only) Pre-Morbid Rankin Score: No significant disability Modified Rankin: Moderately severe disability     Balance Overall balance assessment: Needs assistance Sitting-balance support: Single extremity supported;Feet supported Sitting balance-Leahy Scale: Poor Sitting balance - Comments: pt much improved awareness of midline and maintaining balance today.     Standing balance support: Bilateral upper extremity supported;During functional activity Standing balance-Leahy Scale: Poor                      Cognition Arousal/Alertness: Awake/alert Behavior During Therapy: Impulsive Overall Cognitive Status: No family/caregiver present to determine baseline cognitive functioning (pt has some short term memory issues pta)                      Exercises      General Comments        Pertinent Vitals/Pain Pain Assessment: No/denies pain    Home Living   Living Arrangements: Alone;Other (Comment) (ILF at [name]) Available Help at Discharge: Other (Comment) (has different levels of care at [facility] that are availab) Type of Home: Independent living facility Home Access: Level entry   Home Layout: One level   Additional Comments: has call light near bed that at 8 am daily she calls to let staff know she is ok. Bathroom has call system to contact staff if needed    Prior Function Level of Independence: Independent  Comments: pt did own adls without AD; does not drive, shops   PT Goals (current goals can now be found in the care plan section) Acute Rehab PT Goals Patient Stated Goal: to rehab then home per her son PT Goal Formulation: Patient unable to participate in goal setting Time For Goal Achievement: 05/24/14 Potential to Achieve Goals: Good Progress towards PT goals: Progressing toward goals    Frequency  Min  4X/week    PT Plan Current plan remains appropriate    Co-evaluation             End of Session Equipment Utilized During Treatment: Gait belt Activity Tolerance: Patient tolerated treatment well Patient left: in bed;with call bell/phone within reach;with bed alarm set     Time: 1308-6578 PT Time Calculation (min) (ACUTE ONLY): 20 min  Charges:  $Gait Training: 8-22 mins                    G CodesSunny [name], [name] [phone] 05/13/2014, 1:11 PM

## 2014-05-13 NOTE — H&P (View-Only) (Signed)
Physical Medicine and Rehabilitation Admission H&P    Chief Complaint  Patient presents with  . Code Stroke  : HPI: [name] is a 81 y.o. right hand female with history of hyperlipidemia, diabetes mellitus and peripheral neuropathy, chronic renal insufficiency baseline creatinine 1.36, Coronary artery disease with angioplasty, atrial fibrillation with pacemaker maintained on aspirin. She had been on Coumadin in the past but discontinued due to GI bleed. Patient is a resident of [facility]. Patient independent prior to admission. Presented 05/08/2014 with altered mental status slurred speech and right facial droop with right-sided weakness. Initial cranial CT scan showed no acute intracranial abnormality. She did receive TPA. Patient with increased right side hemiparesis as well as expressive aphasia. Repeat CT of the head showed multiple new parenchymal and subarachnoid bleeds since prior study. Echocardiogram with ejection fraction of 55% no wall motion abnormalities without embolism. Carotid Dopplers of bilateral 40-59% ICA stenosis. Maintain on a dysphagia 2 thin liquid diet. Neurology consulted and maintained on aspirin 81 mg daily. Physical and occupational therapy evaluations completed. Request made for physical medicine rehabilitation consult. Patient was admitted for a comprehensive rehabilitation program  ROS Review of Systems  Cardiovascular: Positive for palpitations.  Gastrointestinal: Positive for constipation.  Musculoskeletal: Positive for myalgias.  All other systems reviewed and are negative   Past Medical History  Diagnosis Date  . Pure hypercholesterolemia   . Hyperlipidemia     Tolerating medication well as of 11/27/12  . Hypothyroidism   . Carpal tunnel syndrome   . Benign hypertensive heart disease without heart failure   . Coronary atherosclerosis of native coronary artery   . Spinal stenosis of lumbar region   . Osteopenia   .  Postsurgical percutaneous transluminal coronary angioplasty status   . Essential hypertension, benign   . Benign hypertensive kidney disease with chronic kidney disease stage I through stage IV, or unspecified   . Diabetes mellitus with chronic kidney disease   . NSTEMI (non-ST elevated myocardial infarction) 09/21/09    BM stent RCA  . Atrial fibrillation   . Tachy-brady syndrome     s/p PPM, battery change 08/01/09  . Sinoatrial node dysfunction   . Chronic anemia   . Osteoarthritis   . H/O echocardiogram 07/19/09    EF = 60-65%  . Hyperglycemia     Mild  . Microalbuminuria 02/2010  . Vitamin D deficiency 10/2011  . Chronic renal disease, stage III    Past Surgical History  Procedure Laterality Date  . Carpal tunnel release Right 02/07/09  . Vesicovaginal fistula closure w/ tah    . Oophorectomy      With hysterectomy. One ovary removed-fibroids.  . Pacemaker insertion  2004    Gen change 08/04/09 by Dr. Leonia Reeves. New pacing generator is a Medtronic EnRhythm, model H8937337, serial [id number]  . Pacemaker generator change  08/04/09    By Dr. Leonia Reeves. New pacing generator is a Medtronic EnRhythm, model H8937337, serial [id number]  . Percutaneous coronary stent intervention (pci-s)  05/08/02    (3 tandem Cypher stent), proximal and mid LAD  . Coronary angioplasty with stent placement  09/21/09    By Dr. Leonia Reeves. ACS, NSTEMI - BM stent RCA   Family History  Problem Relation Age of Onset  . Heart disease Mother   . Heart disease Father   . Diabetes Father   . Peripheral vascular disease Brother   . Cerebral palsy Son    Social History:  reports that she has never  smoked. She does not have any smokeless tobacco history on file. She reports that she drinks alcohol. She reports that she does not use illicit drugs. Allergies:  Allergies  Allergen Reactions  . Other Other (See Comments)    Allergic to crab meat, but no shellfish allergy (can eat lobster, shrimp, etc.)  Crab meat causes  angioedema of the throat.  Marland Kitchen Penicillins   . Sulfa Antibiotics    Medications Prior to Admission  Medication Sig Dispense Refill  . Calcium Carbonate-Vitamin D (CALTRATE 600+D PO) Take 1 tablet by mouth daily.    Marland Kitchen aspirin 325 MG tablet Take 325 mg by mouth daily.    Marland Kitchen CARTIA XT 120 MG 24 hr capsule Take 120 mg by mouth.   4  . donepezil (ARICEPT) 5 MG tablet Take 5 mg by mouth at bedtime.  3  . levothyroxine (SYNTHROID) 50 MCG tablet Take 25 mcg by mouth daily before breakfast.    . MULTAQ 400 MG tablet TAKE 1 TABLET BY MOUTH TWICE DAILY 60 tablet 10  . nitroGLYCERIN (NITROSTAT) 0.4 MG SL tablet Place 0.4 mg under the tongue every 5 (five) minutes as needed for chest pain (MAX 3 TABLETS).     . rosuvastatin (CRESTOR) 5 MG tablet Take 5 mg by mouth at bedtime.     Marland Kitchen ZETIA 10 MG tablet TAKE 1 TABLET BY MOUTH ONCE DAILY (Patient taking differently: TAKE 1 TABLET BY MOUTH ONCE DAILY at bedtime.) 30 tablet 9    Home: Home Living Family/patient expects to be discharged to:: Inpatient rehab   Functional History: Prior Function Comments: Pt reports she was bathing and dressing herself PTA  Functional Status:  Mobility: Bed Mobility Overal bed mobility: Needs Assistance Bed Mobility: Supine to Sit Supine to sit: Mod assist General bed mobility comments: pt does attempt to A with mobility, but neglectful of R UE and needs cues to attend to UE's position.   Transfers Overall transfer level: Needs assistance Equipment used: 2 person hand held assist Transfers: Sit to/from Stand, Stand Pivot Transfers Sit to Stand: Mod assist, +2 physical assistance Stand pivot transfers: Mod assist, +2 physical assistance General transfer comment: pt impulsive and attempting to reach for recliner immediately upon coming to sitting EOB.  pt needs cues for safety, attending to balance and R UE.        ADL: ADL Overall ADL's : Needs assistance/impaired Eating/Feeding: Moderate assistance (non-dominant  hand and swallowing precautions) Grooming: Moderate assistance, Bed level Upper Body Bathing: Moderate assistance, Bed level Lower Body Bathing: Total assistance, Bed level Upper Body Dressing : Total assistance, Bed level Lower Body Dressing: Total assistance, Bed level Toilet Transfer: Moderate assistance, +2 for physical assistance, Stand-pivot (bed> recliner going to pt's left) Toileting- Clothing Manipulation and Hygiene: Total assistance (with mod A +2 sit<>stand)  Cognition: Cognition Overall Cognitive Status: No family/caregiver present to determine baseline cognitive functioning Arousal/Alertness: Awake/alert Orientation Level: Oriented to person, Oriented to place, Oriented to time, Disoriented to situation Attention: Sustained Sustained Attention: Appears intact Memory:  (difficult to assess given severity of aphasia) Awareness: Impaired Awareness Impairment: Intellectual impairment (50% aware of deficits) Problem Solving: Appears intact (for basic problem solving during familiar tasks) Safety/Judgment:  (TBD) Cognition Arousal/Alertness: Awake/alert Behavior During Therapy: Impulsive Overall Cognitive Status: No family/caregiver present to determine baseline cognitive functioning Area of Impairment: Orientation, Attention, Memory, Safety/judgement, Awareness, Problem solving Orientation Level: Disoriented to, Place, Time, Situation Current Attention Level: Sustained Memory: Decreased short-term memory Safety/Judgement: Decreased awareness of safety, Decreased awareness of  deficits Awareness: Intellectual Problem Solving: Slow processing, Difficulty sequencing, Requires verbal cues, Requires tactile cues  Physical Exam: Blood pressure 128/85, pulse 73, temperature 98.1 F (36.7 C), temperature source Oral, resp. rate 19, height 5' (1.524 m), weight 54.7 kg (120 lb 9.5 oz), SpO2 96 %. Physical Exam Gen: pt pleasant, alert, looks younger than stated age HENT: oral  mucosa pink, dentition good Eyes: EOM are normal.  Neck: Normal range of motion. Neck supple. No thyromegaly present.  Cardiovascular:  Cardiac rate controlled without murmur Respiratory: Effort normal and breath sounds normal. No respiratory distress.no wheezes, rales  GI: Soft. Bowel sounds are normal. She exhibits no distension. No tenderness Neurological: She is alert and oriented to place and reason she's here. Right central 7 and mild tongue deviation. She is hard of hearing bilaterally but still is able to converse without additional effort or volume. She followed all simple commands. There wasa  Occasionally delay in processing and language , but this appears much improved from prior notes in chart.  RUE: 2- deltoid, biceps, triceps, 0/5 wrist and hand. RLE: + to 3- hf, ke, and adfl/apf. Sensation appeared to be grossly intact to light touch and pain in right upper, lower, and face. Perhaps may be a subtle difference in light touch. Right tone: DTR's 3+, toes up. foot is hypersentitive toutouch. 5 minus/5 in the left deltoid, biceps, triceps, grip, hip flexor, knee extensor, ankle dorsi flexors and plantar flexion Psych: pleasant and appropriate EXH:BZJI odor of urine   Results for orders placed or performed during the hospital encounter of 05/08/14 (from the past 48 hour(s))  Glucose, capillary     Status: Abnormal   Collection Time: 05/11/14  8:25 AM  Result Value Ref Range   Glucose-Capillary 139 (H) 70 - 99 mg/dL  Glucose, capillary     Status: Abnormal   Collection Time: 05/11/14 12:13 PM  Result Value Ref Range   Glucose-Capillary 173 (H) 70 - 99 mg/dL  Glucose, capillary     Status: Abnormal   Collection Time: 05/11/14  6:15 PM  Result Value Ref Range   Glucose-Capillary 194 (H) 70 - 99 mg/dL  Glucose, capillary     Status: Abnormal   Collection Time: 05/11/14  9:30 PM  Result Value Ref Range   Glucose-Capillary 151 (H) 70 - 99 mg/dL  Basic metabolic panel     Status:  Abnormal   Collection Time: 05/12/14  2:28 AM  Result Value Ref Range   Sodium 136 135 - 145 mmol/L   Potassium 3.7 3.5 - 5.1 mmol/L   Chloride 104 96 - 112 mmol/L   CO2 23 19 - 32 mmol/L   Glucose, Bld 165 (H) 70 - 99 mg/dL   BUN 12 6 - 23 mg/dL   Creatinine, Ser 1.03 0.50 - 1.10 mg/dL   Calcium 8.7 8.4 - 10.5 mg/dL   GFR calc non Af Amer 45 (L) >90 mL/min   GFR calc Af Amer 53 (L) >90 mL/min    Comment: (NOTE) The eGFR has been calculated using the CKD EPI equation. This calculation has not been validated in all clinical situations. eGFR's persistently <90 mL/min signify possible Chronic Kidney Disease.    Anion gap 9 5 - 15  Glucose, capillary     Status: Abnormal   Collection Time: 05/12/14  7:34 AM  Result Value Ref Range   Glucose-Capillary 163 (H) 70 - 99 mg/dL  Glucose, capillary     Status: Abnormal   Collection Time: 05/12/14 12:02 PM  Result Value Ref Range   Glucose-Capillary 124 (H) 70 - 99 mg/dL  Glucose, capillary     Status: Abnormal   Collection Time: 05/12/14  4:22 PM  Result Value Ref Range   Glucose-Capillary 112 (H) 70 - 99 mg/dL  Glucose, capillary     Status: Abnormal   Collection Time: 05/12/14  9:31 PM  Result Value Ref Range   Glucose-Capillary 172 (H) 70 - 99 mg/dL  CBC with Differential/Platelet     Status: Abnormal   Collection Time: 05/13/14  2:35 AM  Result Value Ref Range   WBC 12.0 (H) 4.0 - 10.5 K/uL   RBC 4.62 3.87 - 5.11 MIL/uL   Hemoglobin 14.1 12.0 - 15.0 g/dL   HCT 42.1 36.0 - 46.0 %   MCV 91.1 78.0 - 100.0 fL   MCH 30.5 26.0 - 34.0 pg   MCHC 33.5 30.0 - 36.0 g/dL   RDW 13.8 11.5 - 15.5 %   Platelets 229 150 - 400 K/uL   Neutrophils Relative % 72 43 - 77 %   Neutro Abs 8.7 (H) 1.7 - 7.7 K/uL   Lymphocytes Relative 16 12 - 46 %   Lymphs Abs 1.9 0.7 - 4.0 K/uL   Monocytes Relative 8 3 - 12 %   Monocytes Absolute 0.9 0.1 - 1.0 K/uL   Eosinophils Relative 4 0 - 5 %   Eosinophils Absolute 0.4 0.0 - 0.7 K/uL   Basophils Relative  0 0 - 1 %   Basophils Absolute 0.0 0.0 - 0.1 K/uL   Dg Chest Port 1 View  05/12/2014   CLINICAL DATA:  Lung opacity on prior CT  EXAM: PORTABLE CHEST - 1 VIEW  COMPARISON:  05/10/2014.  FINDINGS: Dual lead LEFT subclavian cardiac pacemaker. Cardiopericardial silhouette within normal limits. Bilateral basilar opacity, greater on the LEFT when compared to the RIGHT. Differential considerations are atelectasis, edema, aspiration. Probable small LEFT pleural effusion. No upper lobe consolidation is identified.  IMPRESSION: Bibasilar opacity with differential considerations discussed above. No upper lobe opacification is identified.   Electronically Signed   By: Dereck Ligas M.D.   On: 05/12/2014 16:49       Medical Problem List and Plan: 1. Functional deficits secondary to cardio-embolic, left MCA branch infarct with hemorrhagic transformation after TPA 2.  DVT Prophylaxis/Anticoagulation: SCDs. Monitor for any signs of DVT 3. Pain Management: Tylenol as needed---minimal pain at present 4. Mood/history of memory loss: Aricept 5 mg daily at bedtime 5. Neuropsych: This patient is capable of making decisions on her own behalf. 6. Skin/Wound Care: Routine skin checks 7. Fluids/Electrolytes/Nutrition: Strict I&O's with follow-up chemistries 8. Dysphagia/a fascia. Dysphagia 2 thin liquids. Follow per speech therapy 9. Hypertension/atrial fibrillation/pacemaker. Cardizem CD 120 mg daily,Multaq 400 mg twice a day, cardiac rate control 10.Bilat carotid Stenosis. VVS to see to help determine potential surgical needs moving forward 11. Hypothyroidism. Synthroid 12. Diabetes mellitus of peripheral neuropathy. Hemoglobin A1c 7.5. Sliding scale insulin. Check blood sugars before meals and at bedtime 13. Chronic renal insufficiency. Baseline creatinine 1.36 14. CAD/NSTEMI/angioplasty. Continue aspirin. No chest pain or shortness of breath 15. Hyperlipidemia. Zetia, Crestor 16. Low grade temp, leukocytosis:  urine with odor---check ua/cx   Post Admission Physician Evaluation: 1. Functional deficits secondary  to embolic left MCA distribution infarct with hemorrhagic transformation after TPA 2. Patient is admitted to receive collaborative, interdisciplinary care between the physiatrist, rehab nursing staff, and therapy team. 3. Patient's level of medical complexity and substantial therapy needs in context of that  medical necessity cannot be provided at a lesser intensity of care such as a SNF. 4. Patient has experienced substantial functional loss from his/her baseline which was documented above under the "Functional History" and "Functional Status" headings.  Judging by the patient's diagnosis, physical exam, and functional history, the patient has potential for functional progress which will result in measurable gains while on inpatient rehab.  These gains will be of substantial and practical use upon discharge  in facilitating mobility and self-care at the household level. 5. Physiatrist will provide 24 hour management of medical needs as well as oversight of the therapy plan/treatment and provide guidance as appropriate regarding the interaction of the two. 6. 24 hour rehab nursing will assist with bladder management, bowel management, safety, skin/wound care, disease management, medication administration, pain management and patient education  and help integrate therapy concepts, techniques,education, etc. 7. PT will assess and treat for/with: Lower extremity strength, range of motion, stamina, balance, functional mobility, safety, adaptive techniques and equipment, NMR, visual perceptual rx, stroke education , community reintegration, ego support.   Goals are: supervision to min assist. 8. OT will assess and treat for/with: ADL's, functional mobility, safety, upper extremity strength, adaptive techniques and equipment, NMR, cognitive perceptual awareness, ego support, leisure awareness .   Goals are:  supervision to min assist. Therapy may proceed with showering this patient. 9. SLP will assess and treat for/with: speech, swallowing, communication, cognition.  Goals are: min assist to mod I. 10. Case Management and Social Worker will assess and treat for psychological issues and discharge planning. 11. Team conference will be held weekly to assess progress toward goals and to determine barriers to discharge. 12. Patient will receive at least 3 hours of therapy per day at least 5 days per week. 13. ELOS: 16-22 days       14. Prognosis:  excellent     Meredith Staggers, MD, [facility] 05/13/2014   05/13/2014

## 2014-05-13 NOTE — Progress Notes (Signed)
Erick ColaceAndrew E Kirsteins, MD Physician Signed Physical Medicine and Rehabilitation Consult Note 05/10/2014 11:15 AM  Related encounter: ED to Hosp-Admission (Discharged) from 05/08/2014 in [facility]M TRAUMA NEURO ICU    Expand All Collapse All        Physical Medicine and Rehabilitation Consult Reason for Consult: Abdominal left brain MCA branch infarct status post IV TPA embolic secondary to known atrial fibrillation Referring Physician: Dr. Pearlean BrownieSethi   HPI: [name] is a 81 y.o. right hand female with history of hyperlipidemia, chronic renal insufficiency baseline creatinine 1.36, Coronary artery disease with angioplasty, atrial fibrillation with pacemaker maintained on aspirin. She had been on Coumadin in the past but discontinued due to GI bleed. Patient [facility] a [facility]. Patient independent prior to admission. Presented 05/08/2014 with altered mental status slurred speech and right facial droop with right-sided weakness. Initial cranial CT scan showed no acute intracranial abnormality. She did receive TPA. Patient with increased right side hemiparesis as well as expressive aphasia. Repeat CT of the head showed multiple new parenchymal and subarachnoid bleeds since prior study. Echocardiogram with ejection fraction of 55% no wall motion abnormalities without embolism. Maintain on a dysphagia 2 thin liquid diet. Neurology consulted with workup ongoing. Request made for physical medicine rehabilitation consult. Patient does not remember me from prior meetings. Review of Systems  Cardiovascular: Positive for palpitations.  Gastrointestinal: Positive for constipation.  Musculoskeletal: Positive for myalgias.  All other systems reviewed and are negative.  Past Medical History  Diagnosis Date  . Pure hypercholesterolemia   . Hyperlipidemia     Tolerating medication well as of 11/27/12  . Hypothyroidism   . Carpal tunnel  syndrome   . Benign hypertensive heart disease without heart failure   . Coronary atherosclerosis of native coronary artery   . Spinal stenosis of lumbar region   . Osteopenia   . Postsurgical percutaneous transluminal coronary angioplasty status   . Essential hypertension, benign   . Benign hypertensive kidney disease with chronic kidney disease stage I through stage IV, or unspecified   . Diabetes mellitus with chronic kidney disease   . NSTEMI (non-ST elevated myocardial infarction) 09/21/09    BM stent RCA  . Atrial fibrillation   . Tachy-brady syndrome     s/p PPM, battery change 08/01/09  . Sinoatrial node dysfunction   . Chronic anemia   . Osteoarthritis   . H/O echocardiogram 07/19/09    EF = 60-65%  . Hyperglycemia     Mild  . Microalbuminuria 02/2010  . Vitamin D deficiency 10/2011  . Chronic renal disease, stage III    Past Surgical History  Procedure Laterality Date  . Carpal tunnel release Right 02/07/09  . Vesicovaginal fistula closure w/ tah    . Oophorectomy      With hysterectomy. One ovary removed-fibroids.  . Pacemaker insertion  2004    Gen change 08/04/09 by Dr. Amil AmenEdmunds. New pacing generator is a Medtronic EnRhythm, model [id number], serial [id number]  . Pacemaker generator change  08/04/09    By Dr. Amil AmenEdmunds. New pacing generator is a Medtronic EnRhythm, model [id number], serial [id number]  . Percutaneous coronary stent intervention (pci-s)  05/08/02    (3 tandem Cypher stent), proximal and mid LAD  . Coronary angioplasty with stent placement  09/21/09    By Dr. Amil AmenEdmunds. ACS, NSTEMI - BM stent RCA   Family History  Problem Relation Age of Onset  . Heart disease Mother   . Heart disease Father   .  Diabetes Father   . Peripheral vascular disease Brother   . Cerebral palsy Son    Social History:  reports that she has never  smoked. She does not have any smokeless tobacco history on file. She reports that she drinks alcohol. She reports that she does not use illicit drugs. Allergies:  Allergies  Allergen Reactions  . Penicillins   . Sulfa Antibiotics    Medications Prior to Admission  Medication Sig Dispense Refill  . aspirin 325 MG tablet Take 325 mg by mouth daily.    Marland Kitchen CARTIA XT 120 MG 24 hr capsule Take 120 mg by mouth.   4  . donepezil (ARICEPT) 5 MG tablet Take 5 mg by mouth at bedtime.  3  . levothyroxine (SYNTHROID) 50 MCG tablet Take 25 mcg by mouth daily before breakfast.    . MULTAQ 400 MG tablet TAKE 1 TABLET BY MOUTH TWICE DAILY 60 tablet 10  . nitroGLYCERIN (NITROSTAT) 0.4 MG SL tablet Place 0.4 mg under the tongue every 5 (five) minutes as needed for chest pain (MAX 3 TABLETS).     . rosuvastatin (CRESTOR) 5 MG tablet Take 5 mg by mouth daily.    Marland Kitchen ZETIA 10 MG tablet TAKE 1 TABLET BY MOUTH ONCE DAILY 30 tablet 9    Home: Home Living Family/patient expects to be discharged to:: Assisted living  Functional History:   Functional Status:  Mobility:          ADL:    Cognition: Cognition Overall Cognitive Status: No family/caregiver present to determine baseline cognitive functioning Arousal/Alertness: Awake/alert Orientation Level: Oriented to person, Disoriented to situation, Oriented to place, Disoriented to time Attention: Sustained Sustained Attention: Appears intact Memory: (difficult to assess given severity of aphasia) Awareness: Impaired Awareness Impairment: Intellectual impairment (50% aware of deficits) Problem Solving: Appears intact (for basic problem solving during familiar tasks) Safety/Judgment: (TBD) Cognition Overall Cognitive Status: No family/caregiver present to determine baseline cognitive functioning  Blood pressure 100/51, pulse 59, temperature 98.4 F (36.9 C), temperature source Oral, resp.  rate 20, height 5' (1.524 m), weight 56.6 kg (124 lb 12.5 oz), SpO2 98 %. Physical Exam  Vitals reviewed. HENT:  Right facial droop  Eyes: EOM are normal.  Neck: Normal range of motion. Neck supple. No thyromegaly present.  Cardiovascular:  Cardiac rate controlled  Respiratory: Effort normal and breath sounds normal. No respiratory distress.  GI: Soft. Bowel sounds are normal. She exhibits no distension.  Neurological: She is alert.  Makes good eye contact with examiner. She does exhibit some expressive and receptive aphasia. She was able answer simple questions and follow simple commands  Skin: Skin is warm and dry.  Motor strength is 0/5 in the right upper extremity 4 minus in the right hip flexor and extensor ankle dorsiflexor and plantar flexor 5 minus/5 in the left deltoid, biceps, triceps, grip, hip flexor, knee extensor, ankle dorsi flexors and plantar flexion Sensation difficult to fully assess secondary to aphasia however she is able to sense pain which in the right upper and right lower limb. Difficulty following two-step commands, difficulty with left right discrimination as well as body part identification Unable to name simple objects   Lab Results Last 24 Hours    Results for orders placed or performed during the hospital encounter of 05/08/14 (from the past 24 hour(s))  Glucose, capillary Status: Abnormal   Collection Time: 05/09/14 11:25 AM  Result Value Ref Range   Glucose-Capillary 156 (H) 70 - 99 mg/dL   Comment 1  Notify RN    Comment 2 Document in Chart   Glucose, capillary Status: Abnormal   Collection Time: 05/09/14 5:49 PM  Result Value Ref Range   Glucose-Capillary 125 (H) 70 - 99 mg/dL   Comment 1 Notify RN    Comment 2 Document in Chart   Glucose, capillary Status: Abnormal   Collection Time: 05/09/14 8:24 PM  Result Value Ref Range   Glucose-Capillary 219 (H) 70 - 99 mg/dL  Glucose,  capillary Status: Abnormal   Collection Time: 05/09/14 11:05 PM  Result Value Ref Range   Glucose-Capillary 162 (H) 70 - 99 mg/dL  CBC Status: Abnormal   Collection Time: 05/10/14 3:07 AM  Result Value Ref Range   WBC 11.5 (H) 4.0 - 10.5 K/uL   RBC 4.51 3.87 - 5.11 MIL/uL   Hemoglobin 13.7 12.0 - 15.0 g/dL   HCT 16.1 09.6 - 04.5 %   MCV 92.5 78.0 - 100.0 fL   MCH 30.4 26.0 - 34.0 pg   MCHC 32.9 30.0 - 36.0 g/dL   RDW 40.9 81.1 - 91.4 %   Platelets 208 150 - 400 K/uL  Basic metabolic panel Status: Abnormal   Collection Time: 05/10/14 3:07 AM  Result Value Ref Range   Sodium 136 135 - 145 mmol/L   Potassium 4.0 3.5 - 5.1 mmol/L   Chloride 106 96 - 112 mmol/L   CO2 23 19 - 32 mmol/L   Glucose, Bld 141 (H) 70 - 99 mg/dL   BUN 13 6 - 23 mg/dL   Creatinine, Ser 7.82 (H) 0.50 - 1.10 mg/dL   Calcium 8.6 8.4 - 95.6 mg/dL   GFR calc non Af Amer 39 (L) >90 mL/min   GFR calc Af Amer 45 (L) >90 mL/min   Anion gap 7 5 - 15  Glucose, capillary Status: Abnormal   Collection Time: 05/10/14 3:20 AM  Result Value Ref Range   Glucose-Capillary 153 (H) 70 - 99 mg/dL  Glucose, capillary Status: Abnormal   Collection Time: 05/10/14 7:41 AM  Result Value Ref Range   Glucose-Capillary 127 (H) 70 - 99 mg/dL   Comment 1 Notify RN    Comment 2 Document in Chart       Imaging Results (Last 48 hours)    Ct Angio Head W/cm &/or Wo Cm  05/10/2014 ADDENDUM REPORT: 05/10/2014 09:53 ADDENDUM: Study discussed by telephone with NP SHARON BIBY on 05/10/2014 at 0933 hours. Electronically Signed By: Odessa Fleming M.D. On: 05/10/2014 09:53   05/10/2014 CLINICAL DATA: 81 year old female code stroke treated with IV tPA. Intracranial hemorrhage. Initial encounter. EXAM: CT ANGIOGRAPHY HEAD AND NECK TECHNIQUE: Multidetector CT imaging of the head and neck  was performed using the standard protocol during bolus administration of intravenous contrast. Multiplanar CT image reconstructions and MIPs were obtained to evaluate the vascular anatomy. Carotid stenosis measurements (when applicable) are obtained utilizing NASCET criteria, using the distal internal carotid diameter as the denominator. CONTRAST: 50mL OMNIPAQUE IOHEXOL 350 MG/ML SOLN COMPARISON: Head CTs without contrast 05/09/2014 and earlier. CTA head and neck 04/09/2006. FINDINGS: CT HEAD Brain: Anterior superior left frontal lobe intra-axial round hemorrhage has not significantly changed measuring 22-24 mm diameter. Estimated hemorrhage volume 7 mL. Surrounding trace subarachnoid hemorrhage. Small volume subarachnoid hemorrhage in the central sulcus is stable. Trace subarachnoid hemorrhage in the right parietal lobe is stable. No intraventricular hemorrhage or ventriculomegaly. Left Motor strip and peri-Rolandic cortex and subcortical white matter hypodensity has developed since 05/08/2014 compatible with cytotoxic edema. No significant mass effect. Stable  gray-white matter differentiation elsewhere with chronic confluent white matter hypodensity. No midline shift or significant intracranial hemorrhage. Calvarium and skull base: Intact. Paranasal sinuses: Stable minor paranasal sinus mucosal thickening. Orbits: Stable and negative orbit and scalp soft tissues. CTA NECK Skeleton: Advanced degenerative changes in the cervical spine. Osteopenia. No acute osseous abnormality identified. Other neck: Partially visible left chest cardiac pacemaker and leads. Mild dependent patchy opacity in the right upper lobe on series 7, image 12. No superior mediastinal lymphadenopathy. Stable thyroid. Larynx, pharynx, parapharyngeal spaces, retropharyngeal space, sublingual space, submandibular glands and parotid glands are within normal limits. No cervical lymphadenopathy. Aortic arch: 3 vessel arch  configuration. Moderate arch atherosclerosis has not significantly changed since 2008. No great vessel origin stenosis. Right carotid system: Progressed soft and calcified plaque at the right carotid bifurcation, now all with high-grade stenosis, radiographic string sign at the right ICA origin and proximal bulb (series 11, image 95). Despite this, the right ICA is patent and otherwise negative to the skullbase. Left carotid system: Normal left CCA origin. Mild plaque proximal to the left carotid bifurcation. Chronic bulky calcified plaque at the left ICA origin and bulb. Radiographic string sign (series 7, image 68) through the proximal 8 mm of the left ICA, superimposed on tortuosity of the vessel just beyond the bulb with a high-grade stenosis and kinked appearance (series 9, image 117). Retropharyngeal course then of the left ICA which more distally is normal to the skullbase. Vertebral arteries:No proximal subclavian artery stenosis. Calcified plaque at the right vertebral artery origin but with up to only mild stenosis (coronal image 128 series 9). Distal V1 calcified plaque with up to 50% stenosis. Minimal V2 segment atherosclerosis, and no additional stenosis of the right vertebral artery to the skullbase. Partially obscured left vertebral artery origin due to paravertebral venous contrast reflux. Contiguous calcified plaque in the proximal left subclavian artery suspected to involve the left vertebral artery origin and up to high-grade stenosis is possible. Calcified plaque in the V1 segment is superimposed with less than 50 percent stenosis. Mild calcified plaque in the V2 segment with no stenosis. No additional hemodynamically significant stenosis to the skullbase. CTA HEAD Posterior circulation: Both distal vertebral arteries are patent with calcified plaque on the right and soft and calcified plaque on the left. There is hemodynamically significant stenosis of the distal left vertebral artery  which appears diminutive. The left PICA origin remains patent. No significant distal right vertebral artery stenosis. Normal right PICA origin. Patent vertebrobasilar junction. No basilar artery stenosis. SCA and PCA origins are normal. Normal posterior communicating arteries. Bilateral PCA branches are within normal limits. Anterior circulation: Bilateral calcified ICA siphon plaque appears to result in less than 50% stenosis bilaterally. Ophthalmic and posterior communicating artery origins are normal. Carotid termini are patent. MCA and ACA origins are patent. Anterior communicating artery and bilateral ACA branches are within normal limits. Right MCA M1 segment and branches are within normal limits. Left MCA M1 segment is patent. Left MCA bifurcation is patent without stenosis. Left MCA M2 branches appear stable compared to 2008. No major left MCA branch occlusion is identified. However, there is decreased flow in at posterior branch of the posterior MCA division seen on series 12, image 33. Venous sinuses: Patent. Anatomic variants: None. Delayed phase: No abnormal enhancement identified. IMPRESSION: 1. Cytotoxic edema along the left peri-Rolandic cortex has developed compatible with MCA territory infarct. No mass effect. 2. Stable intra-axial and trace subarachnoid hemorrhage as seen on 05/09/2014. Anterior superior left frontal  lobe intra-axial blood volume of 7 mL. 3. Progressed bilateral ICA origin stenosis since 2008 now with bilateral ICA RADIOGRAPHIC STRING SIGNS. 4. Patent left MCA M1 segment and bifurcation with no major branch occlusion, but irregular appearing posterior most sylvian division left MCA branch with attenuated enhancement. 5. Bilateral vertebral artery atherosclerosis with up to 50% vertebral artery stenosis on the right. The left vertebral artery origin is not well visualized, and there is distal left vertebral (V4 segment) stenosis with diminutive and irregular appearance of the  vessel to the vertebrobasilar junction. The left PICA remains patent. 6. Mild dependent opacity in the right lung could reflect atelectasis, but developing right lung infection would be difficult to exclude. Electronically Signed: By: Odessa Fleming M.D. On: 05/10/2014 09:28   Ct Head Wo Contrast  05/09/2014 CLINICAL DATA: Worsening NIHSS score. Evaluate for hemorrhagic transformation of cerebral infarction. History of atrial fibrillation. History of IV tPA administration. Subsequent encounter. EXAM: CT HEAD WITHOUT CONTRAST TECHNIQUE: Contiguous axial images were obtained from the base of the skull through the vertex without intravenous contrast. COMPARISON: 05/08/2014. FINDINGS: Multiple new parenchymal and subarachnoid hemorrhages since yesterday's scan. These include -LEFT frontal cortex, 23 x 25 mm, slight hematocrit level; image 17. -RIGHT parietal subarachnoid space near the vertex; image 19. -LEFT posterior frontal cortex and subarachnoid central sulcus, 6 x 7 mm parenchymal, images 21-22. Areas of nonhemorrhagic cytotoxic edema affect the LEFT precentral and postcentral gyri as seen on images 19 and 20. None of these areas were visible initially on the scan of 05/08/2014. Stable mild atrophy. Stable chronic microvascular ischemic change. Moderate vascular calcification. Moderate hyperostosis. BILATERAL cataract extraction. No sinus or mastoid disease. IMPRESSION: Multiple new parenchymal and subarachnoid bleeds since yesterday scan. See discussion above. Critical Value/emergent results were called by telephone at the time of interpretation on 05/09/2014 at 9:46 am to Dr. Annie Main, who verbally acknowledged these results. Electronically Signed By: Davonna Belling M.D. On: 05/09/2014 09:47   Ct Head Wo Contrast  05/08/2014 CLINICAL DATA: Right-sided weakness. 81 year old female. Code stroke. Slurred speech, confusion, and right-sided weakness began low 1 hr ago. EXAM: CT HEAD WITHOUT  CONTRAST TECHNIQUE: Contiguous axial images were obtained from the base of the skull through the vertex without intravenous contrast. COMPARISON: CT head without contrast 10/18/2011. FINDINGS: Mild atrophy and white matter disease is stable. Wall no acute cortical infarct, hemorrhage, or mass lesion is present. The basal ganglia are intact. The insular ribbon is intact. Ventricles are proportionate to the degree of atrophy. No significant extraaxial fluid collection is present. Will the paranasal sinuses and mastoid air cells are clear. Atherosclerotic calcifications are present at the dural margin of vertebral arteries as well as the cavernous internal carotid arteries bilaterally. ASPECTS score = 10/10 [place] Stroke Program Early CT Score Normal score = 10 IMPRESSION: 1. Stable atrophy and white matter disease. 2. No acute intracranial abnormality. 3. Atherosclerosis. These results were called by telephone at the time of interpretation on 05/08/2014 at 5:00 pm to Dr. Ritta Slot , who verbally acknowledged these results. Electronically Signed By: Marin Roberts M.D. On: 05/08/2014 17:00   Ct Angio Neck W/cm &/or Wo/cm  05/10/2014 ADDENDUM REPORT: 05/10/2014 09:53 ADDENDUM: Study discussed by telephone with NP SHARON BIBY on 05/10/2014 at 0933 hours. Electronically Signed By: Odessa Fleming M.D. On: 05/10/2014 09:53   05/10/2014 CLINICAL DATA: 81 year old female code stroke treated with IV tPA. Intracranial hemorrhage. Initial encounter. EXAM: CT ANGIOGRAPHY HEAD AND NECK TECHNIQUE: Multidetector CT imaging of the head and neck was performed  using the standard protocol during bolus administration of intravenous contrast. Multiplanar CT image reconstructions and MIPs were obtained to evaluate the vascular anatomy. Carotid stenosis measurements (when applicable) are obtained utilizing NASCET criteria, using the distal internal carotid diameter as the denominator. CONTRAST:  50mL OMNIPAQUE IOHEXOL 350 MG/ML SOLN COMPARISON: Head CTs without contrast 05/09/2014 and earlier. CTA head and neck 04/09/2006. FINDINGS: CT HEAD Brain: Anterior superior left frontal lobe intra-axial round hemorrhage has not significantly changed measuring 22-24 mm diameter. Estimated hemorrhage volume 7 mL. Surrounding trace subarachnoid hemorrhage. Small volume subarachnoid hemorrhage in the central sulcus is stable. Trace subarachnoid hemorrhage in the right parietal lobe is stable. No intraventricular hemorrhage or ventriculomegaly. Left Motor strip and peri-Rolandic cortex and subcortical white matter hypodensity has developed since 05/08/2014 compatible with cytotoxic edema. No significant mass effect. Stable gray-white matter differentiation elsewhere with chronic confluent white matter hypodensity. No midline shift or significant intracranial hemorrhage. Calvarium and skull base: Intact. Paranasal sinuses: Stable minor paranasal sinus mucosal thickening. Orbits: Stable and negative orbit and scalp soft tissues. CTA NECK Skeleton: Advanced degenerative changes in the cervical spine. Osteopenia. No acute osseous abnormality identified. Other neck: Partially visible left chest cardiac pacemaker and leads. Mild dependent patchy opacity in the right upper lobe on series 7, image 12. No superior mediastinal lymphadenopathy. Stable thyroid. Larynx, pharynx, parapharyngeal spaces, retropharyngeal space, sublingual space, submandibular glands and parotid glands are within normal limits. No cervical lymphadenopathy. Aortic arch: 3 vessel arch configuration. Moderate arch atherosclerosis has not significantly changed since 2008. No great vessel origin stenosis. Right carotid system: Progressed soft and calcified plaque at the right carotid bifurcation, now all with high-grade stenosis, radiographic string sign at the right ICA origin and proximal bulb (series 11, image 95). Despite this, the right  ICA is patent and otherwise negative to the skullbase. Left carotid system: Normal left CCA origin. Mild plaque proximal to the left carotid bifurcation. Chronic bulky calcified plaque at the left ICA origin and bulb. Radiographic string sign (series 7, image 68) through the proximal 8 mm of the left ICA, superimposed on tortuosity of the vessel just beyond the bulb with a high-grade stenosis and kinked appearance (series 9, image 117). Retropharyngeal course then of the left ICA which more distally is normal to the skullbase. Vertebral arteries:No proximal subclavian artery stenosis. Calcified plaque at the right vertebral artery origin but with up to only mild stenosis (coronal image 128 series 9). Distal V1 calcified plaque with up to 50% stenosis. Minimal V2 segment atherosclerosis, and no additional stenosis of the right vertebral artery to the skullbase. Partially obscured left vertebral artery origin due to paravertebral venous contrast reflux. Contiguous calcified plaque in the proximal left subclavian artery suspected to involve the left vertebral artery origin and up to high-grade stenosis is possible. Calcified plaque in the V1 segment is superimposed with less than 50 percent stenosis. Mild calcified plaque in the V2 segment with no stenosis. No additional hemodynamically significant stenosis to the skullbase. CTA HEAD Posterior circulation: Both distal vertebral arteries are patent with calcified plaque on the right and soft and calcified plaque on the left. There is hemodynamically significant stenosis of the distal left vertebral artery which appears diminutive. The left PICA origin remains patent. No significant distal right vertebral artery stenosis. Normal right PICA origin. Patent vertebrobasilar junction. No basilar artery stenosis. SCA and PCA origins are normal. Normal posterior communicating arteries. Bilateral PCA branches are within normal limits. Anterior circulation: Bilateral  calcified ICA siphon plaque appears to result  in less than 50% stenosis bilaterally. Ophthalmic and posterior communicating artery origins are normal. Carotid termini are patent. MCA and ACA origins are patent. Anterior communicating artery and bilateral ACA branches are within normal limits. Right MCA M1 segment and branches are within normal limits. Left MCA M1 segment is patent. Left MCA bifurcation is patent without stenosis. Left MCA M2 branches appear stable compared to 2008. No major left MCA branch occlusion is identified. However, there is decreased flow in at posterior branch of the posterior MCA division seen on series 12, image 33. Venous sinuses: Patent. Anatomic variants: None. Delayed phase: No abnormal enhancement identified. IMPRESSION: 1. Cytotoxic edema along the left peri-Rolandic cortex has developed compatible with MCA territory infarct. No mass effect. 2. Stable intra-axial and trace subarachnoid hemorrhage as seen on 05/09/2014. Anterior superior left frontal lobe intra-axial blood volume of 7 mL. 3. Progressed bilateral ICA origin stenosis since 2008 now with bilateral ICA RADIOGRAPHIC STRING SIGNS. 4. Patent left MCA M1 segment and bifurcation with no major branch occlusion, but irregular appearing posterior most sylvian division left MCA branch with attenuated enhancement. 5. Bilateral vertebral artery atherosclerosis with up to 50% vertebral artery stenosis on the right. The left vertebral artery origin is not well visualized, and there is distal left vertebral (V4 segment) stenosis with diminutive and irregular appearance of the vessel to the vertebrobasilar junction. The left PICA remains patent. 6. Mild dependent opacity in the right lung could reflect atelectasis, but developing right lung infection would be difficult to exclude. Electronically Signed: By: Odessa Fleming M.D. On: 05/10/2014 09:28     Assessment/Plan: Diagnosis: Left hemiparesis and aphasia 1. Does the need for  close, 24 hr/day medical supervision in concert with the patient's rehab needs make it unreasonable for this patient to be served in a less intensive setting? Yes 2. Co-Morbidities requiring supervision/potential complications: Atrial fibrillation,, chronic renal insufficiency,Diabetes mellitus, tachybradycardia syndrome status post pacemaker placement 3. Due to bladder management, bowel management, safety, skin/wound care, disease management, medication administration and patient education, does the patient require 24 hr/day rehab nursing? Yes 4. Does the patient require coordinated care of a physician, rehab nurse, PT (1-2 hrs/day, 5 days/week), OT (11-2 hrs/day, 5 days/week) and SLP (0.5-1 hrs/day, 5 days/week) to address physical and functional deficits in the context of the above medical diagnosis(es)? Yes Addressing deficits in the following areas: balance, endurance, locomotion, strength, transferring, bowel/bladder control, bathing, dressing, feeding, grooming, toileting, cognition, speech, language, swallowing and psychosocial support 5. Can the patient actively participate in an intensive therapy program of at least 3 hrs of therapy per day at least 5 days per week? Yes 6. The potential for patient to make measurable gains while on inpatient rehab is excellent 7. Anticipated functional outcomes upon discharge from inpatient rehab are supervision with PT, supervision and min assist with OT, supervision and min assist with SLP. 8. Estimated rehab length of stay to reach the above functional goals is: 16-20 days 9. Does the patient have adequate social supports and living environment to accommodate these discharge functional goals? Yes 10. Anticipated D/C setting: Home 11. Anticipated post D/C treatments: HH therapy 12. Overall Rehab/Functional Prognosis: excellent  RECOMMENDATIONS: This patient's condition is appropriate for continued rehabilitative care in the following setting: CIR Patient  has agreed to participate in recommended program. Potentially Note that insurance prior authorization may be required for reimbursement for recommended care.  Comment: Still in neuro ICU, has cytotoxic edema requiring monitoring as well as post-TPA hemorrhage    05/10/2014  Revision History     Date/Time User Provider Type Action   05/10/2014 3:36 PM Erick Colace, MD Physician Sign   05/10/2014 11:32 AM Charlton Amor, PA-C Physician Assistant Pend   View Details Report       Routing History     Date/Time From To Method   05/10/2014 3:36 PM Erick Colace, MD Erick Colace, MD In [facility]   05/10/2014 3:36 PM Erick Colace, MD Merlene Laughter, MD Fax

## 2014-05-13 NOTE — Interval H&P Note (Signed)
[name] was admitted today to Inpatient Rehabilitation with the diagnosis of left MCA infarct.  The patient's history has been reviewed, patient examined, and there is no change in status.  Patient continues to be appropriate for intensive inpatient rehabilitation.  I have reviewed the patient's chart and labs.  Questions were answered to the patient's satisfaction.  [name] T 05/13/2014, 9:40 PM

## 2014-05-13 NOTE — Progress Notes (Signed)
UR completed.  Medicare IM (Important Message) delivered to patient today by me in anticipation of discharge.    Jacobey Gura, RN BSN MHA CCM Trauma/Neuro ICU Case Manager [phone]  

## 2014-05-13 NOTE — Progress Notes (Signed)
Speech Language Pathology Treatment: Dysphagia;Cognitive-Linquistic  Patient Details Name: [name] MRN: [id number] DOB: 04-10-1918 Today's Date: 05/13/2014 Time: 1320-1340 SLP Time Calculation (min) (ACUTE ONLY): 20 min  Assessment / Plan / Recommendation Clinical Impression  Pt seen with last part of lunch meal. Pt exhibited impulsivity and decreased awareness of right buccal cavity pocketed food requiring mod-max verbal/visual cueing (explanation, demonstration, clinical reasoning) to stop (put down fork) and check for oral residue after every 2 bites. Slightly increased awareness of right labial residue that last week. No overt s/s penetration/aspiration. SLP educated pt to place food on left side of mouth. Lung sounds clear per RN documentation, afebrile. Pt is at higher aspiration risk and will continue to need full supervision with all po's. Recommend continue Dys 2 texture, thin liquids, can administer pills whole in applesauce (vs crushed), check oral cavity for pocketed food).  Increased verbalizations in conversation noted with decreased hesitancies, false starts with increased and appropriate semantics. Expressed needs/thoughts with occasional mild cues. Followed straightforward 2 step commands with 100% accuracy. Speech intelligibility improved from 4/22. Plans for inpatient CIR today to continue communicative-cognitive-swallowing independence.   HPI HPI: 81 y/o female patient of [facility], admitted with slurred speech, right facial droop and right sided weakness. Diagnosed with left brain MCA branch infarcts s/p IV tPA, embolic secondary to known atrial fibrillation.  Patient with increased hemiparesis, dysarthria, and new onset expressive aphasia overnight 4/20. Stat CT showed left frontal and superficial hemorrhage post tPA. Patient initially passed RN stroke swallow screen on admission but noted to be having difficulty swallowing 4/21.    Pertinent Vitals Pain Assessment:  No/denies pain  SLP Plan  Continue with current plan of care    Recommendations Diet recommendations: Dysphagia 2 (fine chop);Thin liquid Liquids provided via: Straw;Cup Medication Administration: Whole meds with puree Supervision: Patient able to self feed;Full supervision/cueing for compensatory strategies Compensations: Slow rate;Small sips/bites;Check for pocketing Postural Changes and/or Swallow Maneuvers: Out of bed for meals;Seated upright 90 degrees              General recommendations: Rehab consult Oral Care Recommendations: Oral care BID Follow up Recommendations: Inpatient Rehab Plan: Continue with current plan of care    GO     Royce MacadamiaLitaker, Jerard Bays Willis 05/13/2014, 3:53 PM  Breck CoonsLisa Willis Lonell FaceLitaker M.Ed [employer]-SLP Pager [phone]

## 2014-05-13 NOTE — PMR Pre-admission (Signed)
PMR Admission Coordinator Pre-Admission Assessment  Patient: [name] is an 81 y.o., female MRN: [id number] DOB: 13-Feb-1918 Height: 5' (152.4 cm) (Per pt report) Weight: 54.7 kg (120 lb 9.5 oz)              Insurance Information HMO:     PPO:      PCP:      IPA:      80/20: yes     OTHER:  No HMO PRIMARY: Medicare a and b      Policy#: [id number] d      Subscriber: pt Benefits:  Phone #: palmetto online 05/10/14      Eff. Date: 10/18/85     Deduct: $1288      Out of Pocket Max: none      Life Max: none CIR: 100%      SNF: 20 full days Outpatient: 80%     Co-Pay: 20% Home Health: 100%      Co-Pay: none DME: 80%     Co-Pay: 20% Providers: pt choice  SECONDARY: BC[employer] of [place] supplement      Policy#: [id number]      Subscriber: pt No auth required  Medicaid Application Date:       Case Manager:  Disability Application Date:       Case Worker:   Emergency Contact Information Contact Information    Name Relation Home Work Mobile   LawndaleNudelman,Robert Son [phone]     Pedro Earlsudleman,Emily Other   [phone]   Yevonne Alineudleman,Judy Daughter   [phone]   No name specified         Current Medical History  Patient Admitting Diagnosis: Left MCA cva s/p tpa with hemorrhage  History of Present Illness:[name] is a 81 y.o. right hand female with history of hyperlipidemia, diabetes mellitus and peripheral neuropathy, chronic renal insufficiency baseline creatinine 1.36, Coronary artery disease with angioplasty, atrial fibrillation with pacemaker maintained on aspirin. She had been on Coumadin in the past but discontinued due to GI bleed. Patient is a resident of [facility]. Patient independent prior to admission. Presented 05/08/2014 with altered mental status slurred speech and right facial droop with right-sided weakness. Initial cranial CT scan showed no acute intracranial abnormality. She did receive TPA. Patient with increased right side hemiparesis as well as  expressive aphasia. Repeat CT of the head showed multiple new parenchymal and subarachnoid bleeds since prior study. Echocardiogram with ejection fraction of 55% no wall motion abnormalities without embolism. Carotid Dopplers of bilateral 40-59% ICA stenosis.Now with bilateral ICA radiographic string signs. To follow up with CVTS for possible CEA after inpatient rehabilitation stay. Maintain on a dysphagia 2 thin liquid diet. Neurology consulted and maintained on aspirin 81 mg daily.    Total: 5 NIH    Past Medical History  Past Medical History  Diagnosis Date  . Pure hypercholesterolemia   . Hyperlipidemia     Tolerating medication well as of 11/27/12  . Hypothyroidism   . Carpal tunnel syndrome   . Benign hypertensive heart disease without heart failure   . Coronary atherosclerosis of native coronary artery   . Spinal stenosis of lumbar region   . Osteopenia   . Postsurgical percutaneous transluminal coronary angioplasty status   . Essential hypertension, benign   . Benign hypertensive kidney disease with chronic kidney disease stage I through stage IV, or unspecified   . Diabetes mellitus with chronic kidney disease   . NSTEMI (non-ST elevated myocardial infarction) 09/21/09    BM stent RCA  .  Atrial fibrillation   . Tachy-brady syndrome     s/p PPM, battery change 08/01/09  . Sinoatrial node dysfunction   . Chronic anemia   . Osteoarthritis   . H/O echocardiogram 07/19/09    EF = 60-65%  . Hyperglycemia     Mild  . Microalbuminuria 02/2010  . Vitamin D deficiency 10/2011  . Chronic renal disease, stage III     Family History  family history includes Cerebral palsy in her son; Diabetes in her father; Heart disease in her father and mother; Peripheral vascular disease in her brother.  Prior Rehab/Hospitalizations: none   Current Medications   Current facility-administered medications:  .   stroke: mapping our early stages of recovery book, , Does not apply, Once, Ulice Dash, PA-C .  acetaminophen (TYLENOL) tablet 650 mg, 650 mg, Oral, Q4H PRN **OR** acetaminophen (TYLENOL) suppository 650 mg, 650 mg, Rectal, Q4H PRN, Ulice Dash, PA-C .  antiseptic oral rinse (CPC / CETYLPYRIDINIUM CHLORIDE 0.05%) solution 7 mL, 7 mL, Mouth Rinse, BID, Micki Riley, MD, 7 mL at 05/12/14 2137 .  aspirin EC tablet 81 mg, 81 mg, Oral, Daily, David L Rinehuls, PA-C, 81 mg at 05/13/14 1013 .  diltiazem (CARDIZEM CD) 24 hr capsule 120 mg, 120 mg, Oral, Daily, Micki Riley, MD, 120 mg at 05/13/14 1012 .  donepezil (ARICEPT) tablet 5 mg, 5 mg, Oral, QHS, Micki Riley, MD, 5 mg at 05/12/14 2137 .  dronedarone (MULTAQ) tablet 400 mg, 400 mg, Oral, BID WC, Ulice Dash, PA-C, 400 mg at 05/13/14 0753 .  ezetimibe (ZETIA) tablet 10 mg, 10 mg, Oral, Daily, Ulice Dash, PA-C, 10 mg at 05/13/14 1013 .  hydrALAZINE (APRESOLINE) injection 10 mg, 10 mg, Intravenous, Q8H PRN, Micki Riley, MD, 10 mg at 05/09/14 1726 .  insulin aspart (novoLOG) injection 0-9 Units, 0-9 Units, Subcutaneous, TID WC, David L Rinehuls, PA-C, 1 Units at 05/13/14 1245 .  labetalol (NORMODYNE,TRANDATE) injection 10-20 mg, 10-20 mg, Intravenous, Q10 min PRN, Micki Riley, MD, 10 mg at 05/09/14 2245 .  levothyroxine (SYNTHROID, LEVOTHROID) tablet 25 mcg, 25 mcg, Oral, QAC breakfast, Ulice Dash, PA-C, 25 mcg at 05/13/14 0747 .  pantoprazole (PROTONIX) EC tablet 40 mg, 40 mg, Oral, Daily, Micki Riley, MD, 40 mg at 05/13/14 1012 .  rosuvastatin (CRESTOR) tablet 5 mg, 5 mg, Oral, Daily, Ulice Dash, PA-C, 5 mg at 05/13/14 1012 .  senna-docusate (Senokot-S) tablet 1 tablet, 1 tablet, Oral, QHS PRN, Ulice Dash, PA-C, 1 tablet at 05/08/14 2330  Patients Current Diet: DIET DYS 2 Room service appropriate?: Yes; Fluid consistency:: Thin; Fluid restriction:: Other (see comments) Pt does not drink caffeinated beverages due to increasing heart rate issues with caffeine  Precautions /  Restrictions Precautions Precautions: Fall Restrictions Weight Bearing Restrictions: No   Prior Activity Level Community (5-7x/wk): pt does own grocery shopping weekly; goes to dining hall daily at dinner. Shops for clothing. Pt gets her own breakfast and lunch in her apartment. Does not usually cook something. Bathes and dresses self. Has pillbox system for her meds. Taken by staff weekly to get her groceries. Does her own shopping for clothes and does her own adls. Goes to dining hall by elevator nightly for dinner. Does not use AD. Does not drive. Son reports mild short term memory issues. Staff in ICU report increased confusion noted at night. Has been at [facility] for 6 years  Journalist, newspaper / Equipment  Home Assistive Devices/Equipment: CBG Meter, Eyeglasses  Prior Functional Level Prior Function Level of Independence: Independent Comments: pt did own adls without AD; does not drive, shops  Current Functional Level Cognition  Arousal/Alertness: Awake/alert Overall Cognitive Status: No family/caregiver present to determine baseline cognitive functioning (pt has some short term memory issues pta) Current Attention Level: Sustained Orientation Level: Oriented to person, Oriented to place, Oriented to time, Oriented to situation Safety/Judgement: Decreased awareness of safety, Decreased awareness of deficits Attention: Sustained Sustained Attention: Appears intact Memory:  (difficult to assess given severity of aphasia) Awareness: Impaired Awareness Impairment: Intellectual impairment (50% aware of deficits) Problem Solving: Appears intact (for basic problem solving during familiar tasks) Safety/Judgment:  (TBD)    Extremity Assessment (includes Sensation/Coordination)  Upper Extremity Assessment: Defer to OT evaluation RUE Deficits / Details: trace amount of movement for shoulder shrug, shoulder extension, shoulder flexion, and shoulder internal rotation RUE  Coordination: decreased gross motor, decreased fine motor  Lower Extremity Assessment: Generalized weakness, RLE deficits/detail RLE Deficits / Details: pt demos good functional strength, but decreased coordination.   RLE Coordination: decreased fine motor    ADLs  Overall ADL's : Needs assistance/impaired Eating/Feeding: Moderate assistance (non-dominant hand and swallowing precautions) Grooming: Moderate assistance, Bed level Upper Body Bathing: Moderate assistance, Bed level Lower Body Bathing: Total assistance, Bed level Upper Body Dressing : Total assistance, Bed level Lower Body Dressing: Total assistance, Bed level Toilet Transfer: Moderate assistance, +2 for physical assistance, Stand-pivot (bed> recliner going to pt's left) Toileting- Clothing Manipulation and Hygiene: Total assistance (with mod A +2 sit<>stand)    Mobility  Overal bed mobility: Needs Assistance Bed Mobility: Sit to Supine Supine to sit: Mod assist Sit to supine: Min assist General bed mobility comments: pt with somewhat uncontrolled return to supine, but no A needed.      Transfers  Overall transfer level: Needs assistance Equipment used: 2 person hand held assist Transfers: Sit to/from Stand, Stand Pivot Transfers Sit to Stand: Mod assist, +2 physical assistance Stand pivot transfers: Mod assist, +2 physical assistance General transfer comment: pt impulsive and attempting to reach for recliner immediately upon coming to sitting EOB.  pt needs cues for safety, attending to balance and R UE.      Ambulation / Gait / Stairs / Engineer, drilling / Balance Dynamic Sitting Balance Sitting balance - Comments: pt leans heavily topt leaning heavily to R side and no awareness of balance deficits.  When attempting to orient pt to midline, pt asks "Why are you pushing me?"  Once in midline pt having difficulty maintaining.   Balance Overall balance assessment: Needs assistance Sitting-balance  support: Single extremity supported, Feet supported Sitting balance-Leahy Scale: Zero Sitting balance - Comments: pt leans heavily topt leaning heavily to R side and no awareness of balance deficits.  When attempting to orient pt to midline, pt asks "Why are you pushing me?"  Once in midline pt having difficulty maintaining.   Postural control: Right lateral lean Standing balance support: No upper extremity supported, During functional activity Standing balance-Leahy Scale: Zero Standing balance comment: Decent strength in legs, but not in trunk or RUE    Special needs/care consideration  Bowel mgmt: continent last BM 4/24 Bladder mgmt: continent Diabetic mgmt yes Hgb A1c 7.5   Previous Home Environment Living Arrangements: Alone, Other (Comment) (ILF at [facility])  Lives With: Alone Available Help at Discharge: Other (Comment) (has different levels of care at [facility] that are availab) Type of  Home: Independent living facility Care Facility Name: [facility] Home Layout: One level Home Access: Level entry Bathroom Shower/Tub: Health visitor: Handicapped height Bathroom Accessibility: Yes How Accessible: Accessible via walker Home Care Services: No Additional Comments: has call light near bed that at 8 am daily she calls to let staff know she is ok. Bathroom has call system to contact staff if needed  Discharge Living Setting Plans for Discharge Living Setting: Other (Comment) (pt can go ILF, ALD, SNF or memory care unit at d/c depending) Type of Home at Discharge: Other (Comment) (has 4 levels of care at [facility] available as needed) Discharge Home Layout: One level Discharge Home Access: Level entry Discharge Bathroom Shower/Tub: Walk-in shower Discharge Bathroom Toilet: Handicapped height Discharge Bathroom Accessibility: Yes How Accessible: Accessible via walker Does the patient have any problems obtaining your medications?: No Molly Maduro and Irving Burton do a  pillbox for pt weekly)  Social/Family/Support Systems Patient Roles: Parent Contact Information: Renaldo Harrison and Darel Hong Anticipated Caregiver: staff at [employer] Information: see above Ability/Limitations of Caregiver: Son works as MD at [employer], Irving Burton is a Charity fundraiser unemployed at present, Darel Hong is in [employer]. Caregiver Availability: 24/7 Discharge Plan Discussed with Primary Caregiver: Yes Is Caregiver In Agreement with Plan?: Yes Does Caregiver/Family have Issues with Lodging/Transportation while Pt is in Rehab?: No  Goals/Additional Needs Patient/Family Goal for Rehab: supervision to min assist with PT, OT, and SLP Expected length of stay: ELOS 16 to 20 days Cultural Considerations: From [place], Jewish ( not Kosher) Dietary Needs: Not Kosher, decaffeinated beverages only Equipment Needs: bed alarm due to impulsive and decreased safety awareness Special Service Needs: CVTS/Dr. Tawanna Cooler Early to be consulted regarding issues of possible need for CEA at d/c from rehab Pt/Family Agrees to Admission and willing to participate: Yes Program Orientation Provided & Reviewed with Pt/Caregiver Including Roles  & Responsibilities: Yes  Decrease burden of Care through IP rehab admission: n/a  Possible need for SNF placement upon discharge: [facility] has 4 levels of care at d/c. ILF, ALF, SNF/rehab and memory care unit. Son is prepared for pt to d/c to any level depending on rehab recommendations at the time of her d/c.  Patient Condition: This patient's medical and functional status has changed since the consult dated: 05/10/2014 in which the Rehabilitation Physician determined and documented that the patient's condition is appropriate for intensive rehabilitative care in an inpatient rehabilitation facility. See "History of Present Illness" (above) for medical update. Functional changes are: mod assist. Patient's medical and functional status update has been discussed with the  Rehabilitation physician and patient remains appropriate for inpatient rehabilitation. Will admit to inpatient rehab today.  Preadmission Screen Completed By:  Clois Dupes, 05/13/2014 12:47 PM ______________________________________________________________________   Discussed status with Dr. Riley Kill on 05/13/14 at  1247 and received telephone approval for admission today.  Admission Coordinator:  Clois Dupes, time 1247 Date 05/13/2014

## 2014-05-13 NOTE — H&P (Addendum)
Physical Medicine and Rehabilitation Admission H&P    Chief Complaint  Patient presents with  . Code Stroke  : HPI: [name] is a 81 y.o. right hand female with history of hyperlipidemia, diabetes mellitus and peripheral neuropathy, chronic renal insufficiency baseline creatinine 1.36, Coronary artery disease with angioplasty, atrial fibrillation with pacemaker maintained on aspirin. She had been on Coumadin in the past but discontinued due to GI bleed. Patient is a resident of [facility]. Patient independent prior to admission. Presented 05/08/2014 with altered mental status slurred speech and right facial droop with right-sided weakness. Initial cranial CT scan showed no acute intracranial abnormality. She did receive TPA. Patient with increased right side hemiparesis as well as expressive aphasia. Repeat CT of the head showed multiple new parenchymal and subarachnoid bleeds since prior study. Echocardiogram with ejection fraction of 55% no wall motion abnormalities without embolism. Carotid Dopplers of bilateral 40-59% ICA stenosis. Maintain on a dysphagia 2 thin liquid diet. Neurology consulted and maintained on aspirin 81 mg daily. Physical and occupational therapy evaluations completed. Request made for physical medicine rehabilitation consult. Patient was admitted for a comprehensive rehabilitation program  ROS Review of Systems  Cardiovascular: Positive for palpitations.  Gastrointestinal: Positive for constipation.  Musculoskeletal: Positive for myalgias.  All other systems reviewed and are negative   Past Medical History  Diagnosis Date  . Pure hypercholesterolemia   . Hyperlipidemia     Tolerating medication well as of 11/27/12  . Hypothyroidism   . Carpal tunnel syndrome   . Benign hypertensive heart disease without heart failure   . Coronary atherosclerosis of native coronary artery   . Spinal stenosis of lumbar region   . Osteopenia   .  Postsurgical percutaneous transluminal coronary angioplasty status   . Essential hypertension, benign   . Benign hypertensive kidney disease with chronic kidney disease stage I through stage IV, or unspecified   . Diabetes mellitus with chronic kidney disease   . NSTEMI (non-ST elevated myocardial infarction) 09/21/09    BM stent RCA  . Atrial fibrillation   . Tachy-brady syndrome     s/p PPM, battery change 08/01/09  . Sinoatrial node dysfunction   . Chronic anemia   . Osteoarthritis   . H/O echocardiogram 07/19/09    EF = 60-65%  . Hyperglycemia     Mild  . Microalbuminuria 02/2010  . Vitamin D deficiency 10/2011  . Chronic renal disease, stage III    Past Surgical History  Procedure Laterality Date  . Carpal tunnel release Right 02/07/09  . Vesicovaginal fistula closure w/ tah    . Oophorectomy      With hysterectomy. One ovary removed-fibroids.  . Pacemaker insertion  2004    Gen change 08/04/09 by Dr. Leonia Reeves. New pacing generator is a Medtronic EnRhythm, model H8937337, serial [id number]  . Pacemaker generator change  08/04/09    By Dr. Leonia Reeves. New pacing generator is a Medtronic EnRhythm, model H8937337, serial [id number]  . Percutaneous coronary stent intervention (pci-s)  05/08/02    (3 tandem Cypher stent), proximal and mid LAD  . Coronary angioplasty with stent placement  09/21/09    By Dr. Leonia Reeves. ACS, NSTEMI - BM stent RCA   Family History  Problem Relation Age of Onset  . Heart disease Mother   . Heart disease Father   . Diabetes Father   . Peripheral vascular disease Brother   . Cerebral palsy Son    Social History:  reports that she has never  smoked. She does not have any smokeless tobacco history on file. She reports that she drinks alcohol. She reports that she does not use illicit drugs. Allergies:  Allergies  Allergen Reactions  . Other Other (See Comments)    Allergic to crab meat, but no shellfish allergy (can eat lobster, shrimp, etc.)  Crab meat causes  angioedema of the throat.  Marland Kitchen Penicillins   . Sulfa Antibiotics    Medications Prior to Admission  Medication Sig Dispense Refill  . Calcium Carbonate-Vitamin D (CALTRATE 600+D PO) Take 1 tablet by mouth daily.    Marland Kitchen aspirin 325 MG tablet Take 325 mg by mouth daily.    Marland Kitchen CARTIA XT 120 MG 24 hr capsule Take 120 mg by mouth.   4  . donepezil (ARICEPT) 5 MG tablet Take 5 mg by mouth at bedtime.  3  . levothyroxine (SYNTHROID) 50 MCG tablet Take 25 mcg by mouth daily before breakfast.    . MULTAQ 400 MG tablet TAKE 1 TABLET BY MOUTH TWICE DAILY 60 tablet 10  . nitroGLYCERIN (NITROSTAT) 0.4 MG SL tablet Place 0.4 mg under the tongue every 5 (five) minutes as needed for chest pain (MAX 3 TABLETS).     . rosuvastatin (CRESTOR) 5 MG tablet Take 5 mg by mouth at bedtime.     Marland Kitchen ZETIA 10 MG tablet TAKE 1 TABLET BY MOUTH ONCE DAILY (Patient taking differently: TAKE 1 TABLET BY MOUTH ONCE DAILY at bedtime.) 30 tablet 9    Home: Home Living Family/patient expects to be discharged to:: Inpatient rehab   Functional History: Prior Function Comments: Pt reports she was bathing and dressing herself PTA  Functional Status:  Mobility: Bed Mobility Overal bed mobility: Needs Assistance Bed Mobility: Supine to Sit Supine to sit: Mod assist General bed mobility comments: pt does attempt to A with mobility, but neglectful of R UE and needs cues to attend to UE's position.   Transfers Overall transfer level: Needs assistance Equipment used: 2 person hand held assist Transfers: Sit to/from Stand, Stand Pivot Transfers Sit to Stand: Mod assist, +2 physical assistance Stand pivot transfers: Mod assist, +2 physical assistance General transfer comment: pt impulsive and attempting to reach for recliner immediately upon coming to sitting EOB.  pt needs cues for safety, attending to balance and R UE.        ADL: ADL Overall ADL's : Needs assistance/impaired Eating/Feeding: Moderate assistance (non-dominant  hand and swallowing precautions) Grooming: Moderate assistance, Bed level Upper Body Bathing: Moderate assistance, Bed level Lower Body Bathing: Total assistance, Bed level Upper Body Dressing : Total assistance, Bed level Lower Body Dressing: Total assistance, Bed level Toilet Transfer: Moderate assistance, +2 for physical assistance, Stand-pivot (bed> recliner going to pt's left) Toileting- Clothing Manipulation and Hygiene: Total assistance (with mod A +2 sit<>stand)  Cognition: Cognition Overall Cognitive Status: No family/caregiver present to determine baseline cognitive functioning Arousal/Alertness: Awake/alert Orientation Level: Oriented to person, Oriented to place, Oriented to time, Disoriented to situation Attention: Sustained Sustained Attention: Appears intact Memory:  (difficult to assess given severity of aphasia) Awareness: Impaired Awareness Impairment: Intellectual impairment (50% aware of deficits) Problem Solving: Appears intact (for basic problem solving during familiar tasks) Safety/Judgment:  (TBD) Cognition Arousal/Alertness: Awake/alert Behavior During Therapy: Impulsive Overall Cognitive Status: No family/caregiver present to determine baseline cognitive functioning Area of Impairment: Orientation, Attention, Memory, Safety/judgement, Awareness, Problem solving Orientation Level: Disoriented to, Place, Time, Situation Current Attention Level: Sustained Memory: Decreased short-term memory Safety/Judgement: Decreased awareness of safety, Decreased awareness of  deficits Awareness: Intellectual Problem Solving: Slow processing, Difficulty sequencing, Requires verbal cues, Requires tactile cues  Physical Exam: Blood pressure 128/85, pulse 73, temperature 98.1 F (36.7 C), temperature source Oral, resp. rate 19, height 5' (1.524 m), weight 54.7 kg (120 lb 9.5 oz), SpO2 96 %. Physical Exam Gen: pt pleasant, alert, looks younger than stated age HENT: oral  mucosa pink, dentition good Eyes: EOM are normal.  Neck: Normal range of motion. Neck supple. No thyromegaly present.  Cardiovascular:  Cardiac rate controlled without murmur Respiratory: Effort normal and breath sounds normal. No respiratory distress.no wheezes, rales  GI: Soft. Bowel sounds are normal. She exhibits no distension. No tenderness Neurological: She is alert and oriented to place and reason she's here. Right central 7 and mild tongue deviation. She is hard of hearing bilaterally but still is able to converse without additional effort or volume. She followed all simple commands. There wasa  Occasionally delay in processing and language , but this appears much improved from prior notes in chart.  RUE: 2- deltoid, biceps, triceps, 0/5 wrist and hand. RLE: + to 3- hf, ke, and adfl/apf. Sensation appeared to be grossly intact to light touch and pain in right upper, lower, and face. Perhaps may be a subtle difference in light touch. Right tone: DTR's 3+, toes up. foot is hypersentitive toutouch. 5 minus/5 in the left deltoid, biceps, triceps, grip, hip flexor, knee extensor, ankle dorsi flexors and plantar flexion Psych: pleasant and appropriate UQJ:FHLK odor of urine   Results for orders placed or performed during the hospital encounter of 05/08/14 (from the past 48 hour(s))  Glucose, capillary     Status: Abnormal   Collection Time: 05/11/14  8:25 AM  Result Value Ref Range   Glucose-Capillary 139 (H) 70 - 99 mg/dL  Glucose, capillary     Status: Abnormal   Collection Time: 05/11/14 12:13 PM  Result Value Ref Range   Glucose-Capillary 173 (H) 70 - 99 mg/dL  Glucose, capillary     Status: Abnormal   Collection Time: 05/11/14  6:15 PM  Result Value Ref Range   Glucose-Capillary 194 (H) 70 - 99 mg/dL  Glucose, capillary     Status: Abnormal   Collection Time: 05/11/14  9:30 PM  Result Value Ref Range   Glucose-Capillary 151 (H) 70 - 99 mg/dL  Basic metabolic panel     Status:  Abnormal   Collection Time: 05/12/14  2:28 AM  Result Value Ref Range   Sodium 136 135 - 145 mmol/L   Potassium 3.7 3.5 - 5.1 mmol/L   Chloride 104 96 - 112 mmol/L   CO2 23 19 - 32 mmol/L   Glucose, Bld 165 (H) 70 - 99 mg/dL   BUN 12 6 - 23 mg/dL   Creatinine, Ser 1.03 0.50 - 1.10 mg/dL   Calcium 8.7 8.4 - 10.5 mg/dL   GFR calc non Af Amer 45 (L) >90 mL/min   GFR calc Af Amer 53 (L) >90 mL/min    Comment: (NOTE) The eGFR has been calculated using the CKD EPI equation. This calculation has not been validated in all clinical situations. eGFR's persistently <90 mL/min signify possible Chronic Kidney Disease.    Anion gap 9 5 - 15  Glucose, capillary     Status: Abnormal   Collection Time: 05/12/14  7:34 AM  Result Value Ref Range   Glucose-Capillary 163 (H) 70 - 99 mg/dL  Glucose, capillary     Status: Abnormal   Collection Time: 05/12/14 12:02 PM  Result Value Ref Range   Glucose-Capillary 124 (H) 70 - 99 mg/dL  Glucose, capillary     Status: Abnormal   Collection Time: 05/12/14  4:22 PM  Result Value Ref Range   Glucose-Capillary 112 (H) 70 - 99 mg/dL  Glucose, capillary     Status: Abnormal   Collection Time: 05/12/14  9:31 PM  Result Value Ref Range   Glucose-Capillary 172 (H) 70 - 99 mg/dL  CBC with Differential/Platelet     Status: Abnormal   Collection Time: 05/13/14  2:35 AM  Result Value Ref Range   WBC 12.0 (H) 4.0 - 10.5 K/uL   RBC 4.62 3.87 - 5.11 MIL/uL   Hemoglobin 14.1 12.0 - 15.0 g/dL   HCT 42.1 36.0 - 46.0 %   MCV 91.1 78.0 - 100.0 fL   MCH 30.5 26.0 - 34.0 pg   MCHC 33.5 30.0 - 36.0 g/dL   RDW 13.8 11.5 - 15.5 %   Platelets 229 150 - 400 K/uL   Neutrophils Relative % 72 43 - 77 %   Neutro Abs 8.7 (H) 1.7 - 7.7 K/uL   Lymphocytes Relative 16 12 - 46 %   Lymphs Abs 1.9 0.7 - 4.0 K/uL   Monocytes Relative 8 3 - 12 %   Monocytes Absolute 0.9 0.1 - 1.0 K/uL   Eosinophils Relative 4 0 - 5 %   Eosinophils Absolute 0.4 0.0 - 0.7 K/uL   Basophils Relative  0 0 - 1 %   Basophils Absolute 0.0 0.0 - 0.1 K/uL   Dg Chest Port 1 View  05/12/2014   CLINICAL DATA:  Lung opacity on prior CT  EXAM: PORTABLE CHEST - 1 VIEW  COMPARISON:  05/10/2014.  FINDINGS: Dual lead LEFT subclavian cardiac pacemaker. Cardiopericardial silhouette within normal limits. Bilateral basilar opacity, greater on the LEFT when compared to the RIGHT. Differential considerations are atelectasis, edema, aspiration. Probable small LEFT pleural effusion. No upper lobe consolidation is identified.  IMPRESSION: Bibasilar opacity with differential considerations discussed above. No upper lobe opacification is identified.   Electronically Signed   By: Dereck Ligas M.D.   On: 05/12/2014 16:49       Medical Problem List and Plan: 1. Functional deficits secondary to cardio-embolic, left MCA branch infarct with hemorrhagic transformation after TPA 2.  DVT Prophylaxis/Anticoagulation: SCDs. Monitor for any signs of DVT 3. Pain Management: Tylenol as needed---minimal pain at present 4. Mood/history of memory loss: Aricept 5 mg daily at bedtime 5. Neuropsych: This patient is capable of making decisions on her own behalf. 6. Skin/Wound Care: Routine skin checks 7. Fluids/Electrolytes/Nutrition: Strict I&O's with follow-up chemistries 8. Dysphagia/a fascia. Dysphagia 2 thin liquids. Follow per speech therapy 9. Hypertension/atrial fibrillation/pacemaker. Cardizem CD 120 mg daily,Multaq 400 mg twice a day, cardiac rate control 10.Bilat carotid Stenosis. VVS to see to help determine potential surgical needs moving forward 11. Hypothyroidism. Synthroid 12. Diabetes mellitus of peripheral neuropathy. Hemoglobin A1c 7.5. Sliding scale insulin. Check blood sugars before meals and at bedtime 13. Chronic renal insufficiency. Baseline creatinine 1.36 14. CAD/NSTEMI/angioplasty. Continue aspirin. No chest pain or shortness of breath 15. Hyperlipidemia. Zetia, Crestor 16. Low grade temp, leukocytosis:  urine with odor---check ua/cx   Post Admission Physician Evaluation: 1. Functional deficits secondary  to embolic left MCA distribution infarct with hemorrhagic transformation after TPA 2. Patient is admitted to receive collaborative, interdisciplinary care between the physiatrist, rehab nursing staff, and therapy team. 3. Patient's level of medical complexity and substantial therapy needs in context of that  medical necessity cannot be provided at a lesser intensity of care such as a SNF. 4. Patient has experienced substantial functional loss from his/her baseline which was documented above under the "Functional History" and "Functional Status" headings.  Judging by the patient's diagnosis, physical exam, and functional history, the patient has potential for functional progress which will result in measurable gains while on inpatient rehab.  These gains will be of substantial and practical use upon discharge  in facilitating mobility and self-care at the household level. 5. Physiatrist will provide 24 hour management of medical needs as well as oversight of the therapy plan/treatment and provide guidance as appropriate regarding the interaction of the two. 6. 24 hour rehab nursing will assist with bladder management, bowel management, safety, skin/wound care, disease management, medication administration, pain management and patient education  and help integrate therapy concepts, techniques,education, etc. 7. PT will assess and treat for/with: Lower extremity strength, range of motion, stamina, balance, functional mobility, safety, adaptive techniques and equipment, NMR, visual perceptual rx, stroke education , community reintegration, ego support.   Goals are: supervision to min assist. 8. OT will assess and treat for/with: ADL's, functional mobility, safety, upper extremity strength, adaptive techniques and equipment, NMR, cognitive perceptual awareness, ego support, leisure awareness .   Goals are:  supervision to min assist. Therapy may proceed with showering this patient. 9. SLP will assess and treat for/with: speech, swallowing, communication, cognition.  Goals are: min assist to mod I. 10. Case Management and Social Worker will assess and treat for psychological issues and discharge planning. 11. Team conference will be held weekly to assess progress toward goals and to determine barriers to discharge. 12. Patient will receive at least 3 hours of therapy per day at least 5 days per week. 13. ELOS: 16-22 days       14. Prognosis:  excellent     Meredith Staggers, MD, [facility] 05/13/2014   05/13/2014

## 2014-05-14 ENCOUNTER — Inpatient Hospital Stay (HOSPITAL_COMMUNITY): Payer: Medicare Other

## 2014-05-14 ENCOUNTER — Inpatient Hospital Stay (HOSPITAL_COMMUNITY): Payer: Medicare Other | Admitting: Physical Therapy

## 2014-05-14 ENCOUNTER — Inpatient Hospital Stay (HOSPITAL_COMMUNITY): Payer: Medicare Other | Admitting: Speech Pathology

## 2014-05-14 LAB — URINALYSIS, ROUTINE W REFLEX MICROSCOPIC
Bilirubin Urine: NEGATIVE
GLUCOSE, UA: NEGATIVE mg/dL
Ketones, ur: 15 mg/dL — AB
NITRITE: NEGATIVE
PH: 6 (ref 5.0–8.0)
PROTEIN: NEGATIVE mg/dL
Specific Gravity, Urine: 1.014 (ref 1.005–1.030)
UROBILINOGEN UA: 1 mg/dL (ref 0.0–1.0)

## 2014-05-14 LAB — COMPREHENSIVE METABOLIC PANEL
ALBUMIN: 3.1 g/dL — AB (ref 3.5–5.2)
ALK PHOS: 64 U/L (ref 39–117)
ALT: 20 U/L (ref 0–35)
AST: 23 U/L (ref 0–37)
Anion gap: 13 (ref 5–15)
BILIRUBIN TOTAL: 0.4 mg/dL (ref 0.3–1.2)
BUN: 32 mg/dL — AB (ref 6–23)
CHLORIDE: 101 mmol/L (ref 96–112)
CO2: 20 mmol/L (ref 19–32)
Calcium: 9 mg/dL (ref 8.4–10.5)
Creatinine, Ser: 1.74 mg/dL — ABNORMAL HIGH (ref 0.50–1.10)
GFR calc Af Amer: 28 mL/min — ABNORMAL LOW (ref >90)
GFR, EST NON AFRICAN AMERICAN: 24 mL/min — AB (ref >90)
GLUCOSE: 210 mg/dL — AB (ref 70–99)
POTASSIUM: 4.3 mmol/L (ref 3.5–5.1)
Sodium: 134 mmol/L — ABNORMAL LOW (ref 135–145)
TOTAL PROTEIN: 7 g/dL (ref 6.0–8.3)

## 2014-05-14 LAB — URINE MICROSCOPIC-ADD ON

## 2014-05-14 LAB — GLUCOSE, CAPILLARY
GLUCOSE-CAPILLARY: 141 mg/dL — AB (ref 70–99)
GLUCOSE-CAPILLARY: 172 mg/dL — AB (ref 70–99)
Glucose-Capillary: 134 mg/dL — ABNORMAL HIGH (ref 70–99)
Glucose-Capillary: 122 mg/dL — ABNORMAL HIGH (ref 70–99)

## 2014-05-14 NOTE — Progress Notes (Signed)
Patient information reviewed and entered into eRehab system by Estefanie Cornforth, RN, CRRN, PPS Coordinator.  Information including medical coding and functional independence measure will be reviewed and updated through discharge.     Per nursing patient was given "Data Collection Information Summary for Patients in Inpatient Rehabilitation Facilities with attached "Privacy Act Statement-Health Care Records" upon admission.  

## 2014-05-14 NOTE — Progress Notes (Signed)
   05/14/14 1930  What Happened  Was fall witnessed? No  Was patient injured? Yes  Patient found on floor  Found by Staff-comment Alessandra Grout(Kim Eurillo, Mosetta Pigeonchelsea Essa Malachi)  Stated prior activity ambulating-unassisted  Follow Up  MD notified Jesusita Okaan [place]   Time MD notified [phone]  Adult Fall Risk Assessment  Risk Factor Category (scoring not indicated) High fall risk per protocol (document High fall risk)  Patient's Fall Risk High Fall Risk (>13 points)  Adult Fall Risk Interventions  Required Bundle Interventions *See Row Information* High fall risk - low, moderate, and high requirements implemented  Vitals  BP (!) 142/60 mmHg  BP Location Left Arm  BP Method Automatic  Patient Position (if appropriate) Lying  Pulse Rate 82  Resp 16  Oxygen Therapy  SpO2 98 %  O2 Device Room Air  Pain Assessment  Pain Assessment 0-10  Pain Location Back  Pain Descriptors / Indicators Aching  Pain Frequency Intermittent  Pain Onset Sudden  Neurological  Neuro (WDL) X  Level of Consciousness Responds to Voice  Orientation Level Oriented X4  Cognition Follows commands  Speech Slurred/Dysarthria  Pupil Assessment  No  Neuro Symptoms None    Pt assisted back to bed and assessed for injury. Pt says " she will be all right. Just let me lie down." Skin tear to right shin found. Wound cleaned and tegaderm applied. Notified MD on call. Delle ReiningPamela Love   MD notified. Tanish Prien S

## 2014-05-14 NOTE — Evaluation (Signed)
Physical Therapy Assessment and Plan  Patient Details  Name: [name] MRN: [id number] Date of Birth: 06/17/1918  PT Diagnosis: Abnormal posture, Abnormality of gait, Cognitive deficits, Hemiplegia dominant, Impaired sensation and Muscle weakness Rehab Potential: Good ELOS: 10-14 days   Today's Date: 05/14/2014 PT Individual Time: 0800-0900 PT Individual Time Calculation (min): 60 min    Problem List:  Patient Active Problem List   Diagnosis Date Noted  . Left middle cerebral artery stroke 05/13/2014  . Right hemiparesis 05/13/2014  . Cerebral hemorrhage   . Received intravenous tissue plasminogen activator (tPA) in emergency department   . Stroke   . Cytotoxic brain edema   . CVA (cerebral infarction) 05/08/2014  . Tachy-brady syndrome   . Encounter for long-term (current) use of other medications   . CAD in native artery   . Atrial fibrillation   . Hypothyroidism   . Cardiac pacemaker in situ   . Essential hypertension, benign   . Pure hypercholesterolemia   . Sinoatrial node dysfunction     Past Medical History:  Past Medical History  Diagnosis Date  . Pure hypercholesterolemia   . Hyperlipidemia     Tolerating medication well as of 11/27/12  . Hypothyroidism   . Carpal tunnel syndrome   . Benign hypertensive heart disease without heart failure   . Coronary atherosclerosis of native coronary artery   . Spinal stenosis of lumbar region   . Osteopenia   . Postsurgical percutaneous transluminal coronary angioplasty status   . Essential hypertension, benign   . Benign hypertensive kidney disease with chronic kidney disease stage I through stage IV, or unspecified   . Diabetes mellitus with chronic kidney disease   . NSTEMI (non-ST elevated myocardial infarction) 09/21/09    BM stent RCA  . Atrial fibrillation   . Tachy-brady syndrome     s/p PPM, battery change 08/01/09  . Sinoatrial node dysfunction   . Chronic anemia   . Osteoarthritis   . H/O  echocardiogram 07/19/09    EF = 60-65%  . Hyperglycemia     Mild  . Microalbuminuria 02/2010  . Vitamin D deficiency 10/2011  . Chronic renal disease, stage III    Past Surgical History:  Past Surgical History  Procedure Laterality Date  . Carpal tunnel release Right 02/07/09  . Vesicovaginal fistula closure w/ tah    . Oophorectomy      With hysterectomy. One ovary removed-fibroids.  . Pacemaker insertion  2004    Gen change 08/04/09 by Dr. Leonia Reeves. New pacing generator is a Medtronic EnRhythm, model H8937337, serial [id number]  . Pacemaker generator change  08/04/09    By Dr. Leonia Reeves. New pacing generator is a Medtronic EnRhythm, model H8937337, serial [id number]  . Percutaneous coronary stent intervention (pci-s)  05/08/02    (3 tandem Cypher stent), proximal and mid LAD  . Coronary angioplasty with stent placement  09/21/09    By Dr. Leonia Reeves. ACS, NSTEMI - BM stent RCA    Assessment & Plan Clinical Impression: [name] is a 81 y.o. right hand female with history of hyperlipidemia, diabetes mellitus and peripheral neuropathy, chronic renal insufficiency baseline creatinine 1.36, Coronary artery disease with angioplasty, atrial fibrillation with pacemaker maintained on aspirin. She had been on Coumadin in the past but discontinued due to GI bleed. Patient is a resident of well [facility]. Patient independent prior to admission. Presented 05/08/2014 with altered mental status slurred speech and right facial droop with right-sided weakness. Initial cranial CT scan showed no acute  intracranial abnormality. She did receive TPA. Patient with increased right side hemiparesis as well as expressive aphasia. Repeat CT of the head showed multiple new parenchymal and subarachnoid bleeds since prior study. Echocardiogram with ejection fraction of 55% no wall motion abnormalities without embolism. Carotid Dopplers of bilateral 40-59% ICA stenosis. Maintain on a dysphagia 2 thin liquid  diet. Neurology consulted and maintained on aspirin 81 mg daily. Physical and occupational therapy evaluations completed. Request made for physical medicine rehabilitation consult. Patient transferred to CIR on 05/13/2014.   Patient currently requires mod with mobility secondary to muscle weakness, decreased cardiorespiratoy endurance, impaired timing and sequencing, abnormal tone and unbalanced muscle activation and decreased awareness, decreased problem solving, decreased safety awareness, decreased memory and delayed processing.  Prior to hospitalization, patient was independent  with mobility and lived with Alone in a Independent living facility home.  Home access is  Level entry.  Patient will benefit from skilled PT intervention to maximize safe functional mobility, minimize fall risk and decrease caregiver burden for planned discharge home with 24 hour supervision.  Anticipate patient will benefit from follow up [place] at discharge.  PT - End of Session Activity Tolerance: Tolerates 30+ min activity with multiple rests;Decreased this session Endurance Deficit: Yes Endurance Deficit Description: rest breaks with functional mobility PT Assessment Rehab Potential (ACUTE/IP ONLY): Good Barriers to Discharge: Decreased caregiver support (can move levels of care at ILF) PT Patient demonstrates impairments in the following area(s): Balance;Behavior;Edema;Endurance;Motor;Nutrition;Perception;Safety;Sensory PT Transfers Functional Problem(s): Bed Mobility;Bed to Chair;Car;Furniture PT Locomotion Functional Problem(s): Ambulation;Wheelchair Mobility;Stairs PT Plan PT Intensity: Minimum of 1-2 x/day ,45 to 90 minutes PT Frequency: 5 out of 7 days PT Duration Estimated Length of Stay: 10-14 days PT Treatment/Interventions: Ambulation/gait training;Balance/vestibular training;Discharge planning;Community reintegration;Disease management/prevention;DME/adaptive equipment instruction;Functional mobility  training;Neuromuscular re-education;Patient/family education;Psychosocial support;Stair training;Therapeutic Activities;Therapeutic Exercise;UE/LE Strength taining/ROM;UE/LE Coordination activities;Wheelchair propulsion/positioning PT Transfers Anticipated Outcome(s): supervision PT Locomotion Anticipated Outcome(s): supervision  PT Recommendation Follow Up Recommendations: Home health PT Patient destination: Assisted Living Equipment Recommended: To be determined  Skilled Therapeutic Intervention Skilled therapeutic intervention initiated after completion of evaluation. Discussed with patient and son falls risk, safety within room, and focus of therapy during stay. Discussed possible LOS, goals, and f/u therapy.  PT Evaluation Precautions/Restrictions Precautions Precautions: Fall Precaution Comments: pacemaker Restrictions Weight Bearing Restrictions: No General Chart Reviewed: Yes Family/Caregiver Present: Yes (son Dr. Sherwood Gambler) Vital SignsTherapy Vitals Pulse Rate: 75 BP: 135/69 mmHg Patient Position (if appropriate): Sitting (after stairs) Oxygen Therapy SpO2: 99 % O2 Device: Not Delivered Pain Pain Assessment Pain Assessment: No/denies pain Home Living/Prior Functioning Home Living Available Help at Discharge: Other (Comment) (has different levels of care at [facility] that are available) Type of Home: Independent living facility Home Access: Level entry Home Layout: One level Additional Comments: has call light near bed that at 8 am daily she calls to let staff know she is ok. Bathroom has call system to contact staff if needed  Lives With: Alone Prior Function Level of Independence: Independent with basic ADLs;Independent with transfers;Independent with gait  Able to Take Stairs?: No Driving: No Vocation: Retired Leisure: Hobbies-yes (Comment) Comments: pt did own adls without AD; does not drive, shops, likes to read the news and walk Vision/Perception  Vision -  Assessment Eye Alignment: Within Functional Limits Ocular Range of Motion: Within Functional Limits Alignment/Gaze Preference: Within Defined Limits Tracking/Visual Pursuits: Able to track stimulus in all quads without difficulty Saccades: Within functional limits Convergence: Within functional limits  Cognition Overall Cognitive Status: Impaired/Different from baseline Arousal/Alertness: Awake/alert  Attention: Sustained Sustained Attention: Appears intact Memory: Impaired Memory Impairment: Decreased recall of new information Awareness: Impaired Awareness Impairment: Intellectual impairment;Emergent impairment Problem Solving: Impaired Problem Solving Impairment: Functional basic Executive Function: Reasoning Reasoning: Impaired Behaviors: Impulsive Safety/Judgment: Impaired Sensation Sensation Light Touch: Impaired Detail Light Touch Impaired Details: Impaired RUE Hot/Cold: Impaired by gross assessment;Impaired Detail Hot/Cold Impaired Details: Impaired RUE Coordination Gross Motor Movements are Fluid and Coordinated: No Fine Motor Movements are Fluid and Coordinated: No Finger Nose Finger Test: unable to complete with RUE Motor  Motor Motor: Hemiplegia;Abnormal postural alignment and control Motor - Skilled Clinical Observations: RUE hemiplegia, forward flexed posture  Mobility Bed Mobility Bed Mobility: Sit to Supine;Supine to Sit Supine to Sit: 5: Supervision;HOB flat Supine to Sit Details (indicate cue type and reason): ADL bed Sit to Supine: 5: Supervision;HOB flat Sit to Supine - Details (indicate cue type and reason): ADL bed Locomotion  Ambulation Ambulation: Yes Ambulation/Gait Assistance: 4: Min assist;3: Mod assist Ambulation Distance (Feet): 50 Feet Assistive device: 1 person hand held assist Ambulation/Gait Assistance Details: Verbal cues for precautions/safety;Tactile cues for posture;Manual facilitation for weight shifting Gait Gait: Yes Gait  Pattern: Impaired Gait Pattern: Step-through pattern;Decreased stride length;Decreased weight shift to right;Shuffle;Trunk flexed;Narrow base of support Gait velocity: decreased Stairs / Additional Locomotion Stairs: Yes Stairs Assistance: 3: Mod assist Stairs Assistance Details: Verbal cues for technique Stair Management Technique: One rail Left;Alternating pattern;Step to pattern;Forwards (alternating ascending, step to descending) Number of Stairs: 4 Height of Stairs: 6 Ramp: Not tested (comment) Curb: Not tested (comment) Wheelchair Mobility Wheelchair Mobility: Yes Wheelchair Assistance: 4: Advertising account executive Details: Verbal cues for Marketing executive: Left upper extremity Wheelchair Parts Management: Needs assistance Distance: 150  Trunk/Postural Assessment  Cervical Assessment Cervical Assessment: Exceptions to [facility] (forward head) Thoracic Assessment Thoracic Assessment: Exceptions to [facility] (kyphotic) Lumbar Assessment Lumbar Assessment: Exceptions to [facility] (posterior pelvic tilt) Postural Control Postural Control: Deficits on evaluation Protective Responses: impaired  Balance Balance Balance Assessed: Yes Static Standing Balance Static Standing - Balance Support: During functional activity;Right upper extremity supported Static Standing - Level of Assistance: 4: Min assist Dynamic Standing Balance Dynamic Standing - Balance Support: Right upper extremity supported;During functional activity Dynamic Standing - Level of Assistance: 4: Min assist;3: Mod assist Extremity Assessment  RUE Assessment RUE Assessment: Exceptions to [facility] RUE AROM (degrees) RUE Overall AROM Comments: shoulder flexion grossly 0-40 degrees; bicep flexion grossly 0-10 degrees; no AROM at wrist or digits RUE PROM (degrees) Overall PROM Right Upper Extremity: Within functional limits for tasks performed LUE Assessment LUE Assessment: Within Functional Limits (4/5  strength) RLE Assessment RLE Assessment: Within Functional Limits (grossly 4/5) LLE Assessment LLE Assessment: Within Functional Limits (grossly 4/5)  FIM:  FIM - Bed/Chair Transfer Bed/Chair Transfer Assistive Devices: Arm rests Bed/Chair Transfer: 4: Bed > Chair or W/C: Min A (steadying Pt. > 75%);4: Chair or W/C > Bed: Min A (steadying Pt. > 75%);5: Supine > Sit: Supervision (verbal cues/safety issues);5: Sit > Supine: Supervision (verbal cues/safety issues) FIM - Locomotion: Wheelchair Distance: 150 Locomotion: Wheelchair: 4: Travels 150 ft or more: maneuvers on rugs and over door sillls with minimal assistance (Pt.>75%) FIM - Locomotion: Ambulation Locomotion: Ambulation Assistive Devices: Other (comment) (HHA) Ambulation/Gait Assistance: 4: Min assist;3: Mod assist Locomotion: Ambulation: 2: Travels 50 - 149 ft with moderate assistance (Pt: 50 - 74%) FIM - Locomotion: Stairs Locomotion: Stairs Assistive Devices: Hand rail - 1 Locomotion: Stairs: 2: Up and Down 4 - 11 stairs with moderate assistance (Pt: 50 -  74%)   Refer to Care Plan for Long Term Goals  Recommendations for other services: None  Discharge Criteria: Patient will be discharged from PT if patient refuses treatment 3 consecutive times without medical reason, if treatment goals not met, if there is a change in medical status, if patient makes no progress towards goals or if patient is discharged from hospital.  The above assessment, treatment plan, treatment alternatives and goals were discussed and mutually agreed upon: by patient and by family  Laretta Alstrom 05/14/2014, 9:49 AM

## 2014-05-14 NOTE — Evaluation (Signed)
Occupational Therapy Assessment and Plan  Patient Details  Name: [name] MRN: [id number] Date of Birth: [birth year]  OT Diagnosis: abnormal posture, cognitive deficits, hemiplegia affecting dominant side and muscle weakness (generalized) Rehab Potential: Rehab Potential (ACUTE ONLY): Excellent ELOS: 10-14 days   Today's Date: 05/14/2014 OT Individual Time: 1000-1100 and [id number] OT Individual Time Calculation (min): 60 min and 12 min      Problem List:  Patient Active Problem List   Diagnosis Date Noted  . Left middle cerebral artery stroke 05/13/2014  . Right hemiparesis 05/13/2014  . Cerebral hemorrhage   . Received intravenous tissue plasminogen activator (tPA) in emergency department   . Stroke   . Cytotoxic brain edema   . CVA (cerebral infarction) 05/08/2014  . Tachy-brady syndrome   . Encounter for long-term (current) use of other medications   . CAD in native artery   . Atrial fibrillation   . Hypothyroidism   . Cardiac pacemaker in situ   . Essential hypertension, benign   . Pure hypercholesterolemia   . Sinoatrial node dysfunction     Past Medical History:  Past Medical History  Diagnosis Date  . Pure hypercholesterolemia   . Hyperlipidemia     Tolerating medication well as of 11/27/12  . Hypothyroidism   . Carpal tunnel syndrome   . Benign hypertensive heart disease without heart failure   . Coronary atherosclerosis of native coronary artery   . Spinal stenosis of lumbar region   . Osteopenia   . Postsurgical percutaneous transluminal coronary angioplasty status   . Essential hypertension, benign   . Benign hypertensive kidney disease with chronic kidney disease stage I through stage IV, or unspecified   . Diabetes mellitus with chronic kidney disease   . NSTEMI (non-ST elevated myocardial infarction) 09/21/09    BM stent RCA  . Atrial fibrillation   . Tachy-brady syndrome     s/p PPM, battery change 08/01/09  . Sinoatrial node dysfunction   .  Chronic anemia   . Osteoarthritis   . H/O echocardiogram 07/19/09    EF = 60-65%  . Hyperglycemia     Mild  . Microalbuminuria 02/2010  . Vitamin D deficiency 10/2011  . Chronic renal disease, stage III    Past Surgical History:  Past Surgical History  Procedure Laterality Date  . Carpal tunnel release Right 02/07/09  . Vesicovaginal fistula closure w/ tah    . Oophorectomy      With hysterectomy. One ovary removed-fibroids.  . Pacemaker insertion  2004    Gen change 08/04/09 by Dr. Leonia Reeves. New pacing generator is a Medtronic EnRhythm, model H8937337, serial [id number]  . Pacemaker generator change  08/04/09    By Dr. Leonia Reeves. New pacing generator is a Medtronic EnRhythm, model H8937337, serial [id number]  . Percutaneous coronary stent intervention (pci-s)  05/08/02    (3 tandem Cypher stent), proximal and mid LAD  . Coronary angioplasty with stent placement  09/21/09    By Dr. Leonia Reeves. ACS, NSTEMI - BM stent RCA    Assessment & Plan Clinical Impression: [name] is a 81 y.o. right hand female with history of hyperlipidemia, diabetes mellitus and peripheral neuropathy, chronic renal insufficiency baseline creatinine 1.36, Coronary artery disease with angioplasty, atrial fibrillation with pacemaker maintained on aspirin. She had been on Coumadin in the past but discontinued due to GI bleed. Patient is a resident of [facility]. Patient independent prior to admission. Presented 05/08/2014 with altered mental status slurred speech and right facial droop with  right-sided weakness. Initial cranial CT scan showed no acute intracranial abnormality. She did receive TPA. Patient with increased right side hemiparesis as well as expressive aphasia. Repeat CT of the head showed multiple new parenchymal and subarachnoid bleeds since prior study. Echocardiogram with ejection fraction of 55% no wall motion abnormalities without embolism. Carotid Dopplers of bilateral 40-59% ICA  stenosis. Maintain on a dysphagia 2 thin liquid diet. Neurology consulted and maintained on aspirin 81 mg daily. Physical and occupational therapy evaluations completed. Request made for physical medicine rehabilitation consult. Patient transferred to CIR on 05/13/2014 .    Patient currently requires min A functional mobility and mod-max assist with basic self-care skills secondary to muscle weakness, decreased cardiorespiratoy endurance, abnormal tone, decreased coordination and decreased motor planning, decreased attention to right, decreased awareness, decreased safety awareness and decreased memory and decreased standing balance, decreased postural control, hemiplegia and decreased balance strategies.  Prior to hospitalization, patient could complete BADLs with modified independent .  Patient will benefit from skilled intervention to increase independence with basic self-care skills prior to discharge home to independent living facility.  Anticipate patient will require 24 hour supervision and follow-up OT at next venue of care.  OT - End of Session Activity Tolerance: Decreased this session Endurance Deficit: Yes Endurance Deficit Description: rest breaks OT Assessment Rehab Potential (ACUTE ONLY): Excellent OT Patient demonstrates impairments in the following area(s): Balance;Cognition;Perception;Safety;Sensory;Endurance;Motor OT Basic ADL's Functional Problem(s): Grooming;Bathing;Dressing;Toileting;Eating OT Transfers Functional Problem(s): Toilet;Tub/Shower OT Additional Impairment(s): Fuctional Use of Upper Extremity (R hemiplegia) OT Plan OT Intensity: Minimum of 1-2 x/day, 45 to 90 minutes OT Frequency: 5 out of 7 days OT Duration/Estimated Length of Stay: 10-14 days OT Treatment/Interventions: Balance/vestibular training;Cognitive remediation/compensation;Discharge planning;Community reintegration;DME/adaptive equipment instruction;Functional mobility training;Neuromuscular  re-education;Pain management;Psychosocial support;Patient/family education;Self Care/advanced ADL retraining;Therapeutic Activities;Therapeutic Exercise;UE/LE Strength taining/ROM;UE/LE Coordination activities;Wheelchair propulsion/positioning;Visual/perceptual remediation/compensation;Splinting/orthotics OT Self Feeding Anticipated Outcome(s): supervision OT Basic Self-Care Anticipated Outcome(s): min A dressing and supervision bathing OT Toileting Anticipated Outcome(s): supervision OT Bathroom Transfers Anticipated Outcome(s): supervision  OT Recommendation Patient destination: Assisted Living Follow Up Recommendations: Home health OT Equipment Recommended: To be determined   Skilled Therapeutic Intervention OT eval completed. Discussed role of OT, goals of therapy, possible ELOS, DME, safety plan, and fall risk. Pt ambulated room>bathroom with min HHA and pt demonstrating flexed posture. Completed bathing at shower level with therapist providing hand over hand assist to RUE for washing LUE. Pt completed dressing sit<>stand from w/c with max A overall and max cues for safety due to impulsivity. Pt required max cues throughout session for attention to RUE. Pt initiated use of RUE to comb hair, however appeared surprised when RUE unable to complete task. Pt required mod-max cues for intellectual and emergent awareness throughout therapy session. Pt left sitting in w/c with half lap tray placed and all needs in reach.  Session 2: Pt seen for 1:1 OT session with focus on functional transfers and family education. Pt received sitting EOB with family and CSW present. Pt requesting to transfer bed>recliner. Therapist provided total A to don shoes then ambulated short distance with min HHA bed>recliner. Pt demonstrating flexed posture requiring verbal cues for thoracic extension. Educated pt's family on therapy goals, activities completed in occupational therapy this AM, and possible ELOS. Pt left sitting  in recliner with family present.   OT Evaluation Precautions/Restrictions  Precautions Precautions: Fall Precaution Comments: pacemaker Restrictions Weight Bearing Restrictions: No General   Vital Signs Therapy Vitals Pulse Rate: 75 BP: 135/69 mmHg Patient Position (if appropriate): Sitting (after stairs)  Oxygen Therapy SpO2: 99 % O2 Device: Not Delivered Pain Pain Assessment Pain Assessment: No/denies pain Home Living/Prior Functioning Home Living Available Help at Discharge: Other (Comment) (has [facility] levels of care at [facility]) Type of Home: Independent living facility Home Access: Level entry Home Layout: One level Additional Comments: has call light near bed that at 8 am daily she calls to let staff know she is ok. Bathroom has call system to contact staff if needed  Lives With: Alone Prior Function Level of Independence: Independent with basic ADLs, Independent with transfers, Independent with gait  Able to Take Stairs?: No Driving: No Vocation: Retired Leisure: Hobbies-yes (Comment) Comments: pt did own adls without AD; does not drive, shops, likes to read the news and walk; reports being "active" at ILF ADL   Vision/Perception  Vision- History Baseline Vision/History: Wears glasses Wears Glasses: Reading only Patient Visual Report: No change from baseline Vision- Assessment Vision Assessment?: Yes Eye Alignment: Within Functional Limits Ocular Range of Motion: Within Functional Limits Alignment/Gaze Preference: Within Defined Limits Tracking/Visual Pursuits: Able to track stimulus in all quads without difficulty Saccades: Within functional limits Convergence: Within functional limits Visual Fields: No apparent deficits  Cognition Overall Cognitive Status: Impaired/Different from baseline Arousal/Alertness: Awake/alert Attention: Sustained Sustained Attention: Appears intact Memory: Impaired Memory Impairment: Decreased recall of new  information Awareness: Impaired Awareness Impairment: Intellectual impairment;Emergent impairment Problem Solving: Impaired Problem Solving Impairment: Functional basic Executive Function: Reasoning Reasoning: Impaired Behaviors: Impulsive Safety/Judgment: Impaired Sensation Sensation Light Touch: Impaired Detail Light Touch Impaired Details: Impaired RUE Hot/Cold: Impaired by gross assessment;Impaired Detail Hot/Cold Impaired Details: Impaired RUE Coordination Gross Motor Movements are Fluid and Coordinated: No Fine Motor Movements are Fluid and Coordinated: No Finger Nose Finger Test: unable to complete with LUE Motor  Motor Motor: Hemiplegia;Abnormal postural alignment and control Motor - Skilled Clinical Observations: RUE hemiplegia, forward flexed posture Mobility  Bed Mobility Bed Mobility: Sit to Supine;Supine to Sit Supine to Sit: 5: Supervision;HOB flat Supine to Sit Details (indicate cue type and reason): ADL bed Sit to Supine: 5: Supervision;HOB flat Sit to Supine - Details (indicate cue type and reason): ADL bed Transfers Transfers: Stand to Sit;Sit to Stand Sit to Stand: 4: Min assist Sit to Stand Details: Verbal cues for precautions/safety;Tactile cues for posture Stand to Sit: 4: Min assist Stand to Sit Details (indicate cue type and reason): Tactile cues for posture;Verbal cues for precautions/safety  Trunk/Postural Assessment  Cervical Assessment Cervical Assessment: Exceptions to [facility] (forward head) Thoracic Assessment Thoracic Assessment: Exceptions to [facility] (kyphotic) Lumbar Assessment Lumbar Assessment: Exceptions to [facility] (posterior pelvic tilt) Postural Control Postural Control: Deficits on evaluation Protective Responses: impaired  Balance Balance Balance Assessed: Yes Dynamic Sitting Balance Sitting balance - Comments: supervision dynamic sitting balance during self-care tasks Static Standing Balance Static Standing - Balance Support: During  functional activity;Right upper extremity supported Static Standing - Level of Assistance: 4: Min assist Dynamic Standing Balance Dynamic Standing - Balance Support: Right upper extremity supported;During functional activity Dynamic Standing - Level of Assistance: 4: Min assist Extremity/Trunk Assessment RUE Assessment RUE Assessment: Exceptions to [facility] RUE AROM (degrees) RUE Overall AROM Comments: shoulder flexion grossly 0-40 degrees; bicep flexion grossly 0-10 degrees; no AROM at wrist or digits RUE PROM (degrees) Overall PROM Right Upper Extremity: Within functional limits for tasks performed LUE Assessment LUE Assessment: Within Functional Limits (4/5 strength)  FIM:  FIM - Grooming Grooming Steps: Wash, rinse, dry face;Oral care, brush teeth, clean dentures Grooming: 3: Patient completes 2 of 4 or 3  of 5 steps FIM - Bathing Bathing Steps Patient Completed: Chest;Right Arm;Abdomen;Front perineal area;Right upper leg;Left upper leg;Right lower leg (including foot);Left lower leg (including foot) Bathing: 4: Min-Patient completes 8-9 [id number] parts or 75+ percent FIM - Upper Body Dressing/Undressing Upper body dressing/undressing steps patient completed: Thread/unthread left bra strap;Thread/unthread right sleeve of pullover shirt/dresss;Thread/unthread left sleeve of pullover shirt/dress Upper body dressing/undressing: 2: Max-Patient completed 25-49% of tasks FIM - Lower Body Dressing/Undressing Lower body dressing/undressing steps patient completed: Thread/unthread right underwear leg;Thread/unthread left underwear leg;Thread/unthread left pants leg Lower body dressing/undressing: 2: Max-Patient completed 25-49% of tasks FIM - Toileting Toileting steps completed by patient: Adjust clothing prior to toileting;Performs perineal hygiene;Adjust clothing after toileting Toileting: 4: Steadying assist FIM - BControl and instrumentation engineerDevices: Arm rests Bed/Chair  Transfer: 4: Bed > Chair or W/C: Min A (steadying Pt. > 75%);4: Chair or W/C > Bed: Min A (steadying Pt. > 75%);5: Supine > Sit: Supervision (verbal cues/safety issues);5: Sit > Supine: Supervision (verbal cues/safety issues) FIM - TAir cabin crewTransfers: 4-To toilet/BSC: Min A (steadying Pt. > 75%);4-From toilet/BSC: Min A (steadying Pt. > 75%) FIM - Tub/Shower Transfers Tub/Shower Assistive Devices: Walk in shower;Tub transfer bench;Grab bars Tub/shower Transfers: 4-Into Tub/Shower: Min A (steadying Pt. > 75%/lift 1 leg);4-Out of Tub/Shower: Min A (steadying Pt. > 75%/lift 1 leg)   Refer to Care Plan for Long Term Goals  Recommendations for other services: None  Discharge Criteria: Patient will be discharged from OT if patient refuses treatment 3 consecutive times without medical reason, if treatment goals not met, if there is a change in medical status, if patient makes no progress towards goals or if patient is discharged from hospital.  The above assessment, treatment plan, treatment alternatives and goals were discussed and mutually agreed upon: by patient  PDuayne Cal4/26/2016, 12:19 PM

## 2014-05-14 NOTE — Progress Notes (Signed)
[facility]ICINE [facility] RE[facility]ILITATION     PROGRESS NOTE    Subjective/Complaints: Sitting in recliner---soft waist belt. Doesn't remember me from yesterday. Knows she's in hospital with cues. Denies pain.   Objective: Vital Signs: Blood pressure 137/74, pulse 79, temperature 98 F (36.7 C), temperature source Oral, resp. rate 18, SpO2 93 %. Dg Chest Port 1 View  05/12/2014   CLINICAL DATA:  Lung opacity on prior CT  EXAM: PORTABLE CHEST - 1 VIEW  COMPARISON:  05/10/2014.  FINDINGS: Dual lead LEFT subclavian cardiac pacemaker. Cardiopericardial silhouette within normal limits. Bilateral basilar opacity, greater on the LEFT when compared to the RIGHT. Differential considerations are atelectasis, edema, aspiration. Probable small LEFT pleural effusion. No upper lobe consolidation is identified.  IMPRESSION: Bibasilar opacity with differential considerations discussed above. No upper lobe opacification is identified.   Electronically Signed   By: Andreas NewportGeoffrey  Lamke M.D.   On: 05/12/2014 16:49    Recent Labs  05/13/14 0235  WBC 12.0*  HGB 14.1  HCT 42.1  PLT 229    Recent Labs  05/12/14 0228  NA 136  K 3.7  CL 104  GLUCOSE 165*  BUN 12  CREATININE 1.03  CALCIUM 8.7   CBG (last 3)   Recent Labs  05/13/14 1629 05/13/14 2130 05/14/14 0703  GLUCAP 135* 153* 134*    Wt Readings from Last 3 Encounters:  05/13/14 54.7 kg (120 lb 9.5 oz)  10/24/13 58.06 kg (128 lb)  10/16/13 58.423 kg (128 lb 12.8 oz)    Physical Exam:  Gen: pt pleasant, alert, looks younger than stated age HENT: oral mucosa pink, dentition good Eyes: EOM are normal.  Neck: Normal range of motion. Neck supple. No thyromegaly present.  Cardiovascular:  Cardiac rate controlled without murmur Respiratory: Effort normal and breath sounds normal. No respiratory distress.no wheezes, rales  GI: Soft. Bowel sounds are normal. She exhibits no distension. No tenderness Neurological: She is alert and  oriented to place and reason she's here. Right central 7 and mild tongue deviation. She is hard of hearing bilaterally but still is able to converse without additional effort or volume. She followed all simple commands. There wasa Occasionally delay in processing and language , but this appears much improved from prior notes in chart. RUE: 2- deltoid, biceps, triceps, 0/5 wrist and hand. RLE: + to 3- hf, ke, and adfl/apf. Sensation appeared to be grossly intact to light touch and pain in right upper, lower, and face.   Right tone: DTR's 3+, toes up. foot is hypersentitive toutouch. 5 minus/5 in the left deltoid, biceps, triceps, grip, hip flexor, knee extensor, ankle dorsi flexors and plantar flexion Psych: pleasant, confused, redirectable  Uro: urine with odor  Assessment/Plan: 1. 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 Physiatrist is providing close team supervision and 24 hour management of active medical problems listed below. Physiatrist and rehab team continue to assess barriers to discharge/monitor patient progress toward functional and medical goals. FIM:                   Comprehension Comprehension Mode: Auditory Comprehension: 5-Follows basic conversation/direction: With no assist  Expression Expression Mode: Verbal Expression: 5-Expresses basic needs/ideas: With extra time/assistive device  Social Interaction Social Interaction: 4-Interacts appropriately 75 - 89% of the time - Needs redirection for appropriate language or to initiate interaction.  Problem Solving Problem Solving: 4-Solves basic 75 -  89% of the time/requires cueing 10 - 24% of the time  Memory Memory: 4-Recognizes or recalls 75 - 89% of the time/requires cueing 10 - 24% of the time  Medical Problem List and Plan: 1. Functional deficits secondary to  cardio-embolic, left MCA branch infarct with hemorrhagic transformation after TPA 2. DVT Prophylaxis/Anticoagulation: SCDs. Monitor for any signs of DVT 3. Pain Management: Tylenol as needed---minimal pain at present 4. Mood/history of memory loss/mild dementia: Aricept 5 mg daily at bedtime  -appears to be having some sundowning in hospital  -exacerbated by cva and ?UTI 5. Neuropsych: This patient is capable of making decisions on her own behalf. 6. Skin/Wound Care: Routine skin checks 7. Fluids/Electrolytes/Nutrition:   follow-up chemistries 8. Dysphagia/a fascia. Dysphagia 2 thin liquids. Follow per speech therapy 9. Hypertension/atrial fibrillation/pacemaker. Cardizem CD 120 mg daily,Multaq 400 mg twice a day, cardiac rate control 10.Bilat carotid Stenosis. VVS to see to help determine potential surgical needs moving forward 11. Hypothyroidism. Synthroid 12. Diabetes mellitus of peripheral neuropathy. Hemoglobin A1c 7.5. Sliding scale insulin. Check blood sugars before meals and at bedtime 13. Chronic renal insufficiency. Baseline creatinine 1.36 14. CAD/NSTEMI/angioplasty. Continue aspirin. No chest pain or shortness of breath 15. Hyperlipidemia. Zetia, Crestor 16. Low grade temp, leukocytosis: urine with odor---ua equivocal, ucx pending LOS (Days) 1 A FACE TO FACE EVALUATION WAS PERFORMED  SWARTZ,ZACHARY T 05/14/2014 8:07 AM

## 2014-05-14 NOTE — Progress Notes (Signed)
Occupational Therapy Note  Patient Details  Name: [name] MRN: [id number] Date of Birth: 1918/12/29  Today's Date: 05/14/2014 OT Individual Time: 1330-1400 OT Individual Time Calculation (min): 30 min   Pt c/o pain in right wrist; unrated; RN aware and repositioned. Individual Therapy  Pt sitting in w/c upon arrival and agreeable to participating in therapy.  Pt transitioned to therapy gym and engaged in RUE NMR including weight bearing through RUE while reaching across midline with LUE.  Pt exhibited increased shoulder flexion/extension and adduction/abduction.  No elbow flexion/extension noted.  Pt performed stand pivot transfers with steady A and max verbal cues for safety.     [name], [name] [facility] LPChappell 05/14/2014, 2:09 PM

## 2014-05-14 NOTE — Evaluation (Signed)
Speech Language Pathology Assessment and Plan  Patient Details  Name: [name] MRN: [id number] Date of Birth: [birth year]  SLP Diagnosis: Aphasia;Cognitive Impairments;Dysphagia;Dysarthria  Rehab Potential: Excellent ELOS: 10-14 days     Today's Date: 05/14/2014 SLP Individual Time: 1105-1200 SLP Individual Time Calculation (min): 55 min   Problem List:  Patient Active Problem List   Diagnosis Date Noted  . Left middle cerebral artery stroke 05/13/2014  . Right hemiparesis 05/13/2014  . Cerebral hemorrhage   . Received intravenous tissue plasminogen activator (tPA) in emergency department   . Stroke   . Cytotoxic brain edema   . CVA (cerebral infarction) 05/08/2014  . Tachy-brady syndrome   . Encounter for long-term (current) use of other medications   . CAD in native artery   . Atrial fibrillation   . Hypothyroidism   . Cardiac pacemaker in situ   . Essential hypertension, benign   . Pure hypercholesterolemia   . Sinoatrial node dysfunction    Past Medical History:  Past Medical History  Diagnosis Date  . Pure hypercholesterolemia   . Hyperlipidemia     Tolerating medication well as of 11/27/12  . Hypothyroidism   . Carpal tunnel syndrome   . Benign hypertensive heart disease without heart failure   . Coronary atherosclerosis of native coronary artery   . Spinal stenosis of lumbar region   . Osteopenia   . Postsurgical percutaneous transluminal coronary angioplasty status   . Essential hypertension, benign   . Benign hypertensive kidney disease with chronic kidney disease stage I through stage IV, or unspecified   . Diabetes mellitus with chronic kidney disease   . NSTEMI (non-ST elevated myocardial infarction) 09/21/09    BM stent RCA  . Atrial fibrillation   . Tachy-brady syndrome     s/p PPM, battery change 08/01/09  . Sinoatrial node dysfunction   . Chronic anemia   . Osteoarthritis   . H/O echocardiogram 07/19/09    EF = 60-65%  . Hyperglycemia      Mild  . Microalbuminuria 02/2010  . Vitamin D deficiency 10/2011  . Chronic renal disease, stage III    Past Surgical History:  Past Surgical History  Procedure Laterality Date  . Carpal tunnel release Right 02/07/09  . Vesicovaginal fistula closure w/ tah    . Oophorectomy      With hysterectomy. One ovary removed-fibroids.  . Pacemaker insertion  2004    Gen change 08/04/09 by Dr. Leonia Reeves. New pacing generator is a Medtronic EnRhythm, model H8937337, serial [id number]  . Pacemaker generator change  08/04/09    By Dr. Leonia Reeves. New pacing generator is a Medtronic EnRhythm, model H8937337, serial [id number]  . Percutaneous coronary stent intervention (pci-s)  05/08/02    (3 tandem Cypher stent), proximal and mid LAD  . Coronary angioplasty with stent placement  09/21/09    By Dr. Leonia Reeves. ACS, NSTEMI - BM stent RCA    Assessment / Plan / Recommendation Clinical Impression Patient is a 81 y.o. right handed female with history of hyperlipidemia, diabetes mellitus and peripheral neuropathy, chronic renal insufficiency baseline creatinine 1.36, Coronary artery disease with angioplasty, atrial fibrillation with pacemaker maintained on aspirin. She had been on Coumadin in the past but discontinued due to GI bleed. [facility] a resident of [facility]. Patient independent prior to admission. Patient presented 05/08/2014 with altered mental status slurred speech and right facial droop with right-sided weakness. Initial cranial CT scan showed no acute intracranial abnormality. She did receive TPA. Patient with  increased right side hemiparesis as well as expressive aphasia. Repeat CT of the head showed multiple new parenchymal and subarachnoid bleeds since prior study. Echocardiogram with ejection fraction of 55% no wall motion abnormalities without embolism. Carotid Dopplers of bilateral 40-59% ICA stenosis. Maintain on a dysphagia 2 textures with thin liquids. Neurology consulted and  maintained on aspirin 81 mg daily. Physical and occupational therapy evaluations completed. Request made for physical medicine rehabilitation consult. Patient was admitted for a comprehensive rehabilitation program on 05/13/14 and was administered a cognitive-linguistic evaluation and minimal BSE. Patient presents with moderate cognitive impairments impacting attention, working memory, functional problem solving, intellectual awareness of deficits and safety awareness. Patient also demonstrates a mild-moderate expressive aphasia characterized by decreased word-finding. Patient has moderate right lingual and labial weakness and ROM resulting in mild dysarthria due to imprecise consonants. Patient also presents with a moderate oral phase dysphagia characterized by right anterior labial spillage, delayed oral transit, and mild post swallow residuals with puree textures. Solid textures were not attempted due to patient refusal. Patient also consumed thin liquids via straw with adequate lip closure and what appeared to be a timely swallow initiation without overt s/s of aspiration, therefore, recommend patient continue current diet of Dys. 2 textures with thin liquids with full supervision to maximize her overall safety. Patient would benefit from skilled SLP intervention to maximize her cognitive-linguistic function, speech intelligibility and swallowing function in order to maximize her overall functional independence.   Skilled Therapeutic Interventions          Administered a cognitive-linguistic evaluation and BSE. Please see above for details.   SLP Assessment  Patient will need skilled Speech Lanaguage Pathology Services during CIR admission    Recommendations  Diet Recommendations: Dysphagia 2 (Fine chop);Thin liquid Liquid Administration via: Cup;Straw Medication Administration: Whole meds with puree Supervision: Patient able to self feed;Full supervision/cueing for compensatory  strategies Compensations: Slow rate;Small sips/bites;Check for pocketing Postural Changes and/or Swallow Maneuvers: Out of bed for meals;Seated upright 90 degrees Oral Care Recommendations: Oral care BID Patient destination: Assisted Living Follow up Recommendations: 24 hour supervision/assistance (ALF) Equipment Recommended: None recommended by SLP    SLP Frequency 3 to 5 out of 7 days   SLP Treatment/Interventions Cognitive remediation/compensation;Cueing hierarchy;Dysphagia/aspiration precaution training;Environmental controls;Functional tasks;Internal/external aids;Oral motor exercises;Patient/family education;Speech/Language facilitation;Therapeutic Activities    Pain No/Denies Pain  Short Term Goals: Week 1: SLP Short Term Goal 1 (Week 1): Patient will consume current diet with minimal overt s/s of aspiration with Min A multimodal cues for use of swallowing compensatory straegies. SLP Short Term Goal 2 (Week 1): Patient will demonstrate efficient mastication of Dys. 3 textures evidenced by minimal oral residue without overt s/s of aspiration with Min A multimodal cues.  SLP Short Term Goal 3 (Week 1): Patient will utilize external memory aids to recall new, daily information with Min A multimodal cues.  SLP Short Term Goal 4 (Week 1): Patient will identify 2 cognitive and 2 physical changes with Mod A multimodal cues.  SLP Short Term Goal 5 (Week 1): Patient will utilize word-finding strategies at the phrase level with Mod A multimodal cues.  SLP Short Term Goal 6 (Week 1): Patient will utilize speech intelligibility strategies at the sentence level with supervision verbal cues.   See FIM for current functional status Refer to Care Plan for Long Term Goals  Recommendations for other services: None  Discharge Criteria: Patient will be discharged from SLP if patient refuses treatment 3 consecutive times without medical reason, if treatment goals  not met, if there is a change in medical  status, if patient makes no progress towards goals or if patient is discharged from hospital.  The above assessment, treatment plan, treatment alternatives and goals were discussed and mutually agreed upon: by patient  Shene Maxfield 05/14/2014, 4:08 PM

## 2014-05-15 ENCOUNTER — Inpatient Hospital Stay (HOSPITAL_COMMUNITY): Payer: Medicare Other

## 2014-05-15 ENCOUNTER — Inpatient Hospital Stay (HOSPITAL_COMMUNITY): Payer: Medicare Other | Admitting: Speech Pathology

## 2014-05-15 ENCOUNTER — Inpatient Hospital Stay (HOSPITAL_COMMUNITY): Payer: Medicare Other | Admitting: Physical Therapy

## 2014-05-15 LAB — BASIC METABOLIC PANEL
ANION GAP: 10 (ref 5–15)
BUN: 26 mg/dL — AB (ref 6–23)
CHLORIDE: 104 mmol/L (ref 96–112)
CO2: 22 mmol/L (ref 19–32)
Calcium: 9.1 mg/dL (ref 8.4–10.5)
Creatinine, Ser: 1.45 mg/dL — ABNORMAL HIGH (ref 0.50–1.10)
GFR calc Af Amer: 35 mL/min — ABNORMAL LOW (ref >90)
GFR calc non Af Amer: 30 mL/min — ABNORMAL LOW (ref >90)
Glucose, Bld: 194 mg/dL — ABNORMAL HIGH (ref 70–99)
Potassium: 4.5 mmol/L (ref 3.5–5.1)
Sodium: 136 mmol/L (ref 135–145)

## 2014-05-15 LAB — GLUCOSE, CAPILLARY
GLUCOSE-CAPILLARY: 153 mg/dL — AB (ref 70–99)
Glucose-Capillary: 162 mg/dL — ABNORMAL HIGH (ref 70–99)
Glucose-Capillary: 132 mg/dL — ABNORMAL HIGH (ref 70–99)
Glucose-Capillary: 134 mg/dL — ABNORMAL HIGH (ref 70–99)

## 2014-05-15 LAB — URINE CULTURE

## 2014-05-15 MED ORDER — SODIUM CHLORIDE 0.45 % IV SOLN
INTRAVENOUS | Status: DC
  Administered 2014-05-15: 50 mL/h via INTRAVENOUS

## 2014-05-15 MED ORDER — INSULIN ASPART 100 UNIT/ML ~~LOC~~ SOLN: 0.0000 [IU] | Freq: Three times a day (TID) | SUBCUTANEOUS | Status: DC

## 2014-05-15 MED ORDER — SODIUM CHLORIDE 0.45 % IV SOLN: INTRAVENOUS | Status: DC

## 2014-05-15 NOTE — Progress Notes (Signed)
Physical Therapy Session Note  Patient Details  Name: [name] MRN: [id number] Date of Birth: [birth year]  Today's Date: 05/15/2014 PT Individual Time: 1300-1400 PT Individual Time Calculation (min): 60 min   Short Term Goals: Week 1:  PT Short Term Goal 1 (Week 1): = LTGs of overall supervision  Skilled Therapeutic Interventions/Progress Updates:   Patient received semi reclined in bed with family present. Patient transferred supine > sit with min A and reported dizziness. Vitals assessed and WFL. Patient with continued c/o dizziness but agreeable to toileting. Began ambulating from bed with severe lateral lean and decreased balance compared to yesterday, therefore returned to sitting edge of bed due to safety concerns. Patient's family reporting patient with worsening dysarthric speech compared to yesterday. Due to c/o increased dysarthria, dizziness, and history of falling last evening, PA notified and ordered TED hose and imaging. Patient transferred w/c <> toilet with min A and max cues for safe sequencing. Patient performed hygiene with steady assist and required assist for clothing management. Patient washed hands at sink from wheelchair and propelled wheelchair room > gym using RUE only with min A. In gym, performed supine NMR including bridging with 3 sec hold and SLR with cues for control and speed of movement. Attempted standing using RW with R hand orthosis but patient with c/o increased dizziness. Patient returned to bed with min A and transferred sit > supine with supervision. Patient repositioned in bed via bridging and left semi reclined with RUE elevated on pillow and call bell within reach, RN notified of patient position.   Therapy Documentation Precautions:  Precautions Precautions: Fall Precaution Comments: pacemaker Restrictions Weight Bearing Restrictions: No Vital Signs: Therapy Vitals Temp: 97.8 F (36.6 C) Temp Source: Oral Pulse Rate: 75 Resp: 16 BP:  137/61 mmHg Patient Position (if appropriate): Sitting Oxygen Therapy SpO2: 98 % O2 Device: Not Delivered Pain: Pain Assessment Pain Assessment: No/denies pain  See FIM for current functional status  Therapy/Group: Individual Therapy  Kerney ElbeVarner, Jamesha Ellsworth A 05/15/2014, 2:28 PM

## 2014-05-15 NOTE — Progress Notes (Signed)
Social Work  Social Work Assessment and Plan  Patient Details  Name: [name] MRN: [id number] Date of Birth: 02-Jul-1918  Today's Date: 05/15/2014  Problem List:  Patient Active Problem List   Diagnosis Date Noted  . Left middle cerebral artery stroke 05/13/2014  . Right hemiparesis 05/13/2014  . Cerebral hemorrhage   . Received intravenous tissue plasminogen activator (tPA) in emergency department   . Stroke   . Cytotoxic brain edema   . CVA (cerebral infarction) 05/08/2014  . Tachy-brady syndrome   . Encounter for long-term (current) use of other medications   . CAD in native artery   . Atrial fibrillation   . Hypothyroidism   . Cardiac pacemaker in situ   . Essential hypertension, benign   . Pure hypercholesterolemia   . Sinoatrial node dysfunction    Past Medical History:  Past Medical History  Diagnosis Date  . Pure hypercholesterolemia   . Hyperlipidemia     Tolerating medication well as of 11/27/12  . Hypothyroidism   . Carpal tunnel syndrome   . Benign hypertensive heart disease without heart failure   . Coronary atherosclerosis of native coronary artery   . Spinal stenosis of lumbar region   . Osteopenia   . Postsurgical percutaneous transluminal coronary angioplasty status   . Essential hypertension, benign   . Benign hypertensive kidney disease with chronic kidney disease stage I through stage IV, or unspecified   . Diabetes mellitus with chronic kidney disease   . NSTEMI (non-ST elevated myocardial infarction) 09/21/09    BM stent RCA  . Atrial fibrillation   . Tachy-brady syndrome     s/p PPM, battery change 08/01/09  . Sinoatrial node dysfunction   . Chronic anemia   . Osteoarthritis   . H/O echocardiogram 07/19/09    EF = 60-65%  . Hyperglycemia     Mild  . Microalbuminuria 02/2010  . Vitamin D deficiency 10/2011  . Chronic renal disease, stage III    Past Surgical History:  Past Surgical History  Procedure Laterality Date  . Carpal tunnel  release Right 02/07/09  . Vesicovaginal fistula closure w/ tah    . Oophorectomy      With hysterectomy. One ovary removed-fibroids.  . Pacemaker insertion  2004    Gen change 08/04/09 by Dr. Amil AmenEdmunds. New pacing generator is a Medtronic EnRhythm, model D1939726#[id number], serial [id number] H  . Pacemaker generator change  08/04/09    By Dr. Amil AmenEdmunds. New pacing generator is a Medtronic EnRhythm, model D1939726#[id number], serial [id number]  . Percutaneous coronary stent intervention (pci-s)  05/08/02    (3 tandem Cypher stent), proximal and mid LAD  . Coronary angioplasty with stent placement  09/21/09    By Dr. Amil AmenEdmunds. ACS, NSTEMI - BM stent RCA   Social History:  reports that she has never smoked. She does not have any smokeless tobacco history on file. She reports that she drinks alcohol. She reports that she does not use illicit drugs.  Family / Support Systems Marital Status: Widow/Widower Patient Roles: Parent Children: son, Shirlean KellyRobert Carbin @ [phone](C) [phone] and daughter-in-law, Irving Burtonmily, @ (C) [phone] [facility]([place]);  daughter, Rolley SimsJudy Michie @ [phone](C) [phone] Lane[facility] Pastor([place]) Other Supports: staff @ [facility] Anticipated Caregiver: staff at [facility] Ability/Limitations of Caregiver: Son works as MD at [employer], Irving Burtonmily is a Charity fundraiserN unemployed at present, Darel HongJudy is in [employer]. Caregiver Availability: 24/7 Family Dynamics: Family very attentive and supportive.  Here daily and ready to assist with any planning needed.  Social History Preferred language: English Religion:  Jewish Cultural Background: NA Read: Yes Write: Yes Employment Status: Retired Fish farm manager Issues: None Guardian/Conservator: None - per MD, pt not capable of making decisions on her own behalf - defer to son   Abuse/Neglect Physical Abuse: Denies Verbal Abuse: Denies Sexual Abuse: Denies Exploitation of patient/patient's resources: Denies Self-Neglect: Denies  Emotional Status Pt's affect, behavior adn adjustment  status: Pt pleasant and attempts to complete interview, however, dtr-in-law and dtr offer corrections as needed.   Pt with slight slurred speech.  She admits frustration with her limitations.  No s/s significant emotional distress noted/ reported. Will monitor and refer for neuropsych when appropriate.   Recent Psychosocial Issues: None Pyschiatric History: None Substance Abuse History: None  Patient / Family Perceptions, Expectations & Goals Pt/Family understanding of illness & functional limitations: Pt with basic understanding of her deficits now due to stroke and need for CIR.   Family with very good understanding of stroke and functional limitations. Premorbid pt/family roles/activities: Pt was living in independent apartment at [facility].  Completely independent. Anticipated changes in roles/activities/participation: Pt will likley need supervision and possibly some physical assist at d/c which may mean a need to change LOC at [facility]. Pt/family expectations/goals: Pt hopeful she can return to her apartment but aware not likely immediately upon d/c.  Community Resources [employer]: Other (Comment) ([facility]) Premorbid Home Care/DME Agencies: None Transportation available at discharge: yes Resource referrals recommended: Neuropsychology  Discharge Planning Living Arrangements: Alone (ILF @ [facility]) Support Systems: Children, Home care staff Type of Residence: Other (Comment) (ILF) Insurance Resources: Armed forces operational officer, Media planner (specify) Herbalist) Financial Resources: Restaurant manager, fast food Screen Referred: No Living Expenses: Psychologist, sport and exercise Management: Family Does the patient have any problems obtaining your medications?: No Home Management: pt was independent with this PTA Patient/Family Preliminary Plans: Pt aware that likely she will return to [facility] via SNF/ Rehab level intially Social Work Anticipated Follow Up Needs: SNF Expected length  of stay: 10-14 days  Clinical Impression Elderly woman here following a CVA.  A resident in the [place] apartments at [facility] with plans to return but likely through the SNF/ Rehab level initially.  Excellent family support with son a MD in the community.  Denies any s/s of emotional distress but will monitor.  Will assist with support and transition back to [facility].  Lyndee Herbst 05/15/2014, 2:09 PM

## 2014-05-15 NOTE — Patient Care Conference (Signed)
Inpatient RehabilitationTeam Conference and Plan of Care Update Date: 05/14/2014   Time: 2:55 PM    Patient Name: [name]      Medical Record Number: [id number]  Date of Birth: 1918-12-11 Sex: Female         Room/Bed: 4W12C/4W12C-01 Payor Info: Payor: MEDICARE / Plan: MEDICARE PART A AND B / Product Type: *No Product type* /    Admitting Diagnosis: CVA  Admit Date/Time:  05/13/2014  4:15 PM Admission Comments: No comment available   Primary Diagnosis:  Left middle cerebral artery stroke Principal Problem: Left middle cerebral artery stroke  Patient Active Problem List   Diagnosis Date Noted  . Left middle cerebral artery stroke 05/13/2014  . Right hemiparesis 05/13/2014  . Cerebral hemorrhage   . Received intravenous tissue plasminogen activator (tPA) in emergency department   . Stroke   . Cytotoxic brain edema   . CVA (cerebral infarction) 05/08/2014  . Tachy-brady syndrome   . Encounter for long-term (current) use of other medications   . CAD in native artery   . Atrial fibrillation   . Hypothyroidism   . Cardiac pacemaker in situ   . Essential hypertension, benign   . Pure hypercholesterolemia   . Sinoatrial node dysfunction     Expected Discharge Date: Expected Discharge Date: 05/27/14  Team Members Present: Physician leading conference: Dr. Faith Rogue Social Worker Present: Amada Jupiter, LCSW Nurse Present: Chana Bode, RN PT Present: Bayard Hugger, PT OT Present: Ardis Rowan, COTA;Jennifer Fredrich Romans, OT SLP Present: Feliberto Gottron, SLP Other (Discipline and Name): Ottie Glazier, RN [facility]) PPS Coordinator present : Tora Duck, RN, CRRN     Current Status/Progress Goal Weekly Team Focus  Medical   left mca infarct with hemorrhage, baseline dementia,   improve language, functional mobility  improve cognition, sleep   Bowel/Bladder   Pt continent of bowel and bladder. LBM 05/12/14  Pt to remain continent of bowel and bladder  Monitor    Swallow/Nutrition/ Hydration   Dys. 2 textures with thin liquids, Mod A for use of swallow strategies  Supervision  trials of upgraded textures, utilization of swallowing compensatory strategies    ADL's   min A functional transfers, mod-max A self-care tasks  supervision overall with min A dressing  R NM, functional transfers, awareness, safety, standing balance, strengthening, coordination, family education   Mobility   min-mod A up to 50 ft with R HHA  supervision overall  NMR, functional mobility training, balance, safety, activity tolerance, pt/family education   Communication   Min-Mod A  Supervision  utilization of speech intelligibility strategies, word-finding strategies   Safety/Cognition/ Behavioral Observations  Mod A  Supervision  intellectual awareness, working memory, functional problem solving    Pain   No c/o pain  <3  Monitor for nonverbal cues of pain   Skin   Bruising to LUE, wrist with mild edema, L heel pink, blanchable-Allevyn dressing to area  No additional skin breakdown  Monitor q shift. Encourage to turn q 2hrs    Rehab Goals Patient on target to meet rehab goals: Yes *See Care Plan and progress notes for long and short-term goals.  Barriers to Discharge: baseline cognitive deficits, apraxia, awareness    Possible Resolutions to Barriers:  ongoing language and functional based therapy    Discharge Planning/Teaching Needs:  return to [facility] with LOC to be determined      Team Discussion:  New eval. Some cognitive decline since CVA.  UTI?  Does not seem to  have much awareness or appreciation of her deficits and is a safety risk for falls.  Currently on D2.  Plan to return to [facility] upon d/c and team feels likely most appropriate to return through the SNF/ Rehab level of care.  Revisions to Treatment Plan:  None   Continued Need for Acute Rehabilitation Level of Care: The patient requires daily medical management by a physician with  specialized training in physical medicine and rehabilitation for the following conditions: Daily direction of a multidisciplinary physical rehabilitation program to ensure safe treatment while eliciting the highest outcome that is of practical value to the patient.: Yes Daily medical management of patient stability for increased activity during participation in an intensive rehabilitation regime.: Yes Daily analysis of laboratory values and/or radiology reports with any subsequent need for medication adjustment of medical intervention for : Neurological problems;Other  [name] 05/15/2014, 2:11 PM

## 2014-05-15 NOTE — Progress Notes (Signed)
Speech Language Pathology Daily Session Notes  Patient Details  Name: [name] MRN: [id number] Date of Birth: 1918/11/09  Today's Date: 05/15/2014  Session 1: SLP Individual Time: 0900-1000 SLP Individual Time Calculation (min): 60 min   Session 2:SLP Individual Time: [id number] SLP Individual Time Calculation (min): 15 Missed Time Calculation (min): 15: off unit for procedure   Short Term Goals: Week 1: SLP Short Term Goal 1 (Week 1): Patient will consume current diet with minimal overt s/s of aspiration with Min A multimodal cues for use of swallowing compensatory straegies. SLP Short Term Goal 2 (Week 1): Patient will demonstrate efficient mastication of Dys. 3 textures evidenced by minimal oral residue without overt s/s of aspiration with Min A multimodal cues.  SLP Short Term Goal 3 (Week 1): Patient will utilize external memory aids to recall new, daily information with Min A multimodal cues.  SLP Short Term Goal 4 (Week 1): Patient will identify 2 cognitive and 2 physical changes with Mod A multimodal cues.  SLP Short Term Goal 5 (Week 1): Patient will utilize word-finding strategies at the phrase level with Mod A multimodal cues.  SLP Short Term Goal 6 (Week 1): Patient will utilize speech intelligibility strategies at the sentence level with supervision verbal cues.   Skilled Therapeutic Interventions:  Session 1: Skilled treatment session focused on speech and language goals. Upon arrival, patient was awake and alert while sitting upright in her wheelchair and agreeable to participate in treatment session.  SLP facilitated session by providing Mod A verbal and question cues for problem solving during a 4 and 6 step picture sequencing task. Patient also required Min A semantic and phonemic cues for word-finding at the phrase level during a verbal description task and Mas A phonemic and semantic cueing during generative naming tasks. Patient was 90% intelligible at the phrase  level and independently utilized an increased vocal intensity to increased intelligibility during communication breakdown. Patient transferred back to bed at end of session with bed alarm on and all needs within reach. Continue with current plan of care.  Session 2:   Skilled treatment session focused on cognitive and speech goals. Upon arrival, patient was awake while supine in bed awaiting transport to CT. Patient initially appeared to be less intelligible, however, as the session continued and patient's arousal improved and her overall intelligibility improved to ~90% at the phrase level. Patient required Max A multimodal cues to recall events from morning therapy sessions as well as for recall after 5 minute delay in regards to where she was going with transport. Session ended 15 minutes early due to transport. Continue with current plan of care.   FIM:  Comprehension Comprehension Mode: Auditory Comprehension: 4-Understands basic 75 - 89% of the time/requires cueing 10 - 24% of the time Expression Expression Mode: Verbal Expression: 3-Expresses basic 50 - 74% of the time/requires cueing 25 - 50% of the time. Needs to repeat parts of sentences. Social Interaction Social Interaction: 4-Interacts appropriately 75 - 89% of the time - Needs redirection for appropriate language or to initiate interaction. Problem Solving Problem Solving: 3-Solves basic 50 - 74% of the time/requires cueing 25 - 49% of the time Memory Memory: 2-Recognizes or recalls 25 - 49% of the time/requires cueing 51 - 75% of the time  Pain Pain Assessment Pain Assessment: No/denies pain  Therapy/Group: Individual Therapy  Farida Mcreynolds 05/15/2014, 3:36 PM

## 2014-05-15 NOTE — Progress Notes (Signed)
[facility] PHYSICAL MEDICINE & REHABILITATION     PROGRESS NOTE    Subjective/Complaints: Had a fall early last evening when trying to get out of bed. Small right shin tear. Pt seems to vaguely recall. Denies any problems this morning. No n/v/d/cp/sob. No back/leg pain.  Objective: Vital Signs: Blood pressure 137/59, pulse 64, temperature 98.1 F (36.7 C), temperature source Oral, resp. rate 16, SpO2 95 %. No results found.  Recent Labs  05/13/14 0235 05/14/14 1420  WBC 12.0* 10.6*  HGB 14.1 14.6  HCT 42.1 43.2  PLT 229 273    Recent Labs  05/14/14 1420  NA 134*  K 4.3  CL 101  GLUCOSE 210*  BUN 32*  CREATININE 1.74*  CALCIUM 9.0   CBG (last 3)   Recent Labs  05/14/14 1654 05/14/14 2136 05/15/14 0716  GLUCAP 122* 172* 153*    Wt Readings from Last 3 Encounters:  05/13/14 54.7 kg (120 lb 9.5 oz)  10/24/13 58.06 kg (128 lb)  10/16/13 58.423 kg (128 lb 12.8 oz)    Physical Exam:  Gen: pt pleasant, alert, looks younger than stated age HENT: oral mucosa pink, dentition good Eyes: EOM are normal.  Neck: Normal range of motion. Neck supple. No thyromegaly present.  Cardiovascular:  Cardiac rate controlled without murmur Respiratory: Effort normal and breath sounds normal. No respiratory distress.no wheezes, rales  GI: Soft. Bowel sounds are normal. She exhibits no distension. No tenderness Neurological: She is alert and oriented to place and reason she's here. Right central 7 and mild tongue deviation. She is hard of hearing bilaterally but still is able to converse without additional effort or volume. She followed all simple commands. There wasa Occasionally delay in processing and language , but this appears much improved from prior notes in chart. RUE: 2- deltoid, biceps, triceps, 0/5 wrist and hand. RLE: + to 3- hf, ke, and adfl/apf. Sensation appeared to be grossly intact to light touch and pain in right upper, lower, and face.   Right tone: DTR's 3+,  toes up. foot is hypersentitive toutouch. 5 minus/5 in the left deltoid, biceps, triceps, grip, hip flexor, knee extensor, ankle dorsi flexors and plantar flexion Psych: pleasant, confused, redirectable  Uro: urine with odor  Assessment/Plan: 1. 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 Physiatrist is providing close team supervision and 24 hour management of active medical problems listed below. Physiatrist and rehab team continue to assess barriers to discharge/monitor patient progress toward functional and medical goals. FIM: FIM - Bathing Bathing Steps Patient Completed: Chest, Right Arm, Abdomen, Front perineal area, Right upper leg, Left upper leg, Right lower leg (including foot), Left lower leg (including foot) Bathing: 4: Min-Patient completes 8-9 32f 10 parts or 75+ percent  FIM - Upper Body Dressing/Undressing Upper body dressing/undressing steps patient completed: Thread/unthread left bra strap, Thread/unthread right sleeve of pullover shirt/dresss, Thread/unthread left sleeve of pullover shirt/dress Upper body dressing/undressing: 2: Max-Patient completed 25-49% of tasks FIM - Lower Body Dressing/Undressing Lower body dressing/undressing steps patient completed: Thread/unthread right underwear leg, Thread/unthread left underwear leg, Thread/unthread left pants leg Lower body dressing/undressing: 2: Max-Patient completed 25-49% of tasks  FIM - Toileting Toileting steps completed by patient: Adjust clothing prior to toileting, Performs perineal hygiene, Adjust clothing after toileting Toileting: 4: Steadying assist  FIM - Archivist Transfers: 4-To toilet/BSC: Min A (steadying Pt. > 75%), 4-From toilet/BSC: Min A (  steadying Pt. > 75%)  FIM - Bed/Chair Transfer Bed/Chair Transfer Assistive Devices: Arm rests Bed/Chair  Transfer: 4: Bed > Chair or W/C: Min A (steadying Pt. > 75%), 4: Chair or W/C > Bed: Min A (steadying Pt. > 75%), 5: Supine > Sit: Supervision (verbal cues/safety issues), 5: Sit > Supine: Supervision (verbal cues/safety issues)  FIM - Locomotion: Wheelchair Distance: 150 Locomotion: Wheelchair: 4: Travels 150 ft or more: maneuvers on rugs and over door sillls with minimal assistance (Pt.>75%) FIM - Locomotion: Ambulation Locomotion: Ambulation Assistive Devices: Other (comment) (HHA) Ambulation/Gait Assistance: 4: Min assist, 3: Mod assist Locomotion: Ambulation: 2: Travels 50 - 149 ft with moderate assistance (Pt: 50 - 74%)  Comprehension Comprehension Mode: Auditory Comprehension: 4-Understands basic 75 - 89% of the time/requires cueing 10 - 24% of the time  Expression Expression Mode: Verbal Expression: 3-Expresses basic 50 - 74% of the time/requires cueing 25 - 50% of the time. Needs to repeat parts of sentences.  Social Interaction Social Interaction: 4-Interacts appropriately 75 - 89% of the time - Needs redirection for appropriate language or to initiate interaction.  Problem Solving Problem Solving: 3-Solves basic 50 - 74% of the time/requires cueing 25 - 49% of the time  Memory Memory: 3-Recognizes or recalls 50 - 74% of the time/requires cueing 25 - 49% of the time  Medical Problem List and Plan: 1. Functional deficits secondary to cardio-embolic, left MCA branch infarct with hemorrhagic transformation after TPA 2. DVT Prophylaxis/Anticoagulation: SCDs. Monitor for any signs of DVT 3. Pain Management: Tylenol as needed---minimal pain at present 4. Mood/history of memory loss/mild dementia: Aricept 5 mg daily at bedtime  -appears to be having some sundowning in hospital  -exacerbated by cva and ?UTI  -recent fall---no major sequelae--minimize fall risk factors as possible 5. Neuropsych: This patient is not capable of making decisions on her own behalf. 6. Skin/Wound  Care: Routine skin checks 7. Fluids/Electrolytes/Nutrition:   Encouraging po fluids. Recheck labs today. Consider IVF pending results 8. Dysphagia/a fascia. Dysphagia 2 thin liquids. Follow per speech therapy 9. Hypertension/atrial fibrillation/pacemaker. Cardizem CD 120 mg daily,Multaq 400 mg twice a day, cardiac rate control 10.Bilat carotid Stenosis. VVS to see to help determine potential surgical needs moving forward 11. Hypothyroidism. Synthroid 12. Diabetes mellitus of peripheral neuropathy. Hemoglobin A1c 7.5. Sliding scale insulin.   -sugars generally acceptable at present 13. Chronic renal insufficiency. Baseline creatinine 1.36  -recheck labs as above. Likely volume depleted 14. CAD/NSTEMI/angioplasty. Continue aspirin. No chest pain or shortness of breath 15. Hyperlipidemia. Zetia, Crestor 16. Low grade temp, leukocytosis: urine with odor---ua equivocal, ucx still pending  LOS (Days) 2 A FACE TO FACE EVALUATION WAS PERFORMED  SWARTZ,ZACHARY T 05/15/2014 8:44 AM

## 2014-05-15 NOTE — Progress Notes (Signed)
Therapy reports some increased slurred speech and dizziness. Creatinine returned 1.46 and plan IV fluids.  OrderFor TED stockings thigh-high when out of bed. All issues discussed with Dr. Newell CoralNudelman.

## 2014-05-15 NOTE — Progress Notes (Signed)
Social Work Patient ID: [name], female   DOB: November 30, 1918, 81 y.o.   MRN: [id number]  Met with pt and family yesterday following team conference.  All aware of targeted d/c date of 5/9, however, son does point out that this is a shorter LOS than originally estimated PTA.  Explained to him that team was pleased with how pt was performing and this was their most current estimate.  Will likely d/c to [facility] through their Rehab division and her ability to return safely to an independent apartment is TBD.   Aware today that pt suffered a fall last night and today tx team reporting decline in overall performance.  Family has been able to discuss with MD/ PA and aware that scan to be performed.  Will stay posted on findings.  Braidon Chermak, LCSW

## 2014-05-15 NOTE — Progress Notes (Signed)
Patient ID: [name], female   DOB: 08-22-1918, 81 y.o.   MRN: [id number] Patient is sleeping comfortably. I did not wake her. Events of fall noted. Clearly no urgent need for carotid endarterectomy. If there is any future benefit we will determine this depending on her recovery over the next several weeks. Not following actively.

## 2014-05-15 NOTE — Progress Notes (Signed)
Social Work Patient ID: [name] Croworma Bossler, female   DOB: 26-Apr-1918, 81 y.o.   MRN: [id number]   Anselm PancoastLucy R Reedy Biernat, LCSW Social Worker Signed  Patient Care Conference 05/15/2014  2:11 PM    Expand All Collapse All   Inpatient RehabilitationTeam Conference and Plan of Care Update Date: 05/14/2014   Time: 2:55 PM     Patient Name: [name]       Medical Record Number: [id number]  Date of Birth: 26-Apr-1918 Sex: Female         Room/Bed: 4W12C/4W12C-01 Payor Info: Payor: MEDICARE / Plan: MEDICARE PART A AND B / Product Type: *No Product type* /    Admitting Diagnosis: CVA  Admit Date/Time:  05/13/2014  4:15 PM Admission Comments: No comment available   Primary Diagnosis:  Left middle cerebral artery stroke Principal Problem: Left middle cerebral artery stroke    Patient Active Problem List     Diagnosis  Date Noted   .  Left middle cerebral artery stroke  05/13/2014   .  Right hemiparesis  05/13/2014   .  Cerebral hemorrhage     .  Received intravenous tissue plasminogen activator (tPA) in emergency department     .  Stroke     .  Cytotoxic brain edema     .  CVA (cerebral infarction)  05/08/2014   .  Tachy-brady syndrome     .  Encounter for long-term (current) use of other medications     .  CAD in native artery     .  Atrial fibrillation     .  Hypothyroidism     .  Cardiac pacemaker in situ     .  Essential hypertension, benign     .  Pure hypercholesterolemia     .  Sinoatrial node dysfunction       Expected Discharge Date: Expected Discharge Date: 05/27/14  Team Members Present: Physician leading conference: Dr. Faith RogueZachary Swartz Social Worker Present: Amada JupiterLucy Lillyen Schow, LCSW Nurse Present: Chana Bodeeborah Sharp, RN PT Present: Bayard Huggerebecca Varner, PT OT Present: Ardis Rowanom Lanier, COTA;Jennifer Fredrich RomansSmith, OT;Kayla Perkinson, OT SLP Present: Feliberto Gottronourtney Payne, SLP Other (Discipline and Name): Ottie GlazierBarbara Boyette, RN [facility]) PPS Coordinator present : Tora DuckMarie Noel, RN, CRRN        Current Status/Progress  Goal   Weekly Team Focus   Medical     left mca infarct with hemorrhage, baseline dementia,   improve language, functional mobility   improve cognition, sleep   Bowel/Bladder     Pt continent of bowel and bladder. LBM 05/12/14   Pt to remain continent of bowel and bladder   Monitor   Swallow/Nutrition/ Hydration     Dys. 2 textures with thin liquids, Mod A for use of swallow strategies  Supervision  trials of upgraded textures, utilization of swallowing compensatory strategies    ADL's     min A functional transfers, mod-max A self-care tasks   supervision overall with min A dressing   R NM, functional transfers, awareness, safety, standing balance, strengthening, coordination, family education   Mobility     min-mod A up to 50 ft with R HHA  supervision overall  NMR, functional mobility training, balance, safety, activity tolerance, pt/family education   Communication     Min-Mod A  Supervision  utilization of speech intelligibility strategies, word-finding strategies   Safety/Cognition/ Behavioral Observations    Mod A  Supervision  intellectual awareness, working memory, functional problem solving    Pain     No c/o pain  <  3  Monitor for nonverbal cues of pain    Skin     Bruising to LUE, wrist with mild edema, L heel pink, blanchable-Allevyn dressing to area  No additional skin breakdown  Monitor q shift. Encourage to turn q 2hrs    Rehab Goals Patient on target to meet rehab goals: Yes *See Care Plan and progress notes for long and short-term goals.    Barriers to Discharge:  baseline cognitive deficits, apraxia, awareness      Possible Resolutions to Barriers:   ongoing language and functional based therapy      Discharge Planning/Teaching Needs:   return to [facility] with LOC to be determined        Team Discussion:    New eval. Some cognitive decline since CVA.  UTI?  Does not seem to have much awareness or appreciation of her deficits and is a safety risk for  falls.  Currently on D2.  Plan to return to [facility] upon d/c and team feels likely most appropriate to return through the SNF/ Rehab level of care.   Revisions to Treatment Plan:    None    Continued Need for Acute Rehabilitation Level of Care: The patient requires daily medical management by a physician with specialized training in physical medicine and rehabilitation for the following conditions: Daily direction of a multidisciplinary physical rehabilitation program to ensure safe treatment while eliciting the highest outcome that is of practical value to the patient.: Yes Daily medical management of patient stability for increased activity during participation in an intensive rehabilitation regime.: Yes Daily analysis of laboratory values and/or radiology reports with any subsequent need for medication adjustment of medical intervention for : Neurological problems;Other  Shoshanna Mcquitty 05/15/2014, 2:11 PM

## 2014-05-15 NOTE — Progress Notes (Signed)
Occupational Therapy Session Note  Patient Details  Name: [name] MRN: [id number] Date of Birth: 01-03-1919  Today's Date: 05/15/2014 OT Individual Time: 0817-0900 and [id number] OT Individual Time Calculation (min): 43 min and 11 min   Missed 17 min due to pt eating breakfast   Short Term Goals: Week 1:  OT Short Term Goal 1 (Week 1): Pt will complete bathing at min assist level OT Short Term Goal 2 (Week 1): Pt will complete UB dressing with mod assist and cues for hemi dressing technique OT Short Term Goal 3 (Week 1): Pt will complete LB dressing with mod assist OT Short Term Goal 4 (Week 1): Pt completed bathing and dressing with mod cues for positioning of RUE  Skilled Therapeutic Interventions/Progress Updates:    Pt seen for ADL retraining with focus on attention to RUE, awareness, functional mobility, standing balance, safety awareness. Pt received supine in bed with HOB elevated eating breakfast with NT present. Encouraged pt to sit EOB with therapist to complete breakfast and/or allow therapist to assist, however pt declining stating "I don't want to be pestered." Pt left in bed to complete breakfast with NT, therefore missing 17 min skilled OT. Pt ambulated bed>bathroom with min A and mod cues for upright posture. Completed bathing at shower level with max cues for attention to RUE and hand over hand assist to utilize when washing LUE. Completed dressing sit<>stand from w/c with mod cues for hemi dressing technique and max cues for awareness of RUE. Pt required max cues throughout session for safety secondary to impulsivity. Explained fall risk, however little carryover noted. Pt completed oral care in sitting at sink with total A for use of RUE as gross stabilizer. Provided Giv Mohr sling to be utilized during functional mobility and standing tasks to prevent shoulder subluxation. At end of session pt left sitting in w/c with all needs in reach. Will follow-up as able to make-up  missed therapy minutes.   Session 2: Pt seen for 1:1 OT session (make-up from missed minutes in AM) with focus on RUE NMR. Pt received supine in bed with RN present to begin IV fluids. RN provided verbal order that therapy may proceed despite dizziness, increased dysarthria, and decreased balance. Pt agreeable to complete AAROM and PROM from bed level. Provided AAROM to shoulder and elbow with pt demonstrate trace bicep activation and improved tricep extension. Provided PROM to forearm, wrist, and digits. Pt falling asleep during session therefore left supine in bed with all needs in reach.    Therapy Documentation Precautions:  Precautions Precautions: Fall Precaution Comments: pacemaker Restrictions Weight Bearing Restrictions: No General: General OT Amount of Missed Time: 17 Minutes Vital Signs:  Pain: No report of pain  See FIM for current functional status  Therapy/Group: Individual Therapy  Daneil Danerkinson, [name] 05/15/2014, 9:07 AM

## 2014-05-16 ENCOUNTER — Inpatient Hospital Stay (HOSPITAL_COMMUNITY): Payer: Medicare Other

## 2014-05-16 ENCOUNTER — Inpatient Hospital Stay (HOSPITAL_COMMUNITY): Payer: Medicare Other | Admitting: Physical Therapy

## 2014-05-16 ENCOUNTER — Inpatient Hospital Stay (HOSPITAL_COMMUNITY): Payer: Medicare Other | Admitting: Speech Pathology

## 2014-05-16 LAB — BASIC METABOLIC PANEL
Anion gap: 11 (ref 5–15)
BUN: 19 mg/dL (ref 6–23)
CALCIUM: 9.1 mg/dL (ref 8.4–10.5)
CHLORIDE: 102 mmol/L (ref 96–112)
CO2: 24 mmol/L (ref 19–32)
Creatinine, Ser: 1.44 mg/dL — ABNORMAL HIGH (ref 0.50–1.10)
GFR calc Af Amer: 35 mL/min — ABNORMAL LOW (ref >90)
GFR calc non Af Amer: 30 mL/min — ABNORMAL LOW (ref >90)
Glucose, Bld: 127 mg/dL — ABNORMAL HIGH (ref 70–99)
POTASSIUM: 4.1 mmol/L (ref 3.5–5.1)
Sodium: 137 mmol/L (ref 135–145)

## 2014-05-16 LAB — GLUCOSE, CAPILLARY
GLUCOSE-CAPILLARY: 133 mg/dL — AB (ref 70–99)
GLUCOSE-CAPILLARY: 140 mg/dL — AB (ref 70–99)
GLUCOSE-CAPILLARY: 139 mg/dL — AB (ref 70–99)
Glucose-Capillary: 167 mg/dL — ABNORMAL HIGH (ref 70–99)

## 2014-05-16 NOTE — IPOC Note (Signed)
Overall Plan of Care [facility]) Patient Details Name: [name] MRN: [id number] DOB: 10-05-1918  Admitting Diagnosis: CVA  Hospital Problems: Principal Problem:   Left middle cerebral artery stroke Active Problems:   Essential hypertension, benign   Right hemiparesis     Functional Problem List: Nursing Endurance, Safety, Skin Integrity  PT Balance, Behavior, Edema, Endurance, Motor, Nutrition, Perception, Safety, Sensory  OT Balance, Cognition, Perception, Safety, Sensory, Endurance, Motor  SLP Behavior, Cognition  TR         Basic ADL's: OT Grooming, Bathing, Dressing, Toileting, Eating     Advanced  ADL's: OT       Transfers: PT Bed Mobility, Bed to Chair, Car, Occupational psychologist, Research scientist (life sciences): PT Ambulation, Psychologist, prison and probation services, Stairs     Additional Impairments: OT Fuctional Use of Upper Extremity (R hemiplegia)  SLP Swallowing, Communication, Social Cognition comprehension, expression Social Interaction, Awareness, Problem Solving, Memory, Attention  TR      Anticipated Outcomes Item Anticipated Outcome  Self Feeding supervision  Swallowing  Supervision   Basic self-care  min A dressing and supervision bathing  Toileting  supervision   Bathroom Transfers supervision   Bowel/Bladder  Mod I  Transfers  supervision  Locomotion  supervision   Communication  Supervision  Cognition  Min A-supervision   Pain  <3  Safety/Judgment  Supervision   Therapy Plan: PT Intensity: Minimum of 1-2 x/day ,45 to 90 minutes PT Frequency: 5 out of 7 days PT Duration Estimated Length of Stay: 10-14 days OT Intensity: Minimum of 1-2 x/day, 45 to 90 minutes OT Frequency: 5 out of 7 days OT Duration/Estimated Length of Stay: 10-14 days SLP Intensity: Minumum of 1-2 x/day, 30 to 90 minutes SLP Frequency: 3 to 5 out of 7 days SLP Duration/Estimated Length of Stay: 10-14 days        Team Interventions: Nursing Interventions Disease  Management/Prevention, Medication Management  PT interventions Ambulation/gait training, Warden/ranger, Discharge planning, Community reintegration, Disease management/prevention, Fish farm manager, Functional mobility training, Neuromuscular re-education, Patient/family education, Psychosocial support, Stair training, Therapeutic Activities, Therapeutic Exercise, UE/LE Strength taining/ROM, UE/LE Coordination activities, Wheelchair propulsion/positioning  OT Interventions Warden/ranger, Cognitive remediation/compensation, Discharge planning, Community reintegration, DME/adaptive equipment instruction, Functional mobility training, Neuromuscular re-education, Pain management, Psychosocial support, Patient/family education, Self Care/advanced ADL retraining, Therapeutic Activities, Therapeutic Exercise, UE/LE Strength taining/ROM, UE/LE Coordination activities, Wheelchair propulsion/positioning, Visual/perceptual remediation/compensation, Splinting/orthotics  SLP Interventions Cognitive remediation/compensation, Cueing hierarchy, Dysphagia/aspiration precaution training, Environmental controls, Functional tasks, Internal/external aids, Oral motor exercises, Patient/family education, Speech/Language facilitation, Therapeutic Activities  TR Interventions    SW/CM Interventions Discharge Planning, Psychosocial Support, Patient/Family Education    Team Discharge Planning: Destination: PT-Assisted Living ,OT- Assisted Living , SLP-Assisted Living Projected Follow-up: PT-Home health PT, OT-  Home health OT, SLP-24 hour supervision/assistance (ALF) Projected Equipment Needs: PT-To be determined, OT- To be determined, SLP-None recommended by SLP Equipment Details: PT- , OT-  Patient/family involved in discharge planning: PT- Patient, Family member/caregiver,  OT-Patient, SLP-Patient  MD ELOS: 16-22d Medical Rehab Prognosis:  Good Assessment: 81 y.o. right hand  female with history of hyperlipidemia, diabetes mellitus and peripheral neuropathy, chronic renal insufficiency baseline creatinine 1.36, Coronary artery disease with angioplasty, atrial fibrillation with pacemaker maintained on aspirin. She had been on Coumadin in the past but discontinued due to GI bleed. Patient [facility] a [facility]. Patient independent prior to admission. Presented 05/08/2014 with altered mental status slurred speech and right facial droop with right-sided weakness. Initial cranial  CT scan showed no acute intracranial abnormality. She did receive TPA. Patient with increased right side hemiparesis as well as expressive aphasia. Repeat CT of the head showed multiple new parenchymal and subarachnoid bleeds since prior study   Now requiring 24/7 Rehab RN,MD, as well as CIR level PT, OT and SLP.  Treatment team will focus on ADLs and mobility with goals set at Sup  See Team Conference Notes for weekly updates to the plan of care

## 2014-05-16 NOTE — Progress Notes (Addendum)
PHYSICAL MEDICINE & REHABILITATION     PROGRESS NOTE    Subjective/Complaints: Uneventful evening. Perhaps a little more sluggish yesterday. Denies any problems this morning. No n/v/d/cp/sob. No back/leg pain.  Objective: Vital Signs: Blood pressure 174/46, pulse 63, temperature 98 F (36.7 C), temperature source Oral, resp. rate 17, SpO2 95 %. Ct Head Wo Contrast  05/15/2014   CLINICAL DATA:  Increased confusion and slurred speech. Fall last night striking the right frontal forehead. History of stroke. Intracranial hemorrhage.  EXAM: CT HEAD WITHOUT CONTRAST  TECHNIQUE: Contiguous axial images were obtained from the base of the skull through the vertex without intravenous contrast.  COMPARISON:  05/10/2014  FINDINGS: 2.3 by 2.2 cm left frontal lobe parenchymal hematoma, stable from 05/10/2014, with a small amount of surrounding vasogenic edema.  Increased conspicuity of the left peri-Rolandic cortex infarct, with stable general or subarachnoid hemorrhage at the left cerebral vertex. Mildly reduced conspicuity of the right posterior vertex subarachnoid hemorrhage.  No new or increasing hemorrhage identified. No hydrocephalus or midline shift.  Tiny remote lacunar infarct in the head of the left caudate nucleus. Periventricular white matter and corona radiata hypodensities favor chronic ischemic microvascular white matter disease.  Mild chronic ethmoid sinusitis. There is atherosclerotic calcification of the cavernous carotid arteries bilaterally.  IMPRESSION: 1. Stable intracranial hemorrhage without progression or new hemorrhage. Slightly increased conspicuity of the left peri-Rolandic cortex infarct. 2. No hydrocephalus, midline shift, or new significant findings. 3. Periventricular white matter and corona radiata hypodensities favor chronic ischemic microvascular white matter disease. 4. Mild chronic ethmoid sinusitis.   Electronically Signed   By: Gaylyn Rong M.D.   On: 05/15/2014  15:43    Recent Labs  05/14/14 1420  WBC 10.6*  HGB 14.6  HCT 43.2  PLT 273    Recent Labs  05/15/14 1100 05/16/14 0605  NA 136 137  K 4.5 4.1  CL 104 102  GLUCOSE 194* 127*  BUN 26* 19  CREATININE 1.45* 1.44*  CALCIUM 9.1 9.1   CBG (last 3)   Recent Labs  05/15/14 1636 05/15/14 2104 05/16/14 0623  GLUCAP 132* 134* 133*    Wt Readings from Last 3 Encounters:  05/13/14 54.7 kg (120 lb 9.5 oz)  10/24/13 58.06 kg (128 lb)  10/16/13 58.423 kg (128 lb 12.8 oz)    Physical Exam:  Gen: pt pleasant, alert, looks younger than stated age HENT: oral mucosa pink, dentition good Eyes: EOM are normal.  Neck: Normal range of motion. Neck supple. No thyromegaly present.  Cardiovascular:  Cardiac rate controlled without murmur Respiratory: Effort normal and breath sounds normal. No respiratory distress.no wheezes, rales  GI: Soft. Bowel sounds are normal. She exhibits no distension. No tenderness Neurological: She is alert and oriented to place and reason she's here. Right central 7 and mild tongue deviation. She is hard of hearing bilaterally but still is able to converse without additional effort or volume. She followed all simple commands. There wasa Occasionally delay in processing and language , but this appears much improved from prior notes in chart. RUE: 2- deltoid, biceps, triceps, 0/5 wrist and hand. RLE: + to 3- hf, ke, and adfl/apf. Sensation appeared to be grossly intact to light touch and pain in right upper, lower, and face.   Right tone: DTR's 3+, toes up. foot is hypersentitive toutouch. 5 minus/5 in the left deltoid, biceps, triceps, grip, hip flexor, knee extensor, ankle dorsi flexors and plantar flexion Psych: pleasant, confused, redirectable  Uro: urine with odor  Assessment/Plan: 1. Functional deficits secondary to embolic left MCA infarct with hemorrhagic conversion which require 3+ hours per day of interdisciplinary therapy in a comprehensive  inpatient rehab setting. Physiatrist is providing close team supervision and 24 hour management of active medical problems listed below. Physiatrist and rehab team continue to assess barriers to discharge/monitor patient progress toward functional and medical goals. FIM: FIM - Bathing Bathing Steps Patient Completed: Chest, Right Arm, Abdomen, Front perineal area, Right upper leg, Left upper leg, Right lower leg (including foot), Left lower leg (including foot), Buttocks Bathing: 4: Min-Patient completes 8-9 60f 10 parts or 75+ percent  FIM - Upper Body Dressing/Undressing Upper body dressing/undressing steps patient completed: Thread/unthread left bra strap, Thread/unthread right sleeve of pullover shirt/dresss, Put head through opening of pull over shirt/dress Upper body dressing/undressing: 2: Max-Patient completed 25-49% of tasks FIM - Lower Body Dressing/Undressing Lower body dressing/undressing steps patient completed: Thread/unthread right underwear leg, Thread/unthread left underwear leg, Thread/unthread left pants leg, Thread/unthread right pants leg, Don/Doff left shoe, Don/Doff right shoe Lower body dressing/undressing: 2: Max-Patient completed 25-49% of tasks  FIM - Toileting Toileting steps completed by patient: Performs perineal hygiene Toileting: 2: Max-Patient completed 1 of 3 steps  FIM - Diplomatic Services operational officer Devices: Grab bars Toilet Transfers: 4-To toilet/BSC: Min A (steadying Pt. > 75%), 4-From toilet/BSC: Min A (steadying Pt. > 75%)  FIM - Bed/Chair Transfer Bed/Chair Transfer Assistive Devices: Arm rests Bed/Chair Transfer: 4: Supine > Sit: Min A (steadying Pt. > 75%/lift 1 leg), 4: Bed > Chair or W/C: Min A (steadying Pt. > 75%), 5: Sit > Supine: Supervision (verbal cues/safety issues)  FIM - Locomotion: Wheelchair Distance: 150 Locomotion: Wheelchair: 2: Travels 50 - 149 ft with minimal assistance (Pt.>75%) FIM - Locomotion:  Ambulation Locomotion: Ambulation Assistive Devices: Other (comment) (HHA) Ambulation/Gait Assistance: 4: Min assist, 3: Mod assist Locomotion: Ambulation: 0: Activity did not occur  Comprehension Comprehension Mode: Auditory Comprehension: 5-Follows basic conversation/direction: With no assist  Expression Expression Mode: Verbal Expression: 5-Expresses basic needs/ideas: With extra time/assistive device  Social Interaction Social Interaction: 6-Interacts appropriately with others with medication or extra time (anti-anxiety, antidepressant).  Problem Solving Problem Solving: 3-Solves basic 50 - 74% of the time/requires cueing 25 - 49% of the time  Memory Memory: 4-Recognizes or recalls 75 - 89% of the time/requires cueing 10 - 24% of the time  Medical Problem List and Plan: 1. Functional deficits secondary to cardio-embolic, left MCA branch infarct with hemorrhagic transformation after TPA  -CT negative for acute changes today---observe neuro status/arousal today---likely volume related 2. DVT Prophylaxis/Anticoagulation: SCDs. Monitor for any signs of DVT 3. Pain Management: Tylenol as needed---minimal pain at present 4. Mood/history of memory loss/mild dementia: Aricept 5 mg daily at bedtime  -appears to be having some sundowning in hospital  -exacerbated by cva   -fall risk-minimize fall risk factors as possible 5. Neuropsych: This patient is not capable of making decisions on her own behalf. 6. Skin/Wound Care: Routine skin checks 7. Fluids/Electrolytes/Nutrition:   Encouraging po fluids.   -see below 8. Dysphagia/a fascia. Dysphagia 2 thin liquids. advance per speech therapy 9. Hypertension/atrial fibrillation/pacemaker. Cardizem CD 120 mg daily,Multaq 400 mg twice a day, cardiac rate control 10.Bilat carotid Stenosis. VVS to see to help determine potential surgical needs moving forward 11. Hypothyroidism. Synthroid 12. Diabetes mellitus of peripheral neuropathy.  Hemoglobin A1c 7.5. Sliding scale insulin.   -sugars generally acceptable at present 13. Chronic renal insufficiency/mild acute renal insufficiency. Baseline creatinine 1.36  -volume depleted ----  improved now after IVF  -dc ivf and encourage PO  -follow labs 14. CAD/NSTEMI/angioplasty. Continue aspirin. No chest pain or shortness of breath 15. Hyperlipidemia. Zetia, Crestor 16. Low grade temp, leukocytosis: urine with odor---ucx negative  -follow up labs tomorrow  LOS (Days) 3 A FACE TO FACE EVALUATION WAS PERFORMED  Jordie Schreur T 05/16/2014 8:42 AM

## 2014-05-16 NOTE — IPOC Note (Signed)
Overall Plan of Care [facility](IPOC) Patient Details Name: Mickel Croworma [place] MRN: [id number] DOB: 08-05-1918  Admitting Diagnosis: CVA  Hospital Problems: Principal Problem:   Left middle cerebral artery stroke Active Problems:   Essential hypertension, benign   Right hemiparesis     Functional Problem List: Nursing Endurance, Safety, Skin Integrity  PT Balance, Behavior, Edema, Endurance, Motor, Nutrition, Perception, Safety, Sensory  OT Balance, Cognition, Perception, Safety, Sensory, Endurance, Motor  SLP Behavior, Cognition  TR         Basic ADL's: OT Grooming, Bathing, Dressing, Toileting, Eating     Advanced  ADL's: OT       Transfers: PT Bed Mobility, Bed to Chair, Car, Occupational psychologisturniture  OT Toilet, Research scientist (life sciences)Tub/Shower     Locomotion: PT Ambulation, Psychologist, prison and probation servicesWheelchair Mobility, Stairs     Additional Impairments: OT Fuctional Use of Upper Extremity (R hemiplegia)  SLP Swallowing, Communication, Social Cognition comprehension, expression Social Interaction, Awareness, Problem Solving, Memory, Attention  TR      Anticipated Outcomes Item Anticipated Outcome  Self Feeding supervision  Swallowing  Supervision   Basic self-care  min A dressing and supervision bathing  Toileting  supervision   Bathroom Transfers supervision   Bowel/Bladder  Mod I  Transfers  supervision  Locomotion  supervision   Communication  Supervision  Cognition  Min A-supervision   Pain  <3  Safety/Judgment  Supervision   Therapy Plan: PT Intensity: Minimum of 1-2 x/day ,45 to 90 minutes PT Frequency: 5 out of 7 days PT Duration Estimated Length of Stay: 10-14 days OT Intensity: Minimum of 1-2 x/day, 45 to 90 minutes OT Frequency: 5 out of 7 days OT Duration/Estimated Length of Stay: 10-14 days SLP Intensity: Minumum of 1-2 x/day, 30 to 90 minutes SLP Frequency: 3 to 5 out of 7 days SLP Duration/Estimated Length of Stay: 10-14 days        Team Interventions: Nursing Interventions Disease  Management/Prevention, Medication Management  PT interventions Ambulation/gait training, Warden/rangerBalance/vestibular training, Discharge planning, Community reintegration, Disease management/prevention, Fish farm managerDME/adaptive equipment instruction, Functional mobility training, Neuromuscular re-education, Patient/family education, Psychosocial support, Stair training, Therapeutic Activities, Therapeutic Exercise, UE/LE Strength taining/ROM, UE/LE Coordination activities, Wheelchair propulsion/positioning  OT Interventions Warden/rangerBalance/vestibular training, Cognitive remediation/compensation, Discharge planning, Community reintegration, DME/adaptive equipment instruction, Functional mobility training, Neuromuscular re-education, Pain management, Psychosocial support, Patient/family education, Self Care/advanced ADL retraining, Therapeutic Activities, Therapeutic Exercise, UE/LE Strength taining/ROM, UE/LE Coordination activities, Wheelchair propulsion/positioning, Visual/perceptual remediation/compensation, Splinting/orthotics  SLP Interventions Cognitive remediation/compensation, Cueing hierarchy, Dysphagia/aspiration precaution training, Environmental controls, Functional tasks, Internal/external aids, Oral motor exercises, Patient/family education, Speech/Language facilitation, Therapeutic Activities  TR Interventions    SW/CM Interventions Discharge Planning, Psychosocial Support, Patient/Family Education    Team Discharge Planning: Destination: PT-Assisted Living ,OT- Assisted Living , SLP-Assisted Living Projected Follow-up: PT-Home health PT, OT-  Home health OT, SLP-24 hour supervision/assistance (ALF) Projected Equipment Needs: PT-To be determined, OT- To be determined, SLP-None recommended by SLP Equipment Details: PT- , OT-  Patient/family involved in discharge planning: PT- Patient, Family member/caregiver,  OT-Patient, SLP-Patient  MD ELOS: 10-14 days Medical Rehab Prognosis:  Excellent Assessment: The patient  has been admitted for CIR therapies with the diagnosis of left MCA infarct with hemorrhagic conversion. The team will be addressing functional mobility, strength, stamina, balance, safety, adaptive techniques and equipment, self-care, bowel and bladder mgt, patient and caregiver education, communication, cognition, swallowing, ego support, community reintegration. Goals have been set at supervision for self-care, ADL's, mobility, communication, supervision to min assist with cognition.    Ranelle OysterZachary T. Satoru Milich, MD, [place] DomFAAPMR  See Team Conference Notes for weekly updates to the plan of care

## 2014-05-16 NOTE — Progress Notes (Signed)
Occupational Therapy Note  Patient Details  Name: [name] MRN: [id number] Date of Birth: 1918-07-31  Today's Date: 05/16/2014 OT Individual Time: 1300-1400 OT Individual Time Calculation (min): 60 min   Pt denied pain Individual therapy  Pt resting in bed upon arrival and agreeable to participate in therapy.  Pt requested to use toilet prior to transitioning to therapy gym.  Pt performed stand pivot transfer with min A.  Pt required assistance with clothing management but was able to perform hygiene task.  Pt engaged in RUE NMR in seated position, including weight bearing through RUE while reaching across midline with LUE to retrieve target objects.  Pt exhibits difficulty isolating muscle activity and exhibits strong compensatory adjustments.  Pt transitioned to supine position to eliminate trunk activity.  Pt exhibited slight biceps with reduced compensatory strategies.  Pt also exhibited increased triceps with reduced compensatory strategies.  Pt able to perform supine->sit EOM with supervision.    [name], Orton Capell [facility] LLCChappell 05/16/2014, 2:53 PM

## 2014-05-16 NOTE — Progress Notes (Signed)
Physical Therapy Session Note  Patient Details  Name: [name] MRN: [id number] Date of Birth: 12/30/1918  Today's Date: 05/16/2014 PT Individual Time: 0900-1000 PT Individual Time Calculation (min): 60 min   Short Term Goals: Week 1:  PT Short Term Goal 1 (Week 1): = LTGs of overall supervision  Skilled Therapeutic Interventions/Progress Updates:   Session focused on functional ambulation, postural control, strengthening, standing balance, and activity tolerance. Patient received sitting in wheelchair, reporting need for bathroom with questioning. Patient ambulated to/from bathroom with min A and performed toileting tasks with min A for balance, see FIM. Patient washed hands at wheelchair level with assist for incorporating RUE. Gait training using RW with R hand orthosis, 2 x 50 ft with min A overall, total multimodal cues for hip extension to neutral, upright posture and forward gaze. Patient able to achieve hip/trunk extension to neutral during gait for very brief periods of 5-15 sec before requiring cueing. Patient fatigued quickly and reported she felt "exhausted" remainder of session, frequent rest breaks provided. Berg Balance Scale initiated. Patient demonstrates increased fall risk as noted by score of   17/56 on Berg Balance Scale (<36= high risk for falls, close to 100%; 37-45 significant >80%; 46-51 moderate >50%; 52-55 lower >25%). Patient and son educated on falls risk and impulsive behaviors and need to call for assistance in room. Patient left sitting in wheelchair with quick release belt on, 1/2 lap tray in place, and needs within reach. RN aware of patient location and small skin tear on R forearm noted at beginning of session.   Therapy Documentation Precautions:  Precautions Precautions: Fall Precaution Comments: pacemaker Restrictions Weight Bearing Restrictions: No Pain: Pain Assessment Pain Assessment: No/denies pain Locomotion : Ambulation Ambulation/Gait  Assistance: 4: Min assist  Balance: Programmer, systemsBerg Balance Test Sit to Stand: Able to stand  independently using hands Standing Unsupported: Able to stand 30 seconds unsupported Sitting with Back Unsupported but Feet Supported on Floor or Stool: Able to sit safely and securely 2 minutes Stand to Sit: Sits safely with minimal use of hands Transfers: Needs one person to assist Standing Unsupported with Eyes Closed: Unable to keep eyes closed 3 seconds but stays steady Standing Ubsupported with Feet Together: Needs help to attain position but able to stand for 30 seconds with feet together From Standing, Reach Forward with Outstretched Arm: Reaches forward but needs supervision From Standing Position, Pick up Object from Floor: Unable to try/needs assist to keep balance From Standing Position, Turn to Look Behind Over each Shoulder: Needs assist to keep from losing balance and falling Turn 360 Degrees: Needs assistance while turning Standing Unsupported, Alternately Place Feet on Step/Stool: Needs assistance to keep from falling or unable to try Standing Unsupported, One Foot in Front: Loses balance while stepping or standing Standing on One Leg: Unable to try or needs assist to prevent fall Total Score: 17/56  See FIM for current functional status  Therapy/Group: Individual Therapy  Kerney ElbeVarner, Brylan Dec A 05/16/2014, 10:44 AM

## 2014-05-16 NOTE — Progress Notes (Signed)
Speech Language Pathology Daily Session Note  Patient Details  Name: [name] MRN: [id number] Date of Birth: [birth year]  Today's Date: 05/16/2014 SLP Individual Time: 1500-1530 SLP Individual Time Calculation (min): 30 min  Short Term Goals: Week 1: SLP Short Term Goal 1 (Week 1): Patient will consume current diet with minimal overt s/s of aspiration with Min A multimodal cues for use of swallowing compensatory straegies. SLP Short Term Goal 2 (Week 1): Patient will demonstrate efficient mastication of Dys. 3 textures evidenced by minimal oral residue without overt s/s of aspiration with Min A multimodal cues.  SLP Short Term Goal 3 (Week 1): Patient will utilize external memory aids to recall new, daily information with Min A multimodal cues.  SLP Short Term Goal 4 (Week 1): Patient will identify 2 cognitive and 2 physical changes with Mod A multimodal cues.  SLP Short Term Goal 5 (Week 1): Patient will utilize word-finding strategies at the phrase level with Mod A multimodal cues.  SLP Short Term Goal 6 (Week 1): Patient will utilize speech intelligibility strategies at the sentence level with supervision verbal cues.   Skilled Therapeutic Interventions: Skilled treatment session focused on speech and language goals. SLP facilitated session by providing education to patient in regards to speech intelligibility strategies, a handout was also given to reinforce strategies and to improve carryover. Patient utilized strategies at the phrase level during a structured verbal expression task with Min A verbal and question cues and required Mod A multimodal cues for word-finding throughout task with use of word-finding strategies. Patient demonstrated poor carryover and understanding of rules to task which impacted her overall function throughout the session. Patient left upright in the wheelchair with quick release belt in place and all needs within reach. Continue with current plan of care.     FIM:  Comprehension Comprehension Mode: Auditory Comprehension: 5-Follows basic conversation/direction: With no assist Expression Expression Mode: Verbal Expression: 4-Expresses basic 75 - 89% of the time/requires cueing 10 - 24% of the time. Needs helper to occlude trach/needs to repeat words. Social Interaction Social Interaction: 6-Interacts appropriately with others with medication or extra time (anti-anxiety, antidepressant). Problem Solving Problem Solving: 3-Solves basic 50 - 74% of the time/requires cueing 25 - 49% of the time Memory Memory: 3-Recognizes or recalls 50 - 74% of the time/requires cueing 25 - 49% of the time  Pain Pain Assessment Pain Assessment: No/denies pain  Therapy/Group: Individual Therapy  [name] 05/16/2014, 5:30 PM

## 2014-05-16 NOTE — Progress Notes (Signed)
Occupational Therapy Session Note  Patient Details  Name: [name] MRN: [id number] Date of Birth: 10/29/1918  Today's Date: 05/16/2014 OT Individual Time: 0800-0900 OT Individual Time Calculation (min): 60 min    Short Term Goals: Week 1:  OT Short Term Goal 1 (Week 1): Pt will complete bathing at min assist level OT Short Term Goal 2 (Week 1): Pt will complete UB dressing with mod assist and cues for hemi dressing technique OT Short Term Goal 3 (Week 1): Pt will complete LB dressing with mod assist OT Short Term Goal 4 (Week 1): Pt completed bathing and dressing with mod cues for positioning of RUE  Skilled Therapeutic Interventions/Progress Updates:    Pt engaged in BADL retraining including bathing and dressing with sit<>stand from w/c at sink.  Pt requested to use toilet prior to bathing/dressing tasks.  Pt required mod verbal cues for RUE positioning and attention to RUE.  Pt performs sit<>stand at sink with steady A but requires max A for dressing tasks.  Hemi dressing/bathing strategies demonstrated and patient required [facility] assistance to perform.  Pt transitioned to eating breakfast with focus on self feeding and swallowing strategies.  Pt required max verbal cues for multiple swallows with each mouthful and to clear mouth prior to drinking liquids.  Focus on activity tolerance, sit<>stand, standing balance, functional transfers, attention to RUE, compensatory strategies and dressing/bathing techniques, and safety awareness.  Therapy Documentation Precautions:  Precautions Precautions: Fall Precaution Comments: pacemaker Restrictions Weight Bearing Restrictions: No  Pain: Pain Assessment Pain Assessment: No/denies pain  See FIM for current functional status  Therapy/Group: Individual Therapy  Rich BraveLanier, Aero Drummonds [name] 05/16/2014, 9:04 AM

## 2014-05-17 ENCOUNTER — Inpatient Hospital Stay (HOSPITAL_COMMUNITY): Payer: Medicare Other | Admitting: Physical Therapy

## 2014-05-17 ENCOUNTER — Inpatient Hospital Stay (HOSPITAL_COMMUNITY): Payer: Medicare Other | Admitting: Speech Pathology

## 2014-05-17 ENCOUNTER — Inpatient Hospital Stay (HOSPITAL_COMMUNITY): Payer: Medicare Other | Admitting: Occupational Therapy

## 2014-05-17 LAB — CBC
HCT: 43.2 % (ref 36.0–46.0)
Hemoglobin: 14.4 g/dL (ref 12.0–15.0)
MCH: 30.1 pg (ref 26.0–34.0)
MCHC: 33.3 g/dL (ref 30.0–36.0)
MCV: 90.4 fL (ref 78.0–100.0)
Platelets: 293 10*3/uL (ref 150–400)
RBC: 4.78 MIL/uL (ref 3.87–5.11)
RDW: 13.2 % (ref 11.5–15.5)
WBC: 8.2 10*3/uL (ref 4.0–10.5)

## 2014-05-17 LAB — BASIC METABOLIC PANEL
Anion gap: 10 (ref 5–15)
BUN: 18 mg/dL (ref 6–23)
CHLORIDE: 104 mmol/L (ref 96–112)
CO2: 22 mmol/L (ref 19–32)
CREATININE: 1.4 mg/dL — AB (ref 0.50–1.10)
Calcium: 9.2 mg/dL (ref 8.4–10.5)
GFR calc non Af Amer: 31 mL/min — ABNORMAL LOW (ref >90)
GFR, EST AFRICAN AMERICAN: 36 mL/min — AB (ref >90)
Glucose, Bld: 168 mg/dL — ABNORMAL HIGH (ref 70–99)
Potassium: 4.1 mmol/L (ref 3.5–5.1)
SODIUM: 136 mmol/L (ref 135–145)

## 2014-05-17 LAB — GLUCOSE, CAPILLARY
GLUCOSE-CAPILLARY: 175 mg/dL — AB (ref 70–99)
GLUCOSE-CAPILLARY: 154 mg/dL — AB (ref 70–99)
GLUCOSE-CAPILLARY: 133 mg/dL — AB (ref 70–99)
Glucose-Capillary: 128 mg/dL — ABNORMAL HIGH (ref 70–99)

## 2014-05-17 NOTE — Progress Notes (Signed)
Speech Language Pathology Daily Session Note  Patient Details  Name: [name] MRN: [id number] Date of Birth: 12-13-1918  Today's Date: 05/17/2014 SLP Individual Time: 0730-0830 SLP Individual Time Calculation (min): 60 min  Short Term Goals: Week 1: SLP Short Term Goal 1 (Week 1): Patient will consume current diet with minimal overt s/s of aspiration with Min A multimodal cues for use of swallowing compensatory straegies. SLP Short Term Goal 2 (Week 1): Patient will demonstrate efficient mastication of Dys. 3 textures evidenced by minimal oral residue without overt s/s of aspiration with Min A multimodal cues.  SLP Short Term Goal 3 (Week 1): Patient will utilize external memory aids to recall new, daily information with Min A multimodal cues.  SLP Short Term Goal 4 (Week 1): Patient will identify 2 cognitive and 2 physical changes with Mod A multimodal cues.  SLP Short Term Goal 5 (Week 1): Patient will utilize word-finding strategies at the phrase level with Mod A multimodal cues.  SLP Short Term Goal 6 (Week 1): Patient will utilize speech intelligibility strategies at the sentence level with supervision verbal cues.   Skilled Therapeutic Interventions: Skilled treatment session focused on dysphagia and speech and language goals. Upon arrival, patient was awake while supine in bed. SLP facilitated session by transferring patient to wheelchair to optimize proper positioning for PO intake. Patient consumed breakfast meal of Dys. 2 textures with thin liquids without overt s/s of aspiration and required Min A verbal cues for use of small bites and Mod A verbal cues for use of a slow rate of self-feeding. Patient had a moderate amount of oral residue after each swallow that cleared with a liquid wash. Recommend patient continue current diet at this time.  SLP also facilitated session by providing Min A verbal and question cues for utilization of speech intelligibility strategies at the sentence  level during an oral reading task at the paragraph level. Patient also required total A to self-monitor and correct errors throughout task. Patient left in wheelchair with quick release belt in place and all needs within reach. Continue with current plan of care.    FIM:  Comprehension Comprehension: 4-Understands basic 75 - 89% of the time/requires cueing 10 - 24% of the time Expression Expression: 4-Expresses basic 75 - 89% of the time/requires cueing 10 - 24% of the time. Needs helper to occlude trach/needs to repeat words. Social Interaction Social Interaction: 6-Interacts appropriately with others with medication or extra time (anti-anxiety, antidepressant). Problem Solving Problem Solving: 3-Solves basic 50 - 74% of the time/requires cueing 25 - 49% of the time Memory Memory: 3-Recognizes or recalls 50 - 74% of the time/requires cueing 25 - 49% of the time FIM - Eating Eating Activity: 5: Supervision/cues;5: Set-up assist for open containers;4: Helper checks for pocketed food  Pain Pain Assessment Pain Assessment: No/denies pain  Therapy/Group: Individual Therapy  [name] 05/17/2014, 9:47 AM

## 2014-05-17 NOTE — Progress Notes (Signed)
Physical Therapy Session Note  Patient Details  Name: [name] MRN: [id number] Date of Birth: Dec 23, 1918  Today's Date: 05/17/2014 PT Individual Time: 1400-1500 PT Individual Time Calculation (min): 60 min   Short Term Goals: Week 1:  PT Short Term Goal 1 (Week 1): = LTGs of overall supervision  Skilled Therapeutic Interventions/Progress Updates:   Session focused on functional mobility training, NMR, strengthening, and activity tolerance. Patient received semi reclined in bed, transferred supine > sit with HOB flat and no rail with supervision. Patient ambulated to/from bathroom with R HHA and performed toileting with steady assist and assist for clothing management after toileting. Gait using RW with R hand orthosis x 50 ft + 35 ft with min A overall for balance, max verbal/tactile cues for upright posture and hip/trunk extension to neutral and to decrease pace. Patient fatigues quickly and requires cuing from therapist for rest breaks. Stair training up/down four 6" steps with min-mod A and step-to pattern using L rail and R HHA. In parallel bars, performed seated/standing BLE therex for strengthening with LUE support: seated LAQ with 3 sec hold, seated marching, seated hip adduction with ball squeeze, seated hip abduction against manual resistance with 3 sec hold, standing knee flexion, standing heel raises, sit <> stand x 5, standing hip abduction, each exercise x 10 each LE or to fatigue. In standing with LUE support, patient kicked ball back to therapist x 10 each LE. Patient demonstrates significant memory impairment as she frequently states in the middle of a task, "What am I doing?" or does not know what therapist has asked her to do immediately after therapist explains/demonstrates task. Patient left semi reclined in bed with RUE elevated on pillow, all needs within reach.     Therapy Documentation Precautions:  Precautions Precautions: Fall Precaution Comments:  pacemaker Restrictions Weight Bearing Restrictions: No Pain: Pain Assessment Pain Assessment: No/denies pain  See FIM for current functional status  Therapy/Group: Individual Therapy  Kerney ElbeVarner, [name] 05/17/2014, 3:48 PM

## 2014-05-17 NOTE — Progress Notes (Signed)
Occupational Therapy Session Note  Patient Details  Name: [name] MRN: [id number] Date of Birth: 1918-10-08  Today's Date: 05/17/2014 OT Individual Time: 1030-1200    OT Individual Time Calculation (min): 90 min    Short Term Goals: Week 1:  OT Short Term Goal 1 (Week 1): Pt will complete bathing at min assist level OT Short Term Goal 2 (Week 1): Pt will complete UB dressing with mod assist and cues for hemi dressing technique OT Short Term Goal 3 (Week 1): Pt will complete LB dressing with mod assist OT Short Term Goal 4 (Week 1): Pt completed bathing and dressing with mod cues for positioning of RUE     Skilled Therapeutic Interventions/Progress Updates:    Addressed transfers, attending to Right, sit to stand, RUE NMRE.  Pt sitting in wc upon OT arrival.  Transferred to from wc to toilet with mod assist and cues for control and hip rotation.   Pt performed stand pivot transfer with mod A. Pt required assistance with clothing management but was able to perform hygiene task. Pt transferred from toilet>wc>shower bench.  Pt performed sit to stand with mod assist and standing balance for 1 minutex3 during  Bathing.  Provided max assist for pt to wait in between activities rather than impulsively move.  Performed dressing in wc with cues for hemi dressing strategies.  Pt. Stood at sink for brushing teeth.  Transitioned to gym.  Transferred to mat and practiced sitting balance with UE activity on left and WBing on Right.  Educated pt on not using trunk for compensation.  Transferred back to wc with min assist and taken back to room   Left with all needs in reach.   .   Therapy Documentation Precautions:  Precautions Precautions: Fall Precaution Comments: pacemaker Restrictions Weight Bearing Restrictions: No   Pain:  none        See FIM for current functional status  Therapy/Group: Individual Therapy  [name], [name] 05/17/2014, 6:49 PM

## 2014-05-17 NOTE — Progress Notes (Addendum)
[facility] PHYSICAL MEDICINE & REHABILITATION     PROGRESS NOTE    Subjective/Complaints: Seems to have had a good night. Participated in all therapies yesterday.  No n/v/d/cp/sob. No back/leg pain.  Objective: Vital Signs: Blood pressure 137/59, pulse 60, temperature 98.7 F (37.1 C), temperature source Oral, resp. rate 17, SpO2 97 %. Ct Head Wo Contrast  05/15/2014   CLINICAL DATA:  Increased confusion and slurred speech. Fall last night striking the right frontal forehead. History of stroke. Intracranial hemorrhage.  EXAM: CT HEAD WITHOUT CONTRAST  TECHNIQUE: Contiguous axial images were obtained from the base of the skull through the vertex without intravenous contrast.  COMPARISON:  05/10/2014  FINDINGS: 2.3 by 2.2 cm left frontal lobe parenchymal hematoma, stable from 05/10/2014, with a small amount of surrounding vasogenic edema.  Increased conspicuity of the left peri-Rolandic cortex infarct, with stable general or subarachnoid hemorrhage at the left cerebral vertex. Mildly reduced conspicuity of the right posterior vertex subarachnoid hemorrhage.  No new or increasing hemorrhage identified. No hydrocephalus or midline shift.  Tiny remote lacunar infarct in the head of the left caudate nucleus. Periventricular white matter and corona radiata hypodensities favor chronic ischemic microvascular white matter disease.  Mild chronic ethmoid sinusitis. There is atherosclerotic calcification of the cavernous carotid arteries bilaterally.  IMPRESSION: 1. Stable intracranial hemorrhage without progression or new hemorrhage. Slightly increased conspicuity of the left peri-Rolandic cortex infarct. 2. No hydrocephalus, midline shift, or new significant findings. 3. Periventricular white matter and corona radiata hypodensities favor chronic ischemic microvascular white matter disease. 4. Mild chronic ethmoid sinusitis.   Electronically Signed   By: Gaylyn RongWalter  Liebkemann M.D.   On: 05/15/2014 15:43    Recent  Labs  05/14/14 1420 05/17/14 0733  WBC 10.6* 8.2  HGB 14.6 14.4  HCT 43.2 43.2  PLT 273 293    Recent Labs  05/15/14 1100 05/16/14 0605  NA 136 137  K 4.5 4.1  CL 104 102  GLUCOSE 194* 127*  BUN 26* 19  CREATININE 1.45* 1.44*  CALCIUM 9.1 9.1   CBG (last 3)   Recent Labs  05/16/14 1643 05/16/14 2111 05/17/14 0638  GLUCAP 140* 139* 175*    Wt Readings from Last 3 Encounters:  05/13/14 54.7 kg (120 lb 9.5 oz)  10/24/13 58.06 kg (128 lb)  10/16/13 58.423 kg (128 lb 12.8 oz)    Physical Exam:  Gen: pt pleasant, alert, looks younger than stated age HENT: oral mucosa pink, dentition good Eyes: EOM are normal.  Neck: Normal range of motion. Neck supple. No thyromegaly present.  Cardiovascular:  Cardiac rate controlled without murmur Respiratory: Effort normal and breath sounds normal. No respiratory distress.no wheezes, rales  GI: Soft. Bowel sounds are normal. She exhibits no distension. No tenderness Neurological: She is alert and oriented to place and reason she's here. Right central 7 and mild tongue deviation. She is hard of hearing bilaterally but still is able to converse without additional effort or volume. She followed all simple commands. There wasa Occasionally delay in processing and language , but this appears much improved from prior notes in chart. RUE: 2- to 2/5 deltoid, biceps, triceps, trace to 0/5 wrist and hand. RLE: + to 3- hf, ke, and adfl/apf. Sensation appeared to be grossly intact to light touch and pain in right upper, lower, and face.   Right tone: DTR's 3+, toes up. foot is hypersentitive toutouch. 5 minus/5 in the left deltoid, biceps, triceps, grip, hip flexor, knee extensor, ankle dorsi flexors and plantar  flexion Psych: pleasant, confused, redirectable     Assessment/Plan: 1. Functional deficits secondary to embolic left MCA infarct with hemorrhagic conversion which require 3+ hours per day of interdisciplinary therapy in a  comprehensive inpatient rehab setting. Physiatrist is providing close team supervision and 24 hour management of active medical problems listed below. Physiatrist and rehab team continue to assess barriers to discharge/monitor patient progress toward functional and medical goals. FIM: FIM - Bathing Bathing Steps Patient Completed: Chest, Right Arm, Abdomen, Front perineal area, Right upper leg, Left upper leg, Right lower leg (including foot), Left lower leg (including foot), Buttocks Bathing: 4: Min-Patient completes 8-9 26f 10 parts or 75+ percent  FIM - Upper Body Dressing/Undressing Upper body dressing/undressing steps patient completed: Thread/unthread left bra strap, Thread/unthread right sleeve of pullover shirt/dresss, Put head through opening of pull over shirt/dress Upper body dressing/undressing: 2: Max-Patient completed 25-49% of tasks FIM - Lower Body Dressing/Undressing Lower body dressing/undressing steps patient completed: Thread/unthread right underwear leg, Thread/unthread left underwear leg, Thread/unthread left pants leg, Thread/unthread right pants leg, Don/Doff left shoe, Don/Doff right shoe Lower body dressing/undressing: 2: Max-Patient completed 25-49% of tasks  FIM - Toileting Toileting steps completed by patient: Performs perineal hygiene Toileting: 2: Max-Patient completed 1 of 3 steps  FIM - Diplomatic Services operational officer Devices: Grab bars Toilet Transfers: 4-To toilet/BSC: Min A (steadying Pt. > 75%), 4-From toilet/BSC: Min A (steadying Pt. > 75%)  FIM - Bed/Chair Transfer Bed/Chair Transfer Assistive Devices: Arm rests Bed/Chair Transfer: 5: Supine > Sit: Supervision (verbal cues/safety issues), 4: Bed > Chair or W/C: Min A (steadying Pt. > 75%)  FIM - Locomotion: Wheelchair Distance: 150 Locomotion: Wheelchair: 2: Travels 50 - 149 ft with minimal assistance (Pt.>75%) FIM - Locomotion: Ambulation Locomotion: Ambulation Assistive Devices: Other  (comment), Walker - Rolling, Orthosis (hand orthosis) Ambulation/Gait Assistance: 4: Min assist Locomotion: Ambulation: 1: Travels less than 50 ft with minimal assistance (Pt.>75%)  Comprehension Comprehension Mode: Auditory Comprehension: 5-Follows basic conversation/direction: With no assist  Expression Expression Mode: Verbal Expression: 4-Expresses basic 75 - 89% of the time/requires cueing 10 - 24% of the time. Needs helper to occlude trach/needs to repeat words.  Social Interaction Social Interaction: 6-Interacts appropriately with others with medication or extra time (anti-anxiety, antidepressant).  Problem Solving Problem Solving: 3-Solves basic 50 - 74% of the time/requires cueing 25 - 49% of the time  Memory Memory: 3-Recognizes or recalls 50 - 74% of the time/requires cueing 25 - 49% of the time  Medical Problem List and Plan: 1. Functional deficits secondary to cardio-embolic, left MCA branch infarct with hemorrhagic transformation after TPA  -CT negative for acute changes today---observe neuro status/arousal today---likely volume related 2. DVT Prophylaxis/Anticoagulation: SCDs. Monitor for any signs of DVT 3. Pain Management: Tylenol as needed---minimal pain at present 4. Mood/history of memory loss/mild dementia: Aricept 5 mg daily at bedtime  -appears to be having some sundowning in hospital  -exacerbated by cva   -fall risk-minimize fall risk factors as possible 5. Neuropsych: This patient is not capable of making decisions on her own behalf. 6. Skin/Wound Care: Routine skin checks 7. Fluids/Electrolytes/Nutrition:   Encouraging po fluids.   -see below 8. Dysphagia/a fascia. Dysphagia 2 thin liquids. advance per speech therapy 9. Hypertension/atrial fibrillation/pacemaker. Cardizem CD 120 mg daily,Multaq 400 mg twice a day, cardiac rate control 10.Bilat carotid Stenosis. VVS to see to help determine potential surgical needs moving forward 11. Hypothyroidism.  Synthroid 12. Diabetes mellitus of peripheral neuropathy. Hemoglobin A1c 7.5. Sliding scale insulin.   -  sugars generally acceptable at present 13. Chronic renal insufficiency/mild acute renal insufficiency. Baseline creatinine 1.36  -volume depleted ----improved after IVF  -continue to encourage PO  -follow labs serially 14. CAD/NSTEMI/angioplasty. Continue aspirin. No chest pain or shortness of breath 15. Hyperlipidemia. Zetia, Crestor 16. Low grade temp, leukocytosis: urine with odor---ucx negative  -follow up labs pending  LOS (Days) 4 A FACE TO FACE EVALUATION WAS PERFORMED  SWARTZ,ZACHARY T 05/17/2014 8:36 AM

## 2014-05-18 ENCOUNTER — Inpatient Hospital Stay (HOSPITAL_COMMUNITY): Payer: Medicare Other | Admitting: Speech Pathology

## 2014-05-18 ENCOUNTER — Inpatient Hospital Stay (HOSPITAL_COMMUNITY): Payer: Medicare Other | Admitting: Occupational Therapy

## 2014-05-18 ENCOUNTER — Inpatient Hospital Stay (HOSPITAL_COMMUNITY): Payer: Medicare Other | Admitting: Physical Therapy

## 2014-05-18 DIAGNOSIS — N179 Acute kidney failure, unspecified: Secondary | ICD-10-CM

## 2014-05-18 DIAGNOSIS — N183 Chronic kidney disease, stage 3 (moderate): Secondary | ICD-10-CM

## 2014-05-18 LAB — GLUCOSE, CAPILLARY
GLUCOSE-CAPILLARY: 169 mg/dL — AB (ref 70–99)
GLUCOSE-CAPILLARY: 146 mg/dL — AB (ref 70–99)
Glucose-Capillary: 141 mg/dL — ABNORMAL HIGH (ref 70–99)
Glucose-Capillary: 119 mg/dL — ABNORMAL HIGH (ref 70–99)

## 2014-05-18 MED ORDER — SODIUM CHLORIDE 0.45 % IV SOLN
INTRAVENOUS | Status: DC
  Administered 2014-05-18 – 2014-05-25 (×6): via INTRAVENOUS

## 2014-05-18 NOTE — Progress Notes (Signed)
Occupational Therapy Session Note  Patient Details  Name: [name] MRN: [id number] Date of Birth: 09-Nov-1918  Today's Date: 05/18/2014 OT Individual Time: 0800-0900 and 1300-1334 OT Individual Time Calculation (min): 60 min and 34 min   Short Term Goals: Week 1:  OT Short Term Goal 1 (Week 1): Pt will complete bathing at min assist level OT Short Term Goal 2 (Week 1): Pt will complete UB dressing with mod assist and cues for hemi dressing technique OT Short Term Goal 3 (Week 1): Pt will complete LB dressing with mod assist OT Short Term Goal 4 (Week 1): Pt completed bathing and dressing with mod cues for positioning of RUE     Skilled Therapeutic Interventions/Progress Updates:    Visit 1: no c/o pain.   Pt seen for BADL retraining of toileting, bathing, and dressing with a focus on use of RUE, dynamic balance, safety awareness. Pt received in bed. Close S supine to sit with HOB elevated and rails. Min stand pivot with all transfers today (bed, toilet, shower) but does need cues to slow down and watch her foot placement. Completed toileting and shower with min A. Used R hand with HOH A to wash L arm and R thigh. Stood with steadying assist to wash bottom. Mod cues to place R arm in bra strap and shirt sleeve. Improved awareness with R leg. Completed grooming at sink. Pt was perseverating on her wet hair and reminded pt 8x that I would bring her a blow dryer. Pt's hair dried at end of session. Pt  in w/c with call light, lap tray, and quick release belt.  Visit 2: no c/o pain Pt had received lunch late, NT providing S. OT session began with OT providing S and min cues to clear food off R side of mouth 3x. Pt taken to therapy and transferred to mat.  Worked on [employer] reed with facilitation of shoulder with UE RANGER with mod cues to attend to R side. Grasping and releasing cones with mod A to stabilize wrist. Pt continually tried to use L hand to assist R and had to have pt "sit" on her  L hand.  Pt concerned about therapy being so late in the day and anxious to go back to her room. Reminded pt that it was only 1:15pm.  Mod encouragement to continue exercises. Pt did complete and returned to room in w/c with all needs met, quick release belt on.  Therapy Documentation Precautions:  Precautions Precautions: Fall Precaution Comments: pacemaker Restrictions Weight Bearing Restrictions: No    Vital Signs: Therapy Vitals Temp: 98.7 F (37.1 C) Temp Source: Oral Pulse Rate: 60 Resp: 17 BP: (!) 144/56 mmHg Patient Position (if appropriate): Lying Oxygen Therapy SpO2: 95 % O2 Device: Not Delivered    ADL:  See FIM for current functional status  Therapy/Group: Individual Therapy  SAGUIER,JULIA 05/18/2014, 7:58 AM

## 2014-05-18 NOTE — Progress Notes (Signed)
[place] PHYSICAL MEDICINE & REHABILITATION     PROGRESS NOTE    Subjective/Complaints: Alert and pleasant, had therapy this morning, not oriented to time, cued to use of calendar. Did not remember me from a few days ago. No n/v/d/cp/sob. No back/leg pain.  Objective: Vital Signs: Blood pressure 144/56, pulse 60, temperature 98.7 F (37.1 C), temperature source Oral, resp. rate 17, SpO2 95 %. No results found.  Recent Labs  05/17/14 0733  WBC 8.2  HGB 14.4  HCT 43.2  PLT 293    Recent Labs  05/16/14 0605 05/17/14 0733  NA 137 136  K 4.1 4.1  CL 102 104  GLUCOSE 127* 168*  BUN 19 18  CREATININE 1.44* 1.40*  CALCIUM 9.1 9.2   CBG (last 3)   Recent Labs  05/17/14 1658 05/17/14 2136 05/18/14 0646  GLUCAP 128* 133* 141*    Wt Readings from Last 3 Encounters:  05/13/14 54.7 kg (120 lb 9.5 oz)  10/24/13 58.06 kg (128 lb)  10/16/13 58.423 kg (128 lb 12.8 oz)    Physical Exam:  Gen: pt pleasant, alert, looks younger than stated age HENT: oral mucosa pink, dentition good Eyes: EOM are normal.  Neck: Normal range of motion. Neck supple. No thyromegaly present.  Cardiovascular:  Cardiac rate controlled without murmur Respiratory: Effort normal and breath sounds normal. No respiratory distress.no wheezes, rales  GI: Soft. Bowel sounds are normal. She exhibits no distension. No tenderness Neurological: She is alert and oriented to place and reason she's here. Right central 7 and mild tongue deviation. She is hard of hearing bilaterally but still is able to converse without additional effort or volume. She followed all simple commands. There wasa Occasionally delay in processing and language , but this appears much improved from prior notes in chart. RUE: 2- to 2/5 deltoid, biceps, triceps, trace to 0/5 wrist and hand. RLE: + to 3- hf, ke, and adfl/apf. Sensation appeared to be grossly intact to light touch and pain in right upper, lower, and face.   Right tone:  DTR's 3+, toes up. foot is hypersentitive toutouch. 5 minus/5 in the left deltoid, biceps, triceps, grip, hip flexor, knee extensor, ankle dorsi flexors and plantar flexion Psych: pleasant, confused, redirectable     Assessment/Plan: 1. 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 Physiatrist is providing close team supervision and 24 hour management of active medical problems listed below. Physiatrist and rehab team continue to assess barriers to discharge/monitor patient progress toward functional and medical goals. FIM: FIM - Bathing Bathing Steps Patient Completed: Chest, Right Arm, Abdomen, Front perineal area, Right upper leg, Left upper leg, Right lower leg (including foot), Left lower leg (including foot), Buttocks Bathing: 4: Min-Patient completes 8-9 41f10 parts or 75+ percent  FIM - Upper Body Dressing/Undressing Upper body dressing/undressing steps patient completed: Thread/unthread right sleeve of pullover shirt/dresss, Put head through opening of pull over shirt/dress Upper body dressing/undressing: 2: Max-Patient completed 25-49% of tasks FIM - Lower Body Dressing/Undressing Lower body dressing/undressing steps patient completed: Thread/unthread right underwear leg, Thread/unthread left underwear leg, Thread/unthread left pants leg, Thread/unthread right pants leg, Don/Doff left shoe, Don/Doff right shoe Lower body dressing/undressing: 1: Total-Patient completed less than 25% of tasks  FIM - Toileting Toileting steps completed by patient: Performs perineal hygiene Toileting Assistive Devices: Grab bar or rail for support Toileting: 2: Max-Patient completed 1 of 3 steps  FIM - Radio producer Devices: Grab bars Toilet Transfers: 4-From toilet/BSC: Min A (steadying Pt. > 75%), 4-To toilet/BSC: Min A  (steadying Pt. > 75%)  FIM - Bed/Chair Transfer Bed/Chair Transfer Assistive Devices: Arm rests Bed/Chair Transfer: 5: Supine > Sit: Supervision (verbal cues/safety issues), 4: Bed > Chair or W/C: Min A (steadying Pt. > 75%)  FIM - Locomotion: Wheelchair Distance: 150 Locomotion: Wheelchair: 1: Total Assistance/staff pushes wheelchair (Pt<25%) FIM - Locomotion: Ambulation Locomotion: Ambulation Assistive Devices: Environmental consultant - Rolling, Orthosis, Other (comment) (HHA) Ambulation/Gait Assistance: 4: Min assist Locomotion: Ambulation: 2: Travels 50 - 149 ft with minimal assistance (Pt.>75%)  Comprehension Comprehension Mode: Auditory Comprehension: 5-Follows basic conversation/direction: With no assist  Expression Expression Mode: Verbal Expression: 5-Expresses basic needs/ideas: With extra time/assistive device  Social Interaction Social Interaction: 6-Interacts appropriately with others with medication or extra time (anti-anxiety, antidepressant).  Problem Solving Problem Solving: 3-Solves basic 50 - 74% of the time/requires cueing 25 - 49% of the time  Memory Memory: 4-Recognizes or recalls 75 - 89% of the time/requires cueing 10 - 24% of the time  Medical Problem List and Plan: 1. Functional deficits secondary to cardio-embolic, left MCA branch infarct with hemorrhagic transformation after TPA  -CT negative for acute changes today---observe neuro status/arousal today---likely volume related 2. DVT Prophylaxis/Anticoagulation: SCDs. Monitor for any signs of DVT 3. Pain Management: Tylenol as needed---minimal pain at present 4. Mood/history of memory loss/mild dementia: Aricept 5 mg daily at bedtime  -appears to be having some sundowning in hospital  -exacerbated by cva   -fall risk-minimize fall risk factors as possible 5. Neuropsych: This patient is not capable of making decisions on her own behalf. 6. Skin/Wound Care: Routine skin checks 7. Fluids/Electrolytes/Nutrition:    Encouraging po fluids.   -see below 8. Dysphagia/a fascia. Dysphagia 2 thin liquids. advance per speech therapy, poor fluid intake, monitor be met as well as I's and O's, may need IV fluid at night 9. Hypertension/atrial fibrillation/pacemaker. Cardizem CD 120 mg daily,Multaq 400 mg twice a day, cardiac rate control 10.Bilat carotid Stenosis. VVS to see to help determine potential surgical needs moving forward 11. Hypothyroidism. Synthroid 12. Diabetes mellitus of peripheral neuropathy. Hemoglobin A1c 7.5. Sliding scale insulin.   -sugars generally acceptable at present 13. Chronic renal insufficiency/mild acute renal insufficiency. Baseline creatinine 1.36  -volume depleted ----improved after IVF  -continue to encourage PO  -follow labs serially 14. CAD/NSTEMI/angioplasty. Continue aspirin. No chest pain or shortness of breath 15. Hyperlipidemia. Zetia, Crestor 16. Low grade temp, leukocytosis: urine with odor---ucx negative   LOS (Days) 5 A FACE TO FACE EVALUATION WAS PERFORMED  Alysia Penna E 05/18/2014 10:57 AM

## 2014-05-18 NOTE — Progress Notes (Signed)
Physical Therapy Session Note  Patient Details  Name: [name] MRN: [id number] Date of Birth: [birth year]  Today's Date: 05/18/2014 PT Individual Time: 1110-1210 PT Individual Time Calculation (min): 60 min   Short Term Goals: Week 1:  PT Short Term Goal 1 (Week 1): = LTGs of overall supervision  Skilled Therapeutic Interventions/Progress Updates:   Pt received in w/c; pt denies pain.  Discussed with pt normal activities at home.  Reports she did have plants that she took care of in her apartment.  Pt willing to assist with plant care in day room.  Use of functional/familiar activity with patient for NMR; see below for details.  Pt required multiple sitting rest breaks due to fatigue and due to pt becoming discouraged due to lack of movement/strength in R side; pt asking each time to return to room.  During each rest break provided max encouragement to patient to continue and verbalized the benefit of therapy for motor return; pt agreeable to continue.  Performed bimanual hand washing task in standing at sink for standing balance without UE support with min A and mod verbal cues to maintain upright trunk at sink.  In gym performed higher level gait training to simulate various obstacles at ILF; pt performed gait x 40' with L HHA while stepping over low obstacles, changes in direction weaving L and R around obstacles, walking across compliant surface with min A and verbal cues for more upright posture and full R foot clearance.  At end of gait pt reporting feeling dizzy.  Vitals assessed with BP noted to be low.  Returned to room in w/c and BP assessed again with it elevating slightly.  Pt RN alerted; pt no longer symptomatic so pt left up in w/c for lunch with quick release belt in place and all items within reach.      Therapy Documentation Precautions:  Precautions Precautions: Fall Precaution Comments: pacemaker Restrictions Weight Bearing Restrictions: No Vital Signs: Therapy  Vitals Pulse Rate: 72 BP: (!) 112/59 mmHg Patient Position (if appropriate): Sitting Oxygen Therapy SpO2: 100 % O2 Device: Not Delivered Pain: Pain Assessment Pain Assessment: No/denies pain Locomotion : Ambulation Ambulation/Gait Assistance: 4: Min assist  Other Treatments: Treatments Neuromuscular Facilitation: Right;Upper Extremity;Activity to increase coordination;Forced use;Activity to increase motor control;Activity to increase timing and sequencing;Activity to increase sustained activation;Activity to increase lateral weight shifting;Activity to increase anterior-posterior weight shifting standing at counter performing R and L lateral stepping along counter for weight shifting.  Also performed plant watering and cleaning counter with towel to focus on WB through and use of RUE in functional tasks.     See FIM for current functional status  Therapy/Group: Individual Therapy  Edman CircleHall, Jaycey Gens [facility] 05/18/2014, 12:19 PM

## 2014-05-18 NOTE — Progress Notes (Signed)
Speech Language Pathology Daily Session Note  Patient Details  Name: [name] MRN: [id number] Date of Birth: [birth year]  Today's Date: 05/18/2014 SLP Individual Time: 1411-1456 SLP Individual Time Calculation (min): 45 min  Short Term Goals: Week 1: SLP Short Term Goal 1 (Week 1): Patient will consume current diet with minimal overt s/s of aspiration with Min A multimodal cues for use of swallowing compensatory straegies. SLP Short Term Goal 2 (Week 1): Patient will demonstrate efficient mastication of Dys. 3 textures evidenced by minimal oral residue without overt s/s of aspiration with Min A multimodal cues.  SLP Short Term Goal 3 (Week 1): Patient will utilize external memory aids to recall new, daily information with Min A multimodal cues.  SLP Short Term Goal 4 (Week 1): Patient will identify 2 cognitive and 2 physical changes with Mod A multimodal cues.  SLP Short Term Goal 5 (Week 1): Patient will utilize word-finding strategies at the phrase level with Mod A multimodal cues.  SLP Short Term Goal 6 (Week 1): Patient will utilize speech intelligibility strategies at the sentence level with supervision verbal cues.   Skilled Therapeutic Interventions: Skilled treatment session focused on cognition goals.  SLP facilitated session by providing min-mod A with anomia; SLP provided phonemic cues and sentence completion.  She required mod A for recalling information regarding therapy schedule.  Patient required verbal cueing to attend to R side when reading the therapy schedule.  SLP facilitated session by providing mod A during convergent tasks using semantic/phonemic cueing.  Continue plan of care.   FIM:  Comprehension Comprehension Mode: Auditory Comprehension: 5-Follows basic conversation/direction: With no assist Expression Expression Mode: Verbal Expression: 5-Expresses basic 90% of the time/requires cueing < 10% of the time. Social Interaction Social Interaction: 6-Interacts  appropriately with others with medication or extra time (anti-anxiety, antidepressant). Problem Solving Problem Solving: 3-Solves basic 50 - 74% of the time/requires cueing 25 - 49% of the time Memory Memory: 3-Recognizes or recalls 50 - 74% of the time/requires cueing 25 - 49% of the time  Pain Pain Assessment Pain Assessment: No/denies pain Pain Score: 0-No pain  Therapy/Group: Individual Therapy  Cranford MonElizabeth Kanoelani Dobies MA CCC-SLP Cranford MonKonrad, Maxfield Gildersleeve A 05/18/2014, 4:57 PM

## 2014-05-19 ENCOUNTER — Inpatient Hospital Stay (HOSPITAL_COMMUNITY): Payer: Medicare Other | Admitting: Occupational Therapy

## 2014-05-19 ENCOUNTER — Inpatient Hospital Stay (HOSPITAL_COMMUNITY): Payer: Medicare Other | Admitting: Physical Therapy

## 2014-05-19 DIAGNOSIS — I69320 Aphasia following cerebral infarction: Secondary | ICD-10-CM

## 2014-05-19 LAB — GLUCOSE, CAPILLARY
GLUCOSE-CAPILLARY: 144 mg/dL — AB (ref 70–99)
GLUCOSE-CAPILLARY: 134 mg/dL — AB (ref 70–99)
Glucose-Capillary: 142 mg/dL — ABNORMAL HIGH (ref 70–99)
Glucose-Capillary: 134 mg/dL — ABNORMAL HIGH (ref 70–99)

## 2014-05-19 NOTE — Progress Notes (Signed)
Physical Therapy Session Note  Patient Details  Name: [name] MRN: [id number] Date of Birth: Oct 06, 1918  Today's Date: 05/19/2014 PT Individual Time: 1000-1100 PT Individual Time Calculation (min): 60 min  Short Term Goals: Week 1:  PT Short Term Goal 1 (Week 1): = LTGs of overall supervision  Skilled Therapeutic Interventions/Progress Updates:  Pt was seen bedside in the am sitting up in w/c. Pt ambulated 40 feet with min A and verbal cues. Pt performed step ups 3 sets x 10 reps each. Pt performed multiple sit to stand transfers with min A and verbal cues. Pt required max verbal cues throughout treatment to maximize participation. Pt attempted checkers to work on Automatic DataMR however quickly fatigued and requested to stop playing and return to room. Pt educated on benefits of therapy. Pt verbalized understanding and continued to participate with max encouragement. Pt ambulated 70, 10 and 60 feet with SBQC and min A with verbal cues. Pt propelled w/c back towards room about 100 feet with min A and max encouragement. Pt left sitting up in w/c with quick release belt in place and call bell within reach.    Therapy Documentation Precautions:  Precautions Precautions: Fall Precaution Comments: pacemaker Restrictions Weight Bearing Restrictions: No General:   Pain: No c/o pain.    Locomotion : Ambulation Ambulation/Gait Assistance: 4: Min assist   See FIM for current functional status  Therapy/Group: Individual Therapy  Rayford HalstedMitchell, Ariyana Faw G 05/19/2014, 12:46 PM

## 2014-05-19 NOTE — Progress Notes (Signed)
[name] PHYSICAL MEDICINE & REHABILITATION     PROGRESS NOTE    Subjective/Complaints: Alert and pleasant, OT assisting with ADLs. Did not remember me  No n/v/d/cp/sob. No back/leg pain.  Objective: Vital Signs: Blood pressure 151/57, pulse 60, temperature 97.7 F (36.5 C), temperature source Oral, resp. rate 18, SpO2 95 %. No results found.  Recent Labs  05/17/14 0733  WBC 8.2  HGB 14.4  HCT 43.2  PLT 293    Recent Labs  05/17/14 0733  NA 136  K 4.1  CL 104  GLUCOSE 168*  BUN 18  CREATININE 1.40*  CALCIUM 9.2   CBG (last 3)   Recent Labs  05/18/14 1659 05/18/14 2046 05/19/14 0640  GLUCAP 146* 119* 142*    Wt Readings from Last 3 Encounters:  05/13/14 54.7 kg (120 lb 9.5 oz)  10/24/13 58.06 kg (128 lb)  10/16/13 58.423 kg (128 lb 12.8 oz)    Physical Exam:  Gen: pt pleasant, alert, looks younger than stated age HENT: oral mucosa pink, dentition good Eyes: EOM are normal.  Neck: Normal range of motion. Neck supple. No thyromegaly present.  Cardiovascular:  Cardiac rate controlled without murmur Respiratory: Effort normal and breath sounds normal. No respiratory distress.no wheezes, rales  GI: Soft. Bowel sounds are normal. She exhibits no distension. No tenderness Neurological: She is alert and oriented to place and reason she's here. Right central 7 and mild tongue deviation. She is hard of hearing bilaterally but still is able to converse without additional effort or volume. She followed all simple commands. There wasa Occasionally delay in processing and language , but this appears much improved from prior notes in chart. RUE: 2- to 2/5 deltoid, biceps, triceps, trace to 0/5 wrist and hand. RLE: + to 3- hf, ke, and adfl/apf. Sensation appeared to be grossly intact to light touch and pain in right upper, lower, and face.   Right tone: DTR's 3+, toes up. foot is hypersentitive toutouch. 5 minus/5 in the left deltoid, biceps, triceps, grip, hip  flexor, knee extensor, ankle dorsi flexors and plantar flexion Psych: pleasant, confused, redirectable     Assessment/Plan: 1. 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 Physiatrist is providing close team supervision and 24 hour management of active medical problems listed below. Physiatrist and rehab team continue to assess barriers to discharge/monitor patient progress toward functional and medical goals. FIM: FIM - Bathing Bathing Steps Patient Completed: Chest, Right Arm, Abdomen, Front perineal area, Right upper leg, Left upper leg, Right lower leg (including foot), Left lower leg (including foot), Buttocks Bathing: 4: Min-Patient completes 8-9 78f10 parts or 75+ percent  FIM - Upper Body Dressing/Undressing Upper body dressing/undressing steps patient completed: Thread/unthread right bra strap, Thread/unthread left bra strap, Thread/unthread left sleeve of pullover shirt/dress, Put head through opening of pull over shirt/dress, Pull shirt over trunk Upper body dressing/undressing: 3: Mod-Patient completed 50-74% of tasks FIM - Lower Body Dressing/Undressing Lower body dressing/undressing steps patient completed: Thread/unthread right underwear leg, Thread/unthread right pants leg, Thread/unthread left pants leg, Don/Doff left shoe Lower body dressing/undressing: 2: Max-Patient completed 25-49% of tasks  FIM - Toileting Toileting steps completed by patient: Adjust clothing prior to toileting, Performs perineal hygiene Toileting Assistive Devices: Grab bar or rail for support Toileting: 3: Mod-Patient completed 2 of 3 steps  FIM - TRadio producerDevices: Grab bars Toilet Transfers: 4-To toilet/BSC:  Min A (steadying Pt. > 75%), 4-From toilet/BSC: Min A (steadying Pt. > 75%)  FIM - Bed/Chair Transfer Bed/Chair  Transfer Assistive Devices: Arm rests Bed/Chair Transfer: 4: Bed > Chair or W/C: Min A (steadying Pt. > 75%), 4: Chair or W/C > Bed: Min A (steadying Pt. > 75%)  FIM - Locomotion: Wheelchair Distance: 150 Locomotion: Wheelchair: 1: Total Assistance/staff pushes wheelchair (Pt<25%) FIM - Locomotion: Ambulation Locomotion: Ambulation Assistive Devices: Other (comment) (HHA) Ambulation/Gait Assistance: 4: Min assist Locomotion: Ambulation: 1: Travels less than 50 ft with minimal assistance (Pt.>75%)  Comprehension Comprehension Mode: Auditory Comprehension: 5-Follows basic conversation/direction: With no assist  Expression Expression Mode: Verbal Expression: 5-Expresses basic 90% of the time/requires cueing < 10% of the time.  Social Interaction Social Interaction: 6-Interacts appropriately with others with medication or extra time (anti-anxiety, antidepressant).  Problem Solving Problem Solving: 3-Solves basic 50 - 74% of the time/requires cueing 25 - 49% of the time  Memory Memory: 3-Recognizes or recalls 50 - 74% of the time/requires cueing 25 - 49% of the time  Medical Problem List and Plan: 1. Functional deficits secondary to cardio-embolic, left MCA branch infarct with hemorrhagic transformation after TPA  -CT negative for acute changes today---observe neuro status/arousal today---likely volume related 2. DVT Prophylaxis/Anticoagulation: SCDs. Monitor for any signs of DVT 3. Pain Management: Tylenol as needed---minimal pain at present 4. Mood/history of memory loss/mild dementia: Aricept 5 mg daily at bedtime  -appears to be having some sundowning in hospital  -exacerbated by cva   -fall risk-minimize fall risk factors as possible 5. Neuropsych: This patient is not capable of making decisions on her own behalf. 6. Skin/Wound Care: Routine skin checks 7. Fluids/Electrolytes/Nutrition:   Encouraging po fluids.   -see below 8. Dysphagia/a fascia. Dysphagia 2 thin liquids.  advance per speech therapy, poor fluid intake, monitor be met as well as I's and O's, may need IV fluid at night 9. Hypertension/atrial fibrillation/pacemaker. Cardizem CD 120 mg daily,Multaq 400 mg twice a day, cardiac rate control 10.Bilat carotid Stenosis. VVS to see to help determine potential surgical needs moving forward 11. Hypothyroidism. Synthroid 12. Diabetes mellitus of peripheral neuropathy. Hemoglobin A1c 7.5. Sliding scale insulin.   -sugars generally acceptable at present 13. Chronic renal insufficiency/mild acute renal insufficiency. Baseline creatinine 1.36  -volume depleted ----poor intake of meals and fluid, cont IVF 20m/hr x 12 during the night if po fluid intake < 10075mduring the day  -continue to encourage PO  -follow labs serially 14. CAD/NSTEMI/angioplasty. Continue aspirin. No chest pain or shortness of breath 15. Hyperlipidemia. Zetia, Crestor    LOS (Days) 6 A FACE TO FACE EVALUATION WAS PERFORMED  [name] 05/19/2014 10:28 AM

## 2014-05-19 NOTE — Progress Notes (Signed)
Occupational Therapy Session Note  Patient Details  Name: [name] MRN: [id number] Date of Birth: 1918-08-10  Today's Date: 05/19/2014 OT Individual Time: 4098-11910830-0930 OT Individual Time Calculation (min): 60 min    Short Term Goals: Week 1:  OT Short Term Goal 1 (Week 1): Pt will complete bathing at min assist level OT Short Term Goal 2 (Week 1): Pt will complete UB dressing with mod assist and cues for hemi dressing technique OT Short Term Goal 3 (Week 1): Pt will complete LB dressing with mod assist OT Short Term Goal 4 (Week 1): Pt completed bathing and dressing with mod cues for positioning of RUE  Skilled Therapeutic Interventions/Progress Updates:    Engaged in ADL retraining with focus on attention to RUE, standing balance, and functional transfers.  Pt in bed upon arrival, completed stand pivot transfer bed > w/c with min assist.  Toilet transfer stand pivot with use of grab bars and min assist with pt able to complete 2/3 toileting steps with steady assist in standing.  Grooming completed in standing at sink with setup assist to obtain items and apply toothpaste and cues to place RUE on sink in WB position.  Bathing completed in room shower with cues for attention to RUE and cues to attempt tasks prior to asking for assistance.  Pt with difficulty threading Lt pant leg, but with good carryover of donning weak RLE first.  Pt with Rt/Lt confusion in UE when cued for hemi-dressing technique.  Pt left seated in w/c with quick release belt and all needs in reach.  Therapy Documentation Precautions:  Precautions Precautions: Fall Precaution Comments: pacemaker Restrictions Weight Bearing Restrictions: No Pain:  Pt with no c/o pain  See FIM for current functional status  Therapy/Group: Individual Therapy  Rosalio LoudHOXIE, Tong Pieczynski 05/19/2014, 9:31 AM

## 2014-05-19 NOTE — Progress Notes (Signed)
Occupational Therapy Session Note  Patient Details  Name: [name] MRN: [id number] Date of Birth: 1918-02-08  Today's Date: 05/19/2014 OT Individual Time: 1300-1400 OT Individual Time Calculation (min): 60 min    Short Term Goals: Week 1:  OT Short Term Goal 1 (Week 1): Pt will complete bathing at min assist level OT Short Term Goal 2 (Week 1): Pt will complete UB dressing with mod assist and cues for hemi dressing technique OT Short Term Goal 3 (Week 1): Pt will complete LB dressing with mod assist OT Short Term Goal 4 (Week 1): Pt completed bathing and dressing with mod cues for positioning of RUE  Skilled Therapeutic Interventions/Progress Updates:    Treatment session with focus on standing balance, RUE NMR, and increased attention to RUE.  Pt received upright in w/c.  In therapy gym engaged in RUE NMR with use of UE Ranger to facilitate shoulder flexion and elbow flexion/extension.  Pt required tactile cues at Rt elbow to decrease gravity and mod-max cues to attend to Rt side and not use Lt hand to assist.  Attempted to use Rt hand to grasp rings with decreased success.  Pt reports feeling wet, therefore returned to room and completed toilet transfer with min assist.  Pt finished voiding on toilet and changed to clean pants and underwear.  Encouraged pt to attempt LB dressing prior to requesting assist and reminded of OT goals.  Pt with able to thread BLE through underwear and pants this session, but continues to require assist to pull over hips.  Engaged in [employer] card game in standing with focus on standing balance and attention to Rt side.  Therapy Documentation Precautions:  Precautions Precautions: Fall Precaution Comments: pacemaker Restrictions Weight Bearing Restrictions: No Pain:  Pt with no c/o pain  See FIM for current functional status  Therapy/Group: Individual Therapy  Rosalio LoudHOXIE, Ihan Pat 05/19/2014, 3:13 PM

## 2014-05-19 NOTE — Progress Notes (Signed)
Physical Therapy Session Note  Patient Details  Name: [name] MRN: [id number] Date of Birth: 12/02/1918  Today's Date: 05/19/2014 PT Individual Time: 1400-1430 PT Individual Time Calculation (min): 30 min   Short Term Goals: Week 1:  PT Short Term Goal 1 (Week 1): = LTGs of overall supervision  Skilled Therapeutic Interventions/Progress Updates:  Pt was seen bedside in the pm. Pt performed LE exercises in w/c 3 sets x10 reps each. Pt performed multiple sit to stand transfers with min A and verbal cues. Pt ambulated 50 feet with rolling walker, R hand orthosis and min to mod A with verbal cues. Pt ambulated 60 feet with min A hand hold and verbal cues. Pt transferred edge of bed to supine with S, side rail and increased time. Pt left sitting up in bed with bed alarm on and call bell within reach.   Therapy Documentation Precautions:  Precautions Precautions: Fall Precaution Comments: pacemaker Restrictions Weight Bearing Restrictions: No General:   Pain: No c/o pain.    Locomotion : Ambulation Ambulation/Gait Assistance: 4: Min assist   See FIM for current functional status  Therapy/Group: Individual Therapy  Rayford HalstedMitchell, Arista Kettlewell G 05/19/2014, 2:43 PM

## 2014-05-20 ENCOUNTER — Inpatient Hospital Stay (HOSPITAL_COMMUNITY): Payer: Medicare Other

## 2014-05-20 ENCOUNTER — Inpatient Hospital Stay (HOSPITAL_COMMUNITY): Payer: Medicare Other | Admitting: Speech Pathology

## 2014-05-20 ENCOUNTER — Inpatient Hospital Stay (HOSPITAL_COMMUNITY): Payer: Medicare Other | Admitting: Physical Therapy

## 2014-05-20 LAB — GLUCOSE, CAPILLARY
GLUCOSE-CAPILLARY: 146 mg/dL — AB (ref 70–99)
GLUCOSE-CAPILLARY: 151 mg/dL — AB (ref 70–99)
Glucose-Capillary: 197 mg/dL — ABNORMAL HIGH (ref 70–99)
Glucose-Capillary: 151 mg/dL — ABNORMAL HIGH (ref 70–99)

## 2014-05-20 MED ORDER — MEGESTROL ACETATE 40 MG/ML PO SUSP
400.0000 mg | Freq: Every day | ORAL | Status: DC
  Administered 2014-05-20 – 2014-05-27 (×8): 400 mg via ORAL
  Filled 2014-05-20 (×9): qty 10

## 2014-05-20 NOTE — Progress Notes (Signed)
[facility]IC[facility] & REHABILITATION     PROGRESS NOTE    Subjective/Complaints: Feeling well. "i'm ready to go home"  No n/v/d/cp/sob. No back/leg pain.  Objective: Vital Signs: Blood pressure 154/74, pulse 77, temperature 98.4 F (36.9 C), temperature source Oral, resp. rate 19, SpO2 95 %. No results found. No results for input(s): WBC, HGB, HCT, PLT in the last 72 hours. No results for input(s): NA, K, CL, GLUCOSE, BUN, CREATININE, CALCIUM in the last 72 hours.  Invalid input(s): CO CBG (last 3)   Recent Labs  05/19/14 1648 05/19/14 2119 05/20/14 0630  GLUCAP 134* 134* 146*    Wt Readings from Last 3 Encounters:  05/13/14 54.7 kg (120 lb 9.5 oz)  10/24/13 58.06 kg (128 lb)  10/16/13 58.423 kg (128 lb 12.8 oz)    Physical Exam:  Gen: pt pleasant, alert, looks younger than stated age HENT: oral mucosa pink, dentition good Eyes: EOM are normal.  Neck: Normal range of motion. Neck supple. No thyromegaly present.  Cardiovascular:  Cardiac rate controlled without murmur Respiratory: Effort normal and breath sounds normal. No respiratory distress.no wheezes, rales  GI: Soft. Bowel sounds are normal. She exhibits no distension. No tenderness Neurological: She is alert and oriented to place and reason she's here. Right central 7 and mild tongue deviation. She is hard of hearing bilaterally but still is able to converse without additional effort or volume. She followed all simple commands. There wasa Occasionally delay in processing and language , but this appears much improved from prior notes in chart. RUE: 2- to 2/5 deltoid, biceps, triceps, trace to 0/5 wrist and hand. RLE: + to 3- hf, ke, and adfl/apf. Sensation appeared to be grossly intact to light touch and pain in right upper, lower, and face.   Right tone: DTR's 3+, toes up. foot is hypersentitive toutouch. 5 minus/5 in the left deltoid, biceps, triceps, grip, hip flexor, knee extensor, ankle dorsi flexors  and plantar flexion Psych: pleasant, confused, redirectable     Assessment/Plan: 1. 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 Physiatrist is providing close team supervision and 24 hour management of active medical problems listed below. Physiatrist and rehab team continue to assess barriers to discharge/monitor patient progress toward functional and medical goals. FIM: FIM - Bathing Bathing Steps Patient Completed: Chest, Right Arm, Abdomen, Front perineal area, Right upper leg, Left upper leg, Right lower leg (including foot), Left lower leg (including foot), Buttocks Bathing: 4: Min-Patient completes 8-9 68f10 parts or 75+ percent  FIM - Upper Body Dressing/Undressing Upper body dressing/undressing steps patient completed: Thread/unthread right bra strap, Thread/unthread left bra strap, Thread/unthread left sleeve of pullover shirt/dress, Put head through opening of pull over shirt/dress, Pull shirt over trunk Upper body dressing/undressing: 3: Mod-Patient completed 50-74% of tasks FIM - Lower Body Dressing/Undressing Lower body dressing/undressing steps patient completed: Thread/unthread right underwear leg, Thread/unthread right pants leg, Thread/unthread left pants leg, Don/Doff left shoe Lower body dressing/undressing: 2: Max-Patient completed 25-49% of tasks  FIM - Toileting Toileting steps completed by patient: Adjust clothing prior to toileting, Performs perineal hygiene Toileting Assistive Devices: Grab bar or rail for support Toileting: 3: Mod-Patient completed 2 of 3 steps  FIM - TRadio producerDevices: Grab bars Toilet Transfers: 4-To toilet/BSC: Min A (steadying Pt. > 75%), 4-From toilet/BSC: Min A (steadying Pt. > 75%)  FIM - Bed/Chair Transfer Bed/Chair  Transfer Assistive Devices: Arm  rests Bed/Chair Transfer: 4: Bed > Chair or W/C: Min A (steadying Pt. > 75%), 4: Chair or W/C > Bed: Min A (steadying Pt. > 75%)  FIM - Locomotion: Wheelchair Distance: 150 Locomotion: Wheelchair: 2: Travels 50 - 149 ft with minimal assistance (Pt.>75%) FIM - Locomotion: Ambulation Locomotion: Ambulation Assistive Devices: Nurse, adult Ambulation/Gait Assistance: 4: Min assist Locomotion: Ambulation: 2: Travels 50 - 149 ft with minimal assistance (Pt.>75%)  Comprehension Comprehension Mode: Auditory Comprehension: 5-Follows basic conversation/direction: With no assist  Expression Expression Mode: Verbal Expression: 5-Expresses complex 90% of the time/cues < 10% of the time  Social Interaction Social Interaction: 6-Interacts appropriately with others with medication or extra time (anti-anxiety, antidepressant).  Problem Solving Problem Solving: 3-Solves basic 50 - 74% of the time/requires cueing 25 - 49% of the time  Memory Memory: 3-Recognizes or recalls 50 - 74% of the time/requires cueing 25 - 49% of the time  Medical Problem List and Plan: 1. Functional deficits secondary to cardio-embolic, left MCA branch infarct with hemorrhagic transformation after TPA  -CT negative for acute changes today---observe neuro status/arousal today---likely volume related 2. DVT Prophylaxis/Anticoagulation: SCDs. Monitor for any signs of DVT 3. Pain Management: Tylenol as needed---minimal pain at present 4. Mood/history of memory loss/mild dementia: Aricept 5 mg daily at bedtime  -appears to be having some sundowning in hospital  -exacerbated by cva   -fall risk-minimize fall risk factors as possible 5. Neuropsych: This patient is not capable of making decisions on her own behalf. 6. Skin/Wound Care: Routine skin checks 7. Fluids/Electrolytes/Nutrition:   Encouraging po fluids.   -see below 8. Dysphagia/a fascia. Dysphagia 2 thin liquids. advance per speech therapy, poor fluid intake, monitor  be met as well as I's and O's, may need IV fluid at night 9. Hypertension/atrial fibrillation/pacemaker. Cardizem CD 120 mg daily,Multaq 400 mg twice a day, cardiac rate control 10.Bilat carotid Stenosis. VVS to see to help determine potential surgical needs moving forward 11. Hypothyroidism. Synthroid 12. Diabetes mellitus of peripheral neuropathy. Hemoglobin A1c 7.5. Sliding scale insulin.   -sugars generally acceptable at present 13. Chronic renal insufficiency/mild acute renal insufficiency. Baseline creatinine 1.36  -volume depleted ----poor intake of meals and fluid, cont IVF 54m/hr x 12 during the night if po fluid intake < 10063mduring the day  -continue to encourage PO  -add megace  -follow labs serially 14. CAD/NSTEMI/angioplasty. Continue aspirin. No chest pain or shortness of breath 15. Hyperlipidemia. Zetia, Crestor    LOS (Days) 7 A FACE TO FACE EVALUATION WAS PERFORMED  SWARTZ,ZACHARY T 05/20/2014 8:33 AM

## 2014-05-20 NOTE — Progress Notes (Signed)
Occupational Therapy Note  Patient Details  Name: [name] MRN: [id number] Date of Birth: 1918/08/19  Today's Date: 05/20/2014 OT Individual Time: 1130-1200 OT Individual Time Calculation (min): 30 min   Pt denied pain Individual therapy  Pt engaged in RUE NMR seated EOM.  Pt engaged in RUE AAROM with UE Ranger with focus on shoulder flexion/extension and abduction/adduction with support at elbow to reduce gravity.  Pt required max verbal cues to maintain erect posture throughout tasks.  Pt required max verbal cues to elicit isolated muscle recruitment for tasks.  Pt also engaged in RUE elbow flexion/extension tasks with support at elbow and writs. Pt exhibits greater elbow extension than flexion but requires assistance to complete tasks.  Pt returned to w/c with QRB in place and returned to room.   [name], [name] [facility] CtrChappell 05/20/2014, 12:06 PM

## 2014-05-20 NOTE — Plan of Care (Signed)
Problem: RH PAIN MANAGEMENT Goal: RH STG PAIN MANAGED AT OR BELOW PT'S PAIN GOAL <3 Outcome: Progressing No c/o pain     

## 2014-05-20 NOTE — Progress Notes (Signed)
Diet order dc per protocol. Wrong physician assigned to order. Dr Pearlean BrownieSethi is the ordering physician.

## 2014-05-20 NOTE — Progress Notes (Signed)
Occupational Therapy Session Note  Patient Details  Name: [name] MRN: [id number] Date of Birth: 1918/12/27  Today's Date: 05/20/2014 OT Individual Time: 0800-0900 OT Individual Time Calculation (min): 60 min    Short Term Goals: Week 1:  OT Short Term Goal 1 (Week 1): Pt will complete bathing at min assist level OT Short Term Goal 2 (Week 1): Pt will complete UB dressing with mod assist and cues for hemi dressing technique OT Short Term Goal 3 (Week 1): Pt will complete LB dressing with mod assist OT Short Term Goal 4 (Week 1): Pt completed bathing and dressing with mod cues for positioning of RUE  Skilled Therapeutic Interventions/Progress Updates:    Pt engaged in BADL retraining including bathing and dressing with sit<>stand from w/c at sink. Pt declined shower this morning.  Pt performed stand pivot transfer w/c<>toilet using grab.  Pt required steady A to perform hygiene.  Pt required HOH assistance to engage RUE in bathing and dressing tasks.  Pt required max verbal cues to attend to RUE when not engaged in functional task.  Pt required max encouragement to attempt bathing and dressing tasks vs asking for assistance.  Pt requested female OT to assist with BADLs in future sessions.  Focus on activity tolerance, functional transfers, sit<>stand, standing balance, increased RUE use, attention to RUE, and safety awareness.  Therapy Documentation Precautions:  Precautions Precautions: Fall Precaution Comments: pacemaker Restrictions Weight Bearing Restrictions: No  Pain: Pain Assessment Pain Assessment: No/denies pain  See FIM for current functional status  Therapy/Group: Individual Therapy  Rich BraveLanier, Dorthey Depace [name] 05/20/2014, 9:02 AM

## 2014-05-20 NOTE — Plan of Care (Signed)
Problem: RH Ambulation Goal: LTG Patient will ambulate in community environment (PT) LTG: Patient will ambulate in community environment, # of feet with assistance (PT).  Outcome: Not Applicable Date Met:  35/33/17 Goal d/c due to slow patient progress and poor endurance limiting community ambulation.

## 2014-05-20 NOTE — Progress Notes (Signed)
Speech Language Pathology Weekly Progress and Session Note  Patient Details  Name: [name] MRN: [id number] Date of Birth: 06/12/1918  Beginning of progress report period: May 13, 2014 End of progress report period: May 20, 2014  Today's Date: 05/20/2014 SLP Individual Time: 1345-1445 SLP Individual Time Calculation (min): 60 min  Short Term Goals: Week 1: SLP Short Term Goal 1 (Week 1): Patient will consume current diet with minimal overt s/s of aspiration with Min A multimodal cues for use of swallowing compensatory straegies. SLP Short Term Goal 1 - Progress (Week 1): Not met SLP Short Term Goal 2 (Week 1): Patient will demonstrate efficient mastication of Dys. 3 textures evidenced by minimal oral residue without overt s/s of aspiration with Min A multimodal cues.  SLP Short Term Goal 2 - Progress (Week 1): Not met SLP Short Term Goal 3 (Week 1): Patient will utilize external memory aids to recall new, daily information with Min A multimodal cues.  SLP Short Term Goal 3 - Progress (Week 1): Not met SLP Short Term Goal 4 (Week 1): Patient will identify 2 cognitive and 2 physical changes with Mod A multimodal cues.  SLP Short Term Goal 4 - Progress (Week 1): Met SLP Short Term Goal 5 (Week 1): Patient will utilize word-finding strategies at the phrase level with Mod A multimodal cues.  SLP Short Term Goal 5 - Progress (Week 1): Met SLP Short Term Goal 6 (Week 1): Patient will utilize speech intelligibility strategies at the sentence level with supervision verbal cues.  SLP Short Term Goal 6 - Progress (Week 1): Met    New Short Term Goals: Week 2: SLP Short Term Goal 1 (Week 2): Patient will consume current diet with minimal overt s/s of aspiration with Min A multimodal cues for use of swallowing compensatory straegies. SLP Short Term Goal 2 (Week 2): Patient will demonstrate efficient mastication of Dys. 3 textures evidenced by minimal oral residue without overt s/s of aspiration  with Min A multimodal cues.  SLP Short Term Goal 3 (Week 2): Patient will utilize external memory aids to recall new, daily information with Mod A multimodal cues.  SLP Short Term Goal 4 (Week 2): Patient will identify 2 cognitive and 2 physical changes with Min A multimodal cues.  SLP Short Term Goal 5 (Week 2): Patient will utilize word-finding strategies at the phrase level with Min A multimodal cues.  SLP Short Term Goal 6 (Week 2): Patient will utilize speech intelligibility strategies at the sentence level with Mod I.   Weekly Progress Updates: Patient has made functional gains and has met 3 of 6 STG's this reporting period due to increased word-finding, speech intelligibility and intellectual awareness. Currently, patient is consuming Dys. 2 textures with thin liquids with minimal overt s/s of aspiration but continues to require Mod A verbal cues for use of small bites/sips, a slow rate of self-feeding and to manage right buccal pocketing and anterior spillage. Due to patient's current swallowing function and moderate lingual and labial weakness and ROM, trials of Dys. 3 textures have not been attempted at this time. Patient demonstrates increased speech intelligibility at the sentence level and requires overall supervision verbal cues for utilization of speech strategies and Mod A multimodal cues for use of word-finding strategies at the phrase and sentence level. Patient continues to require overall Max A multimodal cues for recall and carryover of new information and Mod A for functional problem solving for basic and familiar tasks and emergent awareness of errors. Patient/family  education ongoing. Patient would benefit from continued skilled SLP intervention to maximize cognitive and swallowing function and overall functional independence prior to discharge.     Intensity: Minumum of 1-2 x/day, 30 to 90 minutes Frequency: 3 to 5 out of 7 days Duration/Length of Stay:  05/27/14 Treatment/Interventions: Cognitive remediation/compensation;Cueing hierarchy;Dysphagia/aspiration precaution training;Environmental controls;Functional tasks;Internal/external aids;Oral motor exercises;Patient/family education;Speech/Language facilitation;Therapeutic Activities   Daily Session Skilled Therapeutic Interventions: Skilled treatment session focused on speech and cognitive goals. Upon arrival, patient independently asked to use the bathroom. Patient transferred to commode and required Mod-Max A verbal and question cues for safety with task. Patient also required Min-Mod A verbal cues for functional problem solving during self-care task of washing her hands. Patient required Max A question cues to recall speech intelligibility strategies but utilized the strategies at the sentence level with supervision verbal cues. Patient was 100% intelligible during oral reading task but required Mod A question cues to self-monitor and correct errors. Patient also required visual cues for pacing. Patient transferred back to bed at end of session and required Mod A question cues for problem solving with task. Patient left supine in bed with alarm on and all needs within reach. Continue with current plan of care.     FIM:  Comprehension Comprehension Mode: Auditory Comprehension: 5-Follows basic conversation/direction: With extra time/assistive device Expression Expression Mode: Verbal Expression: 5-Expresses basic 90% of the time/requires cueing < 10% of the time. Social Interaction Social Interaction: 5-Interacts appropriately 90% of the time - Needs monitoring or encouragement for participation or interaction. Problem Solving Problem Solving: 3-Solves basic 50 - 74% of the time/requires cueing 25 - 49% of the time Memory Memory: 2-Recognizes or recalls 25 - 49% of the time/requires cueing 51 - 75% of the time FIM - Eating Eating Activity: 5: Supervision/cues Pain No/Denies Pain    Therapy/Group: Individual Therapy  Noboru Bidinger 05/20/2014, 4:39 PM

## 2014-05-20 NOTE — Progress Notes (Addendum)
Physical Therapy Weekly Progress Note  Patient Details  Name: [name] MRN: [id number] Date of Birth: 08/15/1918  Beginning of progress report period: May 14, 2014 End of progress report period: May 20, 2014  Today's Date: 05/20/2014 PT Individual Time: 0930-1030 PT Individual Time Calculation (min): 60 min  Patient has met 1 of 9 long term goals. Patient currently requires min A overall including household distance gait using RW. Patient is most limited by decreased endurance and no carryover of situation, impairments, or mobility strategies to improve safety due to memory impairments. Anticipate patient will require 24/7 supervision/assist through [facility] at discharge.   Patient continues to demonstrate the following deficits: muscle weakness, decreased cardiorespiratory endurance, impaired timing and sequencing, abnormal tone, unbalanced muscle activation, decreased awareness, decreased attention, decreased problem solving, decreased safety awareness, decreased memory and delayed processing. and therefore will continue to benefit from skilled PT intervention to enhance overall performance with activity tolerance, balance, postural control, ability to compensate for deficits, functional use of  right upper extremity and right lower extremity, attention, awareness and coordination.  See Patient's Care Plan for progression toward long term goals.  Patient progressing toward long term goals.  Continue plan of care.  Skilled Therapeutic Interventions/Progress Updates:   Session focused on orientation, patient education, functional mobility training, safety awareness, and R NMR. Patient received sitting in wheelchair, RN departing. Patient able to state she was at [facility] but unaware of being on rehab. She did not recall this therapist (primary PT) or any therapy while on inpatient rehab. Patient required total A at beginning of session for situation and intellectual awareness of physical  impairments. Patient propelled wheelchair using LUE with min-mod A room > gym. Stair training up/down four 6" stairs using L rail and R HHA, min A overall with patient self electing step-to pattern. Gait trialing WBQC with use of R GiveMohr sling, x 5 ft with min-mod A and total step-by-step cues for sequencing. Gait training using RW with R hand orthosis and supervision-min guard, 2 x 50 ft in controlled environment and 2 x 75 ft in controlled/home environments with max multimodal cues for safety with RW and upright posture with forward gaze. In ADL apartment, patient performed bed mobility with supervision, min cues for attending to RUE. Patient fatigues quickly and requires frequent rest breaks throughout session. At end of session, patient still required total A for situation. Patient left sitting in wheelchair with quick release belt on and 1/2 lap tray donned with all needs within reach.   Therapy Documentation Precautions:  Precautions Precautions: Fall Precaution Comments: pacemaker Restrictions Weight Bearing Restrictions: No Pain: Pain Assessment Pain Assessment: No/denies pain  See FIM for current functional status  Therapy/Group: Individual Therapy  Varner,  A 05/20/2014, 10:44 AM   

## 2014-05-21 ENCOUNTER — Inpatient Hospital Stay (HOSPITAL_COMMUNITY): Payer: Medicare Other

## 2014-05-21 ENCOUNTER — Inpatient Hospital Stay (HOSPITAL_COMMUNITY): Payer: Medicare Other | Admitting: Speech Pathology

## 2014-05-21 ENCOUNTER — Inpatient Hospital Stay (HOSPITAL_COMMUNITY): Payer: Medicare Other | Admitting: Occupational Therapy

## 2014-05-21 ENCOUNTER — Inpatient Hospital Stay (HOSPITAL_COMMUNITY): Payer: Medicare Other | Admitting: Physical Therapy

## 2014-05-21 LAB — BASIC METABOLIC PANEL
Anion gap: 11 (ref 5–15)
BUN: 21 mg/dL — ABNORMAL HIGH (ref 6–20)
CALCIUM: 9 mg/dL (ref 8.9–10.3)
CO2: 22 mmol/L (ref 22–32)
Chloride: 105 mmol/L (ref 101–111)
Creatinine, Ser: 1.31 mg/dL — ABNORMAL HIGH (ref 0.44–1.00)
GFR calc Af Amer: 39 mL/min — ABNORMAL LOW (ref >60)
GFR calc non Af Amer: 34 mL/min — ABNORMAL LOW (ref >60)
GLUCOSE: 136 mg/dL — AB (ref 70–99)
Potassium: 3.8 mmol/L (ref 3.5–5.1)
Sodium: 138 mmol/L (ref 135–145)

## 2014-05-21 LAB — GLUCOSE, CAPILLARY
GLUCOSE-CAPILLARY: 127 mg/dL — AB (ref 70–99)
Glucose-Capillary: 151 mg/dL — ABNORMAL HIGH (ref 70–99)
Glucose-Capillary: 114 mg/dL — ABNORMAL HIGH (ref 70–99)
Glucose-Capillary: 132 mg/dL — ABNORMAL HIGH (ref 70–99)

## 2014-05-21 NOTE — Progress Notes (Signed)
Occupational Therapy Session Note  Patient Details  Name: [name] [name]: [id number] Date of Birth: [birth year]  Today's Date: 05/21/2014 OT Individual Time: 0900-1000 OT Individual Time Calculation (min): 60 min    Short Term Goals: Week 1:  OT Short Term Goal 1 (Week 1): Pt will complete bathing at min assist level OT Short Term Goal 2 (Week 1): Pt will complete UB dressing with mod assist and cues for hemi dressing technique OT Short Term Goal 3 (Week 1): Pt will complete LB dressing with mod assist OT Short Term Goal 4 (Week 1): Pt completed bathing and dressing with mod cues for positioning of RUE  Skilled Therapeutic Interventions/Progress Updates:    Pt seen for ADL retraining with focus on attention to R, functional transfers, standing balance, safety awareness, and activity tolerance. Pt received supine in bed. Completed supine>sit with min A and min cues for technique. Completed all functional transfers in room and bathroom at min A with min-mod cues for safety. Pt completed toileting with steadying assist for hygiene (pt not wearing LB clothing). Completed bathing at shower level with hand over hand assist provided to RUE to wash LUE. Pt required max cues for visual attention and awareness of RUE during self-care tasks. Pt verbalized recall of hemi dressing techniques, however required min cues for carryover. Pt fatigued quickly during bathing and dressing, requiring multiple rest breaks. Provided AAROM and PROM to RUE while pt sitting in w/c. Pt demonstrating improved AROM at shoulder and trace bicep activation. Therapist provided muscle tapping to biceps to facilitate activation with no change noted. Pt with no AROM in wrist or digits. At end of session pt left sitting in w/c with quick release belt donned and all needs in reach.   Therapy Documentation Precautions:  Precautions Precautions: Fall Precaution Comments: pacemaker Restrictions Weight Bearing Restrictions:  No General:   Vital Signs: Therapy Vitals Pulse Rate: 72 BP: 137/70 mmHg Patient Position (if appropriate): Sitting (after ambulation) Oxygen Therapy SpO2: 100 % O2 Device: Not Delivered Pain: Pain Assessment Pain Assessment: No/denies pain  See FIM for current functional status  Therapy/Group: Individual Therapy  Daneil Danerkinson, Cresencia Asmus N 05/21/2014, 11:23 AM

## 2014-05-21 NOTE — Progress Notes (Signed)
Occupational Therapy Session Note  Patient Details  Name: [name] MRN: [id number] Date of Birth: 10/15/1918  Today's Date: 05/21/2014 OT Individual Time: 1435-1505 OT Individual Time Calculation (min): 30 min    Short Term Goals: Week 1:  OT Short Term Goal 1 (Week 1): Pt will complete bathing at min assist level OT Short Term Goal 1 - Progress (Week 1): Met OT Short Term Goal 2 (Week 1): Pt will complete UB dressing with mod assist and cues for hemi dressing technique OT Short Term Goal 2 - Progress (Week 1): Met OT Short Term Goal 3 (Week 1): Pt will complete LB dressing with mod assist OT Short Term Goal 3 - Progress (Week 1): Met OT Short Term Goal 4 (Week 1): Pt completed bathing and dressing with mod cues for positioning of RUE OT Short Term Goal 4 - Progress (Week 1): Not met Week 2:  OT Short Term Goal 1 (Week 2): Focus on LTGs due to discharge set for 5/9      Skilled Therapeutic Interventions/Progress Updates:    Pt taken to gym and seen this session to focus on RUE neuro re-ed with A/AROM for shoulder and elbow with gravity eliminated towel slides on table and facilitated reaching for bean bags. No active wrist or finger movement, so pt needed full A to grasp bean bags but excellent initiation of lifting R arm. Much improved attention to R arm during session but continual cues needed for pt not to use L hand to help R arm move.  Pt is also developing some pronation. Pt taken back to room and transferred to recliner with quick release belt and call light in lap.  Therapy Documentation Precautions:  Precautions Precautions: Fall Precaution Comments: pacemaker Restrictions Weight Bearing Restrictions: No    Vital Signs: Therapy Vitals Temp: 98 F (36.7 C) Temp Source: Oral Pulse Rate: 80 Resp: 18 BP: 104/63 mmHg Patient Position (if appropriate): Lying Oxygen Therapy SpO2: 97 % O2 Device: Not Delivered Pain: Pain Assessment Pain Assessment: No/denies  pain ADL:  See FIM for current functional status  Therapy/Group: Individual Therapy  , 05/21/2014, 4:39 PM  

## 2014-05-21 NOTE — Plan of Care (Signed)
Problem: RH PAIN MANAGEMENT Goal: RH STG PAIN MANAGED AT OR BELOW PT'S PAIN GOAL <3 Outcome: Progressing No c/o pain     

## 2014-05-21 NOTE — Plan of Care (Signed)
Problem: RH Balance Goal: LTG Patient will maintain dynamic standing with ADLs (OT) LTG: Patient will maintain dynamic standing balance with assist during activities of daily living (OT)  Downgraded 5/3 due to slow progress in therapy  Problem: RH Bathing Goal: LTG Patient will bathe with assist, cues/equipment (OT) LTG: Patient will bathe specified number of body parts with assist with/without cues using equipment (position) (OT)  Downgraded 5/3 due to slow progress in therapy  Problem: RH Dressing Goal: LTG Patient will perform lower body dressing w/assist (OT) LTG: Patient will perform lower body dressing with assist, with/without cues in positioning using equipment (OT)  Downgraded 5/3 due to slow progress in therapy  Problem: RH Toileting Goal: LTG Patient will perform toileting w/assist, cues/equip (OT) LTG: Patient will perform toiletiing (clothes management/hygiene) with assist, with/without cues using equipment (OT)  Downgraded 5/3 due to slow progress in therapy  Problem: RH Functional Use of Upper Extremity Goal: LTG Patient will use RT/LT upper extremity as a (OT) LTG: Patient will use right/left upper extremity as a stabilizer/gross assist/diminished/nondominant/dominant level with assist, with/without cues during functional activity (OT)  Downgraded 5/3 due to slow progress in therapy  Problem: RH Toilet Transfers Goal: LTG Patient will perform toilet transfers w/assist (OT) LTG: Patient will perform toilet transfers with assist, with/without cues using equipment (OT)  Downgraded 5/3 due to slow progress in therapy  Problem: RH Tub/Shower Transfers Goal: LTG Patient will perform tub/shower transfers w/assist (OT) LTG: Patient will perform tub/shower transfers with assist, with/without cues using equipment (OT)  Downgraded 5/3 due to slow progress in therapy

## 2014-05-21 NOTE — Progress Notes (Signed)
Occupational Therapy Weekly Progress Note  Patient Details  Name: [name] MRN: [id number] Date of Birth: 06/12/1918  Beginning of progress report period: May 14, 2014 End of progress report period: May 21, 2014  Today's Date: 05/21/2014  Patient has met 3 of 4 short term goals.  Patient has made some functional gains this reporting period, however demonstrates slow progress. Patient requires min assist for functional transfers with mod-max cues for safety secondary to impulsivity and decreased awareness. Patient requires min-max assist with bathing and dressing with max cues for positioning of RUE during functional tasks. Patient requires max multimodal cues and manual facilitation to isolate muscle recruitment in RUE. Patient demonstrates AROM at shoulder and elbow only. Patient's greatest barriers at this time are decreased awareness, decreased functional use of RUE, decreased carryover, and decreased attention. Patient's goals downgraded to min-mod assist overall secondary to slow progress at this time.   Patient continues to demonstrate the following deficits: R hemiplegia, decreased postural control, decreased balance strategies, decreased attention, decreased awareness, decreased problem solving, decreased memory, decreased safety awareness, decreased activity tolerance, decreased strength, decreased coordination, decreased word finding skills, decreased functional use of RUE/RLE and therefore will continue to benefit from skilled OT intervention to enhance overall performance with BADLs, functional use of RUE, ability to compensate for deficits, balance, awareness, activity tolerance, balance, and strength.  Patient not progressing toward long term goals.  See goal revision..  Plan of care revisions: Goals downgraded to min-mod assist overall due to slow progress in therapy.  OT Short Term Goals Week 1:  OT Short Term Goal 1 (Week 1): Pt will complete bathing at min assist level OT  Short Term Goal 1 - Progress (Week 1): Met OT Short Term Goal 2 (Week 1): Pt will complete UB dressing with mod assist and cues for hemi dressing technique OT Short Term Goal 2 - Progress (Week 1): Met OT Short Term Goal 3 (Week 1): Pt will complete LB dressing with mod assist OT Short Term Goal 3 - Progress (Week 1): Met OT Short Term Goal 4 (Week 1): Pt completed bathing and dressing with mod cues for positioning of RUE OT Short Term Goal 4 - Progress (Week 1): Not met Week 2:  OT Short Term Goal 1 (Week 2): Focus on LTGs due to discharge set for 5/9  Skilled Therapeutic Interventions/Progress Updates:      Therapy Documentation Precautions:  Precautions Precautions: Fall Precaution Comments: pacemaker Restrictions Weight Bearing Restrictions: No General:   Vital Signs: Therapy Vitals Temp: 98 F (36.7 C) Temp Source: Oral Pulse Rate: 80 Resp: 18 BP: 104/63 mmHg Patient Position (if appropriate): Lying Oxygen Therapy SpO2: 97 % O2 Device: Not Delivered Pain: Pain Assessment Pain Assessment: No/denies pain  See FIM for current functional status  Therapy/Group: Individual Therapy  ,  N 05/21/2014, 3:18 PM   

## 2014-05-21 NOTE — Plan of Care (Signed)
Problem: RH BOWEL ELIMINATION Goal: RH STG MANAGE BOWEL WITH ASSISTANCE STG Manage Bowel with Assistance. Mod I Outcome: Progressing No incontinent episode reported     

## 2014-05-21 NOTE — Progress Notes (Signed)
Speech Language Pathology Daily Session Note  Patient Details  Name: [name] MRN: [id number] Date of Birth: November 28, 1918  Today's Date: 05/21/2014 SLP Individual Time: 1205-1250 SLP Individual Time Calculation (min): 45 min  Short Term Goals: Week 2: SLP Short Term Goal 1 (Week 2): Patient will consume current diet with minimal overt s/s of aspiration with Min A multimodal cues for use of swallowing compensatory straegies. SLP Short Term Goal 1 - Progress (Week 2): Progressing toward goal SLP Short Term Goal 2 (Week 2): Patient will demonstrate efficient mastication of Dys. 3 textures evidenced by minimal oral residue without overt s/s of aspiration with Min A multimodal cues.  SLP Short Term Goal 2 - Progress (Week 2): Progressing toward goal SLP Short Term Goal 3 (Week 2): Patient will utilize external memory aids to recall new, daily information with Mod A multimodal cues.  SLP Short Term Goal 3 - Progress (Week 2): Progressing toward goal SLP Short Term Goal 4 (Week 2): Patient will identify 2 cognitive and 2 physical changes with Min A multimodal cues.  SLP Short Term Goal 4 - Progress (Week 2): Progressing toward goal SLP Short Term Goal 5 (Week 2): Patient will utilize word-finding strategies at the phrase level with Min A multimodal cues.  SLP Short Term Goal 5 - Progress (Week 2): Progressing toward goal SLP Short Term Goal 6 (Week 2): Patient will utilize speech intelligibility strategies at the sentence level with Mod I.  SLP Short Term Goal 6 - Progress (Week 2): Progressing toward goal  Skilled Therapeutic Interventions: Skilled ST treatment focused on cognitive-linguistic and dysphagia goals. Pt was observed during lunch meal, and required Mod A verbal cues to decrease bolus size and rate, and attend to right side of mouth and anterior leakage. No overt s/s aspiration were observed during the meal. Pt was oriented to person only, unable to tell SLP the month or year, place,  or reason for hospitalization. SLP wrote month and year in large print and posted it in pt room. Min-Mod A cues were required to remind pt of its presence and location. Pt was uncertain about the number of children she has, and indicated the ages of her son and daughter as in their 7630's and 5020's. No family present to clarify information.    FIM:  Comprehension Comprehension Mode: Auditory Comprehension: 5-Understands basic 90% of the time/requires cueing < 10% of the time Expression Expression Mode: Verbal Expression: 5-Expresses basic 90% of the time/requires cueing < 10% of the time. Social Interaction Social Interaction: 6-Interacts appropriately with others with medication or extra time (anti-anxiety, antidepressant). Problem Solving Problem Solving: 2-Solves basic 25 - 49% of the time - needs direction more than half the time to initiate, plan or complete simple activities Memory Memory: 2-Recognizes or recalls 25 - 49% of the time/requires cueing 51 - 75% of the time FIM - Eating Eating Activity: 5: Needs verbal cues/supervision  Pain Pain Assessment Pain Assessment: No/denies pain  Therapy/Group: Individual Therapy   [name] B. WedgewoodBueche, [facility] Medical CenterMSP, CCC-SLP [phone]  Leigh AuroraBueche, [name] 05/21/2014, 4:52 PM

## 2014-05-21 NOTE — Progress Notes (Signed)
[place] PHYSICAL MEDICINE & REHABILITATION     PROGRESS NOTE    Subjective/Complaints: Feeling well. "i'm ready to go home"  No n/v/d/cp/sob. No back/leg pain.  Objective: Vital Signs: Blood pressure 140/66, pulse 73, temperature 98.3 F (36.8 C), temperature source Oral, resp. rate 18, SpO2 98 %. No results found. No results for input(s): WBC, HGB, HCT, PLT in the last 72 hours.  Recent Labs  05/21/14 0628  NA 138  K 3.8  CL 105  GLUCOSE 136*  BUN 21*  CREATININE 1.31*  CALCIUM 9.0   CBG (last 3)   Recent Labs  05/20/14 1619 05/20/14 2056 05/21/14 0637  GLUCAP 151* 151* 127*    Wt Readings from Last 3 Encounters:  05/13/14 54.7 kg (120 lb 9.5 oz)  10/24/13 58.06 kg (128 lb)  10/16/13 58.423 kg (128 lb 12.8 oz)    Physical Exam:  Gen: pt pleasant, alert, looks younger than stated age HENT: oral mucosa pink, dentition good Eyes: EOM are normal.  Neck: Normal range of motion. Neck supple. No thyromegaly present.  Cardiovascular:  Cardiac rate controlled without murmur Respiratory: Effort normal and breath sounds normal. No respiratory distress.no wheezes, rales  GI: Soft. Bowel sounds are normal. She exhibits no distension. No tenderness Neurological: She is alert and oriented to place and reason she's here. Right central 7 and mild tongue deviation. She is hard of hearing bilaterally but still is able to converse without additional effort or volume. She followed all simple commands. There wasa Occasionally delay in processing and language , but this appears much improved from prior notes in chart. RUE: 2- to 2/5 deltoid, biceps, triceps, trace to 0/5 wrist and hand. RLE: + to 3- hf, ke, and adfl/apf. Sensation appeared to be grossly intact to light touch and pain in right upper, lower, and face.   Right tone: DTR's 3+, toes up. foot is hypersentitive toutouch. 5 minus/5 in the left deltoid, biceps, triceps, grip, hip flexor, knee extensor, ankle dorsi  flexors and plantar flexion Psych: pleasant, confused, redirectable     Assessment/Plan: 1. 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 Physiatrist is providing close team supervision and 24 hour management of active medical problems listed below. Physiatrist and rehab team continue to assess barriers to discharge/monitor patient progress toward functional and medical goals. FIM: FIM - Bathing Bathing Steps Patient Completed: Chest, Right Arm, Abdomen, Front perineal area, Right upper leg, Left upper leg, Right lower leg (including foot), Left lower leg (including foot), Buttocks Bathing: 4: Min-Patient completes 8-9 45f10 parts or 75+ percent  FIM - Upper Body Dressing/Undressing Upper body dressing/undressing steps patient completed: Thread/unthread right bra strap, Thread/unthread left bra strap, Thread/unthread left sleeve of pullover shirt/dress, Put head through opening of pull over shirt/dress, Pull shirt over trunk Upper body dressing/undressing: 3: Mod-Patient completed 50-74% of tasks FIM - Lower Body Dressing/Undressing Lower body dressing/undressing steps patient completed: Thread/unthread right underwear leg, Thread/unthread right pants leg, Don/Doff left shoe Lower body dressing/undressing: 2: Max-Patient completed 25-49% of tasks  FIM - Toileting Toileting steps completed by patient: Adjust clothing prior to toileting, Performs perineal hygiene, Adjust clothing after toileting Toileting Assistive Devices: Grab bar or rail for support Toileting: 2: Max-Patient completed 1 of 3 steps  FIM - TRadio producerDevices: Grab bars Toilet Transfers: 3-To toilet/BSC: Mod A (lift or lower assist)  FIM - BHotel manager  Bed/Chair Transfer Assistive Devices: Arm rests Bed/Chair Transfer: 4: Bed > Chair or W/C:  Min A (steadying Pt. > 75%), 4: Chair or W/C > Bed: Min A (steadying Pt. > 75%), 5: Supine > Sit: Supervision (verbal cues/safety issues), 5: Sit > Supine: Supervision (verbal cues/safety issues)  FIM - Locomotion: Wheelchair Distance: 150 Locomotion: Wheelchair: 2: Travels 50 - 149 ft with minimal assistance (Pt.>75%) FIM - Locomotion: Ambulation Locomotion: Ambulation Assistive Devices: Walker - Rolling, Other (comment) (R HO) Ambulation/Gait Assistance: 5: Supervision, 4: Min guard Locomotion: Ambulation: 2: Travels 50 - 149 ft with minimal assistance (Pt.>75%)  Comprehension Comprehension Mode: Auditory Comprehension: 5-Follows basic conversation/direction: With no assist  Expression Expression Mode: Verbal Expression: 5-Expresses basic 90% of the time/requires cueing < 10% of the time.  Social Interaction Social Interaction: 5-Interacts appropriately 90% of the time - Needs monitoring or encouragement for participation or interaction.  Problem Solving Problem Solving: 3-Solves basic 50 - 74% of the time/requires cueing 25 - 49% of the time  Memory Memory: 2-Recognizes or recalls 25 - 49% of the time/requires cueing 51 - 75% of the time  Medical Problem List and Plan: 1. Functional deficits secondary to cardio-embolic, left MCA branch infarct with hemorrhagic transformation after TPA  -team conference today 2. DVT Prophylaxis/Anticoagulation: SCDs. Monitor for any signs of DVT 3. Pain Management: Tylenol as needed---minimal pain at present 4. Mood/history of memory loss/mild dementia: Aricept 5 mg daily at bedtime  -exacerbated by cva/acute hospitalization 5. Neuropsych: This patient is not capable of making decisions on her own behalf. 6. Skin/Wound Care: Routine skin checks 7. Fluids/Electrolytes/Nutrition:   Encouraging po fluids.   -see below 8. Dysphagia/a fascia. Dysphagia 2 thin liquids. advance per speech therapy, poor fluid intake, monitor be met as well as I's and  O's, may need IV fluid at night 9. Hypertension/atrial fibrillation/pacemaker. Cardizem CD 120 mg daily,Multaq 400 mg twice a day, cardiac rate control 10.Bilat carotid Stenosis. VVS to see to help determine potential surgical needs moving forward 11. Hypothyroidism. Synthroid 12. Diabetes mellitus of peripheral neuropathy. Hemoglobin A1c 7.5. Sliding scale insulin.   -sugars generally acceptable at present 13. Chronic renal insufficiency/mild acute renal insufficiency. Baseline creatinine 1.36  -volume depleted ----poor intake of meals and fluid, cont IVF 59m/hr x 12 during the night if po fluid intake < 10065mduring the day  -continue to encourage PO  -added megace yesterday---some improvement yesterday  -follow labs serially 14. CAD/NSTEMI/angioplasty. Continue aspirin. No chest pain or shortness of breath 15. Hyperlipidemia. Zetia, Crestor    LOS (Days) 8 A FACE TO FACE EVALUATION WAS PERFORMED  [name] T 05/21/2014 8:33 AM

## 2014-05-21 NOTE — Progress Notes (Signed)
Speech Language Pathology Daily Session Note  Patient Details  Name: [name] MRN: [id number] Date of Birth: [birth year]  Today's Date: 05/21/2014 SLP Individual Time: 1130-1150 SLP Individual Time Calculation (min): 20 min and Today's Date: 05/21/2014 SLP Missed Time: 10 Minutes Missed Time Reason: Toileting  Short Term Goals: Week 2: SLP Short Term Goal 1 (Week 2): Patient will consume current diet with minimal overt s/s of aspiration with Min A multimodal cues for use of swallowing compensatory straegies. SLP Short Term Goal 2 (Week 2): Patient will demonstrate efficient mastication of Dys. 3 textures evidenced by minimal oral residue without overt s/s of aspiration with Min A multimodal cues.  SLP Short Term Goal 3 (Week 2): Patient will utilize external memory aids to recall new, daily information with Mod A multimodal cues.  SLP Short Term Goal 4 (Week 2): Patient will identify 2 cognitive and 2 physical changes with Min A multimodal cues.  SLP Short Term Goal 5 (Week 2): Patient will utilize word-finding strategies at the phrase level with Min A multimodal cues.  SLP Short Term Goal 6 (Week 2): Patient will utilize speech intelligibility strategies at the sentence level with Mod I.   Skilled Therapeutic Interventions: Skilled treatment session focused on cognitive-linguistic, speech and dysphagia goals. SLP facilitated session by providing supervision verbal cues for use of speech intelligibility strategies and patient was 100% intelligible at the sentence level. Patient was independently oriented to situation but required Min A verbal and question cues for engagement/social interaction with functional conversation. Patient initiated self-feeding of lunch meal of Dys. 2 textures with thin liquids without overt s/s of aspiration and required Mod A verbal cues for use of small bites, slow rate of self-feeding and attention to right buccal pocketing and anterior spillage. Patient  independently requested to use the commode X 2 throughout session, therefore, patient missed 10 minutes of skilled SLP intervention.  Continue with current plan of care.    FIM:  Comprehension Comprehension Mode: Auditory Comprehension: 5-Understands basic 90% of the time/requires cueing < 10% of the time Expression Expression Mode: Verbal Expression: 5-Expresses basic 90% of the time/requires cueing < 10% of the time. Social Interaction Social Interaction: 5-Interacts appropriately 90% of the time - Needs monitoring or encouragement for participation or interaction. Problem Solving Problem Solving: 2-Solves basic 25 - 49% of the time - needs direction more than half the time to initiate, plan or complete simple activities Memory Memory: 2-Recognizes or recalls 25 - 49% of the time/requires cueing 51 - 75% of the time FIM - Eating Eating Activity: 5: Needs verbal cues/supervision;4: Helper checks for pocketed food  Pain Pain Assessment Pain Assessment: No/denies pain  Therapy/Group: Individual Therapy  Ohn Bostic 05/21/2014, 2:18 PM

## 2014-05-21 NOTE — Progress Notes (Signed)
Physical Therapy Session Note  Patient Details  Name: [name] MRN: [id number] Date of Birth: 07/27/1918  Today's Date: 05/21/2014 PT Individual Time: 1000-1100 PT Individual Time Calculation (min): 60 min   Short Term Goals: Week 2:  PT Short Term Goal 1 (Week 2): = LTGs due to anticipated LOS  Skilled Therapeutic Interventions/Progress Updates:   Session focused on R NMR, standing balance, and activity tolerance. Patient propelled wheelchair using BLE only with cushion removed x 100 ft, S-min A overall with max cues to continue with activity and for sequencing with frequent rest breaks due to fatigue. Sit <> stand from wheelchair 2 x 5 with verbal cues for upright posture and pushing up from wheelchair arm rest. Gait using RW with R hand orthosis x 80 ft with supervision-min guard, total verbal cues for sequencing taking RUE on/off RW, keeping RW closer to body, upright posture, and safety awareness. Provided patient with hand written instructions taped to RW to assist with sequencing HO. Patient read instructions aloud but unable to follow them without total cues. Attempted to have patient put instructions in own words but she was unable to comprehend task, most likely because of memory impairments limiting understanding of difficulty with sit <> stand safety.  Gait using RW negotiating obstacle course including weaving between cones and over thresholds with min-max A for balance and total verbal cues to complete safely. Patient required multiple rest breaks throughout session due to fatigue and report of feeling "exhausted." Patient ambulated back to room using RW with HO with one seated rest break at chairs by elevator and left sitting in wheelchair with quick release belt on and 1/2 lap tray donned with all needs within reach.   Therapy Documentation Precautions:  Precautions Precautions: Fall Precaution Comments: pacemaker Restrictions Weight Bearing Restrictions: No Pain: Pain  Assessment Pain Assessment: No/denies pain  See FIM for current functional status  Therapy/Group: Individual Therapy  Kerney ElbeVarner, Rashay Barnette A 05/21/2014, 10:50 AM

## 2014-05-22 ENCOUNTER — Inpatient Hospital Stay (HOSPITAL_COMMUNITY): Payer: Medicare Other | Admitting: Speech Pathology

## 2014-05-22 ENCOUNTER — Inpatient Hospital Stay (HOSPITAL_COMMUNITY): Payer: Medicare Other | Admitting: Occupational Therapy

## 2014-05-22 ENCOUNTER — Inpatient Hospital Stay (HOSPITAL_COMMUNITY): Payer: Medicare Other | Admitting: Physical Therapy

## 2014-05-22 LAB — GLUCOSE, CAPILLARY
Glucose-Capillary: 109 mg/dL — ABNORMAL HIGH (ref 70–99)
Glucose-Capillary: 141 mg/dL — ABNORMAL HIGH (ref 70–99)
Glucose-Capillary: 140 mg/dL — ABNORMAL HIGH (ref 70–99)
Glucose-Capillary: 128 mg/dL — ABNORMAL HIGH (ref 70–99)

## 2014-05-22 NOTE — Progress Notes (Signed)
Physical Therapy Session Note  Patient Details  Name: Zakyra Dupuy MRN: Mickel [id number] Date of Birth: 08-22-1918  Today's Date: 05/22/2014 PT Individual Time: 1000-1100 PT Individual Time Calculation (min): 60 min   Short Term Goals: Week 2:  PT Short Term Goal 1 (Week 2): = LTGs due to anticipated LOS  Skilled Therapeutic Interventions/Progress Updates:   Session focused on R NMR, standing balance, strengthening, safety with functional mobility, awareness of deficits, and activity tolerance. Patient propelled wheelchair (cushion removed to reach floor) using BLE x 100 ft with supervision cues for technique. Gait using RW with hand orthosis x 85 ft + 2 x 75 ft with supervision in controlled and home environments, total cues for sequencing use of HO, upright posture, and safety when turning. In ADL apartment, patient performed bed mobility on flat bed with mod I and furniture transfers with supervision. Stair training up/down four 6" steps using LUE (R GiveMohr donned) and min A, verbal cues for safe step-to sequencing. To challenge dynamic standing balance, patient kicked soccer ball to fatigue each LE with seated rest break between RLE/LLE trials with min-mod A for balance and max verbal cues for upright posture and stepping strategy to regain balance. Patient participated in 2 games of horseshoes in standing with min A for balance. Patient propelled back to room using BLE and left sitting in wheelchair with 1/2 lap tray and quick release belt donned, all needs within reach.   Therapy Documentation Precautions:  Precautions Precautions: Fall Precaution Comments: pacemaker Restrictions Weight Bearing Restrictions: No Pain: Pain Assessment Pain Assessment: No/denies pain Locomotion : Ambulation Ambulation/Gait Assistance: 5: Supervision   See FIM for current functional status  Therapy/Group: Individual Therapy  Kerney ElbeVarner, Ramondo Dietze A 05/22/2014, 12:30 PM

## 2014-05-22 NOTE — Progress Notes (Signed)
[facility] PHYSICAL MEDICINE & REHABILITATION     PROGRESS NOTE    Subjective/Complaints: No complaints. Slept well. Denies pain, sob, cp,nvd  Objective: Vital Signs: Blood pressure 137/57, pulse 60, temperature 98.2 F (36.8 C), temperature source Oral, resp. rate 18, SpO2 96 %. No results found. No results for input(s): WBC, HGB, HCT, PLT in the last 72 hours.  Recent Labs  05/21/14 0628  NA 138  K 3.8  CL 105  GLUCOSE 136*  BUN 21*  CREATININE 1.31*  CALCIUM 9.0   CBG (last 3)   Recent Labs  05/21/14 1650 05/21/14 2046 05/22/14 0647  GLUCAP 114* 132* 109*    Wt Readings from Last 3 Encounters:  05/13/14 54.7 kg (120 lb 9.5 oz)  10/24/13 58.06 kg (128 lb)  10/16/13 58.423 kg (128 lb 12.8 oz)    Physical Exam:  Gen: pt pleasant, alert, looks younger than stated age HENT: oral mucosa pink, dentition good Eyes: EOM are normal.  Neck: Normal range of motion. Neck supple. No thyromegaly present.  Cardiovascular:  Cardiac rate controlled without murmur Respiratory: Effort normal and breath sounds normal. No respiratory distress.no wheezes, rales  GI: Soft. Bowel sounds are normal. She exhibits no distension. No tenderness Neurological: She is alert and oriented to place and reason she's here. Right central 7 and mild tongue deviation. She is hard of hearing bilaterally but still is able to converse without additional effort or volume. She followed all simple commands. There wasa Occasionally delay in processing and language , but this appears much improved from prior notes in chart. RUE: 2- to 2/5 deltoid, biceps, triceps, trace to 0/5 wrist and hand. RLE: + to 3- hf, ke, and adfl/apf. Sensation appeared to be grossly intact to light touch and pain in right upper, lower, and face.   Right tone: DTR's 3+, toes up. foot is hypersentitive toutouch. 5 minus/5 in the left deltoid, biceps, triceps, grip, hip flexor, knee extensor, ankle dorsi flexors and plantar  flexion Psych: pleasant, confused, redirectable     Assessment/Plan: 1. 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 Physiatrist is providing close team supervision and 24 hour management of active medical problems listed below. Physiatrist and rehab team continue to assess barriers to discharge/monitor patient progress toward functional and medical goals.  Still lacking much initiation, carry over from day to day. Slow progress cognitively. Moving fairly well.  FIM: FIM - Bathing Bathing Steps Patient Completed: Chest, Right Arm, Abdomen, Front perineal area, Right upper leg, Left upper leg, Right lower leg (including foot), Left lower leg (including foot), Buttocks Bathing: 4: Min-Patient completes 8-9 67f10 parts or 75+ percent  FIM - Upper Body Dressing/Undressing Upper body dressing/undressing steps patient completed: Thread/unthread right bra strap, Thread/unthread left bra strap, Thread/unthread left sleeve of pullover shirt/dress, Put head through opening of pull over shirt/dress, Thread/unthread right sleeve of pullover shirt/dresss, Pull shirt over trunk Upper body dressing/undressing: 4: Min-Patient completed 75 plus % of tasks FIM - Lower Body Dressing/Undressing Lower body dressing/undressing steps patient completed: Thread/unthread right underwear leg, Thread/unthread right pants leg, Don/Doff left shoe, Thread/unthread left underwear leg, Thread/unthread left pants leg Lower body dressing/undressing: 3: Mod-Patient completed 50-74% of tasks  FIM - Toileting Toileting steps completed by patient: Performs perineal hygiene (pt not wearing LB clothing) Toileting Assistive Devices: Grab bar or rail for support Toileting: 4: Steadying assist  FIM - Toilet Transfers  Chief of Staff Devices: Product manager Transfers: 4-To  toilet/BSC: Min A (steadying Pt. > 75%), 4-From toilet/BSC: Min A (steadying Pt. > 75%)  FIM - Bed/Chair Transfer Bed/Chair Transfer Assistive Devices: Arm rests Bed/Chair Transfer: 4: Supine > Sit: Min A (steadying Pt. > 75%/lift 1 leg), 4: Bed > Chair or W/C: Min A (steadying Pt. > 75%)  FIM - Locomotion: Wheelchair Distance: 150 Locomotion: Wheelchair: 2: Travels 50 - 149 ft with minimal assistance (Pt.>75%) FIM - Locomotion: Ambulation Locomotion: Ambulation Assistive Devices: Walker - Rolling, Other (comment) (R HO) Ambulation/Gait Assistance: 5: Supervision, 4: Min guard Locomotion: Ambulation: 2: Travels 50 - 149 ft with minimal assistance (Pt.>75%)  Comprehension Comprehension Mode: Auditory Comprehension: 5-Follows basic conversation/direction: With no assist  Expression Expression Mode: Verbal Expression: 5-Expresses basic 90% of the time/requires cueing < 10% of the time.  Social Interaction Social Interaction: 5-Interacts appropriately 90% of the time - Needs monitoring or encouragement for participation or interaction.  Problem Solving Problem Solving: 3-Solves basic 50 - 74% of the time/requires cueing 25 - 49% of the time  Memory Memory: 2-Recognizes or recalls 25 - 49% of the time/requires cueing 51 - 75% of the time  Medical Problem List and Plan: 1. Functional deficits secondary to cardio-embolic, left MCA branch infarct with hemorrhagic transformation after TPA  -team conference today 2. DVT Prophylaxis/Anticoagulation: SCDs. Monitor for any signs of DVT 3. Pain Management: Tylenol as needed---minimal pain at present 4. Mood/history of memory loss/mild dementia: Aricept 5 mg daily at bedtime  -exacerbated by cva/acute hospitalization 5. Neuropsych: This patient is not capable of making decisions on her own behalf. 6. Skin/Wound Care: Routine skin checks 7. Fluids/Electrolytes/Nutrition:   Encouraging po fluids.   -see below 8. Dysphagia:. Dysphagia 2 thin  liquids. advance per speech therapy, poor fluid intake, monitor be met as well as I's and O's, may need IV fluid at night 9. Hypertension/atrial fibrillation/pacemaker. Cardizem CD 120 mg daily,Multaq 400 mg twice a day, cardiac rate control 10.Bilat carotid Stenosis. VVS to see to help determine potential surgical needs moving forward 11. Hypothyroidism. Synthroid 12. Diabetes mellitus of peripheral neuropathy. Hemoglobin A1c 7.5. Sliding scale insulin.   -sugars generally acceptable at present 13. Chronic renal insufficiency/mild acute renal insufficiency. Baseline creatinine 1.36  -slightly volume depleted ----poor intake of meals and fluid, cont IVF 34m/hr x 12 during the night if po fluid intake < 10050mduring the day  -continue to encourage PO  -added megace yesterday---perhaps some mild improvement  -check labs again thursday 14. CAD/NSTEMI/angioplasty. Continue aspirin. No chest pain or shortness of breath 15. Hyperlipidemia. Zetia, Crestor    LOS (Days) 9 A FACE TO FACE EVALUATION WAS PERFORMED  [name] T 05/22/2014 7:52 AM

## 2014-05-22 NOTE — Progress Notes (Signed)
Occupational Therapy Session Note  Patient Details  Name: [name] MRN: [id number] Date of Birth: 1918/02/10  Today's Date: 05/22/2014 OT Individual Time: 0900-1000 OT Individual Time Calculation (min): 60 min    Short Term Goals: Week 2:  OT Short Term Goal 1 (Week 2): Focus on LTGs due to discharge set for 5/9  Skilled Therapeutic Interventions/Progress Updates:  Upon entering the room, pt supine in bed with no c/o pain. Pt requesting to wash at shower level this session. Supine >sit with supervision. Pt very impulsive with all movements this session and requiring mod verbal cues for safety awareness. Min A stand pivot bed >wheelchair. Min A toilet transfer onto standard toilet with assistance for clothing management. Pt then transferring onto TTB with Min A and requiring hand over hand assist to utilize R UE in bathing and dressing tasks. Pt returning to wheelchair and seated at sink side for dressing tasks. Pt requiring min A balance when standing for L clothing management. Pt utilized calendar on wall for orientation to time with min verbal cues. Orientation questions answered correctly with pt being given 3 choices to select from as she was having difficulty with word finding. Pt seated in wheelchair with QRB donned, arm tray donned, and  Call bell within reach upon exiting the room.   Therapy Documentation Precautions:  Precautions Precautions: Fall Precaution Comments: pacemaker Restrictions Weight Bearing Restrictions: No General:   Vital Signs:  Pain: Pain Assessment Pain Assessment: No/denies pain  See FIM for current functional status  Therapy/Group: Individual Therapy  Lowella Gripittman, Basia Mcginty L 05/22/2014, 11:51 AM

## 2014-05-22 NOTE — Patient Care Conference (Signed)
Inpatient RehabilitationTeam Conference and Plan of Care Update Date: 05/21/2014   Time: 2:50 PM    Patient Name: [name]      Medical Record Number: [id number]  Date of Birth: Aug 15, 1918 Sex: Female         Room/Bed: 4W12C/4W12C-01 Payor Info: Payor: MEDICARE / Plan: MEDICARE PART A AND B / Product Type: *No Product type* /    Admitting Diagnosis: CVA  Admit Date/Time:  05/13/2014  4:15 PM Admission Comments: No comment available   Primary Diagnosis:  Left middle cerebral artery stroke Principal Problem: Left middle cerebral artery stroke  Patient Active Problem List   Diagnosis Date Noted  . Left middle cerebral artery stroke 05/13/2014  . Right hemiparesis 05/13/2014  . Cerebral hemorrhage   . Received intravenous tissue plasminogen activator (tPA) in emergency department   . Stroke   . Cytotoxic brain edema   . CVA (cerebral infarction) 05/08/2014  . Tachy-brady syndrome   . Encounter for long-term (current) use of other medications   . CAD in native artery   . Atrial fibrillation   . Hypothyroidism   . Cardiac pacemaker in situ   . Essential hypertension, benign   . Pure hypercholesterolemia   . Sinoatrial node dysfunction     Expected Discharge Date: Expected Discharge Date: 05/27/14  Team Members Present: Physician leading conference: Dr. Faith RogueZachary Swartz Social Worker Present: Amada JupiterLucy Renia Mikelson, LCSW Nurse Present: Carmie EndAngie Joyce, RN PT Present: Bayard Huggerebecca Varner, PT OT Present: Ardis Rowanom Lanier, Darolyn RuaOTA;Kayla Perkinson, OT SLP Present: Feliberto Gottronourtney Payne, SLP PPS Coordinator present : Tora DuckMarie Noel, RN, CRRN     Current Status/Progress Goal Weekly Team Focus  Medical   ongoing memory deficits, poor carryover, lacks awareness, nutrition problems  improve attention/awareness  nutition, impulsivity control   Bowel/Bladder   Continent of bowel and bladder. LBM 05/20/14  Pt to remain continent of bowel and bladder  Monitor    Swallow/Nutrition/ Hydration   Dys. 2 textures with thin  liquids, Min-Mod A for use of swallow strategies   Supervision  increased use of swallow strategies, trials of upgraded textures    ADL's   UB bathing/dressing-mod A; LB bathing/dressing-max A; functional transfers-min A; toileting-steady A; decreased attention to right; decreased safety awareness  supervision overall with min A dressing  BADLs, functional transfers, RUE NMR, safety awareness, standing balance, activity tolerance, coordination   Mobility   min A overall, decreased R attention, decreased carryover due to premorbid memory impairments, decreased safety awareness   supervision-min A  R NMR, functional mobility training, standing balance, postural control, attention, awareness, safety, activity tolerance, pt/family education   Communication   Min-Mod A  Supervision  utilization of speech intelligibility strategies, word-finding strategies    Safety/Cognition/ Behavioral Observations  Mod A  Supervision  intellectual awareness, working memory and functional problem solving    Pain   No c/o pain   <3  Monitor s/s of  pain   Skin   CDI  No additional skin breakdown  Monitor q shift    Rehab Goals Rehab Goals Revised: minimal gains due to cogntive impairments and poor recall *See Care Plan and progress notes for long and short-term goals.  Barriers to Discharge: impulsivity, see below    Possible Resolutions to Barriers:  supervision, ongoing cognitive behavioral mgt    Discharge Planning/Teaching Needs:  [facility] "[facility]      Team Discussion:  Started megace and with IVFs hs if not drinking enough during the day.  Do not anticipate an upgrade of food texture and she has poor control in her mouth.  Very poor carryover in sessions and very impulsive.  Currently supervision/ min assist overall.  Revisions to Treatment Plan:  None   Continued Need for Acute Rehabilitation Level of Care: The patient requires daily medical  management by a physician with specialized training in physical medicine and rehabilitation for the following conditions: Daily direction of a multidisciplinary physical rehabilitation program to ensure safe treatment while eliciting the highest outcome that is of practical value to the patient.: Yes Daily medical management of patient stability for increased activity during participation in an intensive rehabilitation regime.: Yes Daily analysis of laboratory values and/or radiology reports with any subsequent need for medication adjustment of medical intervention for : Neurological problems;Other  [name] 05/22/2014, 10:28 AM

## 2014-05-22 NOTE — Progress Notes (Signed)
Social Work Patient ID: [name], female   DOB: 09/17/1918, 81 y.o.   MRN: [id number]   Anselm PancoastLucy R Kaiden Pech, LCSW Social Worker Signed  Patient Care Conference 05/22/2014 10:28 AM    Expand All Collapse All   Inpatient RehabilitationTeam Conference and Plan of Care Update Date: 05/21/2014   Time: 2:50 PM     Patient Name: [name]       Medical Record Number: [id number]  Date of Birth: 09/17/1918 Sex: Female         Room/Bed: 4W12C/4W12C-01 Payor Info: Payor: MEDICARE / Plan: MEDICARE PART A AND B / Product Type: *No Product type* /    Admitting Diagnosis: CVA  Admit Date/Time:  05/13/2014  4:15 PM Admission Comments: No comment available   Primary Diagnosis:  Left middle cerebral artery stroke Principal Problem: Left middle cerebral artery stroke    Patient Active Problem List     Diagnosis  Date Noted   .  Left middle cerebral artery stroke  05/13/2014   .  Right hemiparesis  05/13/2014   .  Cerebral hemorrhage     .  Received intravenous tissue plasminogen activator (tPA) in emergency department     .  Stroke     .  Cytotoxic brain edema     .  CVA (cerebral infarction)  05/08/2014   .  Tachy-brady syndrome     .  Encounter for long-term (current) use of other medications     .  CAD in native artery     .  Atrial fibrillation     .  Hypothyroidism     .  Cardiac pacemaker in situ     .  Essential hypertension, benign     .  Pure hypercholesterolemia     .  Sinoatrial node dysfunction       Expected Discharge Date: Expected Discharge Date: 05/27/14  Team Members Present: Physician leading conference: Dr. Faith RogueZachary Swartz Social Worker Present: Amada JupiterLucy Shaelin Lalley, LCSW Nurse Present: Carmie EndAngie Joyce, RN PT Present: Bayard Huggerebecca Varner, PT OT Present: Ardis Rowanom Lanier, Darolyn RuaOTA;Kayla Perkinson, OT SLP Present: Feliberto Gottronourtney Payne, SLP PPS Coordinator present : Tora DuckMarie Noel, RN, CRRN        Current Status/Progress  Goal  Weekly Team Focus   Medical     ongoing memory deficits, poor carryover,  lacks awareness, nutrition problems  improve attention/awareness  nutition, impulsivity control   Bowel/Bladder     Continent of bowel and bladder. LBM 05/20/14   Pt to remain continent of bowel and bladder   Monitor    Swallow/Nutrition/ Hydration     Dys. 2 textures with thin liquids, Min-Mod A for use of swallow strategies   Supervision  increased use of swallow strategies, trials of upgraded textures     ADL's     UB bathing/dressing-mod A; LB bathing/dressing-max A; functional transfers-min A; toileting-steady A; decreased attention to right; decreased safety awareness  supervision overall with min A dressing   BADLs, functional transfers, RUE NMR, safety awareness, standing balance, activity tolerance, coordination   Mobility     min A overall, decreased R attention, decreased carryover due to premorbid memory impairments, decreased safety awareness   supervision-min A  R NMR, functional mobility training, standing balance, postural control, attention, awareness, safety, activity tolerance, pt/family education    Communication     Min-Mod A  Supervision  utilization of speech intelligibility strategies, word-finding strategies    Safety/Cognition/ Behavioral Observations    Mod A  Supervision  intellectual awareness, working memory and  functional problem solving    Pain     No c/o pain   <3  Monitor s/s of  pain   Skin     CDI  No additional skin breakdown  Monitor q shift    Rehab Goals Rehab Goals Revised: minimal gains due to cogntive impairments and poor recall *See Care Plan and progress notes for long and short-term goals.    Barriers to Discharge:  impulsivity, see below     Possible Resolutions to Barriers:   supervision, ongoing cognitive behavioral mgt     Discharge Planning/Teaching Needs:   plan to d/c to [facility] either SNF or "rehab" level       Team Discussion:    Started megace and with IVFs hs if not drinking enough during the day.  Do not  anticipate an upgrade of food texture and she has poor control in her mouth.  Very poor carryover in sessions and very impulsive.  Currently supervision/ min assist overall.   Revisions to Treatment Plan:    None    Continued Need for Acute Rehabilitation Level of Care: The patient requires daily medical management by a physician with specialized training in physical medicine and rehabilitation for the following conditions: Daily direction of a multidisciplinary physical rehabilitation program to ensure safe treatment while eliciting the highest outcome that is of practical value to the patient.: Yes Daily medical management of patient stability for increased activity during participation in an intensive rehabilitation regime.: Yes Daily analysis of laboratory values and/or radiology reports with any subsequent need for medication adjustment of medical intervention for : Neurological problems;Other  Reneta Niehaus 05/22/2014, 10:28 AM                 Anselm Pancoast, LCSW Social Worker Signed  Patient Care Conference 05/15/2014  2:11 PM    Expand All Collapse All   Inpatient RehabilitationTeam Conference and Plan of Care Update Date: 05/14/2014   Time: 2:55 PM     Patient Name: [name]       Medical Record Number: [id number]  Date of Birth: 1918-10-03 Sex: Female         Room/Bed: 4W12C/4W12C-01 Payor Info: Payor: MEDICARE / Plan: MEDICARE PART A AND B / Product Type: *No Product type* /    Admitting Diagnosis: CVA  Admit Date/Time:  05/13/2014  4:15 PM Admission Comments: No comment available   Primary Diagnosis:  Left middle cerebral artery stroke Principal Problem: Left middle cerebral artery stroke    Patient Active Problem List     Diagnosis  Date Noted   .  Left middle cerebral artery stroke  05/13/2014   .  Right hemiparesis  05/13/2014   .  Cerebral hemorrhage     .  Received intravenous tissue plasminogen activator (tPA) in emergency department     .  Stroke     .   Cytotoxic brain edema     .  CVA (cerebral infarction)  05/08/2014   .  Tachy-brady syndrome     .  Encounter for long-term (current) use of other medications     .  CAD in native artery     .  Atrial fibrillation     .  Hypothyroidism     .  Cardiac pacemaker in situ     .  Essential hypertension, benign     .  Pure hypercholesterolemia     .  Sinoatrial node dysfunction       Expected Discharge Date:  Expected Discharge Date: 05/27/14  Team Members Present: Physician leading conference: Dr. Faith RogueZachary Swartz Social Worker Present: Amada JupiterLucy Gaurav Baldree, LCSW Nurse Present: Chana Bodeeborah Sharp, RN PT Present: Bayard Huggerebecca Varner, PT OT Present: Ardis Rowanom Lanier, COTA;Jennifer Fredrich RomansSmith, OT;Kayla Perkinson, OT SLP Present: Feliberto Gottronourtney Payne, SLP Other (Discipline and Name): Ottie GlazierBarbara Boyette, RN [facility] PPS Coordinator present : Tora DuckMarie Noel, RN, CRRN        Current Status/Progress  Goal  Weekly Team Focus   Medical     left mca infarct with hemorrhage, baseline dementia,   improve language, functional mobility   improve cognition, sleep   Bowel/Bladder     Pt continent of bowel and bladder. LBM 05/12/14   Pt to remain continent of bowel and bladder   Monitor   Swallow/Nutrition/ Hydration     Dys. 2 textures with thin liquids, Mod A for use of swallow strategies  Supervision  trials of upgraded textures, utilization of swallowing compensatory strategies    ADL's     min A functional transfers, mod-max A self-care tasks   supervision overall with min A dressing   R NM, functional transfers, awareness, safety, standing balance, strengthening, coordination, family education   Mobility     min-mod A up to 50 ft with R HHA  supervision overall  NMR, functional mobility training, balance, safety, activity tolerance, pt/family education   Communication     Min-Mod A  Supervision  utilization of speech intelligibility strategies, word-finding strategies   Safety/Cognition/ Behavioral Observations    Mod A  Supervision   intellectual awareness, working memory, functional problem solving    Pain     No c/o pain  <3  Monitor for nonverbal cues of pain    Skin     Bruising to LUE, wrist with mild edema, L heel pink, blanchable-Allevyn dressing to area  No additional skin breakdown  Monitor q shift. Encourage to turn q 2hrs    Rehab Goals Patient on target to meet rehab goals: Yes *See Care Plan and progress notes for long and short-term goals.    Barriers to Discharge:  baseline cognitive deficits, apraxia, awareness      Possible Resolutions to Barriers:   ongoing language and functional based therapy      Discharge Planning/Teaching Needs:   return [facility] with [facility] to be determined        Team Discussion:    New eval. Some cognitive decline since CVA.  UTI?  Does not seem to have much awareness or appreciation of her deficits and is a safety risk for falls.  Currently on D2.  Plan to return to [facility] upon d/c and team feels likely most appropriate to return through the SNF/ Rehab level of care.   Revisions to Treatment Plan:    None    Continued Need for Acute Rehabilitation Level of Care: The patient requires daily medical management by a physician with specialized training in physical medicine and rehabilitation for the following conditions: Daily direction of a multidisciplinary physical rehabilitation program to ensure safe treatment while eliciting the highest outcome that is of practical value to the patient.: Yes Daily medical management of patient stability for increased activity during participation in an intensive rehabilitation regime.: Yes Daily analysis of laboratory values and/or radiology reports with any subsequent need for medication adjustment of medical intervention for : Neurological problems;Other  Marnesha Gagen 05/15/2014, 2:11 PM

## 2014-05-22 NOTE — Progress Notes (Signed)
Pt's po fluid intake today 600cc.  IVF's started at 7p...50cc/hr. per orders.

## 2014-05-22 NOTE — Progress Notes (Addendum)
Speech Language Pathology Daily Session Note  Patient Details  Name: [name] MRN: [id number] Date of Birth: 27-Jan-1918  Today's Date: 05/22/2014 SLP Individual Time: 1355-1435 SLP Individual Time Calculation (min): 40 min  Short Term Goals: Week 2: SLP Short Term Goal 1 (Week 2): Patient will consume current diet with minimal overt s/s of aspiration with Min A multimodal cues for use of swallowing compensatory straegies. SLP Short Term Goal 1 - Progress (Week 2): Progressing toward goal SLP Short Term Goal 2 (Week 2): Patient will demonstrate efficient mastication of Dys. 3 textures evidenced by minimal oral residue without overt s/s of aspiration with Min A multimodal cues.  SLP Short Term Goal 2 - Progress (Week 2): Progressing toward goal SLP Short Term Goal 3 (Week 2): Patient will utilize external memory aids to recall new, daily information with Mod A multimodal cues.  SLP Short Term Goal 3 - Progress (Week 2): Progressing toward goal SLP Short Term Goal 4 (Week 2): Patient will identify 2 cognitive and 2 physical changes with Min A multimodal cues.  SLP Short Term Goal 4 - Progress (Week 2): Progressing toward goal SLP Short Term Goal 5 (Week 2): Patient will utilize word-finding strategies at the phrase level with Min A multimodal cues.  SLP Short Term Goal 5 - Progress (Week 2): Progressing toward goal SLP Short Term Goal 6 (Week 2): Patient will utilize speech intelligibility strategies at the sentence level with Mod I.  SLP Short Term Goal 6 - Progress (Week 2): Progressing toward goal  Skilled Therapeutic Interventions: Skilled ST treatment focused on cognitive-linguistic goals. SLP facilitated session by providing mod-max verbal cues to utilize environmental cues for orientation information. Mod-Max cues also required for pt to recall/discuss current deficits and focus of PT/OT/ST. This information was written for pt referral, and posted in pt room.  Difficulty noted with  left/right discrimination during conversation.   FIM:  Comprehension Comprehension Mode: Auditory Comprehension: 4-Understands basic 75 - 89% of the time/requires cueing 10 - 24% of the time Expression Expression Mode: Verbal Expression: 4-Expresses basic 75 - 89% of the time/requires cueing 10 - 24% of the time. Needs helper to occlude trach/needs to repeat words. Social Interaction Social Interaction: 5-Interacts appropriately 90% of the time - Needs monitoring or encouragement for participation or interaction. Problem Solving Problem Solving: 3-Solves basic 50 - 74% of the time/requires cueing 25 - 49% of the time Memory Memory: 2-Recognizes or recalls 25 - 49% of the time/requires cueing 51 - 75% of the time  Pain Pain Assessment Pain Assessment: No/denies pain  Therapy/Group: Individual Therapy   Celia B. [place], [facility] Surgery Center LLCMSP, CCC-SLP [phone]  Leigh AuroraBueche, Celia Brown 05/22/2014, 4:53 PM

## 2014-05-22 NOTE — Progress Notes (Signed)
Speech Language Pathology Daily Session Note  Patient Details  Name: [name] MRN: [id number] Date of Birth: 1918/10/04  Today's Date: 05/22/2014 SLP Individual Time: 1130-1200 SLP Individual Time Calculation (min): 30 min  Short Term Goals: Week 2: SLP Short Term Goal 1 (Week 2): Patient will consume current diet with minimal overt s/s of aspiration with Min A multimodal cues for use of swallowing compensatory straegies. SLP Short Term Goal 1 - Progress (Week 2): Progressing toward goal SLP Short Term Goal 2 (Week 2): Patient will demonstrate efficient mastication of Dys. 3 textures evidenced by minimal oral residue without overt s/s of aspiration with Min A multimodal cues.  SLP Short Term Goal 2 - Progress (Week 2): Progressing toward goal SLP Short Term Goal 3 (Week 2): Patient will utilize external memory aids to recall new, daily information with Mod A multimodal cues.  SLP Short Term Goal 3 - Progress (Week 2): Progressing toward goal SLP Short Term Goal 4 (Week 2): Patient will identify 2 cognitive and 2 physical changes with Min A multimodal cues.  SLP Short Term Goal 4 - Progress (Week 2): Progressing toward goal SLP Short Term Goal 5 (Week 2): Patient will utilize word-finding strategies at the phrase level with Min A multimodal cues.  SLP Short Term Goal 5 - Progress (Week 2): Progressing toward goal SLP Short Term Goal 6 (Week 2): Patient will utilize speech intelligibility strategies at the sentence level with Mod I.  SLP Short Term Goal 6 - Progress (Week 2): Progressing toward goal  Skilled Therapeutic Interventions: Skilled treatment session focused on cognitive goals. SLP facilitated session by generating a variety of written aids to increase recall and carryover of her speech intelligibility strategies, word-finding strategies and swallowing compensatory strategies.  Patient was independently oriented to place and situation but required Mod A verbal and visual cues to  utilize external aids to orient to date.  Patient left in wheelchair with quick release belt in place and all needs within reach. Continue with current plan of care.    FIM:  Comprehension Comprehension Mode: Auditory Comprehension: 4-Understands basic 75 - 89% of the time/requires cueing 10 - 24% of the time Expression Expression Mode: Verbal Expression: 4-Expresses basic 75 - 89% of the time/requires cueing 10 - 24% of the time. Needs helper to occlude trach/needs to repeat words. Social Interaction Social Interaction: 5-Interacts appropriately 90% of the time - Needs monitoring or encouragement for participation or interaction. Problem Solving Problem Solving: 3-Solves basic 50 - 74% of the time/requires cueing 25 - 49% of the time Memory Memory: 2-Recognizes or recalls 25 - 49% of the time/requires cueing 51 - 75% of the time  Pain Pain Assessment Pain Assessment: No/denies pain  Therapy/Group: Individual Therapy  Angelle Isais 05/22/2014, 4:49 PM

## 2014-05-22 NOTE — Plan of Care (Signed)
Problem: RH KNOWLEDGE DEFICIT Goal: RH STG INCREASE KNOWLEDGE OF HYPERTENSION Patient /caregiver will verbalizes understanding the signs and symptoms of hypo/hypertension including diet, exercise, medication regimen with mod assist.  Goal changed to mod. assist

## 2014-05-23 ENCOUNTER — Inpatient Hospital Stay (HOSPITAL_COMMUNITY): Payer: Medicare Other | Admitting: Physical Therapy

## 2014-05-23 ENCOUNTER — Inpatient Hospital Stay (HOSPITAL_COMMUNITY): Payer: Medicare Other | Admitting: Speech Pathology

## 2014-05-23 ENCOUNTER — Inpatient Hospital Stay (HOSPITAL_COMMUNITY): Payer: Medicare Other

## 2014-05-23 LAB — BASIC METABOLIC PANEL
ANION GAP: 9 (ref 5–15)
BUN: 18 mg/dL (ref 6–20)
CALCIUM: 8.9 mg/dL (ref 8.9–10.3)
CO2: 22 mmol/L (ref 22–32)
CREATININE: 1.22 mg/dL — AB (ref 0.44–1.00)
Chloride: 107 mmol/L (ref 101–111)
GFR calc Af Amer: 43 mL/min — ABNORMAL LOW (ref >60)
GFR calc non Af Amer: 37 mL/min — ABNORMAL LOW (ref >60)
Glucose, Bld: 103 mg/dL — ABNORMAL HIGH (ref 70–99)
Potassium: 3.9 mmol/L (ref 3.5–5.1)
Sodium: 138 mmol/L (ref 135–145)

## 2014-05-23 LAB — GLUCOSE, CAPILLARY
GLUCOSE-CAPILLARY: 114 mg/dL — AB (ref 70–99)
GLUCOSE-CAPILLARY: 128 mg/dL — AB (ref 70–99)
GLUCOSE-CAPILLARY: 121 mg/dL — AB (ref 70–99)
GLUCOSE-CAPILLARY: 144 mg/dL — AB (ref 70–99)

## 2014-05-23 NOTE — Progress Notes (Signed)
Occupational Therapy Session Note  Patient Details  Name: [name] MRN: [id number] Date of Birth: 08-23-1918  Today's Date: 05/23/2014 OT Individual Time: 0900-1000 OT Individual Time Calculation (min): 60 min    Short Term Goals: Week 2:  OT Short Term Goal 1 (Week 2): Focus on LTGs due to discharge set for 5/9  Skilled Therapeutic Interventions/Progress Updates:    Pt seen for ADL retraining with focus on functional transfers, standing balance, safety awareness, postural control, and attention to R. Pt received supine in bed with HOB elevated and eating breakfast. Pt agreeable to sit EOB to finish breakfast with focus on postural control and safe swallowing techniques. Pt required mod cues for safe swallowing techniques and attention to R. Pt completed all stand pivot transfers during therapy session at min A level. Pt completed bathing at shower level with mod cues for problem solving and sequencing as pt stated multiple times "help me because my right arm doesn't work." Completed dressing with min A for standing balance and max cues for attention to RUE. Therapist provided elastic shoe strings to increase independence in LB dressing. Therapist informed pt that she was going to retrieve shoe strings then return. Upon return, approx 2 min later, pt with no recall of why therapist left room. Engaged in therapeutic conversation with pt requiring mod questioning cues for intellectual awareness. Pt left sitting in w/c with all needs in reach.   Therapy Documentation Precautions:  Precautions Precautions: Fall Precaution Comments: pacemaker Restrictions Weight Bearing Restrictions: No General:   Vital Signs:  Pain: No report of pain  See FIM for current functional status  Therapy/Group: Individual Therapy  Daneil Danerkinson, Jury Caserta N 05/23/2014, 12:27 PM

## 2014-05-23 NOTE — Progress Notes (Signed)
Physical Therapy Make-Up Session Note  Patient Details  Name: [name] MRN: [id number] Date of Birth: [birth year]  Today's Date: 05/23/2014 PT Individual Time: 1515-1530 PT Individual Time Calculation (min): 15 min   Short Term Goals: Week 2:  PT Short Term Goal 1 (Week 2): = LTGs due to anticipated LOS  Skilled Therapeutic Interventions/Progress Updates:    Neuromuscular Reeducation: Pt received in bed, agreeable to "work on right arm". PT instructs pt in D1 flexion/extension exercise to R UE and D1 flexion/extension exercise to R LE: 2 x 10 reps, req verbal cues for technique.   Pt worked very hard and tolerated up to min resistance proximally during PNF treatment. Continue per PT POC.   Therapy Documentation Precautions:  Precautions Precautions: Fall Precaution Comments: pacemaker Restrictions Weight Bearing Restrictions: No   Pain: Pain Assessment Pain Assessment: No/denies pain  See FIM for current functional status  Therapy/Group: Individual Therapy  Allana Shrestha M 05/23/2014, 4:19 PM

## 2014-05-23 NOTE — Progress Notes (Signed)
Social Work Patient ID: [name], female   DOB: Jan 28, 1918, 81 y.o.   MRN: [id number]   Have spoken with pt, son and [facility], [name],  to review conference report and discuss d/c plans.  All aware that team continues to aim for d/c 05/27/14 and agreeable with this.  [name] anticipates there will be a SNF bed available on that date, however, cannot confirm 100% and asks that I contact her on the morning of the 9th to confirm.  Son requests that [facility] provide the transportation from hospital to facility - will confirm this with [name].  Will keep team posted.  Sherwin Hollingshed, LCSW

## 2014-05-23 NOTE — Progress Notes (Signed)
[facility] PHYSICAL MEDICINE & REHABILITATION     PROGRESS NOTE    Subjective/Complaints: "enjoys therapy". No issues overnight. Doesn't recall a lot of what she did yesterday. Currently denies pain, sob, cp,nvd  Objective: Vital Signs: Blood pressure 154/59, pulse 61, temperature 98.9 F (37.2 C), temperature source Oral, resp. rate 17, SpO2 96 %. No results found. No results for input(s): WBC, HGB, HCT, PLT in the last 72 hours.  Recent Labs  05/21/14 0628  NA 138  K 3.8  CL 105  GLUCOSE 136*  BUN 21*  CREATININE 1.31*  CALCIUM 9.0   CBG (last 3)   Recent Labs  05/22/14 1635 05/22/14 2110 05/23/14 0653  GLUCAP 140* 128* 114*    Wt Readings from Last 3 Encounters:  05/13/14 54.7 kg (120 lb 9.5 oz)  10/24/13 58.06 kg (128 lb)  10/16/13 58.423 kg (128 lb 12.8 oz)    Physical Exam:  Gen: pt pleasant, alert, looks younger than stated age HENT: oral mucosa pink, dentition good Eyes: EOM are normal.  Neck: Normal range of motion. Neck supple. No thyromegaly present.  Cardiovascular:  Cardiac rate controlled without murmur Respiratory: Effort normal and breath sounds normal. No respiratory distress.no wheezes, rales  GI: Soft. Bowel sounds are normal. She exhibits no distension. No tenderness Neurological: She is alert and oriented to place and reason she's here. Right central 7 and mild tongue deviation. She is hard of hearing bilaterally but still is able to converse without additional effort or volume. She followed all simple commands. There wasa Occasionally delay in processing and language , but this appears much improved from prior notes in chart. RUE: 2- to 2/5 deltoid, biceps, triceps, trace to 0/5 wrist and hand. RLE: + to 3- hf, ke, and adfl/apf. Sensation appeared to be grossly intact to light touch and pain in right upper, lower, and face.   Right tone: DTR's 3+, toes up. foot is hypersentitive toutouch. 5 minus/5 in the left deltoid, biceps, triceps,  grip, hip flexor, knee extensor, ankle dorsi flexors and plantar flexion Psych: pleasant, confused, redirectable     Assessment/Plan: 1. 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 Physiatrist is providing close team supervision and 24 hour management of active medical problems listed below. Physiatrist and rehab team continue to assess barriers to discharge/monitor patient progress toward functional and medical goals.     FIM: FIM - Bathing Bathing Steps Patient Completed: Chest, Right Arm, Abdomen, Front perineal area, Right upper leg, Left upper leg, Right lower leg (including foot), Left lower leg (including foot), Buttocks Bathing: 4: Min-Patient completes 8-9 37f10 parts or 75+ percent  FIM - Upper Body Dressing/Undressing Upper body dressing/undressing steps patient completed: Thread/unthread right bra strap, Thread/unthread left bra strap, Thread/unthread left sleeve of pullover shirt/dress, Put head through opening of pull over shirt/dress, Thread/unthread right sleeve of pullover shirt/dresss, Pull shirt over trunk Upper body dressing/undressing: 4: Min-Patient completed 75 plus % of tasks FIM - Lower Body Dressing/Undressing Lower body dressing/undressing steps patient completed: Thread/unthread right underwear leg, Thread/unthread right pants leg, Don/Doff left shoe, Thread/unthread left underwear leg, Thread/unthread left pants leg Lower body dressing/undressing: 3: Mod-Patient completed 50-74% of tasks  FIM - Toileting Toileting steps completed by patient: Performs perineal hygiene Toileting Assistive Devices: Grab bar or rail for support Toileting: 3: Mod-Patient completed 2 of 3 steps  FIM - TRadio producerDevices:  Grab bars Toilet Transfers: 4-To toilet/BSC: Min A (steadying Pt. > 75%), 4-From  toilet/BSC: Min A (steadying Pt. > 75%)  FIM - Bed/Chair Transfer Bed/Chair Transfer Assistive Devices: Arm rests Bed/Chair Transfer: 6: Supine > Sit: No assist, 4: Bed > Chair or W/C: Min A (steadying Pt. > 75%)  FIM - Locomotion: Wheelchair Distance: 150 Locomotion: Wheelchair: 2: Travels 50 - 149 ft with supervision, cueing or coaxing FIM - Locomotion: Ambulation Locomotion: Ambulation Assistive Devices: Environmental consultant - Rolling, Other (comment) (R HO) Ambulation/Gait Assistance: 5: Supervision Locomotion: Ambulation: 2: Travels 50 - 149 ft with supervision/safety issues  Comprehension Comprehension Mode: Auditory Comprehension: 5-Follows basic conversation/direction: With no assist  Expression Expression Mode: Verbal Expression: 4-Expresses basic 75 - 89% of the time/requires cueing 10 - 24% of the time. Needs helper to occlude trach/needs to repeat words.  Social Interaction Social Interaction: 5-Interacts appropriately 90% of the time - Needs monitoring or encouragement for participation or interaction.  Problem Solving Problem Solving: 3-Solves basic 50 - 74% of the time/requires cueing 25 - 49% of the time  Memory Memory: 2-Recognizes or recalls 25 - 49% of the time/requires cueing 51 - 75% of the time  Medical Problem List and Plan: 1. Functional deficits secondary to cardio-embolic, left MCA branch infarct with hemorrhagic transformation after TPA  -team conference today 2. DVT Prophylaxis/Anticoagulation: SCDs. Monitor for any signs of DVT 3. Pain Management: Tylenol as needed---minimal pain at present 4. Mood/history of memory loss/mild dementia: Aricept 5 mg daily at bedtime  -exacerbated by cva/acute hospitalization 5. Neuropsych: This patient is not capable of making decisions on her own behalf. 6. Skin/Wound Care: Routine skin checks 7. Fluids/Electrolytes/Nutrition:   Encouraging po fluids.   -see below 8. Dysphagia:. Dysphagia 2 thin liquids. advance per speech  therapy, poor fluid intake, monitor be met as well as I's and O's, may need IV fluid at night 9. Hypertension/atrial fibrillation/pacemaker. Cardizem CD 120 mg daily,Multaq 400 mg twice a day, cardiac rate control 10.Bilat carotid Stenosis. VVS to see to help determine potential surgical needs moving forward 11. Hypothyroidism. Synthroid 12. Diabetes mellitus of peripheral neuropathy. Hemoglobin A1c 7.5. Sliding scale insulin.   -sugars generally acceptable at present 13. Chronic renal insufficiency/mild acute renal insufficiency. Baseline creatinine 1.36  -  cont IVF 20m/hr x 12 during the night if po fluid intake < 10032mduring the day  -continue to encourage PO  -megace has helped appetite. Still not getting enough liquids in typically  -check labs again thursday 14. CAD/NSTEMI/angioplasty. Continue aspirin. No chest pain or shortness of breath 15. Hyperlipidemia. Zetia, Crestor    LOS (Days) 10 A FACE TO FACE EVALUATION WAS PERFORMED  [name] T 05/23/2014 8:09 AM

## 2014-05-23 NOTE — Progress Notes (Signed)
Occupational Therapy Note  Patient Details  Name: [name] MRN: [id number] Date of Birth: May 19, 1918  Today's Date: 05/23/2014 OT Individual Time: 1300-1330 OT Individual Time Calculation (min): 30 min   Pt denied pain Individual Therapy  Pt initially engaged in self feeding tasks to finish eating lunch.  Pt required HOH assist to use RUE as stabilizer to assist with self feeding.  Pt transitioned to functional reaching tasks with continued emphasis on RUE use, elbow flexion, and grasp.  Pt exhibits continued increased motor activity with shoulder flexion/extension and abduction/adduction.  Pt exhibits trace elbow flexion.  Pt continues to use body compensations to use RUE functionally.  Pt required min verbal cues for repositioning RUE when not engaged functionally.     [name], Bliss Tsang [facility] LPChappell 05/23/2014, 2:57 PM

## 2014-05-23 NOTE — Progress Notes (Signed)
Physical Therapy Session Note  Patient Details  Name: [name] MRN: [id number] Date of Birth: 1918-12-28  Today's Date: 05/23/2014 PT Individual Time: 1400-1500 PT Individual Time Calculation (min): 60 min   Short Term Goals: Week 2:  PT Short Term Goal 1 (Week 2): = LTGs due to anticipated LOS Skilled Therapeutic Interventions/Progress Updates:   Session focused on R NMR, standing balance, ambulation, stairs, functional transfers, and activity tolerance. Patient transferred supine > sit with HOB flat with supervision and ambulated to/from bathroom with min A. Patient performed toilet transfer with min A and assist for clothing management. Patient washed hands at wheelchair level then propelled wheelchair using BLE only with S-min A and mod cues for technique/sequencing. Donned GiveMohr sling to support RUE during standing tasks. Stand step transfer training wheelchair <> mat with overall supervision and total cues for safe sequencing and control during movement. Gait without AD x 85 ft + 50 ft with max verbal cues for upright posture and close supervision-min A to prevent LOB in moderately distracting environment. Stair training up/down 5 stairs x 2 using L rail min A and max verbal cues for decreased pace due to impulsivity, upright posture, and ensuring whole foot on step before stepping. Quadruped on mat with therapist stabilizing RUE on mat and facilitating forward weight shift to ensure good alignment progressed to reaching for cup with LUE to increase challenge/RUE weightbearing. NuStep using BLE and LUE at level 3 x 5 min. Gait using RW with R hand orthosis x 150 ft with close supervision. Patient still required frequent rest breaks but demonstrated improved overall balance and orientation this date. Patient oriented to location and situation as well as able to name one physical deficit without cues. Patient requested to return to bed at end of session and left semi reclined with all needs  within reach and bed alarm on.   Therapy Documentation Precautions:  Precautions Precautions: Fall Precaution Comments: pacemaker Restrictions Weight Bearing Restrictions: No Pain: Pain Assessment Pain Assessment: No/denies pain  See FIM for current functional status  Therapy/Group: Individual Therapy  Kerney ElbeVarner, Sameeha Rockefeller A 05/23/2014, 2:53 PM

## 2014-05-23 NOTE — Progress Notes (Signed)
Speech Language Pathology Daily Session Note  Patient Details  Name: Mickel Croworma Vaden MRN: [id number] Date of Birth: Dec 16, 1918  Today's Date: 05/23/2014 SLP Individual Time: 1000-1100 SLP Individual Time Calculation (min): 60 min  Short Term Goals: Week 2: SLP Short Term Goal 1 (Week 2): Patient will consume current diet with minimal overt s/s of aspiration with Min A multimodal cues for use of swallowing compensatory straegies. SLP Short Term Goal 1 - Progress (Week 2): Progressing toward goal SLP Short Term Goal 2 (Week 2): Patient will demonstrate efficient mastication of Dys. 3 textures evidenced by minimal oral residue without overt s/s of aspiration with Min A multimodal cues.  SLP Short Term Goal 2 - Progress (Week 2): Progressing toward goal SLP Short Term Goal 3 (Week 2): Patient will utilize external memory aids to recall new, daily information with Mod A multimodal cues.  SLP Short Term Goal 3 - Progress (Week 2): Progressing toward goal SLP Short Term Goal 4 (Week 2): Patient will identify 2 cognitive and 2 physical changes with Min A multimodal cues.  SLP Short Term Goal 4 - Progress (Week 2): Progressing toward goal SLP Short Term Goal 5 (Week 2): Patient will utilize word-finding strategies at the phrase level with Min A multimodal cues.  SLP Short Term Goal 5 - Progress (Week 2): Progressing toward goal SLP Short Term Goal 6 (Week 2): Patient will utilize speech intelligibility strategies at the sentence level with Mod I.  SLP Short Term Goal 6 - Progress (Week 2): Progressing toward goal  Skilled Therapeutic Interventions: Skilled treatment session focused on cognitive-linguistic goals.  SLP facilitated session by providing Max A multimodal cues for mildly complex, generative naming tasks, however, suspect function with task impacted by attention and motivation.  However, patient's word-finding appeared [facility] during functional conversation. Patient was oriented to place, time and  situation and was able to identify 1 physical and 1 cognitive impairment with supervision question cues.  Patient also demonstrated increased overall speech intelligibility today and was 100% intelligible at the conversation level. Patient left in wheelchair with quick release belt in place and all needs within reach. Continue with current plan of care.    FIM:  Comprehension Comprehension Mode: Auditory Comprehension: 5-Follows basic conversation/direction: With no assist Expression Expression Mode: Verbal Expression: 4-Expresses basic 75 - 89% of the time/requires cueing 10 - 24% of the time. Needs helper to occlude trach/needs to repeat words. Social Interaction Social Interaction: 5-Interacts appropriately 90% of the time - Needs monitoring or encouragement for participation or interaction. Problem Solving Problem Solving: 3-Solves basic 50 - 74% of the time/requires cueing 25 - 49% of the time Memory Memory: 2-Recognizes or recalls 25 - 49% of the time/requires cueing 51 - 75% of the time  Pain Pain Assessment Pain Assessment: No/denies pain  Therapy/Group: Individual Therapy  Kahlan Engebretson 05/23/2014, 4:59 PM

## 2014-05-23 NOTE — Plan of Care (Signed)
Problem: RH BOWEL ELIMINATION Goal: RH STG MANAGE BOWEL WITH ASSISTANCE STG Manage Bowel with min.assist  Goal changed to min. assist

## 2014-05-24 ENCOUNTER — Inpatient Hospital Stay (HOSPITAL_COMMUNITY): Payer: Medicare Other | Admitting: Speech Pathology

## 2014-05-24 ENCOUNTER — Inpatient Hospital Stay (HOSPITAL_COMMUNITY): Payer: Medicare Other | Admitting: Occupational Therapy

## 2014-05-24 ENCOUNTER — Inpatient Hospital Stay (HOSPITAL_COMMUNITY): Payer: Medicare Other | Admitting: Physical Therapy

## 2014-05-24 LAB — GLUCOSE, CAPILLARY
Glucose-Capillary: 103 mg/dL — ABNORMAL HIGH (ref 70–99)
Glucose-Capillary: 118 mg/dL — ABNORMAL HIGH (ref 70–99)
Glucose-Capillary: 134 mg/dL — ABNORMAL HIGH (ref 70–99)
Glucose-Capillary: 129 mg/dL — ABNORMAL HIGH (ref 70–99)

## 2014-05-24 NOTE — Progress Notes (Signed)
Physical Therapy Session Note  Patient Details  Name: [name] MRN: [id number] Date of Birth: 1918/12/12  Today's Date: 05/24/2014 PT Individual Time: 1400-1500 PT Individual Time Calculation (min): 60 min   Short Term Goals: Week 2:  PT Short Term Goal 1 (Week 2): = LTGs due to anticipated LOS  Skilled Therapeutic Interventions/Progress Updates:   Session focused on functional ambulation, R NMR, coordination, strengthening, and activity tolerance. Patient propelled wheelchair using BLE only (cushion removed) x 150 ft with supervision and min cues for sequencing. Gait using RW with R hand splint 2 x 75 ft + 100 ft with close supervision and max verbal cues for upright posture and keeping RW closer to body. Performed sit <> stand 2 x 5 on couch in ADL apartment with rest break between. Standing BLE therex using RW for UE support: marching x 20, heel raises x 20, hip abduction x 10 each LE with seated rest between exercises. Stair training up/down 6 steps using LUE on rail with step-to pattern and min A with cues to place whole foot on step before stepping. Car transfer to sedan height using RW with supervision overall and max cues for sequencing. Standing balance using RW for RUE support reaching up and outside BOS with LUE to grab object and toss to target, able to maintain standing x 90 sec before fatiguing and requiring seated rest break. Patient ambulated from ortho gym > regular gym and regular gym > room using RW with R hand splint with supervision and requested to return to bed. Patient continues to require total cues for sequencing use of R hand splint. Patient left semi reclined in bed with all needs within reach and bed alarm on.  Therapy Documentation Precautions:  Precautions Precautions: Fall Precaution Comments: pacemaker Restrictions Weight Bearing Restrictions: No Pain: Pain Assessment Pain Assessment: No/denies pain  See FIM for current functional  status  Therapy/Group: Individual Therapy  Kerney ElbeVarner, Onya Eutsler A 05/24/2014, 3:00 PM

## 2014-05-24 NOTE — Progress Notes (Signed)
Total 640cc po fluid intake.  IVF's started per orders.

## 2014-05-24 NOTE — Progress Notes (Signed)
[place] PHYSICAL MEDICINE & REHABILITATION     PROGRESS NOTE    Subjective/Complaints: Slept well.  Currently denies pain, sob, cp,nvd  Objective: Vital Signs: Blood pressure 147/55, pulse 60, temperature 98.8 F (37.1 C), temperature source Oral, resp. rate 18, SpO2 98 %. No results found. No results for input(s): WBC, HGB, HCT, PLT in the last 72 hours.  Recent Labs  05/23/14 0703  NA 138  K 3.9  CL 107  GLUCOSE 103*  BUN 18  CREATININE 1.22*  CALCIUM 8.9   CBG (last 3)   Recent Labs  05/23/14 1639 05/23/14 2045 05/24/14 0647  GLUCAP 121* 144* 103*    Wt Readings from Last 3 Encounters:  05/13/14 54.7 kg (120 lb 9.5 oz)  10/24/13 58.06 kg (128 lb)  10/16/13 58.423 kg (128 lb 12.8 oz)    Physical Exam:  Gen: pt pleasant, alert, looks younger than stated age HENT: oral mucosa pink, dentition good Eyes: EOM are normal.  Neck: Normal range of motion. Neck supple. No thyromegaly present.  Cardiovascular:  Cardiac rate controlled without murmur Respiratory: Effort normal and breath sounds normal. No respiratory distress.no wheezes, rales  GI: Soft. Bowel sounds are normal. She exhibits no distension. No tenderness Neurological: She is alert and oriented to place and reason she's here. Right central 7 and mild tongue deviation. She is hard of hearing bilaterally but still is able to converse without additional effort or volume. She followed all simple commands. There wasa Occasionally delay in processing and language , but this appears much improved from prior notes in chart. RUE: 2- to 2/5 deltoid, biceps, triceps, trace to 0/5 wrist and hand. RLE: + to 3- hf, ke, and adfl/apf. Sensation appeared to be grossly intact to light touch and pain in right upper, lower, and face.   Right tone: DTR's 3+, toes up. foot is hypersentitive toutouch. 5 minus/5 in the left deltoid, biceps, triceps, grip, hip flexor, knee extensor, ankle dorsi flexors and plantar  flexion Psych: pleasant, confused, generally redirectable     Assessment/Plan: 1. 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 Physiatrist is providing close team supervision and 24 hour management of active medical problems listed below. Physiatrist and rehab team continue to assess barriers to discharge/monitor patient progress toward functional and medical goals.     FIM: FIM - Bathing Bathing Steps Patient Completed: Chest, Right Arm, Abdomen, Front perineal area, Right upper leg, Left upper leg, Right lower leg (including foot), Left lower leg (including foot), Buttocks Bathing: 4: Min-Patient completes 8-9 62f10 parts or 75+ percent  FIM - Upper Body Dressing/Undressing Upper body dressing/undressing steps patient completed: Thread/unthread right bra strap, Thread/unthread left bra strap, Thread/unthread left sleeve of pullover shirt/dress, Put head through opening of pull over shirt/dress, Thread/unthread right sleeve of pullover shirt/dresss, Pull shirt over trunk Upper body dressing/undressing: 4: Min-Patient completed 75 plus % of tasks FIM - Lower Body Dressing/Undressing Lower body dressing/undressing steps patient completed: Thread/unthread right underwear leg, Thread/unthread right pants leg, Don/Doff left shoe, Thread/unthread left underwear leg, Thread/unthread left pants leg Lower body dressing/undressing: 3: Mod-Patient completed 50-74% of tasks  FIM - Toileting Toileting steps completed by patient: Performs perineal hygiene Toileting Assistive Devices: Grab bar or rail for support Toileting: 2: Max-Patient completed 1 of 3 steps  FIM - TRadio producerDevices: Grab bars Toilet Transfers: 4-To toilet/BSC: Min A (steadying Pt. >  75%), 4-From toilet/BSC: Min A (steadying Pt. > 75%)  FIM - Bed/Chair  Transfer Bed/Chair Transfer Assistive Devices: Arm rests Bed/Chair Transfer: 0: Activity did not occur  FIM - Locomotion: Wheelchair Distance: 150 Locomotion: Wheelchair: 0: Activity did not occur FIM - Locomotion: Ambulation Locomotion: Ambulation Assistive Devices: Other (comment) (no AD, GiveMohr RUE) Ambulation/Gait Assistance: 5: Supervision, 4: Min assist Locomotion: Ambulation: 0: Activity did not occur  Comprehension Comprehension Mode: Auditory Comprehension: 5-Follows basic conversation/direction: With no assist  Expression Expression Mode: Verbal Expression: 4-Expresses basic 75 - 89% of the time/requires cueing 10 - 24% of the time. Needs helper to occlude trach/needs to repeat words.  Social Interaction Social Interaction: 5-Interacts appropriately 90% of the time - Needs monitoring or encouragement for participation or interaction.  Problem Solving Problem Solving: 3-Solves basic 50 - 74% of the time/requires cueing 25 - 49% of the time  Memory Memory: 2-Recognizes or recalls 25 - 49% of the time/requires cueing 51 - 75% of the time  Medical Problem List and Plan: 1. Functional deficits secondary to cardio-embolic, left MCA branch infarct with hemorrhagic transformation after TPA  -team conference today 2. DVT Prophylaxis/Anticoagulation: SCDs. Monitor for any signs of DVT 3. Pain Management: Tylenol as needed---minimal pain at present 4. Mood/history of memory loss/mild dementia: Aricept 5 mg daily at bedtime  -exacerbated by cva/acute hospitalization 5. Neuropsych: This patient is not capable of making decisions on her own behalf. 6. Skin/Wound Care: Routine skin checks 7. Fluids/Electrolytes/Nutrition:   Encouraging po fluids.   -see below 8. Dysphagia:. Dysphagia 2 thin liquids. advance per speech therapy, poor fluid intake, monitor be met as well as I's and O's, may need IV fluid at night 9. Hypertension/atrial fibrillation/pacemaker. Cardizem CD 120 mg  daily,Multaq 400 mg twice a day, cardiac rate control 10.Bilat carotid Stenosis. VVS to see to help determine potential surgical needs moving forward 11. Hypothyroidism. Synthroid 12. Diabetes mellitus of peripheral neuropathy. Hemoglobin A1c 7.5. Sliding scale insulin.   -sugars generally acceptable at present 13. Chronic renal insufficiency/mild acute renal insufficiency. Baseline creatinine 1.36  -  cont IVF 4m/hr x 12 during the night if po fluid intake < 1003mduring the day  -continue to encourage PO  -megace has helped appetite. Liquid intake still not optimal  -labs holding steady 14. CAD/NSTEMI/angioplasty. Continue aspirin. No chest pain or shortness of breath 15. Hyperlipidemia. Zetia, Crestor    LOS (Days) 11 A FACE TO FACE EVALUATION WAS PERFORMED  Zyon Grout T 05/24/2014 8:17 AM

## 2014-05-24 NOTE — Progress Notes (Signed)
Speech Language Pathology Daily Session Note  Patient Details  Name: [name] MRN: [id number] Date of Birth: 1918-05-15  Today's Date: 05/24/2014 SLP Individual Time: 1300-1400 SLP Individual Time Calculation (min): 60 min  Short Term Goals: Week 2: SLP Short Term Goal 1 (Week 2): Patient will consume current diet with minimal overt s/s of aspiration with Min A multimodal cues for use of swallowing compensatory straegies. SLP Short Term Goal 1 - Progress (Week 2): Progressing toward goal SLP Short Term Goal 2 (Week 2): Patient will demonstrate efficient mastication of Dys. 3 textures evidenced by minimal oral residue without overt s/s of aspiration with Min A multimodal cues.  SLP Short Term Goal 2 - Progress (Week 2): Progressing toward goal SLP Short Term Goal 3 (Week 2): Patient will utilize external memory aids to recall new, daily information with Mod A multimodal cues.  SLP Short Term Goal 3 - Progress (Week 2): Progressing toward goal SLP Short Term Goal 4 (Week 2): Patient will identify 2 cognitive and 2 physical changes with Min A multimodal cues.  SLP Short Term Goal 4 - Progress (Week 2): Progressing toward goal SLP Short Term Goal 5 (Week 2): Patient will utilize word-finding strategies at the phrase level with Min A multimodal cues.  SLP Short Term Goal 5 - Progress (Week 2): Progressing toward goal SLP Short Term Goal 6 (Week 2): Patient will utilize speech intelligibility strategies at the sentence level with Mod I.  SLP Short Term Goal 6 - Progress (Week 2): Progressing toward goal  Skilled Therapeutic Interventions: Skilled treatment session focused on dysphagia and cognitive goals. Upon arrival, patient was consuming lunch meal of Dys. 2 textures with thin liquids. Patient utilized small bites/sips t and attended to anterior spillage throughout meal with supervision verbal cues. Patient transferred to wheelchair and required Mod A verbal cues for safety. Patient  participated in a mildly complex, new learning task and required Mod A verbal cues for problem solving and for recall of events from morning therapy sessions. Patient was independently oriented to place and situation and required Min A question cues for orientation to date and to identify 1 deficit and 1 goal that she is working on in all 3 therapy disciplines. Patient left in wheelchair with quick release belt in place and all needs within reach. Continue with current plan of care.    FIM:  Comprehension Comprehension Mode: Auditory Comprehension: 5-Follows basic conversation/direction: With no assist Expression Expression Mode: Verbal Expression: 5-Expresses basic 90% of the time/requires cueing < 10% of the time. Social Interaction Social Interaction: 5-Interacts appropriately 90% of the time - Needs monitoring or encouragement for participation or interaction. Problem Solving Problem Solving: 3-Solves basic 50 - 74% of the time/requires cueing 25 - 49% of the time Memory Memory: 3-Recognizes or recalls 50 - 74% of the time/requires cueing 25 - 49% of the time FIM - Eating Eating Activity: 5: Needs verbal cues/supervision  Pain Pain Assessment Pain Assessment: No/denies pain  Therapy/Group: Individual Therapy  Bryceton Hantz 05/24/2014, 4:17 PM

## 2014-05-24 NOTE — Progress Notes (Signed)
Physical Therapy Session Note  Patient Details  Name: [name] MRN: [id number] Date of Birth: May 06, 1918  Today's Date: 05/24/2014 PT Individual Time: 0900-0930 PT Individual Time Calculation (min): 30 min   Short Term Goals: Week 2:  PT Short Term Goal 1 (Week 2): = LTGs due to anticipated LOS  Skilled Therapeutic Interventions/Progress Updates:    Gait Training: PT instructs pt in ambulation with RW and R hand splint req CGA for safety x 100' with repeated VCs to stand tall, which pt immediately does, but progressively fades into a forward flexed trunk posture. PT instructs pt in ascending/descending 4 (6" height) stairs with B rails (PT hand over pt's R hand to assist with placement) req min A, seated rest, then up/down 6 stairs with B rails in step-to manner with min A, rest break, then back over the stairs in the same manner - all req min A.  SaO2 on RA drops to 90% after gait on level surface. SaO2 on RA drops to 88% with up & over long staircase, but pt recovers to > 90% in 2 seconds after rest break.   Neuromuscular Reeducation: PT instructs pt in TUG x 3 reps and pt scores: 25 seconds, 27 seconds, and 26 seconds.  PT instructs pt in hip strengthening and pre-falls recovery practice: short to tall kneel x 10 reps with hands on bench for support req min A.  Pt demonstrates poor memory, but works hard throughout session. Balance and safety awareness need more work - continue per PT pOC.   Therapy Documentation Precautions:  Precautions Precautions: Fall Precaution Comments: pacemaker Restrictions Weight Bearing Restrictions: No Vital Signs: See Note Pain: Pain Assessment Pain Assessment: No/denies pain  Balance: Balance Balance Assessed: Yes Standardized Balance Assessment Standardized Balance Assessment: Timed Up and Go Test Timed Up and Go Test TUG: Normal TUG Normal TUG (seconds): 25  See FIM for current functional status  Therapy/Group: Individual  Therapy  Timica Marcom M 05/24/2014, 8:43 AM

## 2014-05-24 NOTE — Progress Notes (Signed)
Occupational Therapy Session Note  Patient Details  Name: [name] MRN: [id number] Date of Birth: 1918-11-17  Today's Date: 05/24/2014 OT Individual Time: 2903-7955 OT Individual Time Calculation (min): 60 min    Short Term Goals: Week 1:  OT Short Term Goal 1 (Week 1): Pt will complete bathing at min assist level OT Short Term Goal 1 - Progress (Week 1): Met OT Short Term Goal 2 (Week 1): Pt will complete UB dressing with mod assist and cues for hemi dressing technique OT Short Term Goal 2 - Progress (Week 1): Met OT Short Term Goal 3 (Week 1): Pt will complete LB dressing with mod assist OT Short Term Goal 3 - Progress (Week 1): Met OT Short Term Goal 4 (Week 1): Pt completed bathing and dressing with mod cues for positioning of RUE OT Short Term Goal 4 - Progress (Week 1): Not met Week 2:  OT Short Term Goal 1 (Week 2): Focus on LTGs due to discharge set for 5/9  Skilled Therapeutic Interventions/Progress Updates:    1:1 self care retraining at shower level with focus on functional ambulation without AD challenging balance, sit to stands with maintaining forward weight shift to maintain balance, functional problem solving, functional use of right UE with mod A distally with bathing and dressing tasks and self feeding with non dominant hand while maintaining swallowing precautions etc. Pt ambulation with min A with extra time. Pt required mod encouragement to incorporate right hand in all tasks with tactile cues for normal patterns of movement.   Therapy Documentation Precautions:  Precautions Precautions: Fall Precaution Comments: pacemaker Restrictions Weight Bearing Restrictions: No Pain: Pain Assessment Pain Assessment: No/denies pain  See FIM for current functional status  Therapy/Group: Individual Therapy  Willeen Cass [facility] 05/24/2014, 1:02 PM

## 2014-05-25 ENCOUNTER — Inpatient Hospital Stay (HOSPITAL_COMMUNITY): Payer: Medicare Other | Admitting: Physical Therapy

## 2014-05-25 LAB — GLUCOSE, CAPILLARY
GLUCOSE-CAPILLARY: 130 mg/dL — AB (ref 70–99)
Glucose-Capillary: 105 mg/dL — ABNORMAL HIGH (ref 70–99)
Glucose-Capillary: 128 mg/dL — ABNORMAL HIGH (ref 70–99)
Glucose-Capillary: 140 mg/dL — ABNORMAL HIGH (ref 70–99)

## 2014-05-25 NOTE — Progress Notes (Signed)
[place] PHYSICAL MEDICINE & REHABILITATION     PROGRESS NOTE    Subjective/Complaints: Patient sitting comfortably in wheelchair. Head therapy this morning. Had incontinent avoid this morning. Will drink when cued but does not spontaneously try to do this.  Does not remember me from last week Drank ~534m yesterday, meal intake >50% Review of systems is limited by a aphasia, she does deny shortness of breath or any pains  Objective: Vital Signs: Blood pressure 148/52, pulse 60, temperature 98.3 F (36.8 C), temperature source Oral, resp. rate 18, SpO2 97 %. No results found. No results for input(s): WBC, HGB, HCT, PLT in the last 72 hours.  Recent Labs  05/23/14 0703  NA 138  K 3.9  CL 107  GLUCOSE 103*  BUN 18  CREATININE 1.22*  CALCIUM 8.9   CBG (last 3)   Recent Labs  05/24/14 1636 05/24/14 2105 05/25/14 0646  GLUCAP 134* 129* 105*    Wt Readings from Last 3 Encounters:  05/13/14 54.7 kg (120 lb 9.5 oz)  10/24/13 58.06 kg (128 lb)  10/16/13 58.423 kg (128 lb 12.8 oz)    Physical Exam:  Gen: pt pleasant, alert,  HENT: oral mucosa pink, dentition good Eyes: EOM are normal.  Neck: Normal range of motion. Neck supple. No thyromegaly present.  Cardiovascular:  Cardiac rate controlled without murmur Respiratory: Effort normal and breath sounds normal. No respiratory distress.no wheezes, rales  GI: Soft. Bowel sounds are normal. She exhibits no distension. No tenderness Neurological: She is alert and oriented to place and reason she's here. Right central 7 and mild tongue deviation. She is hard of hearing bilaterally but still is able to converse without additional effort or volume. She followed all simple commands. There wasa Occasionally delay in processing and language , but this appears much improved from prior notes in chart. RUE: 2- to 2/5 deltoid, biceps, triceps, trace to 0/5 wrist and hand. RLE: 4- hf, ke, and adfl/apf. Sensation appeared to be  grossly intact to light touch and pain in right upper, lower, and face.   Right tone: DTR's 3+, toes up. foot is hypersentitive toutouch. 5 minus/5 in the left deltoid, biceps, triceps, grip, hip flexor, knee extensor, ankle dorsi flexors and plantar flexion Psych: pleasant, confused,     Assessment/Plan: 1. 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 Physiatrist is providing close team supervision and 24 hour management of active medical problems listed below. Physiatrist and rehab team continue to assess barriers to discharge/monitor patient progress toward functional and medical goals.     FIM: FIM - Bathing Bathing Steps Patient Completed: Chest, Right Arm, Abdomen, Front perineal area, Buttocks, Left lower leg (including foot), Right lower leg (including foot), Right upper leg, Left upper leg Bathing: 4: Min-Patient completes 8-9 029f0 parts or 75+ percent  FIM - Upper Body Dressing/Undressing Upper body dressing/undressing steps patient completed: Thread/unthread right bra strap, Thread/unthread left bra strap, Thread/unthread left sleeve of pullover shirt/dress, Put head through opening of pull over shirt/dress, Thread/unthread right sleeve of pullover shirt/dresss, Pull shirt over trunk Upper body dressing/undressing: 4: Min-Patient completed 75 plus % of tasks FIM - Lower Body Dressing/Undressing Lower body dressing/undressing steps patient completed: Thread/unthread right underwear leg, Thread/unthread right pants leg, Don/Doff left shoe, Thread/unthread left underwear leg, Thread/unthread left pants leg, Don/Doff right shoe Lower body dressing/undressing: 4: Min-Patient completed 75 plus % of tasks  FIM -  Toileting Toileting steps completed by patient: Performs perineal hygiene Toileting Assistive Devices: Grab bar or rail for  support Toileting: 3: Mod-Patient completed 2 of 3 steps  FIM - Radio producer Devices: Grab bars Toilet Transfers: 4-To toilet/BSC: Min A (steadying Pt. > 75%), 4-From toilet/BSC: Min A (steadying Pt. > 75%)  FIM - Bed/Chair Transfer Bed/Chair Transfer Assistive Devices: Walker, Orthosis, Arm rests (R HO) Bed/Chair Transfer: 5: Chair or W/C > Bed: Supervision (verbal cues/safety issues), 5: Bed > Chair or W/C: Supervision (verbal cues/safety issues), 6: Sit > Supine: No assist  FIM - Locomotion: Wheelchair Distance: 150 Locomotion: Wheelchair: 3: Travels 150 ft or more: maneuvers on rugs and over door sills with moderate assistance  (Pt: 50 - 74%) FIM - Locomotion: Ambulation Locomotion: Ambulation Assistive Devices: Walker - Rolling, Orthosis (hand orthosis) Ambulation/Gait Assistance: 5: Supervision Locomotion: Ambulation: 5: Travels 150 ft or more with supervision/safety issues  Comprehension Comprehension Mode: Auditory Comprehension: 5-Follows basic conversation/direction: With no assist  Expression Expression Mode: Verbal Expression: 5-Expresses basic 90% of the time/requires cueing < 10% of the time.  Social Interaction Social Interaction: 5-Interacts appropriately 90% of the time - Needs monitoring or encouragement for participation or interaction.  Problem Solving Problem Solving: 3-Solves basic 50 - 74% of the time/requires cueing 25 - 49% of the time  Memory Memory: 3-Recognizes or recalls 50 - 74% of the time/requires cueing 25 - 49% of the time  Medical Problem List and Plan: 1. Functional deficits secondary to cardio-embolic, left MCA branch infarct with hemorrhagic transformation after TPA   2. DVT Prophylaxis/Anticoagulation: SCDs. Monitor for any signs of DVT 3. Pain Management: Tylenol as needed---minimal pain at present 4. Mood/history of memory loss/mild dementia: Aricept 5 mg daily at bedtime  -exacerbated by cva/acute  hospitalization 5. Neuropsych: This patient is not capable of making decisions on her own behalf. 6. Skin/Wound Care: Routine skin checks 7. Fluids/Electrolytes/Nutrition:   Encouraging po fluids.   -see below 8. Dysphagia:. Dysphagia 2 thin liquids. advance per speech therapy, poor fluid intake, monitor be met as well as I's and O's,  IV fluid at night 9. Hypertension/atrial fibrillation/pacemaker. Cardizem CD 120 mg daily,Multaq 400 mg twice a day, cardiac rate control 10.Bilat carotid Stenosis. VVS to see to help determine potential surgical needs moving forward 11. Hypothyroidism. Synthroid 12. Diabetes mellitus of peripheral neuropathy. Hemoglobin A1c 7.5. Sliding scale insulin.   -sugars generally acceptable at present 13. Chronic renal insufficiency/mild acute renal insufficiency. Baseline creatinine 1.36  -  cont IVF 49m/hr x 12 during the night if po fluid intake < 10029mduring the day  -continue to encourage PO  -megace has helped appetite. Liquid intake still not optimal  -recheck BMET in am 14. CAD/NSTEMI/angioplasty. Continue aspirin. No chest pain or shortness of breath 15. Hyperlipidemia. Zetia, Crestor    LOS (Days) 12 A FACE TO FACE EVALUATION WAS PERFORMED  KIRSTEINS,ANDREW E 05/25/2014 10:50 AM

## 2014-05-25 NOTE — Discharge Summary (Signed)
NAMMickel [name]:  [name] [name]              ACCOUNT NO.:  [id number]  MEDICAL RECORD NO.:  [id number]  LOCATION:  [id number]                        FACILITY:  [facility]  PHYSICIAN:  Mariam Dollaraniel Ilyse Tremain, P.A.  DATE OF BIRTH:  May 20, 1918  DATE OF ADMISSION:  05/13/2014 DATE OF DISCHARGE:  05/27/2014                              DISCHARGE SUMMARY   DISCHARGE DIAGNOSES: 1. Functional deficits secondary to cardioembolic left MCA branch     infarct with hemorrhagic transformation after tPA. 2. Sequential compression devices for DVT prophylaxis. 3. Dysphagia. 4. Hypertension with atrial fibrillation and pacemaker. 5. Bilateral carotid stenosis. 6. Hypothyroidism. 7. Diabetes mellitus with peripheral neuropathy. 8. Chronic renal insufficiency with baseline creatinine 1.36. 9. Coronary artery disease with a NSTEMI angioplasty. 10.Hyperlipidemia.  HISTORY OF PRESENT ILLNESS:  This is a 81 year old right-handed female with chronic renal insufficiency with creatinine 1.36, coronary artery disease with angioplasty, atrial fibrillation with pacemaker maintained on aspirin.  She had been on Coumadin in [name] past, but discontinued due to GI bleed.  [name]  Independent prior to admission.  Presented on May 08, 2014, with altered mental status, slurred speech, right facial droop, and right-sided weakness.  Initial cranial CT scan showed no acute intracranial abnormality.  She did receive tPA.  [name] [name] with increased right-sided hemiparesis as [name] as expressive aphasia.  Repeat CT of [name] head showed multiple new parenchymal and subarachnoid bleeds since prior study.  Echocardiogram with ejection fraction of 55%.  No wall motion abnormalities and without embolism.  Carotid Dopplers with 40-59% ICA stenosis.  Maintained on a dysphagia #2 thin liquid diet. Neurology consulted maintained on aspirin 81 mg daily.  Physical and occupational therapy ongoing. [name]  [name] was admitted for comprehensive rehab program.  PAST MEDICAL HISTORY:  See discharge diagnoses.  SOCIAL HISTORY:  She is a resident of [name], independent prior to admission.  Functional status upon admission to rehab services was +2 physical assist sit to stand ,+2 physical assist stand pivot transfers, neglect of right upper extremity, mod and max assist activities of daily living.  PHYSICAL EXAMINATION:  VITAL SIGNS:  Blood pressure 128/85, pulse 73, temperature 98, respirations 19. GENERAL: This was an alert female, no acute distress, oriented to place. Right central 7 and mild tongue deviation. She was somewhat hard of hearing.  She follows simple commands.  Occasional delay in processing and language. LUNGS:  Clear to auscultation. CARDIAC: Regular rate and rhythm. ABDOMEN:  Soft, nontender.  Good bowel sounds.  REHABILITATION HOSPITAL COURSE:  [name] [name] was admitted to inpatient rehab services with therapies initiated on a 3-hour daily basis consisting of physical therapy, occupational therapy, speech therapy, and rehabilitation nursing.  [name] following issues were addressed during [name] [name]'s rehabilitation stay.  Pertaining to [name] [name]'s cardioembolic left MCA branch infarction remained stable, maintained on aspirin therapy, she would follow up with Neurology Services. Sequential compression devices for DVT prophylaxis.  She did have a documented history of memory loss, she continued on Aricept.  Diet was slowly advanced to a dysphagia #2 thin liquid diet which she was tolerating [name], no signs of aspiration.  Blood pressures, heart rate control  with Cardizem as [name] as Multaq.  She did receive followup from vascular surgery Dr. Karlene LinemanEarley with findings of bilateral carotid stenosis which would be addressed as an outpatient.  She remained on Synthroid for hypothyroidism.  Her blood sugars overall are [name] controlled  and monitored.  Chronic renal insufficiency, baseline creatinine 1.36.  She did receive IV fluids for short time as creatinine function improved. She had no chest pain or shortness of breath.  [name] [name] received weekly collaborative interdisciplinary team conferences to discuss estimated length of stay, family teaching, and any barriers to discharge.  Sessions focused on education, safety and mobility, need for functional activities requiring redirection was some multiple times for her sessions.  Performed static standing.  Performed anterior weight shifts.  Instructed and ambulation rolling walker and right hand splint requiring contact guard assist for safety 100 feet with repeated verbal cues.  Ascending and descended stairs with minimal assistance. Activities of daily living, self-care retraining, and shower level with focus on functional ambulation without assistive device.  [name] [name] continued to progress nicely and plan was to return back to [name] facility.  DISCHARGE MEDICATIONS:  Included; 1. Tylenol as needed 2. Aspirin 81 mg p.o. daily. 3. Cardizem CD 120 mg p.o. daily. 4. Aricept 5 mg p.o. at bedtime. 5. Multaq 400 mg p.o. b.i.d. 6. Zetia 10 mg p.o. daily. 7. Synthroid 25 mcg p.o. daily. 8. Crestor 5 mg p.o. daily.  DIET:  Her diet was a dysphagia #2 thin liquid diet, diabetic restrictions.  FOLLOWUP:  [name] [name] would follow up with Dr. Faith RogueZachary Swartz at [name] as advised  July 12, 2014.  Dr. Roda ShuttersXu, Neurology Services call for appointment; Dr. Tawanna Coolerodd Early in regard to [name] ICA stenosis, Dr. Merlene LaughterHal Stoneking medical management.     Mariam Dollaraniel Shanteria Laye, P.A.     DA/MEDQ  D:  05/25/2014  T:  05/25/2014  Job:  [id number]  cc:   Hal T. Pete GlatterStoneking, M.D. Marvel PlanJindong Xu, MD

## 2014-05-25 NOTE — Progress Notes (Signed)
Physical Therapy Session Note  Patient Details  Name: [name] MRN: [id number] Date of Birth: 25-Dec-1918  Today's Date: 05/25/2014 PT Individual Time: 0900-1000 PT Individual Time Calculation (min): 60 min   Short Term Goals: Week 1:  PT Short Term Goal 1 (Week 1): = LTGs of overall supervision Week 2:  PT Short Term Goal 1 (Week 2): = LTGs due to anticipated LOS  Skilled Therapeutic Interventions/Progress Updates:   Pt limited at points in session by complaints of fatigue. Pt particularly limited in forced use and functional activities. Pt with improved performance when she carries over cues for posture, but many times carryover limited by attention. Pt would continue to benefit from skilled PT services to increase functional mobility.   Therapy Documentation Precautions:  Precautions Precautions: Fall Precaution Comments: pacemaker Restrictions Weight Bearing Restrictions: No Mobility:  Transfers with cues for posture, safety, and technique Locomotion : Ambulation Ambulation/Gait Assistance: 3: Mod assist (10' with cues for posture, attention, safety, and technique)  Other Treatments:  Pt educated on rehab plan, safety in mobility, need for functional activities and forced uses. Pt requires redirection onto task multiple times in session. Pt performs transfers x15 in session. Pt performs Static standing 30"x5. Pt performs BLE pre-gait 3x5. Pt performs anterior weight shifts 3x10. Pt performs unsupported sitting as active rest. Scap mobs performed in all directions followed by AAROM. LAQs, marching, hip ER AROM, ankle pumps 2x10 performed.  See FIM for current functional status  Therapy/Group: Individual Therapy  Christia ReadingKinney, Marshun Duva G 05/25/2014, 12:43 PM

## 2014-05-25 NOTE — Discharge Summary (Signed)
Discharge summary job # [id number]

## 2014-05-26 ENCOUNTER — Inpatient Hospital Stay (HOSPITAL_COMMUNITY): Payer: Medicare Other

## 2014-05-26 ENCOUNTER — Inpatient Hospital Stay (HOSPITAL_COMMUNITY): Payer: Medicare Other | Admitting: Physical Therapy

## 2014-05-26 DIAGNOSIS — E1140 Type 2 diabetes mellitus with diabetic neuropathy, unspecified: Secondary | ICD-10-CM

## 2014-05-26 LAB — BASIC METABOLIC PANEL
Anion gap: 11 (ref 5–15)
BUN: 14 mg/dL (ref 6–20)
CO2: 18 mmol/L — AB (ref 22–32)
Calcium: 9.1 mg/dL (ref 8.9–10.3)
Chloride: 107 mmol/L (ref 101–111)
Creatinine, Ser: 1.18 mg/dL — ABNORMAL HIGH (ref 0.44–1.00)
GFR calc Af Amer: 45 mL/min — ABNORMAL LOW (ref >60)
GFR, EST NON AFRICAN AMERICAN: 39 mL/min — AB (ref >60)
GLUCOSE: 118 mg/dL — AB (ref 70–99)
Potassium: 3.9 mmol/L (ref 3.5–5.1)
SODIUM: 136 mmol/L (ref 135–145)

## 2014-05-26 LAB — GLUCOSE, CAPILLARY: Glucose-Capillary: 107 mg/dL — ABNORMAL HIGH (ref 70–99)

## 2014-05-26 NOTE — Progress Notes (Signed)
Occupational Therapy Discharge Summary  Patient Details  Name: Leonia Heatherly MRN: [id number] Date of Birth: 03-02-1918  Today's Date: 05/26/2014 OT Individual Time:0900-1000 and  [id number] OT Individual Time Calculation (min): 55 min and 60 min    Patient has met 11 of 13 long term goals due to improved activity tolerance, improved balance, postural control, ability to compensate for deficits, functional use of  RIGHT lower extremity, improved attention, improved awareness and improved coordination.  Patient to discharge at overall min assist level with mod assist toileting task.  Patient's care partner not necessary to provide the necessary physical and cognitive assistance at discharge secondary to patient discharging to ILF/SNF.    Reasons goals not met: Patient did not meet her goal for functional use of RUE secondary to patient requiring mod-max A and max cues for use of RUE at a gross assist level.  Patient also requires max cues for emergent awareness.   Recommendation:  Patient will benefit from ongoing skilled OT services in skilled nursing facility setting to continue to advance functional skills in the area of BADLs, balance, cognitive remediation, safety, functional use of RUE, and activity tolerance.  Equipment: No equipment provided  Reasons for discharge: treatment goals met and discharge from hospital  Patient/family agrees with progress made and goals achieved: Yes  Skilled Therapeutic Intervention: Session 1: Pt seen for ADL retraining with focus on functional mobility, standing balance, attention to L, problem solving, and activity tolerance. Pt received supine in bed. Completed supine>sit at supervision level with min cues for sequencing. Completed stand pivot transfer bed>w/c with min A. Pt assisted with picking out clothing for the day from w/c level with min cues for problem solving between clean/dirty clothes. Pt ambulated approx 10 feet room>bathroom with  steadying-SBA and min cues for attention to R and positioning of RUE. Pt did initiated placement of RUE on RW, however required max cues for awareness when completing stand>sit. Pt completed bathing at shower level with SBA for balance and hand over hand assist to wash LUE. Completed dressing sit<>stand from w/c with mod cues for problem solving due to decreased functional use of RUE. At end of session pt left sitting in w/c with all needs in reach.  Session 2: Pt seen for 1:1 OT session with focus on dynamic standing balance, problem solving, attention, safety awareness, functional mobility, and RUE NMR. Pt received supine in bed requesting to complete therapy in room secondary to awaiting family. Discussed leaving note, Paramedic, etc about location of pt if they arrive, however pt continued to wish to complete in room. Pt initiating conversation in regards to transitioning to next venue and discussed therapy, etc. Pt eager to pack belongings therefore incorporated into therapy session. Pt ambulated about room with min A overall to retrieve items from drawer and return to bed. Pt required mod cues for problem solving for placement of clothing on RW during ambulation. Pt stood to place items in suitcase with SBA for balance. Pt retrieved clothing items from bottom drawer from w/c level and placed in bag with mod cues for attention to RUE. Pt completed laundry task in standing at CGA-SBA and increased time for problem solving to select water temperature and cycle. Pt returned to room and engaged in Ceres ranger with focus on shoulder flexion/extension, adduction/absuction and elbow flexion/extension. Engaged in holding task using BUEs to hold ball at 90 degrees shoulder flexion. Pt required assist to achieve position and held at 90 degrees for 5 seconds with  min A. Pt demonstrated improved attention to task as she counted reps and seconds during NMR exercises. Pt returned to bed and left with  all needs in reach.  OT Discharge Precautions/Restrictions  Precautions Precautions: Fall Precaution Comments: pacemaker Restrictions Weight Bearing Restrictions: No General   Vital Signs  Pain Pain Assessment Pain Assessment: No/denies pain ADL   Vision/Perception  Vision- History Baseline Vision/History: Wears glasses Wears Glasses: Reading only Patient Visual Report: No change from baseline Vision- Assessment Vision Assessment?: Yes Eye Alignment: Within Functional Limits Ocular Range of Motion: Within Functional Limits Alignment/Gaze Preference: Within Defined Limits Tracking/Visual Pursuits: Able to track stimulus in all quads without difficulty Saccades: Within functional limits Convergence: Within functional limits Visual Fields: No apparent deficits  Cognition Overall Cognitive Status: Impaired/Different from baseline Arousal/Alertness: Awake/alert Orientation Level: Oriented to person;Oriented to place;Oriented to situation;Disoriented to time Attention: Sustained Sustained Attention: Appears intact Memory: Impaired Memory Impairment: Decreased recall of new information Awareness: Impaired Awareness Impairment: Intellectual impairment;Emergent impairment Problem Solving: Impaired Problem Solving Impairment: Functional basic Reasoning: Impaired Behaviors: Impulsive Safety/Judgment: Impaired Sensation Sensation Light Touch: Impaired Detail Light Touch Impaired Details: Impaired RUE Hot/Cold: Impaired by gross assessment;Impaired Detail Hot/Cold Impaired Details: Impaired RUE Coordination Gross Motor Movements are Fluid and Coordinated: No Fine Motor Movements are Fluid and Coordinated: No Finger Nose Finger Test: unable to complete with RUE Motor  Motor Motor: Hemiplegia;Abnormal postural alignment and control Motor - Skilled Clinical Observations: RUE hemiplegia, forward flexed posture Mobility  Bed Mobility Bed Mobility: Sit to Supine;Supine to  Sit Supine to Sit: 5: Supervision;HOB flat Supine to Sit Details: Verbal cues for sequencing Sit to Supine: 5: Supervision;HOB flat Sit to Supine - Details: Verbal cues for sequencing Transfers Transfers: Stand to Sit;Sit to Stand Sit to Stand: 5: Supervision Sit to Stand Details: Verbal cues for precautions/safety Stand to Sit: 5: Supervision Stand to Sit Details (indicate cue type and reason): Verbal cues for precautions/safety  Trunk/Postural Assessment  Cervical Assessment Cervical Assessment: Exceptions to [facility] (forward head) Thoracic Assessment Thoracic Assessment: Exceptions to [facility] (kyphotic) Lumbar Assessment Lumbar Assessment: Exceptions to [facility] (posterior pelvic tilt) Postural Control Postural Control: Deficits on evaluation Protective Responses: impaired  Balance Balance Balance Assessed: Yes Dynamic Sitting Balance Sitting balance - Comments: mod I Static Standing Balance Static Standing - Balance Support: During functional activity;Right upper extremity supported;Left upper extremity supported Static Standing - Level of Assistance: 5: Stand by assistance Dynamic Standing Balance Dynamic Standing - Balance Support: Right upper extremity supported;During functional activity Dynamic Standing - Level of Assistance: 5: Stand by assistance;4: Min assist Extremity/Trunk Assessment RUE Assessment RUE Assessment: Exceptions to [facility] RUE AROM (degrees) RUE Overall AROM Comments: shoulder flexion and add/abduction limited but WFL; trace elbow flexion against gravity; no AROM at wrist or digits RUE PROM (degrees) Overall PROM Right Upper Extremity: Within functional limits for tasks performed LUE Assessment LUE Assessment: Within Functional Limits  See FIM for current functional status  Minela [place], Quillian Quince 05/26/2014, 9:54 AM

## 2014-05-26 NOTE — Plan of Care (Signed)
Problem: RH Functional Use of Upper Extremity Goal: LTG Patient will use RT/LT upper extremity as a (OT) LTG: Patient will use right/left upper extremity as a stabilizer/gross assist/diminished/nondominant/dominant level with assist, with/without cues during functional activity (OT)  Outcome: Not Met (add Reason) Not met as patient requires max cues and mod-max A to utilize RUE at a gross assist level  Problem: RH Awareness Goal: LTG: Patient will demonstrate intellectual/emergent (OT) LTG: Patient will demonstrate intellectual/emergent/anticipatory awareness with assist during a functional activity (OT)  Outcome: Not Met (add Reason) Not met as pt required mod-max cues for awareness.

## 2014-05-26 NOTE — Progress Notes (Signed)
[facility]ILIT[facility]     PROGRESS NOTE    Subjective/Complaints: Reviewed oral intake, by mouth caloric intake much improved, fluid intake is still on the low side at overall improved. Reviewed repeat be met which has normalized Patient has diet-controlled diabetes, has not needed correction Patient just finished bathing and dressing, required min assist Review of systems is limited by a aphasia, she does deny shortness of breath or any pains  Objective: Vital Signs: Blood pressure 124/56, pulse 67, temperature 98.7 F (37.1 C), temperature source Oral, resp. rate 18, SpO2 98 %. No results found. No results for input(s): WBC, HGB, HCT, PLT in the last 72 hours.  Recent Labs  05/26/14 0645  NA 136  K 3.9  CL 107  GLUCOSE 118*  BUN 14  CREATININE 1.18*  CALCIUM 9.1   CBG (last 3)   Recent Labs  05/25/14 1641 05/25/14 2044 05/26/14 0612  GLUCAP 140* 130* 107*    Wt Readings from Last 3 Encounters:  05/13/14 54.7 kg (120 lb 9.5 oz)  10/24/13 58.06 kg (128 lb)  10/16/13 58.423 kg (128 lb 12.8 oz)    Physical Exam:  Gen: pt pleasant, alert,  HENT: oral mucosa pink, dentition good Eyes: EOM are normal.  Neck: Normal range of motion. Neck supple. No thyromegaly present.  Cardiovascular:  Cardiac rate controlled without murmur Respiratory: Effort normal and breath sounds normal. No respiratory distress.no wheezes, rales  GI: Soft. Bowel sounds are normal. She exhibits no distension. No tenderness Neurological: She is alert and oriented to place and reason she's here. Right central 7 and mild tongue deviation. She is hard of hearing bilaterally but still is able to converse without additional effort or volume. She followed all simple commands. There wasa Occasionally delay in processing and language , but this appears much improved from prior notes in chart. RUE: 2- to 2/5 deltoid, biceps, triceps, trace to 0/5 wrist and hand. RLE: 4- hf,  ke, and adfl/apf. Sensation appeared to be grossly intact to light touch and pain in right upper, lower, and face.   Right tone: DTR's 3+, toes up. foot is hypersentitive toutouch. 5 minus/5 in the left deltoid, biceps, triceps, grip, hip flexor, knee extensor, ankle dorsi flexors and plantar flexion Psych: pleasant, confused,     Assessment/Plan: 1. 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 Physiatrist is providing close team supervision and 24 hour management of active medical problems listed below. Physiatrist and rehab team continue to assess barriers to discharge/monitor patient progress toward functional and medical goals. Ready for D/C in am    FIM: FIM - Bathing Bathing Steps Patient Completed: Chest, Right Arm, Abdomen, Front perineal area, Buttocks, Left lower leg (including foot), Right lower leg (including foot), Right upper leg, Left upper leg Bathing: 4: Min-Patient completes 8-9 [id number] parts or 75+ percent  FIM - Upper Body Dressing/Undressing Upper body dressing/undressing steps patient completed: Thread/unthread right bra strap, Thread/unthread left bra strap, Thread/unthread left sleeve of pullover shirt/dress, Put head through opening of pull over shirt/dress, Thread/unthread right sleeve of pullover shirt/dresss, Pull shirt over trunk Upper body dressing/undressing: 4: Min-Patient completed 75 plus % of tasks FIM - Lower Body Dressing/Undressing Lower body dressing/undressing steps patient completed: Thread/unthread right underwear leg, Thread/unthread right pants leg, Don/Doff left shoe, Thread/unthread left underwear leg, Thread/unthread left pants leg, Don/Doff right shoe Lower body dressing/undressing: 4: Min-Patient  completed 75 plus % of tasks  FIM - Toileting Toileting steps completed by patient: Performs perineal  hygiene Toileting Assistive Devices: Grab bar or rail for support Toileting: 3: Mod-Patient completed 2 of 3 steps  FIM - Radio producer Devices: Grab bars Toilet Transfers: 4-To toilet/BSC: Min A (steadying Pt. > 75%), 4-From toilet/BSC: Min A (steadying Pt. > 75%)  FIM - Bed/Chair Transfer Bed/Chair Transfer Assistive Devices: Walker, Orthosis, Arm rests (R HO) Bed/Chair Transfer: 4: Bed > Chair or W/C: Min A (steadying Pt. > 75%), 4: Chair or W/C > Bed: Min A (steadying Pt. > 75%)  FIM - Locomotion: Wheelchair Distance: 150 Locomotion: Wheelchair: 0: Activity did not occur FIM - Locomotion: Ambulation Locomotion: Ambulation Assistive Devices: Administrator, Orthosis (hand orthosis) Ambulation/Gait Assistance: 3: Mod assist (10' with cues for posture, attention, safety, and technique) Locomotion: Ambulation: 1: Travels less than 50 ft with moderate assistance (Pt: 50 - 74%)  Comprehension Comprehension Mode: Auditory Comprehension: 5-Follows basic conversation/direction: With no assist  Expression Expression Mode: Verbal Expression: 4-Expresses basic 75 - 89% of the time/requires cueing 10 - 24% of the time. Needs helper to occlude trach/needs to repeat words.  Social Interaction Social Interaction: 5-Interacts appropriately 90% of the time - Needs monitoring or encouragement for participation or interaction.  Problem Solving Problem Solving: 2-Solves basic 25 - 49% of the time - needs direction more than half the time to initiate, plan or complete simple activities  Memory Memory: 2-Recognizes or recalls 25 - 49% of the time/requires cueing 51 - 75% of the time  Medical Problem List and Plan: 1. Functional deficits secondary to cardio-embolic, left MCA branch infarct with hemorrhagic transformation after TPA   2. DVT Prophylaxis/Anticoagulation: SCDs. Monitor for any signs of DVT 3. Pain Management: Tylenol as needed---minimal pain at  present 4. Mood/history of memory loss/mild dementia: Aricept 5 mg daily at bedtime  -exacerbated by cva/acute hospitalization 5. Neuropsych: This patient is not capable of making decisions on her own behalf. 6. Skin/Wound Care: Routine skin checks 7. Fluids/Electrolytes/Nutrition:   Encouraging po fluids.   -see below 8. Dysphagia:. Dysphagia 2 thin liquids. advance per speech therapy, poor fluid intake, monitor be met as well as I's and O's,  IV fluid at night 9. Hypertension/atrial fibrillation/pacemaker. Cardizem CD 120 mg daily,Multaq 400 mg twice a day, cardiac rate control 10.Bilat carotid Stenosis. VVS to see to help determine potential surgical needs moving forward 11. Hypothyroidism. Synthroid 12. Diabetes mellitus of peripheral neuropathy. Hemoglobin A1c 7.5. Sliding scale insulin.   -sugars generally acceptable at present, D/C CBGs and SSI 13. Chronic renal insufficiency/mild acute renal insufficiency. Baseline creatinine 1.36- improved to 1.18  -  D/C IVF and IV  -continue to encourage PO  -megace has helped appetite. Liquid intake still not optimal  -recheck BMET in am 14. CAD/NSTEMI/angioplasty. Continue aspirin. No chest pain or shortness of breath 15. Hyperlipidemia. Zetia, Crestor    LOS (Days) 13 A FACE TO FACE EVALUATION WAS PERFORMED  Alysia Penna E 05/26/2014 10:44 AM

## 2014-05-26 NOTE — Progress Notes (Signed)
Physical Therapy Discharge Summary  Patient Details  Name: [name] MRN: [id number] Date of Birth: 29-Jun-1918  Today's Date: 05/26/2014 PT Individual Time: 1030 - 1130  60 minutes   Patient has met 7 of 8 long term goals due to improved activity tolerance, improved balance and improved postural control.  Patient to discharge at an ambulatory level [place].   Patient's care partner trained staff at [facility] to provide the necessary physical assistance at discharge.  Reasons goals not met: Goals not met due to decreased short term memory as well as decreased safety awareness.   Recommendation:  Patient will benefit from ongoing skilled PT services in home health setting to continue to advance safe functional mobility, address ongoing impairments in balance and coordination, and minimize fall risk.  Equipment: No equipment provided  Reasons for discharge: lack of progress toward goals  Patient/family agrees with progress made and goals achieved: Yes  PT Discharge Precautions/Restrictions Precautions Precautions: Fall Precaution Comments: pacemaker Restrictions Weight Bearing Restrictions: No Vital Signs  Pain No c/o pain.   Vision/Perception  As per OT evaluation.  Cognition Overall Cognitive Status: Impaired/Different from baseline Arousal/Alertness: Awake/alert Memory: Impaired Memory Impairment: Decreased recall of new information Awareness: Impaired Problem Solving: Impaired Reasoning: Impaired Behaviors: Impulsive Safety/Judgment: Impaired Sensation Sensation Light Touch: Impaired Detail Light Touch Impaired Details: Impaired RUE Hot/Cold: Impaired by gross assessment;Impaired Detail Hot/Cold Impaired Details: Impaired RUE Coordination Gross Motor Movements are Fluid and Coordinated: No Fine Motor Movements are Fluid and Coordinated: No Finger Nose Finger Test: unable to complete with RUE Motor  Motor Motor: Hemiplegia;Abnormal postural  alignment and control  Mobility Bed Mobility Bed Mobility: Sit to Supine;Supine to Sit Supine to Sit: 5: Supervision;HOB flat (no rails) Sit to Supine: 5: Supervision;HOB flat (no rails) Transfers Transfers: Yes Sit to Stand: 5: Supervision Stand to Sit: 5: Supervision Stand Pivot Transfers: 4: Min guard Locomotion  Ambulation Ambulation: Yes Ambulation/Gait Assistance: 4: Min assist Ambulation Distance (Feet): 86 Feet Assistive device: 1 person hand held assist Gait Gait: Yes Stairs / Additional Locomotion Stairs: Yes Stairs Assistance: 4: Min assist Stair Management Technique: One rail Left;Alternating pattern Number of Stairs: 8 Height of Stairs: 6 Wheelchair Mobility Wheelchair Mobility: Yes Wheelchair Assistance: 5: Supervision;4: Energy manager: Left lower extremity;Both lower extermities Distance: 150  Trunk/Postural Assessment  Cervical Assessment Cervical Assessment: Exceptions to [facility] Thoracic Assessment Thoracic Assessment: Exceptions to [facility] Lumbar Assessment Lumbar Assessment: Exceptions to [facility] Postural Control Postural Control: Deficits on evaluation  Balance Balance Balance Assessed: Yes Standardized Balance Assessment Standardized Balance Assessment: Berg Balance Test Berg Balance Test Sit to Stand: Able to stand  independently using hands Standing Unsupported: Able to stand 30 seconds unsupported Sitting with Back Unsupported but Feet Supported on Floor or Stool: Able to sit safely and securely 2 minutes Stand to Sit: Sits safely with minimal use of hands Transfers: Able to transfer with verbal cueing and /or supervision Standing Unsupported with Eyes Closed: Able to stand 10 seconds with supervision Standing Ubsupported with Feet Together: Needs help to attain position but able to stand for 30 seconds with feet together From Standing, Reach Forward with Outstretched Arm: Reaches forward but needs supervision From Standing  Position, Pick up Object from Floor: Able to pick up shoe, needs supervision From Standing Position, Turn to Look Behind Over each Shoulder: Turn sideways only but maintains balance Turn 360 Degrees: Needs assistance while turning Standing Unsupported, Alternately Place Feet on Step/Stool: Able to complete >2 steps/needs minimal assist Standing Unsupported,  One Foot in Front: Needs help to step but can hold 15 seconds Standing on One Leg: Unable to try or needs assist to prevent fall Total Score: 27 Static Standing Balance Static Standing - Balance Support: During functional activity Static Standing - Level of Assistance: 5: Stand by assistance Dynamic Standing Balance Dynamic Standing - Balance Support: During functional activity Dynamic Standing - Level of Assistance: 4: Min assist Extremity Assessment B UEs as per OT evaluation.   RLE Assessment RLE Assessment: Within Functional Limits LLE Assessment LLE Assessment: Within Functional Limits  Skilled Therapy:  Pt was seen bedside in the am. Pt willing to participate with therapy. Pt transported to rehab gym. Pt performed car transfer x 3 with min A and verbal cues. Pt propelled w/c with B LEs and L UE about 150 feet with S to min A and verbal cues. Pt ascended/descended 8 stairs with L rail, step over step with min A and verbal cues. Pt ambulated 86 feet with hand hold min A with verbal cues. Pt performed Berg Balance test scored 27/56 which is a 10 point increased from when first tested. Pt returned to room and left sitting up in w/c with quick release belt in place and call bell within reach.   See FIM for current functional status  Dub Amis 05/26/2014, 4:00 PM

## 2014-05-27 ENCOUNTER — Encounter: Payer: Medicare Other | Admitting: Internal Medicine

## 2014-05-27 LAB — BASIC METABOLIC PANEL
ANION GAP: 8 (ref 5–15)
BUN: 17 mg/dL (ref 6–20)
CHLORIDE: 109 mmol/L (ref 101–111)
CO2: 21 mmol/L — AB (ref 22–32)
CREATININE: 1.3 mg/dL — AB (ref 0.44–1.00)
Calcium: 8.9 mg/dL (ref 8.9–10.3)
GFR calc Af Amer: 40 mL/min — ABNORMAL LOW (ref >60)
GFR calc non Af Amer: 34 mL/min — ABNORMAL LOW (ref >60)
Glucose, Bld: 122 mg/dL — ABNORMAL HIGH (ref 70–99)
POTASSIUM: 4.1 mmol/L (ref 3.5–5.1)
Sodium: 138 mmol/L (ref 135–145)

## 2014-05-27 LAB — GLUCOSE, CAPILLARY: Glucose-Capillary: 124 mg/dL — ABNORMAL HIGH (ref 70–99)

## 2014-05-27 NOTE — Progress Notes (Signed)
[facility]. Pts belongings gathered. Pt resting and awaiting transport to [facility].

## 2014-05-27 NOTE — Discharge Instructions (Signed)
Inpatient Rehab Discharge Instructions  Mickel Croworma Wiedemann Discharge date and time: No discharge date for patient encounter.   Activities/Precautions/ Functional Status: Activity: activity as tolerated Diet: dysphagia 2 thin liquids Wound Care: none needed Functional status:  ___ No restrictions     ___ Walk up steps independently _x__ 24/7 supervision/assistance   ___ Walk up steps with assistance ___ Intermittent supervision/assistance  ___ Bathe/dress independently ___ Walk with walker     ___ Bathe/dress with assistance ___ Walk Independently    ___ Shower independently _x STROKE/TIA DISCHARGE INSTRUCTIONS SMOKING Cigarette smoking nearly doubles your risk of having a stroke & is the single most alterable risk factor  If you smoke or have smoked in the last 12 months, you are advised to quit smoking for your health.  Most of the excess cardiovascular risk related to smoking disappears within a year of stopping.  Ask you doctor about anti-smoking medications  [place] Quit Line: 1-800-QUIT NOW  Free Smoking Cessation Classes [phone]  CHOLESTEROL Know your levels; limit fat & cholesterol in your diet  Lipid Panel     Component Value Date/Time   CHOL 137 05/09/2014 0333   TRIG 79 05/09/2014 0333   HDL 47 05/09/2014 0333   CHOLHDL 2.9 05/09/2014 0333   VLDL 16 05/09/2014 0333   LDLCALC 74 05/09/2014 0333      Many patients benefit from treatment even if their cholesterol is at goal.  Goal: Total Cholesterol (CHOL) less than 160  Goal:  Triglycerides (TRIG) less than 150  Goal:  HDL greater than 40  Goal:  LDL (LDLCALC) less than 100   BLOOD PRESSURE American Stroke Association blood pressure target is less that 120/80 mm/Hg  Your discharge blood pressure is:  BP: (!) 137/57 mmHg  Monitor your blood pressure  Limit your salt and alcohol intake  Many individuals will require more than one medication for high blood pressure  DIABETES (A1c is a blood sugar average for  last 3 months) Goal HGBA1c is under 7% (HBGA1c is blood sugar average for last 3 months)  Diabetes:    Lab Results  Component Value Date   HGBA1C 7.5* 05/09/2014     Your HGBA1c can be lowered with medications, healthy diet, and exercise.  Check your blood sugar as directed by your physician  Call your physician if you experience unexplained or low blood sugars.  PHYSICAL ACTIVITY/REHABILITATION Goal is 30 minutes at least 4 days per week  Activity: Increase activity slowly, Therapies: Physical Therapy: Home Health Return to work:   Activity decreases your risk of heart attack and stroke and makes your heart stronger.  It helps control your weight and blood pressure; helps you relax and can improve your mood.  Participate in a regular exercise program.  Talk with your doctor about the best form of exercise for you (dancing, walking, swimming, cycling).  DIET/WEIGHT Goal is to maintain a healthy weight  Your discharge diet is: DIET DYS 2 Room service appropriate?: Yes; Fluid consistency:: Thin; Fluid restriction:: Other (see comments)  liquids Your height is:    Your current weight is:   Your Body Mass Index (BMI) is:     Following the type of diet specifically designed for you will help prevent another stroke.  Your goal weight range is:    Your goal Body Mass Index (BMI) is 19-24.  Healthy food habits can help reduce 3 risk factors for stroke:  High cholesterol, hypertension, and excess weight.  RESOURCES Stroke/Support Group:  Call [phone]  STROKE EDUCATION PROVIDED/REVIEWED AND GIVEN TO PATIENT Stroke warning signs and symptoms How to activate emergency medical system (call 911). Medications prescribed at discharge. Need for follow-up after discharge. Personal risk factors for stroke. Pneumonia vaccine given:  Flu vaccine given:  My questions have been answered, the writing is legible, and I understand these instructions.  I will adhere to these goals &  educational materials that have been provided to me after my discharge from the hospital.    __ Walk with assistance    ___ Shower with assistance ___ No alcohol     ___ Return to work/school ________  Special Instructions:    My questions have been answered and I understand these instructions. I will adhere to these goals and the provided educational materials after my discharge from the hospital.  Patient/Caregiver Signature _______________________________ Date __________  Clinician Signature _______________________________________ Date __________  Please bring this form and your medication list with you to all your follow-up doctor's appointments.

## 2014-05-27 NOTE — Progress Notes (Signed)
[facility] PHYSICAL MEDICINE & REHABILITATION     PROGRESS NOTE    Subjective/Complaints: No new issues. Slept well. Uneventful weekend.  Review of systems is limited by a aphasia, she does deny shortness of breath or any pains  Objective: Vital Signs: Blood pressure 144/56, pulse 59, temperature 98 F (36.7 C), temperature source Oral, resp. rate 17, SpO2 98 %. No results found. No results for input(s): WBC, HGB, HCT, PLT in the last 72 hours.  Recent Labs  05/26/14 0645 05/27/14 0531  NA 136 138  K 3.9 4.1  CL 107 109  GLUCOSE 118* 122*  BUN 14 17  CREATININE 1.18* 1.30*  CALCIUM 9.1 8.9   CBG (last 3)   Recent Labs  05/25/14 1641 05/25/14 2044 05/26/14 0612  GLUCAP 140* 130* 107*    Wt Readings from Last 3 Encounters:  05/13/14 54.7 kg (120 lb 9.5 oz)  10/24/13 58.06 kg (128 lb)  10/16/13 58.423 kg (128 lb 12.8 oz)    Physical Exam:  Gen: pt pleasant, alert,  HENT: oral mucosa pink, dentition good Eyes: EOM are normal.  Neck: Normal range of motion. Neck supple. No thyromegaly present.  Cardiovascular:  Cardiac rate controlled without murmur Respiratory: Effort normal and breath sounds normal. No respiratory distress.no wheezes, rales  GI: Soft. Bowel sounds are normal. She exhibits no distension. No tenderness Neurological: She is alert and oriented to place and reason she's here. Right central 7 and mild tongue deviation. She is hard of hearing bilaterally but still is able to converse without additional effort or volume. She followed all simple commands. There wasa Occasionally delay in processing and language , but this appears much improved from prior notes in chart. RUE: 2- to 2/5 deltoid, biceps, triceps, trace to 0/5 wrist and hand. RLE: 4- hf, ke, and adfl/apf. Sensation appeared to be grossly intact to light touch and pain in right upper, lower, and face.   Right tone: DTR's 3+, toes up. foot is hypersentitive toutouch. 5 minus/5 in the left  deltoid, biceps, triceps, grip, hip flexor, knee extensor, ankle dorsi flexors and plantar flexion Psych: pleasant, confused,     Assessment/Plan: 1. 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 Physiatrist is providing close team supervision and 24 hour management of active medical problems listed below. Physiatrist and rehab team continue to assess barriers to discharge/monitor patient progress toward functional and medical goals.   Ready for D/C today to [facility] Follow up with me in about 4-6 weeks    FIM: FIM - Bathing Bathing Steps Patient Completed: Chest, Right Arm, Abdomen, Front perineal area, Buttocks, Left lower leg (including foot), Right lower leg (including foot), Right upper leg, Left upper leg Bathing: 4: Min-Patient completes 8-9 19f10 parts or 75+ percent  FIM - Upper Body Dressing/Undressing Upper body dressing/undressing steps patient completed: Thread/unthread right bra strap, Thread/unthread left bra strap, Thread/unthread left sleeve of pullover shirt/dress, Put head through opening of pull over shirt/dress, Thread/unthread right sleeve of pullover shirt/dresss, Pull shirt over trunk Upper body dressing/undressing: 4: Min-Patient completed 75 plus % of tasks FIM - Lower Body Dressing/Undressing Lower body dressing/undressing steps patient completed: Thread/unthread right underwear leg, Thread/unthread right pants leg, Don/Doff left shoe, Thread/unthread left underwear leg, Thread/unthread left pants leg, Don/Doff right shoe Lower body dressing/undressing: 4: Min-Patient completed 75 plus % of tasks  FIM - Toileting Toileting steps completed by patient: Adjust  clothing prior to toileting, Performs perineal hygiene, Adjust clothing after toileting Toileting Assistive Devices: Grab bar or rail for support Toileting: 3:  Mod-Patient completed 2 of 3 steps  FIM - Radio producer Devices: Grab bars, Insurance account manager Transfers: 5-To toilet/BSC: Supervision (verbal cues/safety issues)  FIM - Control and instrumentation engineer Devices: Copy: 5: Supine > Sit: Supervision (verbal cues/safety issues), 4: Bed > Chair or W/C: Min A (steadying Pt. > 75%)  FIM - Locomotion: Wheelchair Distance: 150 Locomotion: Wheelchair: 4: Travels 150 ft or more: maneuvers on rugs and over door sillls with minimal assistance (Pt.>75%) FIM - Locomotion: Ambulation Locomotion: Ambulation Assistive Devices: Other (comment) (hand hold assist) Ambulation/Gait Assistance: 4: Min assist Locomotion: Ambulation: 2: Travels 50 - 149 ft with minimal assistance (Pt.>75%)  Comprehension Comprehension Mode: Auditory Comprehension: 5-Follows basic conversation/direction: With no assist  Expression Expression Mode: Verbal Expression: 4-Expresses basic 75 - 89% of the time/requires cueing 10 - 24% of the time. Needs helper to occlude trach/needs to repeat words.  Social Interaction Social Interaction: 5-Interacts appropriately 90% of the time - Needs monitoring or encouragement for participation or interaction.  Problem Solving Problem Solving: 2-Solves basic 25 - 49% of the time - needs direction more than half the time to initiate, plan or complete simple activities  Memory Memory: 2-Recognizes or recalls 25 - 49% of the time/requires cueing 51 - 75% of the time  Medical Problem List and Plan: 1. Functional deficits secondary to cardio-embolic, left MCA branch infarct with hemorrhagic transformation after TPA   2. DVT Prophylaxis/Anticoagulation: SCDs. Monitor for any signs of DVT 3. Pain Management: Tylenol as needed---minimal pain at present 4. Mood/history of memory loss/mild dementia: Aricept 5 mg daily at bedtime  -exacerbated by cva/acute hospitalization 5.  Neuropsych: This patient is not capable of making decisions on her own behalf. 6. Skin/Wound Care: Routine skin checks 7. Fluids/Electrolytes/Nutrition:   Encouraging po fluids.   -see below 8. Dysphagia:. Dysphagia 2 thin liquids. advance per speech therapy, poor fluid intake, monitor be met as well as I's and O's,  IV fluid at night 9. Hypertension/atrial fibrillation/pacemaker. Cardizem CD 120 mg daily,Multaq 400 mg twice a day, cardiac rate control 10.Bilat carotid Stenosis. VVS to see to help determine potential surgical needs moving forward 11. Hypothyroidism. Synthroid 12. Diabetes mellitus of peripheral neuropathy. Hemoglobin A1c 7.5. Sliding scale insulin.   -sugars generally acceptable at present  13. Chronic renal insufficiency/mild acute renal insufficiency. Baseline creatinine 1.36----1.30 today  -off IVF---will need ongoing lab follow up. May still need prn IV[facility] at [facility] due to poor awareness of fluid needs/intake  -continue to encourage PO  -megace has helped appetite. Liquid intake still not optimal  14. CAD/NSTEMI/angioplasty. Continue aspirin. No chest pain or shortness of breath 15. Hyperlipidemia. Zetia, Crestor    LOS (Days) 14 A FACE TO FACE EVALUATION WAS PERFORMED  Pratyush Ammon T 05/27/2014 7:37 AM

## 2014-05-27 NOTE — Progress Notes (Signed)
Speech Language Pathology Discharge Summary  Patient Details  Name: [name] MRN: [id number] Date of Birth: 01/17/1919   Patient has met 5 of 7 long term goals.  Patient to discharge at overall Mod level.   Reasons goals not met: Patient requires Mod A for intellectual awareness of deficits and was unable to upgrade textures due to poor recalla and utilization of compensatory strategies    Clinical Impression/Discharge Summary: Patient has made functional gains and has met 5 of 7 LTG's this admission due to increased speech intelligibility and word finding, recall, functional problem solving with familiar tasks and utilization of swallowing compensatory strategies. Currently, patient is consuming Dys. 2 textures with thin liquids with minimal overt s/s of aspiration but continues to require Min-Mod A verbal cues for use of small bites/sips, a slow rate of self-feeding and to manage right buccal pocketing and anterior spillage. Patient also requires supervision multimodal cues for use of word-finding and speech intelligibility strategies and is 100% intelligible at the sentence level.  Patient currently requires overall Mod A verbal and visual cues for recall of information, functional problem solving with basic and familiar tasks, intellectual awareness of deficits and overall safety. Patient's family is unable to provide the necessary assistance needed at this time, therefore, patient will discharge to a SNF. Patient would benefit from f/u skilled SLP services to maximize her cognitive-linguistic, speech and swallowing function and overall functional independence to reduce caregiver burden.    Care Partner:  Caregiver Able to Provide Assistance: No  Type of Caregiver Assistance: Physical;Cognitive  Recommendation:  24 hour supervision/assistance;Skilled Nursing facility  Rationale for SLP Follow Up: Maximize cognitive function and independence;Maximize functional communication;Reduce  caregiver burden;Maximize swallowing safety   Equipment: N/A  Reasons for discharge: Discharged from hospital   Patient/Family Agrees with Progress Made and Goals Achieved: Yes   See FIM for current functional status  Jai Steil 05/27/2014, 4:27 PM

## 2014-05-28 ENCOUNTER — Encounter: Payer: Self-pay | Admitting: Internal Medicine

## 2014-05-28 ENCOUNTER — Non-Acute Institutional Stay (SKILLED_NURSING_FACILITY): Payer: Medicare Other | Admitting: Internal Medicine

## 2014-05-28 DIAGNOSIS — I63512 Cerebral infarction due to unspecified occlusion or stenosis of left middle cerebral artery: Secondary | ICD-10-CM

## 2014-05-28 DIAGNOSIS — I10 Essential (primary) hypertension: Secondary | ICD-10-CM

## 2014-05-28 DIAGNOSIS — R489 Unspecified symbolic dysfunctions: Secondary | ICD-10-CM | POA: Diagnosis not present

## 2014-05-28 DIAGNOSIS — I69922 Dysarthria following unspecified cerebrovascular disease: Secondary | ICD-10-CM

## 2014-05-28 DIAGNOSIS — E039 Hypothyroidism, unspecified: Secondary | ICD-10-CM

## 2014-05-28 DIAGNOSIS — I6523 Occlusion and stenosis of bilateral carotid arteries: Secondary | ICD-10-CM | POA: Diagnosis not present

## 2014-05-28 DIAGNOSIS — I639 Cerebral infarction, unspecified: Secondary | ICD-10-CM | POA: Diagnosis not present

## 2014-05-28 DIAGNOSIS — F0150 Vascular dementia without behavioral disturbance: Secondary | ICD-10-CM

## 2014-05-28 DIAGNOSIS — I2510 Atherosclerotic heart disease of native coronary artery without angina pectoris: Secondary | ICD-10-CM | POA: Diagnosis not present

## 2014-05-28 DIAGNOSIS — I679 Cerebrovascular disease, unspecified: Secondary | ICD-10-CM | POA: Diagnosis not present

## 2014-05-28 DIAGNOSIS — I69921 Dysphasia following unspecified cerebrovascular disease: Secondary | ICD-10-CM | POA: Diagnosis not present

## 2014-05-28 DIAGNOSIS — I69391 Dysphagia following cerebral infarction: Secondary | ICD-10-CM

## 2014-05-28 DIAGNOSIS — I69351 Hemiplegia and hemiparesis following cerebral infarction affecting right dominant side: Secondary | ICD-10-CM | POA: Diagnosis not present

## 2014-05-28 DIAGNOSIS — I480 Paroxysmal atrial fibrillation: Secondary | ICD-10-CM

## 2014-05-28 DIAGNOSIS — Z950 Presence of cardiac pacemaker: Secondary | ICD-10-CM | POA: Diagnosis not present

## 2014-05-28 DIAGNOSIS — M6281 Muscle weakness (generalized): Secondary | ICD-10-CM | POA: Diagnosis not present

## 2014-05-28 DIAGNOSIS — R1312 Dysphagia, oropharyngeal phase: Secondary | ICD-10-CM | POA: Diagnosis not present

## 2014-05-28 DIAGNOSIS — G8191 Hemiplegia, unspecified affecting right dominant side: Secondary | ICD-10-CM

## 2014-05-28 DIAGNOSIS — G8190 Hemiplegia, unspecified affecting unspecified side: Secondary | ICD-10-CM | POA: Diagnosis not present

## 2014-05-28 DIAGNOSIS — I69320 Aphasia following cerebral infarction: Secondary | ICD-10-CM | POA: Diagnosis not present

## 2014-05-28 DIAGNOSIS — IMO0002 Reserved for concepts with insufficient information to code with codable children: Secondary | ICD-10-CM

## 2014-05-28 DIAGNOSIS — I69393 Ataxia following cerebral infarction: Secondary | ICD-10-CM | POA: Diagnosis not present

## 2014-05-28 DIAGNOSIS — I6012 Nontraumatic subarachnoid hemorrhage from left middle cerebral artery: Secondary | ICD-10-CM | POA: Diagnosis not present

## 2014-05-28 DIAGNOSIS — I69822 Dysarthria following other cerebrovascular disease: Secondary | ICD-10-CM | POA: Diagnosis not present

## 2014-05-28 DIAGNOSIS — R1310 Dysphagia, unspecified: Secondary | ICD-10-CM | POA: Diagnosis not present

## 2014-05-28 NOTE — Progress Notes (Addendum)
Social Work  Discharge Note  The overall goal for the admission was met for:   Discharge location: Yes - returned to [facility] at the SNF LOC  Length of Stay: Yes - 14 days  Discharge activity level: Yes - minimal assistance  Home/community participation: Yes  Services provided included: MD, RD, PT, OT, SLP, RN, TR, Pharmacy and SW  Financial Services: Medicare and Private Insurance: [employer]  Follow-up services arranged: Other: SNF at [facility] (or additional information):  Patient/Family verbalized understanding of follow-up arrangements: Yes  Individual responsible for coordination of the follow-up plan: son  Confirmed correct DME delivered: NA    [name]

## 2014-05-28 NOTE — Progress Notes (Signed)
Patient ID: [name], female   DOB: [birth year], 81 y.o.   MRN: [id number]  Provider:  Gwenith Spitz. Renato Gails, D.O., C.M.D. Location:  [facility]  PCP: Ginette Otto, MD  Code Status: full code Goals of Care:  Advanced Directive information Does patient have an advance directive?: Yes, Type of Advance Directive: Healthcare Power of [place];Living will, Does patient want to make changes to advanced directive?: No - Patient declined   Allergies  Allergen Reactions  . Other Other (See Comments)    Allergic to crab meat, but no shellfish allergy (can eat lobster, shrimp, etc.)  Crab meat causes angioedema of the throat.  Marland Kitchen Penicillins   . Sulfa Antibiotics     Chief Complaint  Patient presents with  . New Admit To SNF    s/p hospitalization for acute left mca cardioembolic stroke (slurred speech, facial droop, right-sided weakness, hypertension, dysphagia), had TPA, found to have bilateral carotid stenosis also, went to [facility], now here for more therapy    HPI: 81 y.o. female with h/o afib (previoiusly on coumadin but changed to asa  due to GI  Bleed), hyperlipidemia, hypothyroidism,htn, CTS, DMII with CKDIII, lumbar spinal stenosis, osteopenia, prior NSTEMI s/p bare metal stent to the RCA was seen upon admission to [facility] from independent living.  She is followed by Dr. Merlene Laughter as outpatient.  She was found in her home to be down on the floor on her knees with slurred speech in her apt by the nurse here.  She had facial droop, right sided weakness and htn to 180/98.  She was confused.  She refused transport to the ED until her son was contacted and agreed.  She did receive tpa after her CT was negative.  She was then found to have a cardioembolic left MCA infarct.  She also had bilateral carotid stenosis and is to f/u with Dr. Arbie Cookey about this.  She underwent inpatient therapy and was walking with her walker quite well with use of an orthotic due to the  right arm weakness being worse than the right leg.  She also was followed by speech therapy and was sent her with mechanical soft diet with ground meats and thin liquids, swallowing meds whole in applesauce.  She ws noted to be impulse.  She has expressive aphasia, but this already seems much improved.  Her spirits are good.  She is motivated to get her strength back and go back home, but she was living alone prior to this stroke.  ROS: Review of Systems  Constitutional: Negative for fever and chills.  HENT: Negative for congestion.   Eyes: Negative for blurred vision.  Respiratory: Negative for shortness of breath.   Cardiovascular: Negative for chest pain, palpitations and leg swelling.  Gastrointestinal: Negative for abdominal pain, constipation, blood in stool and melena.  Genitourinary: Negative for dysuria.  Musculoskeletal: Negative for myalgias and falls.  Skin: Negative for rash.  Neurological: Positive for sensory change, speech change and focal weakness. Negative for dizziness and loss of consciousness.       Right arm weakness, mild right leg weakness, facial droop, dysarthria, mild aphasia  Psychiatric/Behavioral: Positive for memory loss.     Past Medical History  Diagnosis Date  . Pure hypercholesterolemia   . Hyperlipidemia     Tolerating medication well as of 11/27/12  . Hypothyroidism   . Carpal tunnel syndrome   . Benign hypertensive heart disease without heart failure   . Coronary atherosclerosis of native coronary artery   .  Spinal stenosis of lumbar region   . Osteopenia   . Postsurgical percutaneous transluminal coronary angioplasty status   . Essential hypertension, benign   . Benign hypertensive kidney disease with chronic kidney disease stage I through stage IV, or unspecified   . Diabetes mellitus with chronic kidney disease   . NSTEMI (non-ST elevated myocardial infarction) 09/21/09    BM stent RCA  . Atrial fibrillation   . Tachy-brady syndrome     s/p  PPM, battery change 08/01/09  . Sinoatrial node dysfunction   . Chronic anemia   . Osteoarthritis   . H/O echocardiogram 07/19/09    EF = 60-65%  . Hyperglycemia     Mild  . Microalbuminuria 02/2010  . Vitamin D deficiency 10/2011  . Chronic renal disease, stage III    Past Surgical History  Procedure Laterality Date  . Carpal tunnel release Right 02/07/09  . Vesicovaginal fistula closure w/ tah    . Oophorectomy      With hysterectomy. One ovary removed-fibroids.  . Pacemaker insertion  2004    Gen change 08/04/09 by Dr. Amil AmenEdmunds. New pacing generator is a Medtronic EnRhythm, model D1939726#[id number], serial [id number]  . Pacemaker generator change  08/04/09    By Dr. Amil AmenEdmunds. New pacing generator is a Medtronic EnRhythm, model D1939726#[id number], serial [id number] H  . Percutaneous coronary stent intervention (pci-s)  05/08/02    (3 tandem Cypher stent), proximal and mid LAD  . Coronary angioplasty with stent placement  09/21/09    By Dr. Amil AmenEdmunds. ACS, NSTEMI - BM stent RCA   Social History:   reports that she has never smoked. She does not have any smokeless tobacco history on file. She reports that she drinks alcohol. She reports that she does not use illicit drugs.  Family History  Problem Relation Age of Onset  . Heart disease Mother   . Heart disease Father   . Diabetes Father   . Peripheral vascular disease Brother   . Cerebral palsy Son     Medications: Patient's Medications  New Prescriptions   No medications on file  Previous Medications   ASPIRIN 325 MG TABLET    Take 325 mg by mouth daily.   CALCIUM CARBONATE-VITAMIN D (CALTRATE 600+D PO)    Take 1 tablet by mouth daily.   CARTIA XT 120 MG 24 HR CAPSULE    Take 120 mg by mouth.    DONEPEZIL (ARICEPT) 5 MG TABLET    Take 5 mg by mouth at bedtime.   LEVOTHYROXINE (SYNTHROID) 50 MCG TABLET    Take 25 mcg by mouth daily before breakfast.   MULTAQ 400 MG TABLET    TAKE 1 TABLET BY MOUTH TWICE DAILY   NITROGLYCERIN (NITROSTAT) 0.4 MG SL  TABLET    Place 0.4 mg under the tongue every 5 (five) minutes as needed for chest pain (MAX 3 TABLETS).    ROSUVASTATIN (CRESTOR) 5 MG TABLET    Take 5 mg by mouth at bedtime.    ZETIA 10 MG TABLET    TAKE 1 TABLET BY MOUTH ONCE DAILY  Modified Medications   No medications on file  Discontinued Medications   No medications on file     Physical Exam: Filed Vitals:   05/28/14 1042  BP: 174/72  Pulse: 68  Temp: 97.7 F (36.5 C)  Resp: 20  Height: 4' 8.5" (1.435 m)  Weight: 129 lb 6.4 oz (58.695 kg)  SpO2: 96%   Physical Exam  Constitutional: She appears well-developed and well-nourished. No  distress.  HENT:  Head: Normocephalic and atraumatic.  Right Ear: External ear normal.  Left Ear: External ear normal.  Nose: Nose normal.  Mouth/Throat: Oropharynx is clear and moist.  Eyes: Conjunctivae are normal. Pupils are equal, round, and reactive to light.  Neck: Neck supple. No JVD present.  Cardiovascular:  irreg irreg  Pulmonary/Chest: Effort normal and breath sounds normal. She has no rales.  Abdominal: Soft. Bowel sounds are normal.  Musculoskeletal: She exhibits no edema or tenderness.  Is able to move right arm and abduct and flex at shoulder, but has 0 handgrip on right; 4+/5 right leg  Neurological: She is alert. A cranial nerve deficit is present.  Oriented to person and place, but not precise time (MMSE just done today); facial droop, dysarthria, mild aphasia (word finding did quite well during visit today)  Skin: Skin is warm and dry.  Psychiatric: She has a normal mood and affect.     Labs reviewed: Basic Metabolic Panel:  Recent Labs  69/62/95 0703 05/26/14 0645 05/27/14 0531  NA 138 136 138  K 3.9 3.9 4.1  CL 107 107 109  CO2 22 18* 21*  GLUCOSE 103* 118* 122*  BUN CREATININE 1.22* 1.18* 1.30*  CALCIUM 8.9 9.1 8.9   Liver Function Tests:  Recent Labs  05/08/14 1652 05/14/14 1420  AST 38* 23  ALT 29 20  ALKPHOS 63 64  BILITOT 0.6  0.4  PROT 7.1 7.0  ALBUMIN 3.6 3.1*   No results for input(s): LIPASE, AMYLASE in the last 8760 hours. No results for input(s): AMMONIA in the last 8760 hours. CBC:  Recent Labs  05/08/14 1652  05/13/14 0235 05/14/14 1420 05/17/14 0733  WBC 11.6*  < > 12.0* 10.6* 8.2  NEUTROABS 7.6  --  8.7* 7.5  --   HGB 14.9  < > 14.1 14.6 14.4  HCT 44.1  < > 42.1 43.2 43.2  MCV 91.5  < > 91.1 90.8 90.4  PLT 221  < > 229 273 293  < > = values in this interval not displayed. Cardiac Enzymes: No results for input(s): CKTOTAL, CKMB, CKMBINDEX, TROPONINI in the last 8760 hours. BNP: Invalid input(s): POCBNP CBG:  Recent Labs  05/25/14 2044 05/26/14 0612 05/27/14 1150  GLUCAP 130* 107* 124*    Imaging and Procedures: 05/08/14:  CT head w/o contrast:  1. Stable atrophy and white matter disease. 2. No acute intracranial abnormality. 3. Atherosclerosis.  05/09/14:  CT head w/o contrast:  Multiple new parenchymal and subarachnoid bleeds since yesterday scan.  05/10/14: CTA neck w/ and w/o:  1. Cytotoxic edema along the left peri-Rolandic cortex has developed compatible with MCA territory infarct. No mass effect. 2. Stable intra-axial and trace subarachnoid hemorrhage as seen on 05/09/2014. Anterior superior left frontal lobe intra-axial blood volume of 7 mL. 3. Progressed bilateral ICA origin stenosis since 2008 now with bilateral ICA RADIOGRAPHIC STRING SIGNS. 4. Patent left MCA M1 segment and bifurcation with no major branch occlusion, but irregular appearing posterior most sylvian division left MCA branch with attenuated enhancement. 5. Bilateral vertebral artery atherosclerosis with up to 50% vertebral artery stenosis on the right. The left vertebral artery origin is not well visualized, and there is distal left vertebral (V4 segment) stenosis with diminutive and irregular appearance of the vessel to the vertebrobasilar junction. The left PICA remains patent. 6. Mild dependent opacity in  the right lung could reflect atelectasis, but developing right lung infection would be difficult to exclude.  05/12/14:  CXR:  Bibasilar opacity with differential considerations discussed above. No upper lobe opacification is identified.  05/15/14:  CT head w/o contrast:  1. Stable intracranial hemorrhage without progression or new hemorrhage. Slightly increased conspicuity of the left peri-Rolandic cortex infarct. 2. No hydrocephalus, midline shift, or new significant findings. 3. Periventricular white matter and corona radiata hypodensities favor chronic ischemic microvascular white matter disease. 4. Mild chronic ethmoid sinusitis.  Assessment/Plan 1. Left middle cerebral artery stroke -with right hemiparesis, dysarthria, dysphagia, aphasia  -here for continued PT, OT, ST -she is motivated -see hpi -cont secondary prevention:  Asa 325mg , bp and lipid control  2. Essential hypertension, benign -bp ok to be permissively elevated as long as under 180 initially after stroke, but should then be less than 130 after first couple of weeks  3. CAD in native artery -cont asa, crestor, zetia, cartia XT  4. Paroxysmal atrial fibrillation -cont asa 325mg  -had GI bleed on coumadin so that's why she was only placed on this  5. Hypothyroidism, unspecified hypothyroidism type -cont synthroid 50mcg daily and monitor  6. Right hemiparesis -here for PT, OT for this -able to ambulate with walker with orthotic for weak right arm -right leg is quite strong so I think she should do well--biggest challenge will be strengthening her right arm and using the left nondominant hand in the meantime  7. Cardiac pacemaker in situ -noted, follows with cardiology, Dr. Johney FrameAllred  8. Dysarthria due to cerebrovascular accident -receiving ST here, improving  9. Aphasia due to stroke -seems like this portion is much better, cont ST  10. Dysphagia due to recent cerebrovascular accident -cont modified diet and  ST to work on [employer]grading diet  11. Bilateral carotid stenosis -had us during stroke workup -to follow up with vascular, Dr. Arbie CookeyEarly as outpatient  12.  Vascular dementia of acute onset:  Scored 15/30 on mmse and failed clock drawing in rehab -will consider starting namenda XR for her before she returns home so she can be monitored for side effects  Keep other f/us:  Dr. Hermelinda MedicusSchwartz and Dr. Roda ShuttersXu, as well  Functional status:  Requiring assistance with bathing, dressing, grooming, and transfers due to her right arm weakness at present, but had been independent previously  Family/ staff Communication: discussed with rehab nurse and nurse manager  Labs/tests ordered:  Aisling Emigh L. Kyngston Pickelsimer, D.O. Geriatrics [facility] [address] [place], [place] [address] Cell Phone (Mon-Fri 8am-5pm):  [phone] On Call:  [phone] & follow prompts after 5pm & weekends Office Phone:  [phone] Office Fax:  [phone]

## 2014-05-29 DIAGNOSIS — R1312 Dysphagia, oropharyngeal phase: Secondary | ICD-10-CM | POA: Diagnosis not present

## 2014-05-29 DIAGNOSIS — I69822 Dysarthria following other cerebrovascular disease: Secondary | ICD-10-CM | POA: Diagnosis not present

## 2014-05-29 DIAGNOSIS — R489 Unspecified symbolic dysfunctions: Secondary | ICD-10-CM | POA: Diagnosis not present

## 2014-05-29 DIAGNOSIS — M6281 Muscle weakness (generalized): Secondary | ICD-10-CM | POA: Diagnosis not present

## 2014-05-29 DIAGNOSIS — G8190 Hemiplegia, unspecified affecting unspecified side: Secondary | ICD-10-CM | POA: Diagnosis not present

## 2014-05-29 DIAGNOSIS — I6012 Nontraumatic subarachnoid hemorrhage from left middle cerebral artery: Secondary | ICD-10-CM | POA: Diagnosis not present

## 2014-05-30 DIAGNOSIS — G8190 Hemiplegia, unspecified affecting unspecified side: Secondary | ICD-10-CM | POA: Diagnosis not present

## 2014-05-30 DIAGNOSIS — M6281 Muscle weakness (generalized): Secondary | ICD-10-CM | POA: Diagnosis not present

## 2014-05-30 DIAGNOSIS — I69822 Dysarthria following other cerebrovascular disease: Secondary | ICD-10-CM | POA: Diagnosis not present

## 2014-05-30 DIAGNOSIS — R1312 Dysphagia, oropharyngeal phase: Secondary | ICD-10-CM | POA: Diagnosis not present

## 2014-05-30 DIAGNOSIS — R489 Unspecified symbolic dysfunctions: Secondary | ICD-10-CM | POA: Diagnosis not present

## 2014-05-30 DIAGNOSIS — I6012 Nontraumatic subarachnoid hemorrhage from left middle cerebral artery: Secondary | ICD-10-CM | POA: Diagnosis not present

## 2014-05-31 DIAGNOSIS — I69822 Dysarthria following other cerebrovascular disease: Secondary | ICD-10-CM | POA: Diagnosis not present

## 2014-05-31 DIAGNOSIS — G8190 Hemiplegia, unspecified affecting unspecified side: Secondary | ICD-10-CM | POA: Diagnosis not present

## 2014-05-31 DIAGNOSIS — R489 Unspecified symbolic dysfunctions: Secondary | ICD-10-CM | POA: Diagnosis not present

## 2014-05-31 DIAGNOSIS — R1312 Dysphagia, oropharyngeal phase: Secondary | ICD-10-CM | POA: Diagnosis not present

## 2014-05-31 DIAGNOSIS — M6281 Muscle weakness (generalized): Secondary | ICD-10-CM | POA: Diagnosis not present

## 2014-05-31 DIAGNOSIS — I6012 Nontraumatic subarachnoid hemorrhage from left middle cerebral artery: Secondary | ICD-10-CM | POA: Diagnosis not present

## 2014-06-01 DIAGNOSIS — R1312 Dysphagia, oropharyngeal phase: Secondary | ICD-10-CM | POA: Diagnosis not present

## 2014-06-01 DIAGNOSIS — G8190 Hemiplegia, unspecified affecting unspecified side: Secondary | ICD-10-CM | POA: Diagnosis not present

## 2014-06-01 DIAGNOSIS — I69822 Dysarthria following other cerebrovascular disease: Secondary | ICD-10-CM | POA: Diagnosis not present

## 2014-06-01 DIAGNOSIS — M6281 Muscle weakness (generalized): Secondary | ICD-10-CM | POA: Diagnosis not present

## 2014-06-01 DIAGNOSIS — R489 Unspecified symbolic dysfunctions: Secondary | ICD-10-CM | POA: Diagnosis not present

## 2014-06-01 DIAGNOSIS — I6012 Nontraumatic subarachnoid hemorrhage from left middle cerebral artery: Secondary | ICD-10-CM | POA: Diagnosis not present

## 2014-06-03 DIAGNOSIS — M6281 Muscle weakness (generalized): Secondary | ICD-10-CM | POA: Diagnosis not present

## 2014-06-03 DIAGNOSIS — G8190 Hemiplegia, unspecified affecting unspecified side: Secondary | ICD-10-CM | POA: Diagnosis not present

## 2014-06-03 DIAGNOSIS — I6012 Nontraumatic subarachnoid hemorrhage from left middle cerebral artery: Secondary | ICD-10-CM | POA: Diagnosis not present

## 2014-06-03 DIAGNOSIS — I69822 Dysarthria following other cerebrovascular disease: Secondary | ICD-10-CM | POA: Diagnosis not present

## 2014-06-03 DIAGNOSIS — R1312 Dysphagia, oropharyngeal phase: Secondary | ICD-10-CM | POA: Diagnosis not present

## 2014-06-03 DIAGNOSIS — R489 Unspecified symbolic dysfunctions: Secondary | ICD-10-CM | POA: Diagnosis not present

## 2014-06-04 DIAGNOSIS — M6281 Muscle weakness (generalized): Secondary | ICD-10-CM | POA: Diagnosis not present

## 2014-06-04 DIAGNOSIS — R1312 Dysphagia, oropharyngeal phase: Secondary | ICD-10-CM | POA: Diagnosis not present

## 2014-06-04 DIAGNOSIS — R489 Unspecified symbolic dysfunctions: Secondary | ICD-10-CM | POA: Diagnosis not present

## 2014-06-04 DIAGNOSIS — I69822 Dysarthria following other cerebrovascular disease: Secondary | ICD-10-CM | POA: Diagnosis not present

## 2014-06-04 DIAGNOSIS — I6012 Nontraumatic subarachnoid hemorrhage from left middle cerebral artery: Secondary | ICD-10-CM | POA: Diagnosis not present

## 2014-06-04 DIAGNOSIS — G8190 Hemiplegia, unspecified affecting unspecified side: Secondary | ICD-10-CM | POA: Diagnosis not present

## 2014-06-05 DIAGNOSIS — I69822 Dysarthria following other cerebrovascular disease: Secondary | ICD-10-CM | POA: Diagnosis not present

## 2014-06-05 DIAGNOSIS — R489 Unspecified symbolic dysfunctions: Secondary | ICD-10-CM | POA: Diagnosis not present

## 2014-06-05 DIAGNOSIS — M6281 Muscle weakness (generalized): Secondary | ICD-10-CM | POA: Diagnosis not present

## 2014-06-05 DIAGNOSIS — I6012 Nontraumatic subarachnoid hemorrhage from left middle cerebral artery: Secondary | ICD-10-CM | POA: Diagnosis not present

## 2014-06-05 DIAGNOSIS — G8190 Hemiplegia, unspecified affecting unspecified side: Secondary | ICD-10-CM | POA: Diagnosis not present

## 2014-06-05 DIAGNOSIS — R1312 Dysphagia, oropharyngeal phase: Secondary | ICD-10-CM | POA: Diagnosis not present

## 2014-06-06 DIAGNOSIS — I6012 Nontraumatic subarachnoid hemorrhage from left middle cerebral artery: Secondary | ICD-10-CM | POA: Diagnosis not present

## 2014-06-06 DIAGNOSIS — M6281 Muscle weakness (generalized): Secondary | ICD-10-CM | POA: Diagnosis not present

## 2014-06-06 DIAGNOSIS — G8190 Hemiplegia, unspecified affecting unspecified side: Secondary | ICD-10-CM | POA: Diagnosis not present

## 2014-06-06 DIAGNOSIS — R1312 Dysphagia, oropharyngeal phase: Secondary | ICD-10-CM | POA: Diagnosis not present

## 2014-06-06 DIAGNOSIS — I69822 Dysarthria following other cerebrovascular disease: Secondary | ICD-10-CM | POA: Diagnosis not present

## 2014-06-06 DIAGNOSIS — R489 Unspecified symbolic dysfunctions: Secondary | ICD-10-CM | POA: Diagnosis not present

## 2014-06-07 DIAGNOSIS — I6012 Nontraumatic subarachnoid hemorrhage from left middle cerebral artery: Secondary | ICD-10-CM | POA: Diagnosis not present

## 2014-06-07 DIAGNOSIS — R489 Unspecified symbolic dysfunctions: Secondary | ICD-10-CM | POA: Diagnosis not present

## 2014-06-07 DIAGNOSIS — G8190 Hemiplegia, unspecified affecting unspecified side: Secondary | ICD-10-CM | POA: Diagnosis not present

## 2014-06-07 DIAGNOSIS — R1312 Dysphagia, oropharyngeal phase: Secondary | ICD-10-CM | POA: Diagnosis not present

## 2014-06-07 DIAGNOSIS — I69822 Dysarthria following other cerebrovascular disease: Secondary | ICD-10-CM | POA: Diagnosis not present

## 2014-06-07 DIAGNOSIS — M6281 Muscle weakness (generalized): Secondary | ICD-10-CM | POA: Diagnosis not present

## 2014-06-08 DIAGNOSIS — R489 Unspecified symbolic dysfunctions: Secondary | ICD-10-CM | POA: Diagnosis not present

## 2014-06-08 DIAGNOSIS — R1312 Dysphagia, oropharyngeal phase: Secondary | ICD-10-CM | POA: Diagnosis not present

## 2014-06-08 DIAGNOSIS — M6281 Muscle weakness (generalized): Secondary | ICD-10-CM | POA: Diagnosis not present

## 2014-06-08 DIAGNOSIS — I69822 Dysarthria following other cerebrovascular disease: Secondary | ICD-10-CM | POA: Diagnosis not present

## 2014-06-08 DIAGNOSIS — I6012 Nontraumatic subarachnoid hemorrhage from left middle cerebral artery: Secondary | ICD-10-CM | POA: Diagnosis not present

## 2014-06-08 DIAGNOSIS — G8190 Hemiplegia, unspecified affecting unspecified side: Secondary | ICD-10-CM | POA: Diagnosis not present

## 2014-06-10 DIAGNOSIS — I69822 Dysarthria following other cerebrovascular disease: Secondary | ICD-10-CM | POA: Diagnosis not present

## 2014-06-10 DIAGNOSIS — M6281 Muscle weakness (generalized): Secondary | ICD-10-CM | POA: Diagnosis not present

## 2014-06-10 DIAGNOSIS — R489 Unspecified symbolic dysfunctions: Secondary | ICD-10-CM | POA: Diagnosis not present

## 2014-06-10 DIAGNOSIS — G8190 Hemiplegia, unspecified affecting unspecified side: Secondary | ICD-10-CM | POA: Diagnosis not present

## 2014-06-10 DIAGNOSIS — R1312 Dysphagia, oropharyngeal phase: Secondary | ICD-10-CM | POA: Diagnosis not present

## 2014-06-10 DIAGNOSIS — I6012 Nontraumatic subarachnoid hemorrhage from left middle cerebral artery: Secondary | ICD-10-CM | POA: Diagnosis not present

## 2014-06-11 DIAGNOSIS — G8190 Hemiplegia, unspecified affecting unspecified side: Secondary | ICD-10-CM | POA: Diagnosis not present

## 2014-06-11 DIAGNOSIS — I6012 Nontraumatic subarachnoid hemorrhage from left middle cerebral artery: Secondary | ICD-10-CM | POA: Diagnosis not present

## 2014-06-11 DIAGNOSIS — I69822 Dysarthria following other cerebrovascular disease: Secondary | ICD-10-CM | POA: Diagnosis not present

## 2014-06-11 DIAGNOSIS — R1312 Dysphagia, oropharyngeal phase: Secondary | ICD-10-CM | POA: Diagnosis not present

## 2014-06-11 DIAGNOSIS — R489 Unspecified symbolic dysfunctions: Secondary | ICD-10-CM | POA: Diagnosis not present

## 2014-06-11 DIAGNOSIS — M6281 Muscle weakness (generalized): Secondary | ICD-10-CM | POA: Diagnosis not present

## 2014-06-12 DIAGNOSIS — R1312 Dysphagia, oropharyngeal phase: Secondary | ICD-10-CM | POA: Diagnosis not present

## 2014-06-12 DIAGNOSIS — I69822 Dysarthria following other cerebrovascular disease: Secondary | ICD-10-CM | POA: Diagnosis not present

## 2014-06-12 DIAGNOSIS — I6012 Nontraumatic subarachnoid hemorrhage from left middle cerebral artery: Secondary | ICD-10-CM | POA: Diagnosis not present

## 2014-06-12 DIAGNOSIS — M6281 Muscle weakness (generalized): Secondary | ICD-10-CM | POA: Diagnosis not present

## 2014-06-12 DIAGNOSIS — R489 Unspecified symbolic dysfunctions: Secondary | ICD-10-CM | POA: Diagnosis not present

## 2014-06-12 DIAGNOSIS — G8190 Hemiplegia, unspecified affecting unspecified side: Secondary | ICD-10-CM | POA: Diagnosis not present

## 2014-06-13 DIAGNOSIS — G8190 Hemiplegia, unspecified affecting unspecified side: Secondary | ICD-10-CM | POA: Diagnosis not present

## 2014-06-13 DIAGNOSIS — R1312 Dysphagia, oropharyngeal phase: Secondary | ICD-10-CM | POA: Diagnosis not present

## 2014-06-13 DIAGNOSIS — M6281 Muscle weakness (generalized): Secondary | ICD-10-CM | POA: Diagnosis not present

## 2014-06-13 DIAGNOSIS — R489 Unspecified symbolic dysfunctions: Secondary | ICD-10-CM | POA: Diagnosis not present

## 2014-06-13 DIAGNOSIS — I6012 Nontraumatic subarachnoid hemorrhage from left middle cerebral artery: Secondary | ICD-10-CM | POA: Diagnosis not present

## 2014-06-13 DIAGNOSIS — I69822 Dysarthria following other cerebrovascular disease: Secondary | ICD-10-CM | POA: Diagnosis not present

## 2014-06-14 DIAGNOSIS — I6012 Nontraumatic subarachnoid hemorrhage from left middle cerebral artery: Secondary | ICD-10-CM | POA: Diagnosis not present

## 2014-06-14 DIAGNOSIS — I69822 Dysarthria following other cerebrovascular disease: Secondary | ICD-10-CM | POA: Diagnosis not present

## 2014-06-14 DIAGNOSIS — R489 Unspecified symbolic dysfunctions: Secondary | ICD-10-CM | POA: Diagnosis not present

## 2014-06-14 DIAGNOSIS — M6281 Muscle weakness (generalized): Secondary | ICD-10-CM | POA: Diagnosis not present

## 2014-06-14 DIAGNOSIS — G8190 Hemiplegia, unspecified affecting unspecified side: Secondary | ICD-10-CM | POA: Diagnosis not present

## 2014-06-14 DIAGNOSIS — R1312 Dysphagia, oropharyngeal phase: Secondary | ICD-10-CM | POA: Diagnosis not present

## 2014-06-17 DIAGNOSIS — R489 Unspecified symbolic dysfunctions: Secondary | ICD-10-CM | POA: Diagnosis not present

## 2014-06-17 DIAGNOSIS — I69822 Dysarthria following other cerebrovascular disease: Secondary | ICD-10-CM | POA: Diagnosis not present

## 2014-06-17 DIAGNOSIS — I6012 Nontraumatic subarachnoid hemorrhage from left middle cerebral artery: Secondary | ICD-10-CM | POA: Diagnosis not present

## 2014-06-17 DIAGNOSIS — M6281 Muscle weakness (generalized): Secondary | ICD-10-CM | POA: Diagnosis not present

## 2014-06-17 DIAGNOSIS — G8190 Hemiplegia, unspecified affecting unspecified side: Secondary | ICD-10-CM | POA: Diagnosis not present

## 2014-06-17 DIAGNOSIS — R1312 Dysphagia, oropharyngeal phase: Secondary | ICD-10-CM | POA: Diagnosis not present

## 2014-06-18 DIAGNOSIS — R1312 Dysphagia, oropharyngeal phase: Secondary | ICD-10-CM | POA: Diagnosis not present

## 2014-06-18 DIAGNOSIS — I69822 Dysarthria following other cerebrovascular disease: Secondary | ICD-10-CM | POA: Diagnosis not present

## 2014-06-18 DIAGNOSIS — R489 Unspecified symbolic dysfunctions: Secondary | ICD-10-CM | POA: Diagnosis not present

## 2014-06-18 DIAGNOSIS — M6281 Muscle weakness (generalized): Secondary | ICD-10-CM | POA: Diagnosis not present

## 2014-06-18 DIAGNOSIS — G8190 Hemiplegia, unspecified affecting unspecified side: Secondary | ICD-10-CM | POA: Diagnosis not present

## 2014-06-18 DIAGNOSIS — I6012 Nontraumatic subarachnoid hemorrhage from left middle cerebral artery: Secondary | ICD-10-CM | POA: Diagnosis not present

## 2014-06-19 DIAGNOSIS — I6012 Nontraumatic subarachnoid hemorrhage from left middle cerebral artery: Secondary | ICD-10-CM | POA: Diagnosis not present

## 2014-06-19 DIAGNOSIS — R1310 Dysphagia, unspecified: Secondary | ICD-10-CM | POA: Diagnosis not present

## 2014-06-19 DIAGNOSIS — I679 Cerebrovascular disease, unspecified: Secondary | ICD-10-CM | POA: Diagnosis not present

## 2014-06-19 DIAGNOSIS — G8190 Hemiplegia, unspecified affecting unspecified side: Secondary | ICD-10-CM | POA: Diagnosis not present

## 2014-06-19 DIAGNOSIS — R1312 Dysphagia, oropharyngeal phase: Secondary | ICD-10-CM | POA: Diagnosis not present

## 2014-06-19 DIAGNOSIS — I69822 Dysarthria following other cerebrovascular disease: Secondary | ICD-10-CM | POA: Diagnosis not present

## 2014-06-19 DIAGNOSIS — R489 Unspecified symbolic dysfunctions: Secondary | ICD-10-CM | POA: Diagnosis not present

## 2014-06-19 DIAGNOSIS — I69351 Hemiplegia and hemiparesis following cerebral infarction affecting right dominant side: Secondary | ICD-10-CM | POA: Diagnosis not present

## 2014-06-19 DIAGNOSIS — I69393 Ataxia following cerebral infarction: Secondary | ICD-10-CM | POA: Diagnosis not present

## 2014-06-19 DIAGNOSIS — I639 Cerebral infarction, unspecified: Secondary | ICD-10-CM | POA: Diagnosis not present

## 2014-06-19 DIAGNOSIS — M6281 Muscle weakness (generalized): Secondary | ICD-10-CM | POA: Diagnosis not present

## 2014-06-20 DIAGNOSIS — I69822 Dysarthria following other cerebrovascular disease: Secondary | ICD-10-CM | POA: Diagnosis not present

## 2014-06-20 DIAGNOSIS — M6281 Muscle weakness (generalized): Secondary | ICD-10-CM | POA: Diagnosis not present

## 2014-06-20 DIAGNOSIS — G8190 Hemiplegia, unspecified affecting unspecified side: Secondary | ICD-10-CM | POA: Diagnosis not present

## 2014-06-20 DIAGNOSIS — R489 Unspecified symbolic dysfunctions: Secondary | ICD-10-CM | POA: Diagnosis not present

## 2014-06-20 DIAGNOSIS — I6012 Nontraumatic subarachnoid hemorrhage from left middle cerebral artery: Secondary | ICD-10-CM | POA: Diagnosis not present

## 2014-06-20 DIAGNOSIS — R1312 Dysphagia, oropharyngeal phase: Secondary | ICD-10-CM | POA: Diagnosis not present

## 2014-06-21 DIAGNOSIS — G8190 Hemiplegia, unspecified affecting unspecified side: Secondary | ICD-10-CM | POA: Diagnosis not present

## 2014-06-21 DIAGNOSIS — R1312 Dysphagia, oropharyngeal phase: Secondary | ICD-10-CM | POA: Diagnosis not present

## 2014-06-21 DIAGNOSIS — I69822 Dysarthria following other cerebrovascular disease: Secondary | ICD-10-CM | POA: Diagnosis not present

## 2014-06-21 DIAGNOSIS — R489 Unspecified symbolic dysfunctions: Secondary | ICD-10-CM | POA: Diagnosis not present

## 2014-06-21 DIAGNOSIS — I6012 Nontraumatic subarachnoid hemorrhage from left middle cerebral artery: Secondary | ICD-10-CM | POA: Diagnosis not present

## 2014-06-21 DIAGNOSIS — M6281 Muscle weakness (generalized): Secondary | ICD-10-CM | POA: Diagnosis not present

## 2014-06-24 DIAGNOSIS — G8190 Hemiplegia, unspecified affecting unspecified side: Secondary | ICD-10-CM | POA: Diagnosis not present

## 2014-06-24 DIAGNOSIS — I69822 Dysarthria following other cerebrovascular disease: Secondary | ICD-10-CM | POA: Diagnosis not present

## 2014-06-24 DIAGNOSIS — I6012 Nontraumatic subarachnoid hemorrhage from left middle cerebral artery: Secondary | ICD-10-CM | POA: Diagnosis not present

## 2014-06-24 DIAGNOSIS — M6281 Muscle weakness (generalized): Secondary | ICD-10-CM | POA: Diagnosis not present

## 2014-06-24 DIAGNOSIS — R489 Unspecified symbolic dysfunctions: Secondary | ICD-10-CM | POA: Diagnosis not present

## 2014-06-24 DIAGNOSIS — R1312 Dysphagia, oropharyngeal phase: Secondary | ICD-10-CM | POA: Diagnosis not present

## 2014-06-25 DIAGNOSIS — M6281 Muscle weakness (generalized): Secondary | ICD-10-CM | POA: Diagnosis not present

## 2014-06-25 DIAGNOSIS — R489 Unspecified symbolic dysfunctions: Secondary | ICD-10-CM | POA: Diagnosis not present

## 2014-06-25 DIAGNOSIS — I69822 Dysarthria following other cerebrovascular disease: Secondary | ICD-10-CM | POA: Diagnosis not present

## 2014-06-25 DIAGNOSIS — I6012 Nontraumatic subarachnoid hemorrhage from left middle cerebral artery: Secondary | ICD-10-CM | POA: Diagnosis not present

## 2014-06-25 DIAGNOSIS — R1312 Dysphagia, oropharyngeal phase: Secondary | ICD-10-CM | POA: Diagnosis not present

## 2014-06-25 DIAGNOSIS — G8190 Hemiplegia, unspecified affecting unspecified side: Secondary | ICD-10-CM | POA: Diagnosis not present

## 2014-06-26 DIAGNOSIS — I6012 Nontraumatic subarachnoid hemorrhage from left middle cerebral artery: Secondary | ICD-10-CM | POA: Diagnosis not present

## 2014-06-26 DIAGNOSIS — R489 Unspecified symbolic dysfunctions: Secondary | ICD-10-CM | POA: Diagnosis not present

## 2014-06-26 DIAGNOSIS — G8190 Hemiplegia, unspecified affecting unspecified side: Secondary | ICD-10-CM | POA: Diagnosis not present

## 2014-06-26 DIAGNOSIS — R1312 Dysphagia, oropharyngeal phase: Secondary | ICD-10-CM | POA: Diagnosis not present

## 2014-06-26 DIAGNOSIS — I69822 Dysarthria following other cerebrovascular disease: Secondary | ICD-10-CM | POA: Diagnosis not present

## 2014-06-26 DIAGNOSIS — M6281 Muscle weakness (generalized): Secondary | ICD-10-CM | POA: Diagnosis not present

## 2014-06-27 DIAGNOSIS — R489 Unspecified symbolic dysfunctions: Secondary | ICD-10-CM | POA: Diagnosis not present

## 2014-06-27 DIAGNOSIS — G8190 Hemiplegia, unspecified affecting unspecified side: Secondary | ICD-10-CM | POA: Diagnosis not present

## 2014-06-27 DIAGNOSIS — I6012 Nontraumatic subarachnoid hemorrhage from left middle cerebral artery: Secondary | ICD-10-CM | POA: Diagnosis not present

## 2014-06-27 DIAGNOSIS — R1312 Dysphagia, oropharyngeal phase: Secondary | ICD-10-CM | POA: Diagnosis not present

## 2014-06-27 DIAGNOSIS — I69822 Dysarthria following other cerebrovascular disease: Secondary | ICD-10-CM | POA: Diagnosis not present

## 2014-06-27 DIAGNOSIS — M6281 Muscle weakness (generalized): Secondary | ICD-10-CM | POA: Diagnosis not present

## 2014-06-28 ENCOUNTER — Encounter: Payer: Self-pay | Admitting: Vascular Surgery

## 2014-06-28 DIAGNOSIS — R489 Unspecified symbolic dysfunctions: Secondary | ICD-10-CM | POA: Diagnosis not present

## 2014-06-28 DIAGNOSIS — I69822 Dysarthria following other cerebrovascular disease: Secondary | ICD-10-CM | POA: Diagnosis not present

## 2014-06-28 DIAGNOSIS — R1312 Dysphagia, oropharyngeal phase: Secondary | ICD-10-CM | POA: Diagnosis not present

## 2014-06-28 DIAGNOSIS — I6012 Nontraumatic subarachnoid hemorrhage from left middle cerebral artery: Secondary | ICD-10-CM | POA: Diagnosis not present

## 2014-06-28 DIAGNOSIS — G8190 Hemiplegia, unspecified affecting unspecified side: Secondary | ICD-10-CM | POA: Diagnosis not present

## 2014-06-28 DIAGNOSIS — M6281 Muscle weakness (generalized): Secondary | ICD-10-CM | POA: Diagnosis not present

## 2014-06-29 DIAGNOSIS — R489 Unspecified symbolic dysfunctions: Secondary | ICD-10-CM | POA: Diagnosis not present

## 2014-06-29 DIAGNOSIS — M6281 Muscle weakness (generalized): Secondary | ICD-10-CM | POA: Diagnosis not present

## 2014-06-29 DIAGNOSIS — I69822 Dysarthria following other cerebrovascular disease: Secondary | ICD-10-CM | POA: Diagnosis not present

## 2014-06-29 DIAGNOSIS — G8190 Hemiplegia, unspecified affecting unspecified side: Secondary | ICD-10-CM | POA: Diagnosis not present

## 2014-06-29 DIAGNOSIS — I6012 Nontraumatic subarachnoid hemorrhage from left middle cerebral artery: Secondary | ICD-10-CM | POA: Diagnosis not present

## 2014-06-29 DIAGNOSIS — R1312 Dysphagia, oropharyngeal phase: Secondary | ICD-10-CM | POA: Diagnosis not present

## 2014-07-01 ENCOUNTER — Encounter: Payer: Self-pay | Admitting: Adult Health

## 2014-07-01 ENCOUNTER — Non-Acute Institutional Stay (SKILLED_NURSING_FACILITY): Payer: Medicare Other | Admitting: Adult Health

## 2014-07-01 DIAGNOSIS — I6012 Nontraumatic subarachnoid hemorrhage from left middle cerebral artery: Secondary | ICD-10-CM | POA: Diagnosis not present

## 2014-07-01 DIAGNOSIS — I63512 Cerebral infarction due to unspecified occlusion or stenosis of left middle cerebral artery: Secondary | ICD-10-CM

## 2014-07-01 DIAGNOSIS — G8190 Hemiplegia, unspecified affecting unspecified side: Secondary | ICD-10-CM

## 2014-07-01 DIAGNOSIS — F0150 Vascular dementia without behavioral disturbance: Secondary | ICD-10-CM | POA: Insufficient documentation

## 2014-07-01 DIAGNOSIS — R1312 Dysphagia, oropharyngeal phase: Secondary | ICD-10-CM | POA: Diagnosis not present

## 2014-07-01 DIAGNOSIS — E7800 Pure hypercholesterolemia, unspecified: Secondary | ICD-10-CM

## 2014-07-01 DIAGNOSIS — I480 Paroxysmal atrial fibrillation: Secondary | ICD-10-CM | POA: Diagnosis not present

## 2014-07-01 DIAGNOSIS — G8191 Hemiplegia, unspecified affecting right dominant side: Secondary | ICD-10-CM

## 2014-07-01 DIAGNOSIS — R489 Unspecified symbolic dysfunctions: Secondary | ICD-10-CM | POA: Diagnosis not present

## 2014-07-01 DIAGNOSIS — I10 Essential (primary) hypertension: Secondary | ICD-10-CM

## 2014-07-01 DIAGNOSIS — I69822 Dysarthria following other cerebrovascular disease: Secondary | ICD-10-CM | POA: Diagnosis not present

## 2014-07-01 DIAGNOSIS — M6281 Muscle weakness (generalized): Secondary | ICD-10-CM | POA: Diagnosis not present

## 2014-07-01 NOTE — Progress Notes (Signed)
Patient ID: [name], female   DOB: [birth year], 81 y.o.   MRN: [id number]     Patient Care Team: Merlene Laughter, MD as PCP - General (Internal Medicine) Larina Earthly, MD as Consulting Physician (Vascular Surgery) Ranelle Oyster, MD as Consulting Physician (Physical Medicine and Rehabilitation)  Nursing Home Location:  [facility]   Code Status: Full code   Place of Service: SNF (31)  Chief Complaint  Patient presents with  . Medical Management of Chronic Issues    HPI:  81 y.o. female residing at [facility], rehab section. I am here to review her chronic medical issues.  VS have been stable over the past month with a few outlier BP's that were elevated. She has been progressing with PT and OT. She is ambulatory with a walker but needs assistance with dressing and other self care issues. She was admitted to the hospital 05/13/14-05/27/14 due to a cardio embolic left MCA branch infarct with hemorrhagic transformation after TPA. She has residual right sided weakness with dysphagia.  She is eating well and the staff has no complaints regarding her care today.  She is going to skilled care after her stay when a bed becomes available.       Review of Systems:  Review of Systems  Constitutional: Negative for fever, activity change, appetite change and fatigue.  HENT: Negative for congestion.   Respiratory: Negative for cough and shortness of breath.   Cardiovascular: Negative for chest pain and leg swelling.  Gastrointestinal: Negative for abdominal pain and abdominal distention.  Genitourinary: Negative for dysuria.  Skin: Negative for rash and wound.  Neurological: Positive for facial asymmetry, speech difficulty and weakness. Negative for tremors and syncope.  Psychiatric/Behavioral: Positive for confusion. Negative for behavioral problems and agitation.    Medications: Patient's Medications  New Prescriptions   No medications on  file  Previous Medications   ASPIRIN EC 81 MG TABLET    Take 81 mg by mouth daily.   CALCIUM CARBONATE-VITAMIN D (CALTRATE 600+D PO)    Take 1 tablet by mouth daily.   CARTIA XT 120 MG 24 HR CAPSULE    Take 120 mg by mouth.    DONEPEZIL (ARICEPT) 5 MG TABLET    Take 5 mg by mouth at bedtime.   LEVOTHYROXINE (SYNTHROID) 50 MCG TABLET    Take 25 mcg by mouth daily before breakfast.   MULTAQ 400 MG TABLET    TAKE 1 TABLET BY MOUTH TWICE DAILY   NITROGLYCERIN (NITROSTAT) 0.4 MG SL TABLET    Place 0.4 mg under the tongue every 5 (five) minutes as needed for chest pain (MAX 3 TABLETS).    ROSUVASTATIN (CRESTOR) 5 MG TABLET    Take 5 mg by mouth at bedtime.    ZETIA 10 MG TABLET    TAKE 1 TABLET BY MOUTH ONCE DAILY  Modified Medications   No medications on file  Discontinued Medications   ASPIRIN 325 MG TABLET    Take 325 mg by mouth daily.     Physical Exam:  Filed Vitals:   07/01/14 1103  BP: 130/69  Pulse: 86  Temp: 98.8 F (37.1 C)  Resp: 24  Weight: 120 lb (54.432 kg)  SpO2: 97%    Physical Exam  Constitutional: No distress.  frail  Neck: No JVD present.  Abdominal: Soft. Bowel sounds are normal. She exhibits no distension. There is no tenderness.  Neurological: She is alert. A cranial nerve deficit is present.  Right facial  droop. Oriented x 2. Able to f/c.    Skin: Skin is warm and dry. She is not diaphoretic.  Psychiatric: Affect normal.    Labs reviewed/Significant Diagnostic Results:  Basic Metabolic Panel:  Recent Labs  81/19/14 0703 05/26/14 0645 05/27/14 0531  NA 138 136 138  K 3.9 3.9 4.1  CL 107 107 109  CO2 22 18* 21*  GLUCOSE 103* 118* 122*  BUN CREATININE 1.22* 1.18* 1.30*  CALCIUM 8.9 9.1 8.9   Liver Function Tests:  Recent Labs  05/08/14 1652 05/14/14 1420  AST 38* 23  ALT 29 20  ALKPHOS 63 64  BILITOT 0.6 0.4  PROT 7.1 7.0  ALBUMIN 3.6 3.1*   No results for input(s): LIPASE, AMYLASE in the last 8760 hours. No results  for input(s): AMMONIA in the last 8760 hours. CBC:  Recent Labs  05/08/14 1652  05/13/14 0235 05/14/14 1420 05/17/14 0733  WBC 11.6*  < > 12.0* 10.6* 8.2  NEUTROABS 7.6  --  8.7* 7.5  --   HGB 14.9  < > 14.1 14.6 14.4  HCT 44.1  < > 42.1 43.2 43.2  MCV 91.5  < > 91.1 90.8 90.4  PLT 221  < > 229 273 293  < > = values in this interval not displayed. CBG:  Recent Labs  05/25/14 2044 05/26/14 0612 05/27/14 1150  GLUCAP 130* 107* 124*   TSH: No results for input(s): TSH in the last 8760 hours. A1C: Lab Results  Component Value Date   HGBA1C 7.5* 05/09/2014   Lipid Panel:  Recent Labs  05/09/14 0333  CHOL 137  HDL 47  LDLCALC 74  TRIG 79  CHOLHDL 2.9       Assessment/Plan  1. Paroxysmal atrial fibrillation Rate controlled with Cardizem and Multaq. Not on anticoagulation due to fall risk.   2. Left middle cerebral artery stroke No new events. Maintained on aspirin 81 mg.   3. Right hemiparesis Improved, continues to work with PT and OT. Able to ambulate but due to dementia and limited arm movement she is not able to dress herself  4. Pure hypercholesterolemia Currently on crestor and zetia. Lipid panel due in october  5. Essential hypertension, benign Stable, continue current meds.   6. Vascular dementia, without behavioral disturbance Consider Namenda at next visit, we would need to discuss this with her family. Her BIMS score is 6/15.  I will ask the staff to perform an MMSE.  7. Bilateral carotid stenosis F/U with Gretta Began. I understand that her family is leaning toward a conservative approach regarding this issue  8. PPM Her family has elected not to change her battery. They are also discussing her code status and will get back with the staff.    Labs due with next routine visit.  [name], ANP [facility] [phone]

## 2014-07-02 ENCOUNTER — Encounter: Payer: Medicare Other | Admitting: Vascular Surgery

## 2014-07-02 DIAGNOSIS — I6012 Nontraumatic subarachnoid hemorrhage from left middle cerebral artery: Secondary | ICD-10-CM | POA: Diagnosis not present

## 2014-07-02 DIAGNOSIS — G8190 Hemiplegia, unspecified affecting unspecified side: Secondary | ICD-10-CM | POA: Diagnosis not present

## 2014-07-02 DIAGNOSIS — R489 Unspecified symbolic dysfunctions: Secondary | ICD-10-CM | POA: Diagnosis not present

## 2014-07-02 DIAGNOSIS — B351 Tinea unguium: Secondary | ICD-10-CM | POA: Diagnosis not present

## 2014-07-02 DIAGNOSIS — L84 Corns and callosities: Secondary | ICD-10-CM | POA: Diagnosis not present

## 2014-07-02 DIAGNOSIS — E1159 Type 2 diabetes mellitus with other circulatory complications: Secondary | ICD-10-CM | POA: Diagnosis not present

## 2014-07-02 DIAGNOSIS — R1312 Dysphagia, oropharyngeal phase: Secondary | ICD-10-CM | POA: Diagnosis not present

## 2014-07-02 DIAGNOSIS — I69822 Dysarthria following other cerebrovascular disease: Secondary | ICD-10-CM | POA: Diagnosis not present

## 2014-07-02 DIAGNOSIS — M6281 Muscle weakness (generalized): Secondary | ICD-10-CM | POA: Diagnosis not present

## 2014-07-03 DIAGNOSIS — R1312 Dysphagia, oropharyngeal phase: Secondary | ICD-10-CM | POA: Diagnosis not present

## 2014-07-03 DIAGNOSIS — R489 Unspecified symbolic dysfunctions: Secondary | ICD-10-CM | POA: Diagnosis not present

## 2014-07-03 DIAGNOSIS — I6012 Nontraumatic subarachnoid hemorrhage from left middle cerebral artery: Secondary | ICD-10-CM | POA: Diagnosis not present

## 2014-07-03 DIAGNOSIS — I69822 Dysarthria following other cerebrovascular disease: Secondary | ICD-10-CM | POA: Diagnosis not present

## 2014-07-03 DIAGNOSIS — G8190 Hemiplegia, unspecified affecting unspecified side: Secondary | ICD-10-CM | POA: Diagnosis not present

## 2014-07-03 DIAGNOSIS — M6281 Muscle weakness (generalized): Secondary | ICD-10-CM | POA: Diagnosis not present

## 2014-07-04 DIAGNOSIS — M6281 Muscle weakness (generalized): Secondary | ICD-10-CM | POA: Diagnosis not present

## 2014-07-04 DIAGNOSIS — R489 Unspecified symbolic dysfunctions: Secondary | ICD-10-CM | POA: Diagnosis not present

## 2014-07-04 DIAGNOSIS — I69822 Dysarthria following other cerebrovascular disease: Secondary | ICD-10-CM | POA: Diagnosis not present

## 2014-07-04 DIAGNOSIS — R1312 Dysphagia, oropharyngeal phase: Secondary | ICD-10-CM | POA: Diagnosis not present

## 2014-07-04 DIAGNOSIS — I6012 Nontraumatic subarachnoid hemorrhage from left middle cerebral artery: Secondary | ICD-10-CM | POA: Diagnosis not present

## 2014-07-04 DIAGNOSIS — G8190 Hemiplegia, unspecified affecting unspecified side: Secondary | ICD-10-CM | POA: Diagnosis not present

## 2014-07-05 DIAGNOSIS — M6281 Muscle weakness (generalized): Secondary | ICD-10-CM | POA: Diagnosis not present

## 2014-07-05 DIAGNOSIS — R1312 Dysphagia, oropharyngeal phase: Secondary | ICD-10-CM | POA: Diagnosis not present

## 2014-07-05 DIAGNOSIS — G8190 Hemiplegia, unspecified affecting unspecified side: Secondary | ICD-10-CM | POA: Diagnosis not present

## 2014-07-05 DIAGNOSIS — I69822 Dysarthria following other cerebrovascular disease: Secondary | ICD-10-CM | POA: Diagnosis not present

## 2014-07-05 DIAGNOSIS — R489 Unspecified symbolic dysfunctions: Secondary | ICD-10-CM | POA: Diagnosis not present

## 2014-07-05 DIAGNOSIS — I6012 Nontraumatic subarachnoid hemorrhage from left middle cerebral artery: Secondary | ICD-10-CM | POA: Diagnosis not present

## 2014-07-08 DIAGNOSIS — I6012 Nontraumatic subarachnoid hemorrhage from left middle cerebral artery: Secondary | ICD-10-CM | POA: Diagnosis not present

## 2014-07-08 DIAGNOSIS — R489 Unspecified symbolic dysfunctions: Secondary | ICD-10-CM | POA: Diagnosis not present

## 2014-07-08 DIAGNOSIS — R1312 Dysphagia, oropharyngeal phase: Secondary | ICD-10-CM | POA: Diagnosis not present

## 2014-07-08 DIAGNOSIS — I69822 Dysarthria following other cerebrovascular disease: Secondary | ICD-10-CM | POA: Diagnosis not present

## 2014-07-08 DIAGNOSIS — G8190 Hemiplegia, unspecified affecting unspecified side: Secondary | ICD-10-CM | POA: Diagnosis not present

## 2014-07-08 DIAGNOSIS — M6281 Muscle weakness (generalized): Secondary | ICD-10-CM | POA: Diagnosis not present

## 2014-07-09 DIAGNOSIS — R489 Unspecified symbolic dysfunctions: Secondary | ICD-10-CM | POA: Diagnosis not present

## 2014-07-09 DIAGNOSIS — R1312 Dysphagia, oropharyngeal phase: Secondary | ICD-10-CM | POA: Diagnosis not present

## 2014-07-09 DIAGNOSIS — G8190 Hemiplegia, unspecified affecting unspecified side: Secondary | ICD-10-CM | POA: Diagnosis not present

## 2014-07-09 DIAGNOSIS — M6281 Muscle weakness (generalized): Secondary | ICD-10-CM | POA: Diagnosis not present

## 2014-07-09 DIAGNOSIS — I6012 Nontraumatic subarachnoid hemorrhage from left middle cerebral artery: Secondary | ICD-10-CM | POA: Diagnosis not present

## 2014-07-09 DIAGNOSIS — I69822 Dysarthria following other cerebrovascular disease: Secondary | ICD-10-CM | POA: Diagnosis not present

## 2014-07-10 DIAGNOSIS — R1312 Dysphagia, oropharyngeal phase: Secondary | ICD-10-CM | POA: Diagnosis not present

## 2014-07-10 DIAGNOSIS — I6012 Nontraumatic subarachnoid hemorrhage from left middle cerebral artery: Secondary | ICD-10-CM | POA: Diagnosis not present

## 2014-07-10 DIAGNOSIS — I69822 Dysarthria following other cerebrovascular disease: Secondary | ICD-10-CM | POA: Diagnosis not present

## 2014-07-10 DIAGNOSIS — G8190 Hemiplegia, unspecified affecting unspecified side: Secondary | ICD-10-CM | POA: Diagnosis not present

## 2014-07-10 DIAGNOSIS — M6281 Muscle weakness (generalized): Secondary | ICD-10-CM | POA: Diagnosis not present

## 2014-07-10 DIAGNOSIS — R489 Unspecified symbolic dysfunctions: Secondary | ICD-10-CM | POA: Diagnosis not present

## 2014-07-11 DIAGNOSIS — M6281 Muscle weakness (generalized): Secondary | ICD-10-CM | POA: Diagnosis not present

## 2014-07-11 DIAGNOSIS — R1312 Dysphagia, oropharyngeal phase: Secondary | ICD-10-CM | POA: Diagnosis not present

## 2014-07-11 DIAGNOSIS — I69822 Dysarthria following other cerebrovascular disease: Secondary | ICD-10-CM | POA: Diagnosis not present

## 2014-07-11 DIAGNOSIS — I6012 Nontraumatic subarachnoid hemorrhage from left middle cerebral artery: Secondary | ICD-10-CM | POA: Diagnosis not present

## 2014-07-11 DIAGNOSIS — G8190 Hemiplegia, unspecified affecting unspecified side: Secondary | ICD-10-CM | POA: Diagnosis not present

## 2014-07-11 DIAGNOSIS — R489 Unspecified symbolic dysfunctions: Secondary | ICD-10-CM | POA: Diagnosis not present

## 2014-07-12 ENCOUNTER — Encounter: Payer: Self-pay | Admitting: Physical Medicine & Rehabilitation

## 2014-07-12 ENCOUNTER — Encounter: Payer: Medicare Other | Attending: Physical Medicine & Rehabilitation | Admitting: Physical Medicine & Rehabilitation

## 2014-07-12 VITALS — BP 147/79 | HR 92 | Resp 14

## 2014-07-12 DIAGNOSIS — I6012 Nontraumatic subarachnoid hemorrhage from left middle cerebral artery: Secondary | ICD-10-CM | POA: Diagnosis not present

## 2014-07-12 DIAGNOSIS — I63512 Cerebral infarction due to unspecified occlusion or stenosis of left middle cerebral artery: Secondary | ICD-10-CM

## 2014-07-12 DIAGNOSIS — M6281 Muscle weakness (generalized): Secondary | ICD-10-CM | POA: Diagnosis not present

## 2014-07-12 DIAGNOSIS — I639 Cerebral infarction, unspecified: Secondary | ICD-10-CM | POA: Diagnosis not present

## 2014-07-12 DIAGNOSIS — F0150 Vascular dementia without behavioral disturbance: Secondary | ICD-10-CM

## 2014-07-12 DIAGNOSIS — G8190 Hemiplegia, unspecified affecting unspecified side: Secondary | ICD-10-CM | POA: Diagnosis not present

## 2014-07-12 DIAGNOSIS — R1312 Dysphagia, oropharyngeal phase: Secondary | ICD-10-CM | POA: Diagnosis not present

## 2014-07-12 DIAGNOSIS — I69822 Dysarthria following other cerebrovascular disease: Secondary | ICD-10-CM | POA: Diagnosis not present

## 2014-07-12 DIAGNOSIS — R489 Unspecified symbolic dysfunctions: Secondary | ICD-10-CM | POA: Diagnosis not present

## 2014-07-12 DIAGNOSIS — G8191 Hemiplegia, unspecified affecting right dominant side: Secondary | ICD-10-CM

## 2014-07-12 NOTE — Progress Notes (Signed)
Subjective:    Patient ID: [name], female    DOB: 1918-07-29, 81 y.o.   MRN: [id number]  HPI   [name] is here in follow up of her left MCA infarct. She has been at [facility] since discharge from [facility]. No new medical complications appear to have taken place. She is receiving therapy at the facility although I don't have any reports in regard to her progress. Lady here from [facility] reports ongoing memory issues which haven't changed much over the last month or so.  [name] does not know why she's here at my office today, nor does she remember me from the hospital. She denies some of the problems which she is being treated for ever occurred.      Pain Inventory Average Pain 0 Pain Right Now 0 My pain is no pain  In the last 24 hours, has pain interfered with the following? General activity 0 Relation with others 0 Enjoyment of life 0 What TIME of day is your pain at its worst? no pain Sleep (in general) Good  Pain is worse with: no pain Pain improves with: no pain Relief from Meds: no pain  Mobility use a wheelchair needs help with transfers  Function retired  Neuro/Psych No problems in this area  Prior Studies hospital f/u  Physicians involved in your care hospital f/u   Family History  Problem Relation Age of Onset  . Heart disease Mother   . Heart disease Father   . Diabetes Father   . Peripheral vascular disease Brother   . Cerebral palsy Son    History   Social History  . Marital Status: Widowed    Spouse Name: N/A  . Number of Children: 3  . Years of Education: N/A   Occupational History  .     Social History Main Topics  . Smoking status: Never Smoker   . Smokeless tobacco: Not on file  . Alcohol Use: Yes     Comment: occasional glass of wine  . Drug Use: No  . Sexual Activity: Not on file   Other Topics Concern  . None   Social History Narrative   Past Surgical History  Procedure  Laterality Date  . Carpal tunnel release Right 02/07/09  . Vesicovaginal fistula closure w/ tah    . Oophorectomy      With hysterectomy. One ovary removed-fibroids.  . Pacemaker insertion  2004    Gen change 08/04/09 by Dr. Amil Amen. New pacing generator is a Medtronic EnRhythm, model D1939726, serial [id number]  . Pacemaker generator change  08/04/09    By Dr. Amil Amen. New pacing generator is a Medtronic EnRhythm, model D1939726, serial [id number] H  . Percutaneous coronary stent intervention (pci-s)  05/08/02    (3 tandem Cypher stent), proximal and mid LAD  . Coronary angioplasty with stent placement  09/21/09    By Dr. Amil Amen. ACS, NSTEMI - BM stent RCA   Past Medical History  Diagnosis Date  . Pure hypercholesterolemia   . Hyperlipidemia     Tolerating medication well as of 11/27/12  . Hypothyroidism   . Carpal tunnel syndrome   . Benign hypertensive heart disease without heart failure   . Coronary atherosclerosis of native coronary artery   . Spinal stenosis of lumbar region   . Osteopenia   . Postsurgical percutaneous transluminal coronary angioplasty status   . Essential hypertension, benign   . Benign hypertensive kidney disease with chronic kidney disease stage I through stage IV, or  unspecified   . Diabetes mellitus with chronic kidney disease   . NSTEMI (non-ST elevated myocardial infarction) 09/21/09    BM stent RCA  . Atrial fibrillation   . Tachy-brady syndrome     s/p PPM, battery change 08/01/09  . Sinoatrial node dysfunction   . Chronic anemia   . Osteoarthritis   . H/O echocardiogram 07/19/09    EF = 60-65%  . Hyperglycemia     Mild  . Microalbuminuria 02/2010  . Vitamin D deficiency 10/2011  . Chronic renal disease, stage III    BP 147/79 mmHg  Pulse 92  Resp 14  SpO2 98%  Opioid Risk Score:   Fall Risk Score:  `1  Depression screen PHQ 2/9  Depression screen PHQ 2/9 07/01/2014  Decreased Interest 0  Down, Depressed, Hopeless 0  PHQ - 2 Score 0      Review of Systems  All other systems reviewed and are negative.      Objective:   Physical Exam  Gen: pt pleasant, alert,  HENT: oral mucosa pink, dentition good Eyes: EOM are normal.  Neck: Normal range of motion. Neck supple. No thyromegaly present.  Cardiovascular:  Cardiac rate controlled without murmur Respiratory: Effort normal and breath sounds normal. No respiratory distress.no wheezes, rales  GI: Soft. Bowel sounds are normal. She exhibits no distension. No tenderness Neurological: She is alert. Does not remember date, living address. Right central 7 and mild tongue deviation have improved but slight residual seen.. She is hard of hearing bilaterally. Language has improved.  RUE: 3+ to 4/5 deltoid, biceps, triceps, trace to 3+/5 wrist and hand. RLE: 4- hf, ke, and adfl/apf. Sensation intact to light touch and pain in right upper, lower, and face. Right tone: DTR's 3+, toes up. foot remains hypersentitive toutouch. 5  /5 in the left deltoid, biceps, triceps, grip, hip flexor, knee extensor, ankle dorsi flexors and plantar flexion Psych: pleasant, irritable ,    Assessment/Plan: 1. Functional deficits secondary to cardio-embolic, left MCA branch infarct with hemorrhagic transformation after TPA  -she has made nice gains in respect to language and right sided strength  -continue with PT/OT efforts to improve coordination, balance, strength, safety 2. Mood/history of memory loss/mild dementia: Aricept 5 mg daily at bedtime -continues to have substantial memory loss  -foresee long term supervision required due its severity    At this point, I don't know that I have much further to add. Recommend neuro follow up and ongoing care per Dr. Pete Glatter. I am certainly available to see her as needed in the future.   Fifteen minutes of face to face patient care time were spent during this visit. All questions were encouraged and answered.

## 2014-07-12 NOTE — Patient Instructions (Signed)
CONTINUE WITH THERAPY TO ADDRESS BALANCE, SAFETY AWARENESS, STRENGTH TRAINING, COORDINATION, ETC.     PT CONTINUES TO REQUIRE 24 HOUR SUPERVISION DUE TO COGNITIVE/MEMORY DEFICITS. I WOULD PROJECT HER TO HAVE A LONG TERM NEED FOR SUPERVISION  MOVING FORWARD.   FOLLOW UP WITH ME AS NEEDED.

## 2014-07-15 DIAGNOSIS — R1312 Dysphagia, oropharyngeal phase: Secondary | ICD-10-CM | POA: Diagnosis not present

## 2014-07-15 DIAGNOSIS — G8190 Hemiplegia, unspecified affecting unspecified side: Secondary | ICD-10-CM | POA: Diagnosis not present

## 2014-07-15 DIAGNOSIS — M6281 Muscle weakness (generalized): Secondary | ICD-10-CM | POA: Diagnosis not present

## 2014-07-15 DIAGNOSIS — R489 Unspecified symbolic dysfunctions: Secondary | ICD-10-CM | POA: Diagnosis not present

## 2014-07-15 DIAGNOSIS — I6012 Nontraumatic subarachnoid hemorrhage from left middle cerebral artery: Secondary | ICD-10-CM | POA: Diagnosis not present

## 2014-07-15 DIAGNOSIS — I69822 Dysarthria following other cerebrovascular disease: Secondary | ICD-10-CM | POA: Diagnosis not present

## 2014-07-16 DIAGNOSIS — I69822 Dysarthria following other cerebrovascular disease: Secondary | ICD-10-CM | POA: Diagnosis not present

## 2014-07-16 DIAGNOSIS — R1312 Dysphagia, oropharyngeal phase: Secondary | ICD-10-CM | POA: Diagnosis not present

## 2014-07-16 DIAGNOSIS — M6281 Muscle weakness (generalized): Secondary | ICD-10-CM | POA: Diagnosis not present

## 2014-07-16 DIAGNOSIS — I6012 Nontraumatic subarachnoid hemorrhage from left middle cerebral artery: Secondary | ICD-10-CM | POA: Diagnosis not present

## 2014-07-16 DIAGNOSIS — G8190 Hemiplegia, unspecified affecting unspecified side: Secondary | ICD-10-CM | POA: Diagnosis not present

## 2014-07-16 DIAGNOSIS — R489 Unspecified symbolic dysfunctions: Secondary | ICD-10-CM | POA: Diagnosis not present

## 2014-07-17 DIAGNOSIS — I69822 Dysarthria following other cerebrovascular disease: Secondary | ICD-10-CM | POA: Diagnosis not present

## 2014-07-17 DIAGNOSIS — R489 Unspecified symbolic dysfunctions: Secondary | ICD-10-CM | POA: Diagnosis not present

## 2014-07-17 DIAGNOSIS — M6281 Muscle weakness (generalized): Secondary | ICD-10-CM | POA: Diagnosis not present

## 2014-07-17 DIAGNOSIS — G8190 Hemiplegia, unspecified affecting unspecified side: Secondary | ICD-10-CM | POA: Diagnosis not present

## 2014-07-17 DIAGNOSIS — I6012 Nontraumatic subarachnoid hemorrhage from left middle cerebral artery: Secondary | ICD-10-CM | POA: Diagnosis not present

## 2014-07-17 DIAGNOSIS — R1312 Dysphagia, oropharyngeal phase: Secondary | ICD-10-CM | POA: Diagnosis not present

## 2014-07-18 DIAGNOSIS — G8190 Hemiplegia, unspecified affecting unspecified side: Secondary | ICD-10-CM | POA: Diagnosis not present

## 2014-07-18 DIAGNOSIS — I69822 Dysarthria following other cerebrovascular disease: Secondary | ICD-10-CM | POA: Diagnosis not present

## 2014-07-18 DIAGNOSIS — M6281 Muscle weakness (generalized): Secondary | ICD-10-CM | POA: Diagnosis not present

## 2014-07-18 DIAGNOSIS — R489 Unspecified symbolic dysfunctions: Secondary | ICD-10-CM | POA: Diagnosis not present

## 2014-07-18 DIAGNOSIS — R1312 Dysphagia, oropharyngeal phase: Secondary | ICD-10-CM | POA: Diagnosis not present

## 2014-07-18 DIAGNOSIS — I6012 Nontraumatic subarachnoid hemorrhage from left middle cerebral artery: Secondary | ICD-10-CM | POA: Diagnosis not present

## 2014-07-19 DIAGNOSIS — I639 Cerebral infarction, unspecified: Secondary | ICD-10-CM | POA: Diagnosis not present

## 2014-07-19 DIAGNOSIS — R1312 Dysphagia, oropharyngeal phase: Secondary | ICD-10-CM | POA: Diagnosis not present

## 2014-07-19 DIAGNOSIS — I69351 Hemiplegia and hemiparesis following cerebral infarction affecting right dominant side: Secondary | ICD-10-CM | POA: Diagnosis not present

## 2014-07-19 DIAGNOSIS — R1310 Dysphagia, unspecified: Secondary | ICD-10-CM | POA: Diagnosis not present

## 2014-07-19 DIAGNOSIS — R489 Unspecified symbolic dysfunctions: Secondary | ICD-10-CM | POA: Diagnosis not present

## 2014-07-19 DIAGNOSIS — I69822 Dysarthria following other cerebrovascular disease: Secondary | ICD-10-CM | POA: Diagnosis not present

## 2014-07-19 DIAGNOSIS — I6012 Nontraumatic subarachnoid hemorrhage from left middle cerebral artery: Secondary | ICD-10-CM | POA: Diagnosis not present

## 2014-07-19 DIAGNOSIS — M6281 Muscle weakness (generalized): Secondary | ICD-10-CM | POA: Diagnosis not present

## 2014-07-19 DIAGNOSIS — I679 Cerebrovascular disease, unspecified: Secondary | ICD-10-CM | POA: Diagnosis not present

## 2014-07-19 DIAGNOSIS — G8190 Hemiplegia, unspecified affecting unspecified side: Secondary | ICD-10-CM | POA: Diagnosis not present

## 2014-07-19 DIAGNOSIS — I69393 Ataxia following cerebral infarction: Secondary | ICD-10-CM | POA: Diagnosis not present

## 2014-07-22 DIAGNOSIS — M6281 Muscle weakness (generalized): Secondary | ICD-10-CM | POA: Diagnosis not present

## 2014-07-22 DIAGNOSIS — I6012 Nontraumatic subarachnoid hemorrhage from left middle cerebral artery: Secondary | ICD-10-CM | POA: Diagnosis not present

## 2014-07-22 DIAGNOSIS — G8190 Hemiplegia, unspecified affecting unspecified side: Secondary | ICD-10-CM | POA: Diagnosis not present

## 2014-07-22 DIAGNOSIS — R1312 Dysphagia, oropharyngeal phase: Secondary | ICD-10-CM | POA: Diagnosis not present

## 2014-07-22 DIAGNOSIS — I69822 Dysarthria following other cerebrovascular disease: Secondary | ICD-10-CM | POA: Diagnosis not present

## 2014-07-22 DIAGNOSIS — R489 Unspecified symbolic dysfunctions: Secondary | ICD-10-CM | POA: Diagnosis not present

## 2014-07-23 DIAGNOSIS — R1312 Dysphagia, oropharyngeal phase: Secondary | ICD-10-CM | POA: Diagnosis not present

## 2014-07-23 DIAGNOSIS — R489 Unspecified symbolic dysfunctions: Secondary | ICD-10-CM | POA: Diagnosis not present

## 2014-07-23 DIAGNOSIS — I69822 Dysarthria following other cerebrovascular disease: Secondary | ICD-10-CM | POA: Diagnosis not present

## 2014-07-23 DIAGNOSIS — I6012 Nontraumatic subarachnoid hemorrhage from left middle cerebral artery: Secondary | ICD-10-CM | POA: Diagnosis not present

## 2014-07-23 DIAGNOSIS — G8190 Hemiplegia, unspecified affecting unspecified side: Secondary | ICD-10-CM | POA: Diagnosis not present

## 2014-07-23 DIAGNOSIS — M6281 Muscle weakness (generalized): Secondary | ICD-10-CM | POA: Diagnosis not present

## 2014-07-24 DIAGNOSIS — R489 Unspecified symbolic dysfunctions: Secondary | ICD-10-CM | POA: Diagnosis not present

## 2014-07-24 DIAGNOSIS — I69822 Dysarthria following other cerebrovascular disease: Secondary | ICD-10-CM | POA: Diagnosis not present

## 2014-07-24 DIAGNOSIS — G8190 Hemiplegia, unspecified affecting unspecified side: Secondary | ICD-10-CM | POA: Diagnosis not present

## 2014-07-24 DIAGNOSIS — M6281 Muscle weakness (generalized): Secondary | ICD-10-CM | POA: Diagnosis not present

## 2014-07-24 DIAGNOSIS — R1312 Dysphagia, oropharyngeal phase: Secondary | ICD-10-CM | POA: Diagnosis not present

## 2014-07-24 DIAGNOSIS — I6012 Nontraumatic subarachnoid hemorrhage from left middle cerebral artery: Secondary | ICD-10-CM | POA: Diagnosis not present

## 2014-07-25 DIAGNOSIS — G8190 Hemiplegia, unspecified affecting unspecified side: Secondary | ICD-10-CM | POA: Diagnosis not present

## 2014-07-25 DIAGNOSIS — R489 Unspecified symbolic dysfunctions: Secondary | ICD-10-CM | POA: Diagnosis not present

## 2014-07-25 DIAGNOSIS — R1312 Dysphagia, oropharyngeal phase: Secondary | ICD-10-CM | POA: Diagnosis not present

## 2014-07-25 DIAGNOSIS — I69822 Dysarthria following other cerebrovascular disease: Secondary | ICD-10-CM | POA: Diagnosis not present

## 2014-07-25 DIAGNOSIS — M6281 Muscle weakness (generalized): Secondary | ICD-10-CM | POA: Diagnosis not present

## 2014-07-25 DIAGNOSIS — I6012 Nontraumatic subarachnoid hemorrhage from left middle cerebral artery: Secondary | ICD-10-CM | POA: Diagnosis not present

## 2014-07-26 DIAGNOSIS — R489 Unspecified symbolic dysfunctions: Secondary | ICD-10-CM | POA: Diagnosis not present

## 2014-07-26 DIAGNOSIS — I69822 Dysarthria following other cerebrovascular disease: Secondary | ICD-10-CM | POA: Diagnosis not present

## 2014-07-26 DIAGNOSIS — R1312 Dysphagia, oropharyngeal phase: Secondary | ICD-10-CM | POA: Diagnosis not present

## 2014-07-26 DIAGNOSIS — G8190 Hemiplegia, unspecified affecting unspecified side: Secondary | ICD-10-CM | POA: Diagnosis not present

## 2014-07-26 DIAGNOSIS — I6012 Nontraumatic subarachnoid hemorrhage from left middle cerebral artery: Secondary | ICD-10-CM | POA: Diagnosis not present

## 2014-07-26 DIAGNOSIS — M6281 Muscle weakness (generalized): Secondary | ICD-10-CM | POA: Diagnosis not present

## 2014-07-29 DIAGNOSIS — R1312 Dysphagia, oropharyngeal phase: Secondary | ICD-10-CM | POA: Diagnosis not present

## 2014-07-29 DIAGNOSIS — G8190 Hemiplegia, unspecified affecting unspecified side: Secondary | ICD-10-CM | POA: Diagnosis not present

## 2014-07-29 DIAGNOSIS — M6281 Muscle weakness (generalized): Secondary | ICD-10-CM | POA: Diagnosis not present

## 2014-07-29 DIAGNOSIS — R489 Unspecified symbolic dysfunctions: Secondary | ICD-10-CM | POA: Diagnosis not present

## 2014-07-29 DIAGNOSIS — I6012 Nontraumatic subarachnoid hemorrhage from left middle cerebral artery: Secondary | ICD-10-CM | POA: Diagnosis not present

## 2014-07-29 DIAGNOSIS — I69822 Dysarthria following other cerebrovascular disease: Secondary | ICD-10-CM | POA: Diagnosis not present

## 2014-07-30 DIAGNOSIS — R489 Unspecified symbolic dysfunctions: Secondary | ICD-10-CM | POA: Diagnosis not present

## 2014-07-30 DIAGNOSIS — M6281 Muscle weakness (generalized): Secondary | ICD-10-CM | POA: Diagnosis not present

## 2014-07-30 DIAGNOSIS — R1312 Dysphagia, oropharyngeal phase: Secondary | ICD-10-CM | POA: Diagnosis not present

## 2014-07-30 DIAGNOSIS — G8190 Hemiplegia, unspecified affecting unspecified side: Secondary | ICD-10-CM | POA: Diagnosis not present

## 2014-07-30 DIAGNOSIS — I6012 Nontraumatic subarachnoid hemorrhage from left middle cerebral artery: Secondary | ICD-10-CM | POA: Diagnosis not present

## 2014-07-30 DIAGNOSIS — I69822 Dysarthria following other cerebrovascular disease: Secondary | ICD-10-CM | POA: Diagnosis not present

## 2014-07-31 DIAGNOSIS — R489 Unspecified symbolic dysfunctions: Secondary | ICD-10-CM | POA: Diagnosis not present

## 2014-07-31 DIAGNOSIS — G8190 Hemiplegia, unspecified affecting unspecified side: Secondary | ICD-10-CM | POA: Diagnosis not present

## 2014-07-31 DIAGNOSIS — I6012 Nontraumatic subarachnoid hemorrhage from left middle cerebral artery: Secondary | ICD-10-CM | POA: Diagnosis not present

## 2014-07-31 DIAGNOSIS — M6281 Muscle weakness (generalized): Secondary | ICD-10-CM | POA: Diagnosis not present

## 2014-07-31 DIAGNOSIS — R1312 Dysphagia, oropharyngeal phase: Secondary | ICD-10-CM | POA: Diagnosis not present

## 2014-07-31 DIAGNOSIS — I69822 Dysarthria following other cerebrovascular disease: Secondary | ICD-10-CM | POA: Diagnosis not present

## 2014-08-01 DIAGNOSIS — G8190 Hemiplegia, unspecified affecting unspecified side: Secondary | ICD-10-CM | POA: Diagnosis not present

## 2014-08-01 DIAGNOSIS — I69822 Dysarthria following other cerebrovascular disease: Secondary | ICD-10-CM | POA: Diagnosis not present

## 2014-08-01 DIAGNOSIS — M6281 Muscle weakness (generalized): Secondary | ICD-10-CM | POA: Diagnosis not present

## 2014-08-01 DIAGNOSIS — R489 Unspecified symbolic dysfunctions: Secondary | ICD-10-CM | POA: Diagnosis not present

## 2014-08-01 DIAGNOSIS — I6012 Nontraumatic subarachnoid hemorrhage from left middle cerebral artery: Secondary | ICD-10-CM | POA: Diagnosis not present

## 2014-08-01 DIAGNOSIS — R1312 Dysphagia, oropharyngeal phase: Secondary | ICD-10-CM | POA: Diagnosis not present

## 2014-08-02 DIAGNOSIS — M6281 Muscle weakness (generalized): Secondary | ICD-10-CM | POA: Diagnosis not present

## 2014-08-02 DIAGNOSIS — I69351 Hemiplegia and hemiparesis following cerebral infarction affecting right dominant side: Secondary | ICD-10-CM | POA: Diagnosis not present

## 2014-08-02 DIAGNOSIS — I69393 Ataxia following cerebral infarction: Secondary | ICD-10-CM | POA: Diagnosis not present

## 2014-08-02 DIAGNOSIS — I6012 Nontraumatic subarachnoid hemorrhage from left middle cerebral artery: Secondary | ICD-10-CM | POA: Diagnosis not present

## 2014-08-05 DIAGNOSIS — I69351 Hemiplegia and hemiparesis following cerebral infarction affecting right dominant side: Secondary | ICD-10-CM | POA: Diagnosis not present

## 2014-08-05 DIAGNOSIS — I69393 Ataxia following cerebral infarction: Secondary | ICD-10-CM | POA: Diagnosis not present

## 2014-08-05 DIAGNOSIS — M6281 Muscle weakness (generalized): Secondary | ICD-10-CM | POA: Diagnosis not present

## 2014-08-05 DIAGNOSIS — I6012 Nontraumatic subarachnoid hemorrhage from left middle cerebral artery: Secondary | ICD-10-CM | POA: Diagnosis not present

## 2014-08-06 DIAGNOSIS — M6281 Muscle weakness (generalized): Secondary | ICD-10-CM | POA: Diagnosis not present

## 2014-08-06 DIAGNOSIS — I69393 Ataxia following cerebral infarction: Secondary | ICD-10-CM | POA: Diagnosis not present

## 2014-08-06 DIAGNOSIS — I6012 Nontraumatic subarachnoid hemorrhage from left middle cerebral artery: Secondary | ICD-10-CM | POA: Diagnosis not present

## 2014-08-06 DIAGNOSIS — I69351 Hemiplegia and hemiparesis following cerebral infarction affecting right dominant side: Secondary | ICD-10-CM | POA: Diagnosis not present

## 2014-08-07 DIAGNOSIS — I69351 Hemiplegia and hemiparesis following cerebral infarction affecting right dominant side: Secondary | ICD-10-CM | POA: Diagnosis not present

## 2014-08-07 DIAGNOSIS — I69393 Ataxia following cerebral infarction: Secondary | ICD-10-CM | POA: Diagnosis not present

## 2014-08-07 DIAGNOSIS — M6281 Muscle weakness (generalized): Secondary | ICD-10-CM | POA: Diagnosis not present

## 2014-08-07 DIAGNOSIS — I6012 Nontraumatic subarachnoid hemorrhage from left middle cerebral artery: Secondary | ICD-10-CM | POA: Diagnosis not present

## 2014-08-08 DIAGNOSIS — I69351 Hemiplegia and hemiparesis following cerebral infarction affecting right dominant side: Secondary | ICD-10-CM | POA: Diagnosis not present

## 2014-08-08 DIAGNOSIS — I6012 Nontraumatic subarachnoid hemorrhage from left middle cerebral artery: Secondary | ICD-10-CM | POA: Diagnosis not present

## 2014-08-08 DIAGNOSIS — M6281 Muscle weakness (generalized): Secondary | ICD-10-CM | POA: Diagnosis not present

## 2014-08-08 DIAGNOSIS — I69393 Ataxia following cerebral infarction: Secondary | ICD-10-CM | POA: Diagnosis not present

## 2014-08-09 DIAGNOSIS — M6281 Muscle weakness (generalized): Secondary | ICD-10-CM | POA: Diagnosis not present

## 2014-08-09 DIAGNOSIS — I6012 Nontraumatic subarachnoid hemorrhage from left middle cerebral artery: Secondary | ICD-10-CM | POA: Diagnosis not present

## 2014-08-09 DIAGNOSIS — I69393 Ataxia following cerebral infarction: Secondary | ICD-10-CM | POA: Diagnosis not present

## 2014-08-09 DIAGNOSIS — I69351 Hemiplegia and hemiparesis following cerebral infarction affecting right dominant side: Secondary | ICD-10-CM | POA: Diagnosis not present

## 2014-08-12 DIAGNOSIS — I69351 Hemiplegia and hemiparesis following cerebral infarction affecting right dominant side: Secondary | ICD-10-CM | POA: Diagnosis not present

## 2014-08-12 DIAGNOSIS — I69393 Ataxia following cerebral infarction: Secondary | ICD-10-CM | POA: Diagnosis not present

## 2014-08-12 DIAGNOSIS — I6012 Nontraumatic subarachnoid hemorrhage from left middle cerebral artery: Secondary | ICD-10-CM | POA: Diagnosis not present

## 2014-08-12 DIAGNOSIS — M6281 Muscle weakness (generalized): Secondary | ICD-10-CM | POA: Diagnosis not present

## 2014-08-13 DIAGNOSIS — M6281 Muscle weakness (generalized): Secondary | ICD-10-CM | POA: Diagnosis not present

## 2014-08-13 DIAGNOSIS — I69393 Ataxia following cerebral infarction: Secondary | ICD-10-CM | POA: Diagnosis not present

## 2014-08-13 DIAGNOSIS — I69351 Hemiplegia and hemiparesis following cerebral infarction affecting right dominant side: Secondary | ICD-10-CM | POA: Diagnosis not present

## 2014-08-13 DIAGNOSIS — I6012 Nontraumatic subarachnoid hemorrhage from left middle cerebral artery: Secondary | ICD-10-CM | POA: Diagnosis not present

## 2014-08-14 DIAGNOSIS — M6281 Muscle weakness (generalized): Secondary | ICD-10-CM | POA: Diagnosis not present

## 2014-08-14 DIAGNOSIS — I69351 Hemiplegia and hemiparesis following cerebral infarction affecting right dominant side: Secondary | ICD-10-CM | POA: Diagnosis not present

## 2014-08-14 DIAGNOSIS — I6012 Nontraumatic subarachnoid hemorrhage from left middle cerebral artery: Secondary | ICD-10-CM | POA: Diagnosis not present

## 2014-08-14 DIAGNOSIS — I69393 Ataxia following cerebral infarction: Secondary | ICD-10-CM | POA: Diagnosis not present

## 2014-08-15 DIAGNOSIS — M6281 Muscle weakness (generalized): Secondary | ICD-10-CM | POA: Diagnosis not present

## 2014-08-15 DIAGNOSIS — I69351 Hemiplegia and hemiparesis following cerebral infarction affecting right dominant side: Secondary | ICD-10-CM | POA: Diagnosis not present

## 2014-08-15 DIAGNOSIS — I6012 Nontraumatic subarachnoid hemorrhage from left middle cerebral artery: Secondary | ICD-10-CM | POA: Diagnosis not present

## 2014-08-15 DIAGNOSIS — I69393 Ataxia following cerebral infarction: Secondary | ICD-10-CM | POA: Diagnosis not present

## 2014-08-16 DIAGNOSIS — M6281 Muscle weakness (generalized): Secondary | ICD-10-CM | POA: Diagnosis not present

## 2014-08-16 DIAGNOSIS — I69351 Hemiplegia and hemiparesis following cerebral infarction affecting right dominant side: Secondary | ICD-10-CM | POA: Diagnosis not present

## 2014-08-16 DIAGNOSIS — I69393 Ataxia following cerebral infarction: Secondary | ICD-10-CM | POA: Diagnosis not present

## 2014-08-16 DIAGNOSIS — I6012 Nontraumatic subarachnoid hemorrhage from left middle cerebral artery: Secondary | ICD-10-CM | POA: Diagnosis not present

## 2014-08-19 DIAGNOSIS — I69393 Ataxia following cerebral infarction: Secondary | ICD-10-CM | POA: Diagnosis not present

## 2014-08-19 DIAGNOSIS — M6281 Muscle weakness (generalized): Secondary | ICD-10-CM | POA: Diagnosis not present

## 2014-08-19 DIAGNOSIS — I6012 Nontraumatic subarachnoid hemorrhage from left middle cerebral artery: Secondary | ICD-10-CM | POA: Diagnosis not present

## 2014-08-19 DIAGNOSIS — I69351 Hemiplegia and hemiparesis following cerebral infarction affecting right dominant side: Secondary | ICD-10-CM | POA: Diagnosis not present

## 2014-08-20 DIAGNOSIS — M6281 Muscle weakness (generalized): Secondary | ICD-10-CM | POA: Diagnosis not present

## 2014-08-20 DIAGNOSIS — I6012 Nontraumatic subarachnoid hemorrhage from left middle cerebral artery: Secondary | ICD-10-CM | POA: Diagnosis not present

## 2014-08-20 DIAGNOSIS — I69393 Ataxia following cerebral infarction: Secondary | ICD-10-CM | POA: Diagnosis not present

## 2014-08-20 DIAGNOSIS — I69351 Hemiplegia and hemiparesis following cerebral infarction affecting right dominant side: Secondary | ICD-10-CM | POA: Diagnosis not present

## 2014-08-21 ENCOUNTER — Ambulatory Visit (INDEPENDENT_AMBULATORY_CARE_PROVIDER_SITE_OTHER): Payer: Medicare Other | Admitting: Neurology

## 2014-08-21 ENCOUNTER — Encounter: Payer: Self-pay | Admitting: Neurology

## 2014-08-21 VITALS — BP 125/67 | HR 60 | Ht <= 58 in | Wt 128.2 lb

## 2014-08-21 DIAGNOSIS — I639 Cerebral infarction, unspecified: Secondary | ICD-10-CM

## 2014-08-21 DIAGNOSIS — I69351 Hemiplegia and hemiparesis following cerebral infarction affecting right dominant side: Secondary | ICD-10-CM | POA: Diagnosis not present

## 2014-08-21 DIAGNOSIS — F0150 Vascular dementia without behavioral disturbance: Secondary | ICD-10-CM

## 2014-08-21 DIAGNOSIS — I6012 Nontraumatic subarachnoid hemorrhage from left middle cerebral artery: Secondary | ICD-10-CM | POA: Diagnosis not present

## 2014-08-21 DIAGNOSIS — I69393 Ataxia following cerebral infarction: Secondary | ICD-10-CM | POA: Diagnosis not present

## 2014-08-21 DIAGNOSIS — M6281 Muscle weakness (generalized): Secondary | ICD-10-CM | POA: Diagnosis not present

## 2014-08-21 MED ORDER — DONEPEZIL HCL 10 MG PO TABS: 10.0000 mg | ORAL_TABLET | Freq: Every day | ORAL | Status: DC

## 2014-08-21 NOTE — Patient Instructions (Addendum)
I had a long discussion the patient, caregiver as well as spoke to nurse Darl Pikes at [facility] facility for the patient as well as with  her son and Dr. Shirlean Kelly and answered questions. She seems to be recovering from her MCA branch infarct with hemorrhagic transformation reasonably well but has significant underlying mixed vascular and senile dementia. She is still has significant cognitive impairment despite being on maximum dose of Namenda XR. but has not reached high enough dose of Aricept.. I recommend increasing Aricept 10 mg daily. Continue physical occupational therapy and encourage her to mobilize using a walker and walk with supervision. She will return for follow-up in 2 months or call earlier if needed

## 2014-08-21 NOTE — Progress Notes (Signed)
[facility] [address] [place]. [place] [address] [phone]       OFFICE FOLLOW-UP NOTE  [name] Date of Birth:  06-Sep-1918 Medical Record Number:  [id number]   HPI: [name] is an 81 y.o. female who resides in [place]. She has a history of Afib on ASA. She was noted to be doing well until 1610 05/08/2014 ([facility]) when she suddenly became confused, had slurred speech, right facial droop and right sided weakness. EMS was called and patient was brought to [facility] health as a code stroke. On arrival she was alert and oriented but continued to show slurred speech, right facial droop and right sided weakness. She was within the window for tPA with no contra indications. Decision was made to give tPA. She does have a history of GI bleed which is why she was taken off of coumadin. She denies any bleeding within the past month.  Modified Rankin: Rankin Score=0. Patient was administered TPA. She was admitted to the neuro ICU for further evaluation and treatment. She unfortunately had a neurological worsening overnight and developed some expressive aphasia and increased right hand weakness. CT scan of the head showed post TPA hemorrhage in the left parietal region with mild cytotoxic edema. Patient was treated conservatively and remained stable and follow-up imaging did not show significant change in the size of the hematoma. She was seen by physical occupational speech therapy and felt to be a good patient for inpatient rehabilitation. Antiplatelets therapy were held. She did well in rehabilitation and eventually was transferred to [facility] for further rehabilitation needs. She was started on aspirin 81 mg given history of atrial fibrillation. She was subsequently also found to have significant cognitive impairment and dementia. She was started on Aricept while in the hospital and after being transferred to [facility] appears that she has been started on Namenda dose  of which has been gradually increased and she is currently on 20 mg a day. Patient is a poor historian and is unable to give detailed history which is obtained from review of nursing home charts as well as telephonic discussion with the nursing supervisor at [facility] as well as telephonic discussion with the patient's son and Dr. Shirlean Kelly. She continues to have significant short-term memory and cognitive difficulties. She has been able to ambulate with a walker but needs close supervision particularly helped to first get up and hold a walker correctly. She needs some help to change her clothes and toilet needs but she can feed herself. She can help for transfers. She can walk independently at the nursing facility.  ROS:   14 system review of systems is positive for  Memory loss, cognitive difficulties, weakness, gait difficulties, hearing loss and all other systems negative  PMH:  Past Medical History  Diagnosis Date  . Pure hypercholesterolemia   . Hyperlipidemia     Tolerating medication well as of 11/27/12  . Hypothyroidism   . Carpal tunnel syndrome   . Benign hypertensive heart disease without heart failure   . Coronary atherosclerosis of native coronary artery   . Spinal stenosis of lumbar region   . Osteopenia   . Postsurgical percutaneous transluminal coronary angioplasty status   . Essential hypertension, benign   . Benign hypertensive kidney disease with chronic kidney disease stage I through stage IV, or unspecified   . Diabetes mellitus with chronic kidney disease   . NSTEMI (non-ST elevated myocardial infarction) 09/21/09    BM stent RCA  . Atrial  fibrillation   . Tachy-brady syndrome     s/p PPM, battery change 08/01/09  . Sinoatrial node dysfunction   . Chronic anemia   . Osteoarthritis   . H/O echocardiogram 07/19/09    EF = 60-65%  . Hyperglycemia     Mild  . Microalbuminuria 02/2010  . Vitamin D deficiency 10/2011  . Chronic renal disease, stage III      Social History:  History   Social History  . Marital Status: Widowed    Spouse Name: N/A  . Number of Children: 3  . Years of Education: N/A   Occupational History  .     Social History Main Topics  . Smoking status: Never Smoker   . Smokeless tobacco: Not on file  . Alcohol Use: Yes     Comment: occasional glass of wine  . Drug Use: No  . Sexual Activity: Not on file   Other Topics Concern  . Not on file   Social History Narrative    Medications:   Current Outpatient Prescriptions on File Prior to Visit  Medication Sig Dispense Refill  . aspirin EC 81 MG tablet Take 81 mg by mouth daily.    . Calcium Carbonate-Vitamin D (CALTRATE 600+D PO) Take 1 tablet by mouth daily.    Marland Kitchen CARTIA XT 120 MG 24 hr capsule Take 120 mg by mouth.   4  . levothyroxine (SYNTHROID) 50 MCG tablet Take 25 mcg by mouth daily before breakfast.    . MULTAQ 400 MG tablet TAKE 1 TABLET BY MOUTH TWICE DAILY 60 tablet 10  . nitroGLYCERIN (NITROSTAT) 0.4 MG SL tablet Place 0.4 mg under the tongue every 5 (five) minutes as needed for chest pain (MAX 3 TABLETS).     . rosuvastatin (CRESTOR) 5 MG tablet Take 5 mg by mouth at bedtime.     Marland Kitchen ZETIA 10 MG tablet TAKE 1 TABLET BY MOUTH ONCE DAILY (Patient taking differently: TAKE 1 TABLET BY MOUTH ONCE DAILY at bedtime.) 30 tablet 9   No current facility-administered medications on file prior to visit.    Allergies:   Allergies  Allergen Reactions  . Other Other (See Comments)    Allergic to crab meat, but no shellfish allergy (can eat lobster, shrimp, etc.)  Crab meat causes angioedema of the throat.  Marland Kitchen Penicillins   . Sulfa Antibiotics     Physical Exam General: Frail elderly Caucasian petite lady, seated, in no evident distress Head: head normocephalic and atraumatic.  Neck: supple with soft bilateral carotid  bruits Cardiovascular: regular rate and rhythm, soft ejection murmur. Musculoskeletal: Mild kyphosis  Skin:  no  rash/petichiae Vascular:  Normal pulses all extremities Filed Vitals:   08/21/14 1420  BP: 125/67  Pulse: 60   Neurologic Exam Mental Status: Awake and fully alert. Oriented to place and person only.. Recent and remote memory poor. Attention span, concentration and fund of knowledge diminished. Mini-Mental status exam scored 12/30 with deficits in orientation, attention, calculation and recall. Animal naming test for only. Clock drawing 1/4 only. Geriatric depression scale 2 only not depressed Mood and affect appropriate.  Cranial Nerves: Fundoscopic exam reveals sharp disc margins. Pupils equal, briskly reactive to light. Extraocular movements full without nystagmus. Visual fields full to confrontation. Hearing intact. Facial sensation intact. Face, tongue, palate moves normally and symmetrically.  Motor: Normal bulk and tone. Normal strength in all tested extremity muscles except mild weakness of the right grip, intrinsic hand muscles and right shoulder 4/5. Diminished fine finger movements  on the right. Orbits left over right upper extremity.. Sensory.: intact to touch ,pinprick .position and vibratory sensation.  Coordination: Rapid alternating movements are diminished on the right. Finger-to-nose and heel-to-shin performed accurately bilaterally. Gait and Station: Arises from chair with slight difficulty. Stance is stooped and broad-based. Gait demonstrates mild initiation apraxia but can walk well with a walker 1 she is started  Reflexes: 1+ and symmetric. Toes downgoing.   NIHSS  2 Modified Rankin  3   ASSESSMENT: 89 year with embolic left MCA branch infarct in April 2016 treated with IV tPA complicated by post TPA hemorrhage and now development of mixed vascular and senile dementia. Stroke risk factors atrial fibrillation as well as severe bilateral extracranial carotid stenosis with string sign, hypertension, hyperlipidemia and diabetes     PLAN: I had a long discussion the  patient, caregiver as well as spoke to nurse [name] at [facility] rehab facility for the patient as well as with  her son and Dr. Shirlean Kelly and answered questions. She seems to be recovering from her MCA branch infarct with hemorrhagic transformation reasonably well but has significant underlying mixed vascular and senile dementia. She is not a candidate for carotid revascularization given her moderate dementia as well as is not an anticoagulation candidate due to recent brain hemorrhage and dementia. Continue aspirin 81 mg daily for stroke prevention and aggressive control of risk factors with blood pressure goal below 130/90, lipids with LDL cholesterol goal below 70 mg percent and diabetes with hemoglobin A1c goal below 6.5%. She is still has significant cognitive impairment despite being on maximum dose of Namenda XR. but has not reached high enough dose of Aricept.. I recommend increasing Aricept 10 mg daily. Continue physical occupational therapy and encourage her to mobilize using a walker and walk with supervision. Greater than 50% of time during this 30 minute visit was spent on counseling and coordination of care. She will return for follow-up in 2 months or call earlier if needed  Delia Heady, MD Note: This document was prepared with digital dictation and possible smart phrase technology. Any transcriptional errors that result from this process are unintentional

## 2014-08-22 DIAGNOSIS — I69393 Ataxia following cerebral infarction: Secondary | ICD-10-CM | POA: Diagnosis not present

## 2014-08-22 DIAGNOSIS — I69351 Hemiplegia and hemiparesis following cerebral infarction affecting right dominant side: Secondary | ICD-10-CM | POA: Diagnosis not present

## 2014-08-22 DIAGNOSIS — I6012 Nontraumatic subarachnoid hemorrhage from left middle cerebral artery: Secondary | ICD-10-CM | POA: Diagnosis not present

## 2014-08-22 DIAGNOSIS — M6281 Muscle weakness (generalized): Secondary | ICD-10-CM | POA: Diagnosis not present

## 2014-08-23 DIAGNOSIS — I69393 Ataxia following cerebral infarction: Secondary | ICD-10-CM | POA: Diagnosis not present

## 2014-08-23 DIAGNOSIS — I69351 Hemiplegia and hemiparesis following cerebral infarction affecting right dominant side: Secondary | ICD-10-CM | POA: Diagnosis not present

## 2014-08-23 DIAGNOSIS — I6012 Nontraumatic subarachnoid hemorrhage from left middle cerebral artery: Secondary | ICD-10-CM | POA: Diagnosis not present

## 2014-08-23 DIAGNOSIS — M6281 Muscle weakness (generalized): Secondary | ICD-10-CM | POA: Diagnosis not present

## 2014-08-26 DIAGNOSIS — I69393 Ataxia following cerebral infarction: Secondary | ICD-10-CM | POA: Diagnosis not present

## 2014-08-26 DIAGNOSIS — I6012 Nontraumatic subarachnoid hemorrhage from left middle cerebral artery: Secondary | ICD-10-CM | POA: Diagnosis not present

## 2014-08-26 DIAGNOSIS — M6281 Muscle weakness (generalized): Secondary | ICD-10-CM | POA: Diagnosis not present

## 2014-08-26 DIAGNOSIS — I69351 Hemiplegia and hemiparesis following cerebral infarction affecting right dominant side: Secondary | ICD-10-CM | POA: Diagnosis not present

## 2014-08-27 DIAGNOSIS — M6281 Muscle weakness (generalized): Secondary | ICD-10-CM | POA: Diagnosis not present

## 2014-08-27 DIAGNOSIS — I69393 Ataxia following cerebral infarction: Secondary | ICD-10-CM | POA: Diagnosis not present

## 2014-08-27 DIAGNOSIS — I69351 Hemiplegia and hemiparesis following cerebral infarction affecting right dominant side: Secondary | ICD-10-CM | POA: Diagnosis not present

## 2014-08-27 DIAGNOSIS — I6012 Nontraumatic subarachnoid hemorrhage from left middle cerebral artery: Secondary | ICD-10-CM | POA: Diagnosis not present

## 2014-08-28 DIAGNOSIS — I69393 Ataxia following cerebral infarction: Secondary | ICD-10-CM | POA: Diagnosis not present

## 2014-08-28 DIAGNOSIS — I69351 Hemiplegia and hemiparesis following cerebral infarction affecting right dominant side: Secondary | ICD-10-CM | POA: Diagnosis not present

## 2014-08-28 DIAGNOSIS — M6281 Muscle weakness (generalized): Secondary | ICD-10-CM | POA: Diagnosis not present

## 2014-08-28 DIAGNOSIS — I6012 Nontraumatic subarachnoid hemorrhage from left middle cerebral artery: Secondary | ICD-10-CM | POA: Diagnosis not present

## 2014-08-29 DIAGNOSIS — I6012 Nontraumatic subarachnoid hemorrhage from left middle cerebral artery: Secondary | ICD-10-CM | POA: Diagnosis not present

## 2014-08-29 DIAGNOSIS — M6281 Muscle weakness (generalized): Secondary | ICD-10-CM | POA: Diagnosis not present

## 2014-08-29 DIAGNOSIS — I69351 Hemiplegia and hemiparesis following cerebral infarction affecting right dominant side: Secondary | ICD-10-CM | POA: Diagnosis not present

## 2014-08-29 DIAGNOSIS — I69393 Ataxia following cerebral infarction: Secondary | ICD-10-CM | POA: Diagnosis not present

## 2014-08-30 DIAGNOSIS — I69351 Hemiplegia and hemiparesis following cerebral infarction affecting right dominant side: Secondary | ICD-10-CM | POA: Diagnosis not present

## 2014-08-30 DIAGNOSIS — I6012 Nontraumatic subarachnoid hemorrhage from left middle cerebral artery: Secondary | ICD-10-CM | POA: Diagnosis not present

## 2014-08-30 DIAGNOSIS — I69393 Ataxia following cerebral infarction: Secondary | ICD-10-CM | POA: Diagnosis not present

## 2014-08-30 DIAGNOSIS — M6281 Muscle weakness (generalized): Secondary | ICD-10-CM | POA: Diagnosis not present

## 2014-09-02 DIAGNOSIS — I69351 Hemiplegia and hemiparesis following cerebral infarction affecting right dominant side: Secondary | ICD-10-CM | POA: Diagnosis not present

## 2014-09-02 DIAGNOSIS — I6012 Nontraumatic subarachnoid hemorrhage from left middle cerebral artery: Secondary | ICD-10-CM | POA: Diagnosis not present

## 2014-09-02 DIAGNOSIS — M6281 Muscle weakness (generalized): Secondary | ICD-10-CM | POA: Diagnosis not present

## 2014-09-02 DIAGNOSIS — I69393 Ataxia following cerebral infarction: Secondary | ICD-10-CM | POA: Diagnosis not present

## 2014-09-03 DIAGNOSIS — M6281 Muscle weakness (generalized): Secondary | ICD-10-CM | POA: Diagnosis not present

## 2014-09-03 DIAGNOSIS — I69393 Ataxia following cerebral infarction: Secondary | ICD-10-CM | POA: Diagnosis not present

## 2014-09-03 DIAGNOSIS — I6012 Nontraumatic subarachnoid hemorrhage from left middle cerebral artery: Secondary | ICD-10-CM | POA: Diagnosis not present

## 2014-09-03 DIAGNOSIS — I69351 Hemiplegia and hemiparesis following cerebral infarction affecting right dominant side: Secondary | ICD-10-CM | POA: Diagnosis not present

## 2014-09-04 DIAGNOSIS — I69393 Ataxia following cerebral infarction: Secondary | ICD-10-CM | POA: Diagnosis not present

## 2014-09-04 DIAGNOSIS — I6012 Nontraumatic subarachnoid hemorrhage from left middle cerebral artery: Secondary | ICD-10-CM | POA: Diagnosis not present

## 2014-09-04 DIAGNOSIS — I69351 Hemiplegia and hemiparesis following cerebral infarction affecting right dominant side: Secondary | ICD-10-CM | POA: Diagnosis not present

## 2014-09-04 DIAGNOSIS — M6281 Muscle weakness (generalized): Secondary | ICD-10-CM | POA: Diagnosis not present

## 2014-09-05 DIAGNOSIS — I6012 Nontraumatic subarachnoid hemorrhage from left middle cerebral artery: Secondary | ICD-10-CM | POA: Diagnosis not present

## 2014-09-05 DIAGNOSIS — M6281 Muscle weakness (generalized): Secondary | ICD-10-CM | POA: Diagnosis not present

## 2014-09-05 DIAGNOSIS — I69393 Ataxia following cerebral infarction: Secondary | ICD-10-CM | POA: Diagnosis not present

## 2014-09-05 DIAGNOSIS — I69351 Hemiplegia and hemiparesis following cerebral infarction affecting right dominant side: Secondary | ICD-10-CM | POA: Diagnosis not present

## 2014-09-06 DIAGNOSIS — I6012 Nontraumatic subarachnoid hemorrhage from left middle cerebral artery: Secondary | ICD-10-CM | POA: Diagnosis not present

## 2014-09-06 DIAGNOSIS — M6281 Muscle weakness (generalized): Secondary | ICD-10-CM | POA: Diagnosis not present

## 2014-09-06 DIAGNOSIS — I69393 Ataxia following cerebral infarction: Secondary | ICD-10-CM | POA: Diagnosis not present

## 2014-09-06 DIAGNOSIS — I69351 Hemiplegia and hemiparesis following cerebral infarction affecting right dominant side: Secondary | ICD-10-CM | POA: Diagnosis not present

## 2014-09-09 DIAGNOSIS — I6012 Nontraumatic subarachnoid hemorrhage from left middle cerebral artery: Secondary | ICD-10-CM | POA: Diagnosis not present

## 2014-09-09 DIAGNOSIS — M6281 Muscle weakness (generalized): Secondary | ICD-10-CM | POA: Diagnosis not present

## 2014-09-09 DIAGNOSIS — I69351 Hemiplegia and hemiparesis following cerebral infarction affecting right dominant side: Secondary | ICD-10-CM | POA: Diagnosis not present

## 2014-09-09 DIAGNOSIS — I69393 Ataxia following cerebral infarction: Secondary | ICD-10-CM | POA: Diagnosis not present

## 2014-09-10 DIAGNOSIS — I69393 Ataxia following cerebral infarction: Secondary | ICD-10-CM | POA: Diagnosis not present

## 2014-09-10 DIAGNOSIS — I69351 Hemiplegia and hemiparesis following cerebral infarction affecting right dominant side: Secondary | ICD-10-CM | POA: Diagnosis not present

## 2014-09-10 DIAGNOSIS — M6281 Muscle weakness (generalized): Secondary | ICD-10-CM | POA: Diagnosis not present

## 2014-09-10 DIAGNOSIS — I6012 Nontraumatic subarachnoid hemorrhage from left middle cerebral artery: Secondary | ICD-10-CM | POA: Diagnosis not present

## 2014-09-11 DIAGNOSIS — I69351 Hemiplegia and hemiparesis following cerebral infarction affecting right dominant side: Secondary | ICD-10-CM | POA: Diagnosis not present

## 2014-09-11 DIAGNOSIS — I6012 Nontraumatic subarachnoid hemorrhage from left middle cerebral artery: Secondary | ICD-10-CM | POA: Diagnosis not present

## 2014-09-11 DIAGNOSIS — M6281 Muscle weakness (generalized): Secondary | ICD-10-CM | POA: Diagnosis not present

## 2014-09-11 DIAGNOSIS — I69393 Ataxia following cerebral infarction: Secondary | ICD-10-CM | POA: Diagnosis not present

## 2014-09-12 DIAGNOSIS — I69351 Hemiplegia and hemiparesis following cerebral infarction affecting right dominant side: Secondary | ICD-10-CM | POA: Diagnosis not present

## 2014-09-12 DIAGNOSIS — I69393 Ataxia following cerebral infarction: Secondary | ICD-10-CM | POA: Diagnosis not present

## 2014-09-12 DIAGNOSIS — I6012 Nontraumatic subarachnoid hemorrhage from left middle cerebral artery: Secondary | ICD-10-CM | POA: Diagnosis not present

## 2014-09-12 DIAGNOSIS — M6281 Muscle weakness (generalized): Secondary | ICD-10-CM | POA: Diagnosis not present

## 2014-09-13 DIAGNOSIS — I69351 Hemiplegia and hemiparesis following cerebral infarction affecting right dominant side: Secondary | ICD-10-CM | POA: Diagnosis not present

## 2014-09-13 DIAGNOSIS — I69393 Ataxia following cerebral infarction: Secondary | ICD-10-CM | POA: Diagnosis not present

## 2014-09-13 DIAGNOSIS — M6281 Muscle weakness (generalized): Secondary | ICD-10-CM | POA: Diagnosis not present

## 2014-09-13 DIAGNOSIS — I6012 Nontraumatic subarachnoid hemorrhage from left middle cerebral artery: Secondary | ICD-10-CM | POA: Diagnosis not present

## 2014-09-16 DIAGNOSIS — I69351 Hemiplegia and hemiparesis following cerebral infarction affecting right dominant side: Secondary | ICD-10-CM | POA: Diagnosis not present

## 2014-09-16 DIAGNOSIS — I69393 Ataxia following cerebral infarction: Secondary | ICD-10-CM | POA: Diagnosis not present

## 2014-09-16 DIAGNOSIS — I6012 Nontraumatic subarachnoid hemorrhage from left middle cerebral artery: Secondary | ICD-10-CM | POA: Diagnosis not present

## 2014-09-16 DIAGNOSIS — M6281 Muscle weakness (generalized): Secondary | ICD-10-CM | POA: Diagnosis not present

## 2014-09-17 DIAGNOSIS — M6281 Muscle weakness (generalized): Secondary | ICD-10-CM | POA: Diagnosis not present

## 2014-09-17 DIAGNOSIS — I6012 Nontraumatic subarachnoid hemorrhage from left middle cerebral artery: Secondary | ICD-10-CM | POA: Diagnosis not present

## 2014-09-17 DIAGNOSIS — I69351 Hemiplegia and hemiparesis following cerebral infarction affecting right dominant side: Secondary | ICD-10-CM | POA: Diagnosis not present

## 2014-09-17 DIAGNOSIS — I69393 Ataxia following cerebral infarction: Secondary | ICD-10-CM | POA: Diagnosis not present

## 2014-09-18 DIAGNOSIS — M6281 Muscle weakness (generalized): Secondary | ICD-10-CM | POA: Diagnosis not present

## 2014-09-18 DIAGNOSIS — I69393 Ataxia following cerebral infarction: Secondary | ICD-10-CM | POA: Diagnosis not present

## 2014-09-18 DIAGNOSIS — I69351 Hemiplegia and hemiparesis following cerebral infarction affecting right dominant side: Secondary | ICD-10-CM | POA: Diagnosis not present

## 2014-09-18 DIAGNOSIS — I6012 Nontraumatic subarachnoid hemorrhage from left middle cerebral artery: Secondary | ICD-10-CM | POA: Diagnosis not present

## 2014-09-19 DIAGNOSIS — I63512 Cerebral infarction due to unspecified occlusion or stenosis of left middle cerebral artery: Secondary | ICD-10-CM | POA: Diagnosis not present

## 2014-09-19 DIAGNOSIS — I69393 Ataxia following cerebral infarction: Secondary | ICD-10-CM | POA: Diagnosis not present

## 2014-09-19 DIAGNOSIS — M6281 Muscle weakness (generalized): Secondary | ICD-10-CM | POA: Diagnosis not present

## 2014-09-19 DIAGNOSIS — I69351 Hemiplegia and hemiparesis following cerebral infarction affecting right dominant side: Secondary | ICD-10-CM | POA: Diagnosis not present

## 2014-09-19 DIAGNOSIS — F0151 Vascular dementia with behavioral disturbance: Secondary | ICD-10-CM | POA: Diagnosis not present

## 2014-09-19 DIAGNOSIS — I6012 Nontraumatic subarachnoid hemorrhage from left middle cerebral artery: Secondary | ICD-10-CM | POA: Diagnosis not present

## 2014-09-19 DIAGNOSIS — R488 Other symbolic dysfunctions: Secondary | ICD-10-CM | POA: Diagnosis not present

## 2014-09-20 ENCOUNTER — Non-Acute Institutional Stay (SKILLED_NURSING_FACILITY): Payer: Medicare Other | Admitting: Adult Health

## 2014-09-20 ENCOUNTER — Encounter: Payer: Self-pay | Admitting: Adult Health

## 2014-09-20 DIAGNOSIS — M6281 Muscle weakness (generalized): Secondary | ICD-10-CM | POA: Diagnosis not present

## 2014-09-20 DIAGNOSIS — R488 Other symbolic dysfunctions: Secondary | ICD-10-CM | POA: Diagnosis not present

## 2014-09-20 DIAGNOSIS — I6012 Nontraumatic subarachnoid hemorrhage from left middle cerebral artery: Secondary | ICD-10-CM | POA: Diagnosis not present

## 2014-09-20 DIAGNOSIS — G8190 Hemiplegia, unspecified affecting unspecified side: Secondary | ICD-10-CM | POA: Diagnosis not present

## 2014-09-20 DIAGNOSIS — I63512 Cerebral infarction due to unspecified occlusion or stenosis of left middle cerebral artery: Secondary | ICD-10-CM | POA: Diagnosis not present

## 2014-09-20 DIAGNOSIS — I63412 Cerebral infarction due to embolism of left middle cerebral artery: Secondary | ICD-10-CM | POA: Diagnosis not present

## 2014-09-20 DIAGNOSIS — I69351 Hemiplegia and hemiparesis following cerebral infarction affecting right dominant side: Secondary | ICD-10-CM | POA: Insufficient documentation

## 2014-09-20 DIAGNOSIS — I10 Essential (primary) hypertension: Secondary | ICD-10-CM

## 2014-09-20 DIAGNOSIS — I480 Paroxysmal atrial fibrillation: Secondary | ICD-10-CM

## 2014-09-20 DIAGNOSIS — I69851 Hemiplegia and hemiparesis following other cerebrovascular disease affecting right dominant side: Secondary | ICD-10-CM

## 2014-09-20 DIAGNOSIS — E7800 Pure hypercholesterolemia, unspecified: Secondary | ICD-10-CM

## 2014-09-20 DIAGNOSIS — F0150 Vascular dementia without behavioral disturbance: Secondary | ICD-10-CM

## 2014-09-20 DIAGNOSIS — F0151 Vascular dementia with behavioral disturbance: Secondary | ICD-10-CM | POA: Diagnosis not present

## 2014-09-20 DIAGNOSIS — E039 Hypothyroidism, unspecified: Secondary | ICD-10-CM | POA: Diagnosis not present

## 2014-09-20 DIAGNOSIS — G8191 Hemiplegia, unspecified affecting right dominant side: Secondary | ICD-10-CM

## 2014-09-20 NOTE — Progress Notes (Addendum)
Patient ID: [name], female   DOB: [birth year], 81 y.o.   MRN: [id number]     Patient Care Team: Merlene Laughter, MD as PCP - General (Internal Medicine) Larina Earthly, MD as Consulting Physician (Vascular Surgery) Ranelle Oyster, MD as Consulting Physician (Physical Medicine and Rehabilitation) Micki Riley, MD as Consulting Physician (Neurology) Hillis Range, MD as Consulting Physician (Cardiology)  Nursing Home Location:  [facility]   Code Status: DNR   Place of Service: SNF (31)  Chief Complaint  Patient presents with  . Medical Management of Chronic Issues    HPI:  81 y.o. female residing at [facility], rehab section. I am here to review her chronic medical issues.  VS have been stable over the past month with a few outlier BP's that were elevated. She is ambulatory with a walker but needs assistance with dressing and other self care issues. She was admitted to the hospital 05/13/14-05/27/14 due to a cardio embolic left MCA branch infarct with hemorrhagic transformation after TPA. She has residual right sided weakness with dysphagia.  She is eating well and the staff has no complaints regarding her care today.  In review of the nurses notes she has some periods of yelling in her sleep and can be disagreeable at times but can be redirected.  She has gained 6 lbs since June but there are no signs of edema or SOB.  She was seen by Neurology with a recommendation to increase the Aricept to 10 mg on 08/21/14.   She has mixed dementia per the neuro notes.  Her MMSE in May of 2016 was 15/30 with a failed clock.   She has been released from speech and is on a regular diet with thin liquids, food cut to bite size pieces. She is discharged from PT but continues to work with OT regarding self care and e stim.     Review of Systems:  Review of Systems  Constitutional: Negative for fever, activity change, appetite change and fatigue.  HENT:  Negative for congestion.   Respiratory: Negative for cough and shortness of breath.   Cardiovascular: Negative for chest pain and leg swelling.  Gastrointestinal: Negative for abdominal pain and abdominal distention.  Genitourinary: Negative for dysuria.  Skin: Negative for rash and wound.  Neurological: Positive for facial asymmetry, speech difficulty and weakness. Negative for tremors and syncope.  Psychiatric/Behavioral: Positive for confusion. Negative for behavioral problems and agitation.    Medications: Patient's Medications  New Prescriptions   No medications on file  Previous Medications   ASPIRIN EC 81 MG TABLET    Take 81 mg by mouth daily.   CALCIUM CARBONATE-VITAMIN D (CALTRATE 600+D PO)    Take 1 tablet by mouth daily.   CARTIA XT 120 MG 24 HR CAPSULE    Take 120 mg by mouth.    DONEPEZIL (ARICEPT) 10 MG TABLET    Take 1 tablet (10 mg total) by mouth at bedtime.   LEVOTHYROXINE (SYNTHROID) 50 MCG TABLET    Take 25 mcg by mouth daily before breakfast.   MULTAQ 400 MG TABLET    TAKE 1 TABLET BY MOUTH TWICE DAILY   NAMENDA XR 7 MG CP24 24 HR CAPSULE       NITROGLYCERIN (NITROSTAT) 0.4 MG SL TABLET    Place 0.4 mg under the tongue every 5 (five) minutes as needed for chest pain (MAX 3 TABLETS).    ROSUVASTATIN (CRESTOR) 5 MG TABLET    Take 5 mg  by mouth at bedtime.    ZETIA 10 MG TABLET    TAKE 1 TABLET BY MOUTH ONCE DAILY  Modified Medications   No medications on file  Discontinued Medications   No medications on file     Physical Exam:  Filed Vitals:   09/20/14 1003  BP: 147/72  Pulse: 74  Temp: 97.6 F (36.4 C)  Resp: 18  Weight: 126 lb 6.4 oz (57.335 kg)  SpO2: 99%   Wt Readings from Last 3 Encounters:  09/20/14 126 lb 6.4 oz (57.335 kg)  08/21/14 128 lb 3.2 oz (58.151 kg)  07/01/14 120 lb (54.432 kg)    Physical Exam  Constitutional: No distress.  frail  Neck: No JVD present.  Cardiovascular: Normal rate and normal heart sounds.   Irregular, no  edema  Pulmonary/Chest: Effort normal and breath sounds normal. No respiratory distress.  Abdominal: Soft. Bowel sounds are normal. She exhibits no distension. There is no tenderness.  Musculoskeletal: She exhibits no edema or tenderness.  Neurological: She is alert. A cranial nerve deficit is present.  Right facial droop. Oriented x 2. Able to f/c.    Skin: Skin is warm and dry. She is not diaphoretic.  Psychiatric:  Some what sad during our visit, questions why she is still in the hospital     Labs reviewed/Significant Diagnostic Results:  Basic Metabolic Panel:  Recent Labs  40/98/11 0703 05/26/14 0645 05/27/14 0531  NA 138 136 138  K 3.9 3.9 4.1  CL 107 107 109  CO2 22 18* 21*  GLUCOSE 103* 118* 122*  BUN 18 14 17   CREATININE 1.22* 1.18* 1.30*  CALCIUM 8.9 9.1 8.9   Liver Function Tests:  Recent Labs  05/08/14 1652 05/14/14 1420  AST 38* 23  ALT 29 20  ALKPHOS 63 64  BILITOT 0.6 0.4  PROT 7.1 7.0  ALBUMIN 3.6 3.1*   No results for input(s): LIPASE, AMYLASE in the last 8760 hours. No results for input(s): AMMONIA in the last 8760 hours. CBC:  Recent Labs  05/08/14 1652  05/13/14 0235 05/14/14 1420 05/17/14 0733  WBC 11.6*  < > 12.0* 10.6* 8.2  NEUTROABS 7.6  --  8.7* 7.5  --   HGB 14.9  < > 14.1 14.6 14.4  HCT 44.1  < > 42.1 43.2 43.2  MCV 91.5  < > 91.1 90.8 90.4  PLT 221  < > 229 273 293  < > = values in this interval not displayed. CBG:  Recent Labs  05/25/14 2044 05/26/14 0612 05/27/14 1150  GLUCAP 130* 107* 124*   TSH: No results for input(s): TSH in the last 8760 hours. A1C: Lab Results  Component Value Date   HGBA1C 7.5* 05/09/2014   Lipid Panel:  Recent Labs  05/09/14 0333  CHOL 137  HDL 47  LDLCALC 74  TRIG 79  CHOLHDL 2.9       Assessment/Plan  1. Paroxysmal atrial fibrillation -rate controlled with Multaq and Cardizem -needs lfts while on Multaq and statin -not on anticoagulation due to h/o GIB and cerebral  hemorrhage, continue ASA  2. Essential hypertension, benign -controlled, check BUN/Cr  3. Cerebral infarction due to embolism of left middle cerebral artery -continues on asa with no new events  4. Vascular dementia, without behavioral disturbance -stable function while on namenda and aricept -if staff reports continued dreams that are disturbing to her would consider d/cing aricept  5. Hemiparesis affecting right side as late effect of stroke -improved strength to the right  side, continue OT  6. Pure hypercholesterolemia -check lipids  7. Hypothyroidism, unspecified hypothyroidism type -check TSH   [name], ANP [facility] [phone]

## 2014-09-23 DIAGNOSIS — F0151 Vascular dementia with behavioral disturbance: Secondary | ICD-10-CM | POA: Diagnosis not present

## 2014-09-23 DIAGNOSIS — I6012 Nontraumatic subarachnoid hemorrhage from left middle cerebral artery: Secondary | ICD-10-CM | POA: Diagnosis not present

## 2014-09-23 DIAGNOSIS — I69351 Hemiplegia and hemiparesis following cerebral infarction affecting right dominant side: Secondary | ICD-10-CM | POA: Diagnosis not present

## 2014-09-23 DIAGNOSIS — R488 Other symbolic dysfunctions: Secondary | ICD-10-CM | POA: Diagnosis not present

## 2014-09-23 DIAGNOSIS — I63512 Cerebral infarction due to unspecified occlusion or stenosis of left middle cerebral artery: Secondary | ICD-10-CM | POA: Diagnosis not present

## 2014-09-23 DIAGNOSIS — M6281 Muscle weakness (generalized): Secondary | ICD-10-CM | POA: Diagnosis not present

## 2014-09-24 ENCOUNTER — Non-Acute Institutional Stay (SKILLED_NURSING_FACILITY): Payer: Medicare Other | Admitting: Internal Medicine

## 2014-09-24 DIAGNOSIS — I69351 Hemiplegia and hemiparesis following cerebral infarction affecting right dominant side: Secondary | ICD-10-CM | POA: Diagnosis not present

## 2014-09-24 DIAGNOSIS — R739 Hyperglycemia, unspecified: Secondary | ICD-10-CM

## 2014-09-24 DIAGNOSIS — R488 Other symbolic dysfunctions: Secondary | ICD-10-CM | POA: Diagnosis not present

## 2014-09-24 DIAGNOSIS — M6281 Muscle weakness (generalized): Secondary | ICD-10-CM | POA: Diagnosis not present

## 2014-09-24 DIAGNOSIS — E780 Pure hypercholesterolemia: Secondary | ICD-10-CM | POA: Diagnosis not present

## 2014-09-24 DIAGNOSIS — F01518 Vascular dementia, unspecified severity, with other behavioral disturbance: Secondary | ICD-10-CM

## 2014-09-24 DIAGNOSIS — D72829 Elevated white blood cell count, unspecified: Secondary | ICD-10-CM

## 2014-09-24 DIAGNOSIS — E039 Hypothyroidism, unspecified: Secondary | ICD-10-CM | POA: Diagnosis not present

## 2014-09-24 DIAGNOSIS — F0151 Vascular dementia with behavioral disturbance: Secondary | ICD-10-CM | POA: Diagnosis not present

## 2014-09-24 DIAGNOSIS — I10 Essential (primary) hypertension: Secondary | ICD-10-CM | POA: Diagnosis not present

## 2014-09-24 DIAGNOSIS — I63512 Cerebral infarction due to unspecified occlusion or stenosis of left middle cerebral artery: Secondary | ICD-10-CM | POA: Diagnosis not present

## 2014-09-24 DIAGNOSIS — F515 Nightmare disorder: Secondary | ICD-10-CM

## 2014-09-24 DIAGNOSIS — I6012 Nontraumatic subarachnoid hemorrhage from left middle cerebral artery: Secondary | ICD-10-CM | POA: Diagnosis not present

## 2014-09-24 LAB — TSH: TSH: 4 u[IU]/mL (ref 0.41–5.90)

## 2014-09-24 LAB — LIPID PANEL
Cholesterol: 158 mg/dL (ref 0–200)
HDL: 67 mg/dL (ref 35–70)
LDL CALC: 67 mg/dL
TRIGLYCERIDES: 122 mg/dL (ref 40–160)

## 2014-09-24 LAB — CBC AND DIFFERENTIAL
HEMATOCRIT: 45 % (ref 36–46)
Hemoglobin: 14.8 g/dL (ref 12.0–16.0)
Platelets: 272 10*3/uL (ref 150–399)
WBC: 11.8 10^3/mL

## 2014-09-24 LAB — BASIC METABOLIC PANEL
BUN: 22 mg/dL — AB (ref 4–21)
CREATININE: 1.3 mg/dL — AB (ref 0.5–1.1)
GLUCOSE: 201 mg/dL
Potassium: 4.5 mmol/L (ref 3.4–5.3)
SODIUM: 139 mmol/L (ref 137–147)

## 2014-09-24 NOTE — Progress Notes (Signed)
Patient ID: [name], female   DOB: 01-Feb-1918, 81 y.o.   MRN: [id number]  Location:  [facility] Provider:  Gwenith Spitz. Renato Gails, D.O., C.M.D.  Code Status:  DNR Goals of care: Advanced Directive information Does patient have an advance directive?: Yes, Type of Advance Directive: Healthcare Power of [name];Living will;Out of facility DNR (pink MOST or yellow form), Pre-existing out of facility DNR order (yellow form or pink MOST form): Yellow form placed in chart (order not valid for inpatient use), Does patient want to make changes to advanced directive?: No - Patient declined  Chief Complaint  Patient presents with  . Acute Visit    agitation and anger, shouting the full shift; has not had signs of infection; gets upset if she does not get immediate help from staff    HPI:  81 yo white female with h/o dementia with behaviors, recent stroke with now only right upper extremity weakness, htn, hyperlipidemia, hypothyroid, cad, afib and lumbar spinal stenosis was seen acutely die to agitation and anger with staff.  Rehab nurse notes this has been getting worse and worse.  She had a urine dipstick done which was negative.  She has been resisting care as well, getting very angry and shouting.  She had initially been doing better on days she got her baths b/c she wanted them more often.  A full panel of labs was ordered which showed leukocytosis, but was otherwise unremarkable.  She also has been having nightmares that seemed to correlate with the time her aricept was upped from 5 to 10mg , but she does not remember that she has them.  When seen, pt had no recollection of any of her behaviors.  She was very pleasant.  She even laughed and joked.  She also claimed no one asked her how she was doing which clearly is false.   Review of Systems:  Review of Systems  Constitutional: Negative for fever, chills and malaise/fatigue.  HENT: Negative for congestion.   Eyes: Negative for blurred  vision.  Respiratory: Negative for shortness of breath.   Cardiovascular: Negative for chest pain.  Gastrointestinal: Negative for abdominal pain.  Genitourinary: Positive for urgency and frequency. Negative for dysuria.       Incontinence  Musculoskeletal: Negative for falls.  Skin: Negative for rash.  Neurological: Positive for focal weakness. Negative for dizziness, loss of consciousness and weakness.       Right arm  Psychiatric/Behavioral: Positive for memory loss.       Nightmares    Past Medical History  Diagnosis Date  . Pure hypercholesterolemia   . Hyperlipidemia     Tolerating medication well as of 11/27/12  . Hypothyroidism   . Carpal tunnel syndrome   . Benign hypertensive heart disease without heart failure   . Coronary atherosclerosis of native coronary artery   . Spinal stenosis of lumbar region   . Osteopenia   . Postsurgical percutaneous transluminal coronary angioplasty status   . Essential hypertension, benign   . Benign hypertensive kidney disease with chronic kidney disease stage I through stage IV, or unspecified   . Diabetes mellitus with chronic kidney disease   . NSTEMI (non-ST elevated myocardial infarction) 09/21/09    BM stent RCA  . Atrial fibrillation   . Tachy-brady syndrome     s/p PPM, battery change 08/01/09  . Sinoatrial node dysfunction   . Chronic anemia   . Osteoarthritis   . H/O echocardiogram 07/19/09    EF = 60-65%  . Hyperglycemia  Mild  . Microalbuminuria 02/2010  . Vitamin D deficiency 10/2011  . Chronic renal disease, stage III     Patient Active Problem List   Diagnosis Date Noted  . Vascular dementia 07/01/2014  . Left middle cerebral artery stroke 05/13/2014  . Right hemiparesis 05/13/2014  . Cerebral hemorrhage   . Received intravenous tissue plasminogen activator (tPA) in emergency department   . Cytotoxic brain edema   . CVA (cerebral infarction) 05/08/2014  . Tachy-brady syndrome   . Encounter for long-term  (current) use of other medications   . CAD in native artery   . Atrial fibrillation   . Hypothyroidism   . Cardiac pacemaker in situ   . Essential hypertension, benign   . Pure hypercholesterolemia   . Sinoatrial node dysfunction     Allergies  Allergen Reactions  . Other Other (See Comments)    Allergic to crab meat, but no shellfish allergy (can eat lobster, shrimp, etc.)  Crab meat causes angioedema of the throat.  Marland Kitchen Penicillins   . Sulfa Antibiotics     Medications: Patient's Medications  New Prescriptions   No medications on file  Previous Medications   ASPIRIN EC 81 MG TABLET    Take 81 mg by mouth daily.   CALCIUM CARBONATE-VITAMIN D (CALTRATE 600+D PO)    Take 1 tablet by mouth daily.   CARTIA XT 120 MG 24 HR CAPSULE    Take 120 mg by mouth.    DONEPEZIL (ARICEPT) 10 MG TABLET    Take 1 tablet (10 mg total) by mouth at bedtime.   LEVOTHYROXINE (SYNTHROID) 50 MCG TABLET    Take 25 mcg by mouth daily before breakfast.   MULTAQ 400 MG TABLET    TAKE 1 TABLET BY MOUTH TWICE DAILY   NAMENDA XR 7 MG CP24 24 HR CAPSULE       NITROGLYCERIN (NITROSTAT) 0.4 MG SL TABLET    Place 0.4 mg under the tongue every 5 (five) minutes as needed for chest pain (MAX 3 TABLETS).    ROSUVASTATIN (CRESTOR) 5 MG TABLET    Take 5 mg by mouth at bedtime.    ZETIA 10 MG TABLET    TAKE 1 TABLET BY MOUTH ONCE DAILY  Modified Medications   No medications on file  Discontinued Medications   No medications on file    Physical Exam: Filed Vitals:   09/24/14 0949  BP: 148/68  Pulse: 86  Temp: 97.8 F (36.6 C)  Resp: 20  Height: 4\' 8"  (1.422 m)  Weight: 125 lb 12.8 oz (57.063 kg)  SpO2: 98%   Body mass index is 28.22 kg/(m^2).  Physical Exam  Constitutional: She appears well-developed and well-nourished. No distress.  Cardiovascular: Intact distal pulses.   irreg irreg  Pulmonary/Chest: Effort normal and breath sounds normal. No respiratory distress. She has no wheezes. She has no rales.    Abdominal: Soft. Bowel sounds are normal. She exhibits no distension. There is no tenderness.  Musculoskeletal:  Right upper extremity 4/5 grip vs. 5/5 gross strength of other extremities  Neurological: She is alert.  Oriented to person and place, not time  Skin: Skin is warm and dry.  Psychiatric: She has a normal mood and affect.  At present    Labs reviewed: Basic Metabolic Panel:  Recent Labs  16/10/96 0703 05/26/14 0645 05/27/14 0531 09/24/14  NA 138 136 138 139  K 3.9 3.9 4.1 4.5  CL 107 107 109  --   CO2 22 18* 21*  --  GLUCOSE 103* 118* 122*  --   BUN 22*  CREATININE 1.22* 1.18* 1.30* 1.3*  CALCIUM 8.9 9.1 8.9  --     Liver Function Tests:  Recent Labs  05/08/14 1652 05/14/14 1420  AST 38* 23  ALT 29 20  ALKPHOS 63 64  BILITOT 0.6 0.4  PROT 7.1 7.0  ALBUMIN 3.6 3.1*    CBC:  Recent Labs  05/08/14 1652  05/13/14 0235 05/14/14 1420 05/17/14 0733 09/24/14  WBC 11.6*  < > 12.0* 10.6* 8.2 11.8  NEUTROABS 7.6  --  8.7* 7.5  --   --   HGB 14.9  < > 14.1 14.6 14.4 14.8  HCT 44.1  < > 42.1 43.2 43.2 45  MCV 91.5  < > 91.1 90.8 90.4  --   PLT 221  < > 229 273 293 272  < > = values in this interval not displayed.  TSH 2.959 Lab Results  Component Value Date   HGBA1C 7.5* 05/09/2014   Lab Results  Component Value Date   CHOL 158 09/24/2014   HDL 67 09/24/2014   LDLCALC 67 09/24/2014   TRIG 122 09/24/2014   CHOLHDL 2.9 05/09/2014    Patient Care Team: Merlene Laughter, MD as PCP - General (Internal Medicine) Larina Earthly, MD as Consulting Physician (Vascular Surgery) Ranelle Oyster, MD as Consulting Physician (Physical Medicine and Rehabilitation) Micki Riley, MD as Consulting Physician (Neurology) Hillis Range, MD as Consulting Physician (Cardiology)  Assessment/Plan 1. Vascular dementia with behavioral disturbance -pt with increased behavioral problems recently -will reduce her namendaXR down to  to make sure this is not  the culprit -if not improving in 2 wks, would add depakote  po bid and monitor behaviors--these have gotten to the point where she is very upset and staff have tried very hard to calm her and work with her   2. Nightmares -ongoing per staff, but pt does not recall them -if continue, decrease aricept down to  and monitor  3. Leukocytosis -etiology unclear--advised to obtain straight cath UA c+s due to change in behaviors and this -no cough, congestion, noted difficulty swallowing her meals or pills--I watched her drink some water without any difficulty  4. Hyperglycemia -review of old labs showed elevated hba1c of 7.5 in April when she first returned from her stroke hospitalization -reassess hba1c with next labs--if still that high, will need diabetes med  Family/ staff Communication: discussed with rehab nurse and nurse manager  Labs/tests ordered:  Hba1c, I/O UA c+s  Lynelle Weiler L. Mister Krahenbuhl, D.O. Geriatrics [facility] [address], [place] [address] Cell Phone (Mon-Fri 8am-5pm):  [phone] On Call:  [phone] & follow prompts after 5pm & weekends Office Phone:  [phone] Office Fax:  [phone]

## 2014-09-25 DIAGNOSIS — R488 Other symbolic dysfunctions: Secondary | ICD-10-CM | POA: Diagnosis not present

## 2014-09-25 DIAGNOSIS — I63512 Cerebral infarction due to unspecified occlusion or stenosis of left middle cerebral artery: Secondary | ICD-10-CM | POA: Diagnosis not present

## 2014-09-25 DIAGNOSIS — I69351 Hemiplegia and hemiparesis following cerebral infarction affecting right dominant side: Secondary | ICD-10-CM | POA: Diagnosis not present

## 2014-09-25 DIAGNOSIS — M6281 Muscle weakness (generalized): Secondary | ICD-10-CM | POA: Diagnosis not present

## 2014-09-25 DIAGNOSIS — I6012 Nontraumatic subarachnoid hemorrhage from left middle cerebral artery: Secondary | ICD-10-CM | POA: Diagnosis not present

## 2014-09-25 DIAGNOSIS — F0151 Vascular dementia with behavioral disturbance: Secondary | ICD-10-CM | POA: Diagnosis not present

## 2014-09-26 DIAGNOSIS — R488 Other symbolic dysfunctions: Secondary | ICD-10-CM | POA: Diagnosis not present

## 2014-09-26 DIAGNOSIS — M6281 Muscle weakness (generalized): Secondary | ICD-10-CM | POA: Diagnosis not present

## 2014-09-26 DIAGNOSIS — R739 Hyperglycemia, unspecified: Secondary | ICD-10-CM | POA: Diagnosis not present

## 2014-09-26 DIAGNOSIS — F0151 Vascular dementia with behavioral disturbance: Secondary | ICD-10-CM | POA: Diagnosis not present

## 2014-09-26 DIAGNOSIS — I69351 Hemiplegia and hemiparesis following cerebral infarction affecting right dominant side: Secondary | ICD-10-CM | POA: Diagnosis not present

## 2014-09-26 DIAGNOSIS — I6012 Nontraumatic subarachnoid hemorrhage from left middle cerebral artery: Secondary | ICD-10-CM | POA: Diagnosis not present

## 2014-09-26 DIAGNOSIS — I63512 Cerebral infarction due to unspecified occlusion or stenosis of left middle cerebral artery: Secondary | ICD-10-CM | POA: Diagnosis not present

## 2014-09-26 LAB — HEMOGLOBIN A1C: Hgb A1c MFr Bld: 7.5 % — AB (ref 4.0–6.0)

## 2014-09-27 DIAGNOSIS — M6281 Muscle weakness (generalized): Secondary | ICD-10-CM | POA: Diagnosis not present

## 2014-09-27 DIAGNOSIS — I6012 Nontraumatic subarachnoid hemorrhage from left middle cerebral artery: Secondary | ICD-10-CM | POA: Diagnosis not present

## 2014-09-27 DIAGNOSIS — I69351 Hemiplegia and hemiparesis following cerebral infarction affecting right dominant side: Secondary | ICD-10-CM | POA: Diagnosis not present

## 2014-09-27 DIAGNOSIS — F0151 Vascular dementia with behavioral disturbance: Secondary | ICD-10-CM | POA: Diagnosis not present

## 2014-09-27 DIAGNOSIS — R488 Other symbolic dysfunctions: Secondary | ICD-10-CM | POA: Diagnosis not present

## 2014-09-27 DIAGNOSIS — I63512 Cerebral infarction due to unspecified occlusion or stenosis of left middle cerebral artery: Secondary | ICD-10-CM | POA: Diagnosis not present

## 2014-09-29 ENCOUNTER — Encounter: Payer: Self-pay | Admitting: Internal Medicine

## 2014-09-30 DIAGNOSIS — I6012 Nontraumatic subarachnoid hemorrhage from left middle cerebral artery: Secondary | ICD-10-CM | POA: Diagnosis not present

## 2014-09-30 DIAGNOSIS — M6281 Muscle weakness (generalized): Secondary | ICD-10-CM | POA: Diagnosis not present

## 2014-09-30 DIAGNOSIS — I63512 Cerebral infarction due to unspecified occlusion or stenosis of left middle cerebral artery: Secondary | ICD-10-CM | POA: Diagnosis not present

## 2014-09-30 DIAGNOSIS — I69351 Hemiplegia and hemiparesis following cerebral infarction affecting right dominant side: Secondary | ICD-10-CM | POA: Diagnosis not present

## 2014-09-30 DIAGNOSIS — R488 Other symbolic dysfunctions: Secondary | ICD-10-CM | POA: Diagnosis not present

## 2014-09-30 DIAGNOSIS — F0151 Vascular dementia with behavioral disturbance: Secondary | ICD-10-CM | POA: Diagnosis not present

## 2014-10-01 DIAGNOSIS — I69351 Hemiplegia and hemiparesis following cerebral infarction affecting right dominant side: Secondary | ICD-10-CM | POA: Diagnosis not present

## 2014-10-01 DIAGNOSIS — R488 Other symbolic dysfunctions: Secondary | ICD-10-CM | POA: Diagnosis not present

## 2014-10-01 DIAGNOSIS — F0151 Vascular dementia with behavioral disturbance: Secondary | ICD-10-CM | POA: Diagnosis not present

## 2014-10-01 DIAGNOSIS — I6012 Nontraumatic subarachnoid hemorrhage from left middle cerebral artery: Secondary | ICD-10-CM | POA: Diagnosis not present

## 2014-10-01 DIAGNOSIS — M6281 Muscle weakness (generalized): Secondary | ICD-10-CM | POA: Diagnosis not present

## 2014-10-01 DIAGNOSIS — I63512 Cerebral infarction due to unspecified occlusion or stenosis of left middle cerebral artery: Secondary | ICD-10-CM | POA: Diagnosis not present

## 2014-10-02 DIAGNOSIS — I6012 Nontraumatic subarachnoid hemorrhage from left middle cerebral artery: Secondary | ICD-10-CM | POA: Diagnosis not present

## 2014-10-02 DIAGNOSIS — R488 Other symbolic dysfunctions: Secondary | ICD-10-CM | POA: Diagnosis not present

## 2014-10-02 DIAGNOSIS — M6281 Muscle weakness (generalized): Secondary | ICD-10-CM | POA: Diagnosis not present

## 2014-10-02 DIAGNOSIS — F0151 Vascular dementia with behavioral disturbance: Secondary | ICD-10-CM | POA: Diagnosis not present

## 2014-10-02 DIAGNOSIS — I69351 Hemiplegia and hemiparesis following cerebral infarction affecting right dominant side: Secondary | ICD-10-CM | POA: Diagnosis not present

## 2014-10-02 DIAGNOSIS — I63512 Cerebral infarction due to unspecified occlusion or stenosis of left middle cerebral artery: Secondary | ICD-10-CM | POA: Diagnosis not present

## 2014-10-03 DIAGNOSIS — R451 Restlessness and agitation: Secondary | ICD-10-CM | POA: Diagnosis not present

## 2014-10-03 DIAGNOSIS — D72829 Elevated white blood cell count, unspecified: Secondary | ICD-10-CM | POA: Diagnosis not present

## 2014-10-03 DIAGNOSIS — I69351 Hemiplegia and hemiparesis following cerebral infarction affecting right dominant side: Secondary | ICD-10-CM | POA: Diagnosis not present

## 2014-10-03 DIAGNOSIS — F0151 Vascular dementia with behavioral disturbance: Secondary | ICD-10-CM | POA: Diagnosis not present

## 2014-10-03 DIAGNOSIS — R488 Other symbolic dysfunctions: Secondary | ICD-10-CM | POA: Diagnosis not present

## 2014-10-03 DIAGNOSIS — I6012 Nontraumatic subarachnoid hemorrhage from left middle cerebral artery: Secondary | ICD-10-CM | POA: Diagnosis not present

## 2014-10-03 DIAGNOSIS — M6281 Muscle weakness (generalized): Secondary | ICD-10-CM | POA: Diagnosis not present

## 2014-10-03 DIAGNOSIS — I63512 Cerebral infarction due to unspecified occlusion or stenosis of left middle cerebral artery: Secondary | ICD-10-CM | POA: Diagnosis not present

## 2014-10-03 LAB — CBC AND DIFFERENTIAL
HEMATOCRIT: 41 % (ref 36–46)
HEMOGLOBIN: 14.1 g/dL (ref 12.0–16.0)
PLATELETS: 239 10*3/uL (ref 150–399)
WBC: 11.9 10^3/mL

## 2014-10-03 LAB — HEPATIC FUNCTION PANEL
ALT: 37 U/L — AB (ref 7–35)
AST: 25 U/L (ref 13–35)

## 2014-10-04 DIAGNOSIS — I6012 Nontraumatic subarachnoid hemorrhage from left middle cerebral artery: Secondary | ICD-10-CM | POA: Diagnosis not present

## 2014-10-04 DIAGNOSIS — I69351 Hemiplegia and hemiparesis following cerebral infarction affecting right dominant side: Secondary | ICD-10-CM | POA: Diagnosis not present

## 2014-10-04 DIAGNOSIS — M6281 Muscle weakness (generalized): Secondary | ICD-10-CM | POA: Diagnosis not present

## 2014-10-04 DIAGNOSIS — R488 Other symbolic dysfunctions: Secondary | ICD-10-CM | POA: Diagnosis not present

## 2014-10-04 DIAGNOSIS — F0151 Vascular dementia with behavioral disturbance: Secondary | ICD-10-CM | POA: Diagnosis not present

## 2014-10-04 DIAGNOSIS — I63512 Cerebral infarction due to unspecified occlusion or stenosis of left middle cerebral artery: Secondary | ICD-10-CM | POA: Diagnosis not present

## 2014-10-07 ENCOUNTER — Encounter: Payer: Self-pay | Admitting: Adult Health

## 2014-10-07 ENCOUNTER — Non-Acute Institutional Stay (SKILLED_NURSING_FACILITY): Payer: Medicare Other | Admitting: Adult Health

## 2014-10-07 DIAGNOSIS — E118 Type 2 diabetes mellitus with unspecified complications: Secondary | ICD-10-CM

## 2014-10-07 DIAGNOSIS — I69351 Hemiplegia and hemiparesis following cerebral infarction affecting right dominant side: Secondary | ICD-10-CM | POA: Diagnosis not present

## 2014-10-07 DIAGNOSIS — D72829 Elevated white blood cell count, unspecified: Secondary | ICD-10-CM | POA: Diagnosis not present

## 2014-10-07 DIAGNOSIS — I6012 Nontraumatic subarachnoid hemorrhage from left middle cerebral artery: Secondary | ICD-10-CM | POA: Diagnosis not present

## 2014-10-07 DIAGNOSIS — M6281 Muscle weakness (generalized): Secondary | ICD-10-CM | POA: Diagnosis not present

## 2014-10-07 DIAGNOSIS — F0151 Vascular dementia with behavioral disturbance: Secondary | ICD-10-CM | POA: Diagnosis not present

## 2014-10-07 DIAGNOSIS — I63512 Cerebral infarction due to unspecified occlusion or stenosis of left middle cerebral artery: Secondary | ICD-10-CM | POA: Diagnosis not present

## 2014-10-07 DIAGNOSIS — R488 Other symbolic dysfunctions: Secondary | ICD-10-CM | POA: Diagnosis not present

## 2014-10-07 NOTE — Progress Notes (Signed)
Patient ID: [name], female   DOB: 1918/05/24, 81 y.o.   MRN: [id number]     Nursing Home Location:  [facility]   Code Status: DNR  Patient Care Team: Merlene Laughter, MD as PCP - General (Internal Medicine) Larina Earthly, MD as Consulting Physician (Vascular Surgery) Ranelle Oyster, MD as Consulting Physician (Physical Medicine and Rehabilitation) Micki Riley, MD as Consulting Physician (Neurology) Hillis Range, MD as Consulting Physician (Cardiology)   Place of Service: SNF (31)  Chief Complaint  Patient presents with  . Acute Visit    blood sugars and agitation    HPI:  81 y.o.female  residing at [facility], rehab section. She has a hx of left MCA infarct with hemorrhagic transformation in April of 2016. She has been in rehab and received therapy. She remains with residual right arm weakness but is now ambulatory with a walker. She is right handed but is using her left arm to feed herself. She will be transferred to enhanced AL here at [facility] this week. She has had some issues with agitation and aricept was decreased to 5 mg and namenda was decreased to 21 mg on 9/7.  Since that time she still has periods of agitation but the staff states there has been improvement. She tends to get angry easily during her morning and evening routine and sometimes with meals.  She has a hx of DM2. Her BMP showed glucose of 201 on 9/8 and a subsequent A1C was 7.5% on 09/26/14.   Her blood sugars have ranged 119-158 in the am. She has not had any signs of infection such as cough, congestion, dysuria, frequency, etc. She had a clean urine drawn on 9/6.    She has had an WBC of 11.9 and 11.8 for the last two readings with no clear etiology.    Review of Systems:  Review of Systems  Constitutional: Negative for fever, chills, diaphoresis, activity change, appetite change and fatigue.  HENT: Negative for congestion, sinus pressure and sneezing.     Respiratory: Negative for cough, shortness of breath and wheezing.   Cardiovascular: Negative for chest pain, palpitations and leg swelling.  Gastrointestinal: Negative for abdominal pain, constipation and abdominal distention.  Genitourinary: Negative for dysuria and frequency.  Musculoskeletal: Positive for gait problem.  Neurological: Positive for facial asymmetry. Negative for dizziness, tremors, seizures, syncope and speech difficulty.  Psychiatric/Behavioral: Positive for behavioral problems, confusion and agitation. Negative for suicidal ideas, hallucinations, sleep disturbance and self-injury.    Medications: Patient's Medications  New Prescriptions   No medications on file  Previous Medications   ASPIRIN EC 81 MG TABLET    Take 81 mg by mouth daily.   CALCIUM CARBONATE-VITAMIN D (CALTRATE 600+D PO)    Take 1 tablet by mouth daily.   CARTIA XT 120 MG 24 HR CAPSULE    Take 120 mg by mouth.    DONEPEZIL (ARICEPT) 10 MG TABLET    Take 1 tablet (10 mg total) by mouth at bedtime.   LEVOTHYROXINE (SYNTHROID) 50 MCG TABLET    Take 25 mcg by mouth daily before breakfast.   MULTAQ 400 MG TABLET    TAKE 1 TABLET BY MOUTH TWICE DAILY   NAMENDA XR 7 MG CP24 24 HR CAPSULE       NITROGLYCERIN (NITROSTAT) 0.4 MG SL TABLET    Place 0.4 mg under the tongue every 5 (five) minutes as needed for chest pain (MAX 3 TABLETS).    ROSUVASTATIN (CRESTOR) 5  MG TABLET    Take 5 mg by mouth at bedtime.    ZETIA 10 MG TABLET    TAKE 1 TABLET BY MOUTH ONCE DAILY  Modified Medications   No medications on file  Discontinued Medications   No medications on file     Physical Exam:  Filed Vitals:   10/07/14 1407  BP: 162/72  Pulse: 81  Temp: 97.8 F (36.6 C)  Resp: 18  Weight: 126 lb 6.4 oz (57.335 kg)  SpO2: 99%    Physical Exam  Constitutional: No distress.  HENT:  Head: Normocephalic and atraumatic.  Nose: Nose normal.  Mouth/Throat: Oropharynx is clear and moist. No oropharyngeal exudate.   Eyes: Conjunctivae and EOM are normal. Pupils are equal, round, and reactive to light. Right eye exhibits no discharge. Left eye exhibits no discharge.  Neck: Normal range of motion. Neck supple. No JVD present. No tracheal deviation present. No thyromegaly present.  Cardiovascular: Normal rate and regular rhythm.   No murmur heard. No edema  Pulmonary/Chest: Effort normal and breath sounds normal. No respiratory distress. She has no wheezes.  Abdominal: Soft. Bowel sounds are normal. She exhibits no distension. There is no tenderness.  Musculoskeletal:  RUE 3/5, LUE and BLE 5/5  Lymphadenopathy:    She has no cervical adenopathy.  Neurological: She is alert.  F/c, oriented to self, place, but not time or situation  Skin: She is not diaphoretic.  Psychiatric: Affect normal.    Wt Readings from Last 3 Encounters:  10/07/14 126 lb 6.4 oz (57.335 kg)  09/24/14 125 lb 12.8 oz (57.063 kg)  09/20/14 126 lb 6.4 oz (57.335 kg)     Labs reviewed/Significant Diagnostic Results:  Basic Metabolic Panel:  Recent Labs  25/36/64 0703 05/26/14 0645 05/27/14 0531 09/24/14  NA 138 136 138 139  K 3.9 3.9 4.1 4.5  CL 107 107 109  --   CO2 22 18* 21*  --   GLUCOSE 103* 118* 122*  --   BUN 22*  CREATININE 1.22* 1.18* 1.30* 1.3*  CALCIUM 8.9 9.1 8.9  --    Liver Function Tests:  Recent Labs  05/08/14 1652 05/14/14 1420 10/03/14  AST 38* 23 25  ALT 29 20 37*  ALKPHOS 63 64  --   BILITOT 0.6 0.4  --   PROT 7.1 7.0  --   ALBUMIN 3.6 3.1*  --    No results for input(s): LIPASE, AMYLASE in the last 8760 hours. No results for input(s): AMMONIA in the last 8760 hours. CBC:  Recent Labs  05/08/14 1652  05/13/14 0235 05/14/14 1420 05/17/14 0733 09/24/14  WBC 11.6*  < > 12.0* 10.6* 8.2 11.8  NEUTROABS 7.6  --  8.7* 7.5  --   --   HGB 14.9  < > 14.1 14.6 14.4 14.8  HCT 44.1  < > 42.1 43.2 43.2 45  MCV 91.5  < > 91.1 90.8 90.4  --   PLT 221  < > 229 273 293 272  < > =  values in this interval not displayed. CBG:  Recent Labs  05/25/14 2044 05/26/14 0612 05/27/14 1150  GLUCAP 130* 107* 124*   TSH:  Recent Labs  09/24/14  TSH 4.00   A1C: Lab Results  Component Value Date   HGBA1C 7.5* 09/26/2014   Lipid Panel:  Recent Labs  05/09/14 0333 09/24/14  CHOL 137 158  HDL 47 67  LDLCALC 74 67  TRIG 79 122  CHOLHDL 2.9  --  Assessment/Plan   1. Vascular dementia, with behavioral disturbance -improved behavior with namenda and aricept decrease, will decrease namenda to 14 mg daily and monitor response as there are still some episodes of yelling. If she continues to improve will d/c all together as the reason for starting namenda was agitation to start with.    2. Diabetes mellitus type 2 with complications -I discussed with Dr. Newell Coral, her son and POA, regarding her elevated blood sugars. He is declining starting metformin at this time. He does not want his mom on any new medicines if necessary and does not want to be aggressive given her age and debility. We agreed that if it continues to rise then we would reconsider. We will check her CBG fasting Qmonday and monitor. She is on a heart healthy diet but does not wish to change to low sugar. At her age an A1C of <8% is acceptable.  3. Leukocytosis -would not work up any further but monitor for s/s of infection      [name], ANP [facility] [phone]

## 2014-10-08 DIAGNOSIS — I6012 Nontraumatic subarachnoid hemorrhage from left middle cerebral artery: Secondary | ICD-10-CM | POA: Diagnosis not present

## 2014-10-08 DIAGNOSIS — R488 Other symbolic dysfunctions: Secondary | ICD-10-CM | POA: Diagnosis not present

## 2014-10-08 DIAGNOSIS — F0151 Vascular dementia with behavioral disturbance: Secondary | ICD-10-CM | POA: Diagnosis not present

## 2014-10-08 DIAGNOSIS — I63512 Cerebral infarction due to unspecified occlusion or stenosis of left middle cerebral artery: Secondary | ICD-10-CM | POA: Diagnosis not present

## 2014-10-08 DIAGNOSIS — M6281 Muscle weakness (generalized): Secondary | ICD-10-CM | POA: Diagnosis not present

## 2014-10-08 DIAGNOSIS — I69351 Hemiplegia and hemiparesis following cerebral infarction affecting right dominant side: Secondary | ICD-10-CM | POA: Diagnosis not present

## 2014-10-09 DIAGNOSIS — I63512 Cerebral infarction due to unspecified occlusion or stenosis of left middle cerebral artery: Secondary | ICD-10-CM | POA: Diagnosis not present

## 2014-10-09 DIAGNOSIS — M6281 Muscle weakness (generalized): Secondary | ICD-10-CM | POA: Diagnosis not present

## 2014-10-09 DIAGNOSIS — F0151 Vascular dementia with behavioral disturbance: Secondary | ICD-10-CM | POA: Diagnosis not present

## 2014-10-09 DIAGNOSIS — I69351 Hemiplegia and hemiparesis following cerebral infarction affecting right dominant side: Secondary | ICD-10-CM | POA: Diagnosis not present

## 2014-10-09 DIAGNOSIS — R488 Other symbolic dysfunctions: Secondary | ICD-10-CM | POA: Diagnosis not present

## 2014-10-09 DIAGNOSIS — I6012 Nontraumatic subarachnoid hemorrhage from left middle cerebral artery: Secondary | ICD-10-CM | POA: Diagnosis not present

## 2014-10-10 DIAGNOSIS — M6281 Muscle weakness (generalized): Secondary | ICD-10-CM | POA: Diagnosis not present

## 2014-10-10 DIAGNOSIS — I69351 Hemiplegia and hemiparesis following cerebral infarction affecting right dominant side: Secondary | ICD-10-CM | POA: Diagnosis not present

## 2014-10-10 DIAGNOSIS — F0151 Vascular dementia with behavioral disturbance: Secondary | ICD-10-CM | POA: Diagnosis not present

## 2014-10-10 DIAGNOSIS — I63512 Cerebral infarction due to unspecified occlusion or stenosis of left middle cerebral artery: Secondary | ICD-10-CM | POA: Diagnosis not present

## 2014-10-10 DIAGNOSIS — R488 Other symbolic dysfunctions: Secondary | ICD-10-CM | POA: Diagnosis not present

## 2014-10-10 DIAGNOSIS — I6012 Nontraumatic subarachnoid hemorrhage from left middle cerebral artery: Secondary | ICD-10-CM | POA: Diagnosis not present

## 2014-10-11 DIAGNOSIS — I69351 Hemiplegia and hemiparesis following cerebral infarction affecting right dominant side: Secondary | ICD-10-CM | POA: Diagnosis not present

## 2014-10-11 DIAGNOSIS — M6281 Muscle weakness (generalized): Secondary | ICD-10-CM | POA: Diagnosis not present

## 2014-10-11 DIAGNOSIS — R488 Other symbolic dysfunctions: Secondary | ICD-10-CM | POA: Diagnosis not present

## 2014-10-11 DIAGNOSIS — I63512 Cerebral infarction due to unspecified occlusion or stenosis of left middle cerebral artery: Secondary | ICD-10-CM | POA: Diagnosis not present

## 2014-10-11 DIAGNOSIS — F0151 Vascular dementia with behavioral disturbance: Secondary | ICD-10-CM | POA: Diagnosis not present

## 2014-10-11 DIAGNOSIS — I6012 Nontraumatic subarachnoid hemorrhage from left middle cerebral artery: Secondary | ICD-10-CM | POA: Diagnosis not present

## 2014-10-13 DIAGNOSIS — R488 Other symbolic dysfunctions: Secondary | ICD-10-CM | POA: Diagnosis not present

## 2014-10-13 DIAGNOSIS — I63512 Cerebral infarction due to unspecified occlusion or stenosis of left middle cerebral artery: Secondary | ICD-10-CM | POA: Diagnosis not present

## 2014-10-13 DIAGNOSIS — F0151 Vascular dementia with behavioral disturbance: Secondary | ICD-10-CM | POA: Diagnosis not present

## 2014-10-13 DIAGNOSIS — I69351 Hemiplegia and hemiparesis following cerebral infarction affecting right dominant side: Secondary | ICD-10-CM | POA: Diagnosis not present

## 2014-10-13 DIAGNOSIS — I6012 Nontraumatic subarachnoid hemorrhage from left middle cerebral artery: Secondary | ICD-10-CM | POA: Diagnosis not present

## 2014-10-13 DIAGNOSIS — M6281 Muscle weakness (generalized): Secondary | ICD-10-CM | POA: Diagnosis not present

## 2014-10-14 DIAGNOSIS — F0151 Vascular dementia with behavioral disturbance: Secondary | ICD-10-CM | POA: Diagnosis not present

## 2014-10-14 DIAGNOSIS — I6012 Nontraumatic subarachnoid hemorrhage from left middle cerebral artery: Secondary | ICD-10-CM | POA: Diagnosis not present

## 2014-10-14 DIAGNOSIS — I63512 Cerebral infarction due to unspecified occlusion or stenosis of left middle cerebral artery: Secondary | ICD-10-CM | POA: Diagnosis not present

## 2014-10-14 DIAGNOSIS — R488 Other symbolic dysfunctions: Secondary | ICD-10-CM | POA: Diagnosis not present

## 2014-10-14 DIAGNOSIS — I69351 Hemiplegia and hemiparesis following cerebral infarction affecting right dominant side: Secondary | ICD-10-CM | POA: Diagnosis not present

## 2014-10-14 DIAGNOSIS — M6281 Muscle weakness (generalized): Secondary | ICD-10-CM | POA: Diagnosis not present

## 2014-10-15 DIAGNOSIS — I63512 Cerebral infarction due to unspecified occlusion or stenosis of left middle cerebral artery: Secondary | ICD-10-CM | POA: Diagnosis not present

## 2014-10-15 DIAGNOSIS — I6012 Nontraumatic subarachnoid hemorrhage from left middle cerebral artery: Secondary | ICD-10-CM | POA: Diagnosis not present

## 2014-10-15 DIAGNOSIS — R488 Other symbolic dysfunctions: Secondary | ICD-10-CM | POA: Diagnosis not present

## 2014-10-15 DIAGNOSIS — F0151 Vascular dementia with behavioral disturbance: Secondary | ICD-10-CM | POA: Diagnosis not present

## 2014-10-15 DIAGNOSIS — I69351 Hemiplegia and hemiparesis following cerebral infarction affecting right dominant side: Secondary | ICD-10-CM | POA: Diagnosis not present

## 2014-10-15 DIAGNOSIS — M6281 Muscle weakness (generalized): Secondary | ICD-10-CM | POA: Diagnosis not present

## 2014-10-16 DIAGNOSIS — M6281 Muscle weakness (generalized): Secondary | ICD-10-CM | POA: Diagnosis not present

## 2014-10-16 DIAGNOSIS — I69351 Hemiplegia and hemiparesis following cerebral infarction affecting right dominant side: Secondary | ICD-10-CM | POA: Diagnosis not present

## 2014-10-16 DIAGNOSIS — R488 Other symbolic dysfunctions: Secondary | ICD-10-CM | POA: Diagnosis not present

## 2014-10-16 DIAGNOSIS — I63512 Cerebral infarction due to unspecified occlusion or stenosis of left middle cerebral artery: Secondary | ICD-10-CM | POA: Diagnosis not present

## 2014-10-16 DIAGNOSIS — F0151 Vascular dementia with behavioral disturbance: Secondary | ICD-10-CM | POA: Diagnosis not present

## 2014-10-16 DIAGNOSIS — I6012 Nontraumatic subarachnoid hemorrhage from left middle cerebral artery: Secondary | ICD-10-CM | POA: Diagnosis not present

## 2014-10-17 DIAGNOSIS — R488 Other symbolic dysfunctions: Secondary | ICD-10-CM | POA: Diagnosis not present

## 2014-10-17 DIAGNOSIS — I69351 Hemiplegia and hemiparesis following cerebral infarction affecting right dominant side: Secondary | ICD-10-CM | POA: Diagnosis not present

## 2014-10-17 DIAGNOSIS — M6281 Muscle weakness (generalized): Secondary | ICD-10-CM | POA: Diagnosis not present

## 2014-10-17 DIAGNOSIS — F0151 Vascular dementia with behavioral disturbance: Secondary | ICD-10-CM | POA: Diagnosis not present

## 2014-10-17 DIAGNOSIS — I6012 Nontraumatic subarachnoid hemorrhage from left middle cerebral artery: Secondary | ICD-10-CM | POA: Diagnosis not present

## 2014-10-17 DIAGNOSIS — I63512 Cerebral infarction due to unspecified occlusion or stenosis of left middle cerebral artery: Secondary | ICD-10-CM | POA: Diagnosis not present

## 2014-10-18 DIAGNOSIS — F0151 Vascular dementia with behavioral disturbance: Secondary | ICD-10-CM | POA: Diagnosis not present

## 2014-10-18 DIAGNOSIS — M6281 Muscle weakness (generalized): Secondary | ICD-10-CM | POA: Diagnosis not present

## 2014-10-18 DIAGNOSIS — R488 Other symbolic dysfunctions: Secondary | ICD-10-CM | POA: Diagnosis not present

## 2014-10-18 DIAGNOSIS — I6012 Nontraumatic subarachnoid hemorrhage from left middle cerebral artery: Secondary | ICD-10-CM | POA: Diagnosis not present

## 2014-10-18 DIAGNOSIS — I69351 Hemiplegia and hemiparesis following cerebral infarction affecting right dominant side: Secondary | ICD-10-CM | POA: Diagnosis not present

## 2014-10-18 DIAGNOSIS — I63512 Cerebral infarction due to unspecified occlusion or stenosis of left middle cerebral artery: Secondary | ICD-10-CM | POA: Diagnosis not present

## 2014-10-20 DIAGNOSIS — R451 Restlessness and agitation: Secondary | ICD-10-CM | POA: Diagnosis not present

## 2014-10-20 DIAGNOSIS — R3915 Urgency of urination: Secondary | ICD-10-CM | POA: Diagnosis not present

## 2014-10-21 DIAGNOSIS — R488 Other symbolic dysfunctions: Secondary | ICD-10-CM | POA: Diagnosis not present

## 2014-10-21 DIAGNOSIS — F0151 Vascular dementia with behavioral disturbance: Secondary | ICD-10-CM | POA: Diagnosis not present

## 2014-10-21 DIAGNOSIS — I63512 Cerebral infarction due to unspecified occlusion or stenosis of left middle cerebral artery: Secondary | ICD-10-CM | POA: Diagnosis not present

## 2014-10-21 DIAGNOSIS — I6012 Nontraumatic subarachnoid hemorrhage from left middle cerebral artery: Secondary | ICD-10-CM | POA: Diagnosis not present

## 2014-10-21 DIAGNOSIS — I69351 Hemiplegia and hemiparesis following cerebral infarction affecting right dominant side: Secondary | ICD-10-CM | POA: Diagnosis not present

## 2014-10-21 DIAGNOSIS — M6281 Muscle weakness (generalized): Secondary | ICD-10-CM | POA: Diagnosis not present

## 2014-10-21 DIAGNOSIS — I69393 Ataxia following cerebral infarction: Secondary | ICD-10-CM | POA: Diagnosis not present

## 2014-10-22 ENCOUNTER — Encounter: Payer: Self-pay | Admitting: Internal Medicine

## 2014-10-22 ENCOUNTER — Non-Acute Institutional Stay: Payer: Medicare Other | Admitting: Internal Medicine

## 2014-10-22 DIAGNOSIS — Z66 Do not resuscitate: Secondary | ICD-10-CM | POA: Diagnosis not present

## 2014-10-22 DIAGNOSIS — I63412 Cerebral infarction due to embolism of left middle cerebral artery: Secondary | ICD-10-CM

## 2014-10-22 DIAGNOSIS — F0151 Vascular dementia with behavioral disturbance: Secondary | ICD-10-CM | POA: Diagnosis not present

## 2014-10-22 DIAGNOSIS — M6281 Muscle weakness (generalized): Secondary | ICD-10-CM | POA: Diagnosis not present

## 2014-10-22 DIAGNOSIS — F515 Nightmare disorder: Secondary | ICD-10-CM | POA: Diagnosis not present

## 2014-10-22 DIAGNOSIS — R488 Other symbolic dysfunctions: Secondary | ICD-10-CM | POA: Diagnosis not present

## 2014-10-22 DIAGNOSIS — N3090 Cystitis, unspecified without hematuria: Secondary | ICD-10-CM

## 2014-10-22 DIAGNOSIS — I6012 Nontraumatic subarachnoid hemorrhage from left middle cerebral artery: Secondary | ICD-10-CM | POA: Diagnosis not present

## 2014-10-22 DIAGNOSIS — I69351 Hemiplegia and hemiparesis following cerebral infarction affecting right dominant side: Secondary | ICD-10-CM | POA: Diagnosis not present

## 2014-10-22 DIAGNOSIS — I63512 Cerebral infarction due to unspecified occlusion or stenosis of left middle cerebral artery: Secondary | ICD-10-CM | POA: Diagnosis not present

## 2014-10-22 NOTE — Progress Notes (Signed)
Patient ID: [name], female   DOB: 1918-08-24, 81 y.o.   MRN: [id number]  Location: [facility] enhanced Provider:  Zacchary Pompei L. Renato Gails, D.O., C.M.D.  Code Status:  DNR Goals of care: Advanced Directive information Does patient have an advance directive?: Yes, Type of Advance Directive: Healthcare Power of [place];Out of facility DNR (pink MOST or yellow form), Pre-existing out of facility DNR order (yellow form or pink MOST form): Yellow form placed in chart (order not valid for inpatient use)  Chief Complaint  Patient presents with  . 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  Staff noted she'd been up 3x in one hour to urinate and 4x overnight which was not customary for her.  A urine sample was obtained which was positive for infection and she's been started on antibiotics today with cipro awaiting final culture report.    For several months, she's had worsening behavioral concerns.  There were thoughts that the namenda was overstimulating her so we've cut back to  at this point.  She also was having nightmares and aricept was reduced to  from .  Behaviors continue per staff.  We just started her on depakote for mood stabilization  sprinkles bid.  She has been tolerating it without ill effects--she has been on it less than a week.    Upon speaking with her son, he reviewed her history with me.  Prior to her stroke, she was on no meds, she was then started on aricept  early on by Dr. Pearlean Brownie, this was later upped to  to maximize benefit after her stroke.  He also started her on namenda.  I had titrated this to max dose.  Her  son notes that she had increased behaviors about 1.5 months ago.  Since then we've reduced both meds and he thinks she is less verbally hostile toward him and she's having fewer nightmares as far as we can tell.    Review of Systems:  Review of Systems  Constitutional: Negative for fever and chills.  Respiratory: Negative for shortness of breath.   Cardiovascular: Negative for chest pain.  Gastrointestinal: Negative for abdominal pain.  Genitourinary: Positive for urgency and frequency. Negative for dysuria and hematuria.  Neurological: Negative for dizziness and headaches.  Psychiatric/Behavioral: Positive for memory loss.       Behaviors; demands things of staff and gets agitated when they are not part of their job description    Past Medical History  Diagnosis Date  . Pure hypercholesterolemia   . Hyperlipidemia     Tolerating medication well as of 11/27/12  . Hypothyroidism   . Carpal tunnel syndrome   . Benign hypertensive heart disease without heart failure   . Coronary atherosclerosis of native coronary artery   . Spinal stenosis of lumbar region   . Osteopenia   . Postsurgical percutaneous transluminal coronary angioplasty status   . Essential hypertension, benign   . Benign hypertensive kidney disease with chronic kidney disease stage I through stage IV, or unspecified   . Diabetes mellitus with chronic kidney disease (HCC)   . NSTEMI (non-ST elevated myocardial infarction) (HCC) 09/21/09    BM stent RCA  . Atrial fibrillation (HCC)   .  Tachy-brady syndrome (HCC)     s/p PPM, battery change 08/01/09  . Sinoatrial node dysfunction (HCC)   . Chronic anemia   . Osteoarthritis   . H/O echocardiogram 07/19/09    EF = 60-65%  . Hyperglycemia     Mild  . Microalbuminuria 02/2010  . Vitamin D deficiency 10/2011  . Chronic renal disease, stage III     Patient Active Problem List   Diagnosis Date Noted  . Diabetes mellitus type 2 with complications (HCC) 10/07/2014  .  Vascular dementia 07/01/2014  . Left middle cerebral artery stroke (HCC) 05/13/2014  . Right hemiparesis (HCC) 05/13/2014  . Cerebral hemorrhage (HCC)   . Received intravenous tissue plasminogen activator (tPA) in emergency department   . Cytotoxic brain edema (HCC)   . CVA (cerebral infarction) 05/08/2014  . Tachy-brady syndrome (HCC)   . Encounter for long-term (current) use of other medications   . CAD in native artery   . Atrial fibrillation (HCC)   . Hypothyroidism   . Cardiac pacemaker in situ   . Essential hypertension, benign   . Pure hypercholesterolemia   . Sinoatrial node dysfunction (HCC)     Allergies  Allergen Reactions  . Fosamax [Alendronate Sodium]   . Other Other (See Comments)    Allergic to crab meat, but no shellfish allergy (can eat lobster, shrimp, etc.)  Crab meat causes angioedema of the throat.  Marland Kitchen Penicillins   . Sulfa Antibiotics     Medications: Patient's Medications  New Prescriptions   No medications on file  Previous Medications   ASPIRIN EC 81 MG TABLET    Take 81 mg by mouth daily.   CALCIUM CARBONATE-VITAMIN D (CALTRATE 600+D PO)    Take 1 tablet by mouth daily.   CARTIA XT 120 MG 24 HR CAPSULE    Take 120 mg by mouth.    DONEPEZIL (ARICEPT) 10 MG TABLET    Take 1 tablet (10 mg total) by mouth at bedtime.   LEVOTHYROXINE (SYNTHROID) 50 MCG TABLET    Take 25 mcg by mouth daily before breakfast.   MULTAQ 400 MG TABLET    TAKE 1 TABLET BY MOUTH TWICE DAILY   NAMENDA XR 7 MG CP24 24 HR CAPSULE       NITROGLYCERIN (NITROSTAT) 0.4 MG SL TABLET    Place 0.4 mg under the tongue every 5 (five) minutes as needed for chest pain (MAX 3 TABLETS).    ROSUVASTATIN (CRESTOR) 5 MG TABLET    Take 5 mg by mouth at bedtime.    ZETIA 10 MG TABLET    TAKE 1 TABLET BY MOUTH ONCE DAILY  Modified Medications   No medications on file  Discontinued Medications   No medications on file    Physical Exam: Filed Vitals:   10/22/14 1520  BP: 150/80  Pulse: 71   Temp: 98.4 F (36.9 C)  Resp: 24  Weight: 128 lb (58.06 kg)  SpO2: 96%   Body mass index is 28.71 kg/(m^2).  Physical Exam  Constitutional: She appears well-developed and well-nourished. No distress.  Cardiovascular: Normal rate and regular rhythm.   paced  Pulmonary/Chest: Effort normal and breath sounds normal. No respiratory distress.  Abdominal: Soft. Bowel sounds are normal.  Neurological: She is alert.  Right hemiparesis  Skin: Skin is warm and dry.  Psychiatric:  Very pleasant during our visit    Labs reviewed: Basic Metabolic Panel:  Recent Labs  24/40/10 0703 05/26/14 0645 05/27/14 0531 09/24/14  NA 138 136 138  139  K 3.9 3.9 4.1 4.5  CL 107 107 109  --   CO2 22 18* 21*  --   GLUCOSE 103* 118* 122*  --   BUN 18 14 17  22*  CREATININE 1.22* 1.18* 1.30* 1.3*  CALCIUM 8.9 9.1 8.9  --     Liver Function Tests:  Recent Labs  05/08/14 1652 05/14/14 1420 10/03/14  AST 38* 23 25  ALT 29 20 37*  ALKPHOS 63 64  --   BILITOT 0.6 0.4  --   PROT 7.1 7.0  --   ALBUMIN 3.6 3.1*  --     CBC:  Recent Labs  05/08/14 1652  05/13/14 0235 05/14/14 1420 05/17/14 0733 09/24/14 10/03/14  WBC 11.6*  < > 12.0* 10.6* 8.2 11.8 11.9  NEUTROABS 7.6  --  8.7* 7.5  --   --   --   HGB 14.9  < > 14.1 14.6 14.4 14.8 14.1  HCT 44.1  < > 42.1 43.2 43.2 45 41  MCV 91.5  < > 91.1 90.8 90.4  --   --   PLT 221  < > 229 273 293 272 239  < > = values in this interval not displayed.  Lab Results  Component Value Date   TSH 4.00 09/24/2014   Lab Results  Component Value Date   HGBA1C 7.5* 09/26/2014   Lab Results  Component Value Date   CHOL 158 09/24/2014   HDL 67 09/24/2014   LDLCALC 67 09/24/2014   TRIG 122 09/24/2014   CHOLHDL 2.9 05/09/2014     Patient Care Team: Merlene Laughter, MD as PCP - General (Internal Medicine) Larina Earthly, MD as Consulting Physician (Vascular Surgery) Ranelle Oyster, MD as Consulting Physician (Physical Medicine and  Rehabilitation) Micki Riley, MD as Consulting Physician (Neurology) Hillis Range, MD as Consulting Physician (Cardiology)  Assessment/Plan 1. Cystitis -start with cipro to treat gram negative organism and await sensitivities  2. Vascular dementia, with behavioral disturbance -has been having ongoing behaviors--some aspects improved with changes in stimulating meds like sleep cycle better and attitude toward her son, but still having agitation with staff -cont depakote sprinkles 125mg  po bid and if pt still not improving in another week, will d/c namenda altogether  3. Nightmares -seems these have resolved with lower dose aricept  4. Cerebral infarction due to embolism of left middle cerebral artery (HCC) -cont secondary prevention with zetia, crestor, cartia xt and baby asa  5. Advance directive indicates patient wish for do-not-resuscitate status - DNR (Do Not Resuscitate)   Family/ staff Communication: spoke with Dr. Newell Coral by phone and his wife for 21 mins and 20 more mins spent seeing patient and coordinating care with nursing staff and OT for total of 41 mins  Labs/tests ordered: await urine c+s  Yuridiana Formanek L. Keandra Medero, D.O. Geriatrics [facility] [address], [place] [address] Cell Phone (Mon-Fri 8am-5pm):  [phone] On Call:  [phone] & follow prompts after 5pm & weekends Office Phone:  [phone] Office Fax:  [phone]

## 2014-10-23 DIAGNOSIS — I6012 Nontraumatic subarachnoid hemorrhage from left middle cerebral artery: Secondary | ICD-10-CM | POA: Diagnosis not present

## 2014-10-23 DIAGNOSIS — I69351 Hemiplegia and hemiparesis following cerebral infarction affecting right dominant side: Secondary | ICD-10-CM | POA: Diagnosis not present

## 2014-10-23 DIAGNOSIS — Z23 Encounter for immunization: Secondary | ICD-10-CM | POA: Diagnosis not present

## 2014-10-23 DIAGNOSIS — R488 Other symbolic dysfunctions: Secondary | ICD-10-CM | POA: Diagnosis not present

## 2014-10-23 DIAGNOSIS — F0151 Vascular dementia with behavioral disturbance: Secondary | ICD-10-CM | POA: Diagnosis not present

## 2014-10-23 DIAGNOSIS — M6281 Muscle weakness (generalized): Secondary | ICD-10-CM | POA: Diagnosis not present

## 2014-10-23 DIAGNOSIS — I63512 Cerebral infarction due to unspecified occlusion or stenosis of left middle cerebral artery: Secondary | ICD-10-CM | POA: Diagnosis not present

## 2014-10-24 DIAGNOSIS — M6281 Muscle weakness (generalized): Secondary | ICD-10-CM | POA: Diagnosis not present

## 2014-10-24 DIAGNOSIS — R488 Other symbolic dysfunctions: Secondary | ICD-10-CM | POA: Diagnosis not present

## 2014-10-24 DIAGNOSIS — I69351 Hemiplegia and hemiparesis following cerebral infarction affecting right dominant side: Secondary | ICD-10-CM | POA: Diagnosis not present

## 2014-10-24 DIAGNOSIS — F0151 Vascular dementia with behavioral disturbance: Secondary | ICD-10-CM | POA: Diagnosis not present

## 2014-10-24 DIAGNOSIS — I63512 Cerebral infarction due to unspecified occlusion or stenosis of left middle cerebral artery: Secondary | ICD-10-CM | POA: Diagnosis not present

## 2014-10-24 DIAGNOSIS — I6012 Nontraumatic subarachnoid hemorrhage from left middle cerebral artery: Secondary | ICD-10-CM | POA: Diagnosis not present

## 2014-10-25 DIAGNOSIS — I69351 Hemiplegia and hemiparesis following cerebral infarction affecting right dominant side: Secondary | ICD-10-CM | POA: Diagnosis not present

## 2014-10-25 DIAGNOSIS — M6281 Muscle weakness (generalized): Secondary | ICD-10-CM | POA: Diagnosis not present

## 2014-10-25 DIAGNOSIS — I6012 Nontraumatic subarachnoid hemorrhage from left middle cerebral artery: Secondary | ICD-10-CM | POA: Diagnosis not present

## 2014-10-25 DIAGNOSIS — F0151 Vascular dementia with behavioral disturbance: Secondary | ICD-10-CM | POA: Diagnosis not present

## 2014-10-25 DIAGNOSIS — R488 Other symbolic dysfunctions: Secondary | ICD-10-CM | POA: Diagnosis not present

## 2014-10-25 DIAGNOSIS — I63512 Cerebral infarction due to unspecified occlusion or stenosis of left middle cerebral artery: Secondary | ICD-10-CM | POA: Diagnosis not present

## 2014-10-28 DIAGNOSIS — R488 Other symbolic dysfunctions: Secondary | ICD-10-CM | POA: Diagnosis not present

## 2014-10-28 DIAGNOSIS — F0151 Vascular dementia with behavioral disturbance: Secondary | ICD-10-CM | POA: Diagnosis not present

## 2014-10-28 DIAGNOSIS — M6281 Muscle weakness (generalized): Secondary | ICD-10-CM | POA: Diagnosis not present

## 2014-10-28 DIAGNOSIS — I69351 Hemiplegia and hemiparesis following cerebral infarction affecting right dominant side: Secondary | ICD-10-CM | POA: Diagnosis not present

## 2014-10-28 DIAGNOSIS — I6012 Nontraumatic subarachnoid hemorrhage from left middle cerebral artery: Secondary | ICD-10-CM | POA: Diagnosis not present

## 2014-10-28 DIAGNOSIS — I63512 Cerebral infarction due to unspecified occlusion or stenosis of left middle cerebral artery: Secondary | ICD-10-CM | POA: Diagnosis not present

## 2014-10-29 DIAGNOSIS — M6281 Muscle weakness (generalized): Secondary | ICD-10-CM | POA: Diagnosis not present

## 2014-10-29 DIAGNOSIS — F0151 Vascular dementia with behavioral disturbance: Secondary | ICD-10-CM | POA: Diagnosis not present

## 2014-10-29 DIAGNOSIS — R488 Other symbolic dysfunctions: Secondary | ICD-10-CM | POA: Diagnosis not present

## 2014-10-29 DIAGNOSIS — I6012 Nontraumatic subarachnoid hemorrhage from left middle cerebral artery: Secondary | ICD-10-CM | POA: Diagnosis not present

## 2014-10-29 DIAGNOSIS — I69351 Hemiplegia and hemiparesis following cerebral infarction affecting right dominant side: Secondary | ICD-10-CM | POA: Diagnosis not present

## 2014-10-29 DIAGNOSIS — I63512 Cerebral infarction due to unspecified occlusion or stenosis of left middle cerebral artery: Secondary | ICD-10-CM | POA: Diagnosis not present

## 2014-10-30 ENCOUNTER — Ambulatory Visit (INDEPENDENT_AMBULATORY_CARE_PROVIDER_SITE_OTHER): Payer: Medicare Other | Admitting: Neurology

## 2014-10-30 ENCOUNTER — Encounter: Payer: Self-pay | Admitting: Neurology

## 2014-10-30 VITALS — BP 134/74 | HR 69 | Ht <= 58 in | Wt 130.4 lb

## 2014-10-30 DIAGNOSIS — I639 Cerebral infarction, unspecified: Secondary | ICD-10-CM

## 2014-10-30 DIAGNOSIS — F0151 Vascular dementia with behavioral disturbance: Secondary | ICD-10-CM | POA: Diagnosis not present

## 2014-10-30 DIAGNOSIS — R488 Other symbolic dysfunctions: Secondary | ICD-10-CM | POA: Diagnosis not present

## 2014-10-30 DIAGNOSIS — M6281 Muscle weakness (generalized): Secondary | ICD-10-CM | POA: Diagnosis not present

## 2014-10-30 DIAGNOSIS — I6012 Nontraumatic subarachnoid hemorrhage from left middle cerebral artery: Secondary | ICD-10-CM | POA: Diagnosis not present

## 2014-10-30 DIAGNOSIS — I69351 Hemiplegia and hemiparesis following cerebral infarction affecting right dominant side: Secondary | ICD-10-CM | POA: Diagnosis not present

## 2014-10-30 DIAGNOSIS — I63512 Cerebral infarction due to unspecified occlusion or stenosis of left middle cerebral artery: Secondary | ICD-10-CM | POA: Diagnosis not present

## 2014-10-30 NOTE — Patient Instructions (Signed)
I had a long discussion with the patient and tried to reach the patient's son over the phone and left a message for him to call me back. She appears stable from the stroke standpoint and has been some improvement in her right grip and hand continues to have significant weakness. Continue aspirin for stroke prevention and use a walker for ambulation at all times. Continue Aricept 10 mg daily for dementia. Patient Namenda dose appears to being tapered and I  am not sure as to why. We will try to discuss this with the patient's son. Return for follow-up in 6 months or call earlier if necessary.
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
## 2014-10-31 DIAGNOSIS — R488 Other symbolic dysfunctions: Secondary | ICD-10-CM | POA: Diagnosis not present

## 2014-10-31 DIAGNOSIS — I69351 Hemiplegia and hemiparesis following cerebral infarction affecting right dominant side: Secondary | ICD-10-CM | POA: Diagnosis not present

## 2014-10-31 DIAGNOSIS — F0151 Vascular dementia with behavioral disturbance: Secondary | ICD-10-CM | POA: Diagnosis not present

## 2014-10-31 DIAGNOSIS — M6281 Muscle weakness (generalized): Secondary | ICD-10-CM | POA: Diagnosis not present

## 2014-10-31 DIAGNOSIS — I63512 Cerebral infarction due to unspecified occlusion or stenosis of left middle cerebral artery: Secondary | ICD-10-CM | POA: Diagnosis not present

## 2014-10-31 DIAGNOSIS — I6012 Nontraumatic subarachnoid hemorrhage from left middle cerebral artery: Secondary | ICD-10-CM | POA: Diagnosis not present

## 2014-11-01 DIAGNOSIS — I69351 Hemiplegia and hemiparesis following cerebral infarction affecting right dominant side: Secondary | ICD-10-CM | POA: Diagnosis not present

## 2014-11-01 DIAGNOSIS — R488 Other symbolic dysfunctions: Secondary | ICD-10-CM | POA: Diagnosis not present

## 2014-11-01 DIAGNOSIS — F0151 Vascular dementia with behavioral disturbance: Secondary | ICD-10-CM | POA: Diagnosis not present

## 2014-11-01 DIAGNOSIS — M6281 Muscle weakness (generalized): Secondary | ICD-10-CM | POA: Diagnosis not present

## 2014-11-01 DIAGNOSIS — I63512 Cerebral infarction due to unspecified occlusion or stenosis of left middle cerebral artery: Secondary | ICD-10-CM | POA: Diagnosis not present

## 2014-11-01 DIAGNOSIS — I6012 Nontraumatic subarachnoid hemorrhage from left middle cerebral artery: Secondary | ICD-10-CM | POA: Diagnosis not present

## 2014-11-04 DIAGNOSIS — F0151 Vascular dementia with behavioral disturbance: Secondary | ICD-10-CM | POA: Diagnosis not present

## 2014-11-04 DIAGNOSIS — I69351 Hemiplegia and hemiparesis following cerebral infarction affecting right dominant side: Secondary | ICD-10-CM | POA: Diagnosis not present

## 2014-11-04 DIAGNOSIS — M6281 Muscle weakness (generalized): Secondary | ICD-10-CM | POA: Diagnosis not present

## 2014-11-04 DIAGNOSIS — I6012 Nontraumatic subarachnoid hemorrhage from left middle cerebral artery: Secondary | ICD-10-CM | POA: Diagnosis not present

## 2014-11-04 DIAGNOSIS — I63512 Cerebral infarction due to unspecified occlusion or stenosis of left middle cerebral artery: Secondary | ICD-10-CM | POA: Diagnosis not present

## 2014-11-04 DIAGNOSIS — R488 Other symbolic dysfunctions: Secondary | ICD-10-CM | POA: Diagnosis not present

## 2014-11-05 DIAGNOSIS — R488 Other symbolic dysfunctions: Secondary | ICD-10-CM | POA: Diagnosis not present

## 2014-11-05 DIAGNOSIS — I6012 Nontraumatic subarachnoid hemorrhage from left middle cerebral artery: Secondary | ICD-10-CM | POA: Diagnosis not present

## 2014-11-05 DIAGNOSIS — I63512 Cerebral infarction due to unspecified occlusion or stenosis of left middle cerebral artery: Secondary | ICD-10-CM | POA: Diagnosis not present

## 2014-11-05 DIAGNOSIS — M6281 Muscle weakness (generalized): Secondary | ICD-10-CM | POA: Diagnosis not present

## 2014-11-05 DIAGNOSIS — B351 Tinea unguium: Secondary | ICD-10-CM | POA: Diagnosis not present

## 2014-11-05 DIAGNOSIS — I69351 Hemiplegia and hemiparesis following cerebral infarction affecting right dominant side: Secondary | ICD-10-CM | POA: Diagnosis not present

## 2014-11-05 DIAGNOSIS — F0151 Vascular dementia with behavioral disturbance: Secondary | ICD-10-CM | POA: Diagnosis not present

## 2014-11-05 DIAGNOSIS — L84 Corns and callosities: Secondary | ICD-10-CM | POA: Diagnosis not present

## 2014-11-05 DIAGNOSIS — E1159 Type 2 diabetes mellitus with other circulatory complications: Secondary | ICD-10-CM | POA: Diagnosis not present

## 2014-11-06 ENCOUNTER — Encounter: Payer: Self-pay | Admitting: *Deleted

## 2014-11-06 DIAGNOSIS — I69351 Hemiplegia and hemiparesis following cerebral infarction affecting right dominant side: Secondary | ICD-10-CM | POA: Diagnosis not present

## 2014-11-06 DIAGNOSIS — F0151 Vascular dementia with behavioral disturbance: Secondary | ICD-10-CM | POA: Diagnosis not present

## 2014-11-06 DIAGNOSIS — M6281 Muscle weakness (generalized): Secondary | ICD-10-CM | POA: Diagnosis not present

## 2014-11-06 DIAGNOSIS — R488 Other symbolic dysfunctions: Secondary | ICD-10-CM | POA: Diagnosis not present

## 2014-11-06 DIAGNOSIS — I6012 Nontraumatic subarachnoid hemorrhage from left middle cerebral artery: Secondary | ICD-10-CM | POA: Diagnosis not present

## 2014-11-06 DIAGNOSIS — I63512 Cerebral infarction due to unspecified occlusion or stenosis of left middle cerebral artery: Secondary | ICD-10-CM | POA: Diagnosis not present

## 2014-11-07 DIAGNOSIS — I63512 Cerebral infarction due to unspecified occlusion or stenosis of left middle cerebral artery: Secondary | ICD-10-CM | POA: Diagnosis not present

## 2014-11-07 DIAGNOSIS — I69351 Hemiplegia and hemiparesis following cerebral infarction affecting right dominant side: Secondary | ICD-10-CM | POA: Diagnosis not present

## 2014-11-07 DIAGNOSIS — M6281 Muscle weakness (generalized): Secondary | ICD-10-CM | POA: Diagnosis not present

## 2014-11-07 DIAGNOSIS — F0151 Vascular dementia with behavioral disturbance: Secondary | ICD-10-CM | POA: Diagnosis not present

## 2014-11-07 DIAGNOSIS — I6012 Nontraumatic subarachnoid hemorrhage from left middle cerebral artery: Secondary | ICD-10-CM | POA: Diagnosis not present

## 2014-11-07 DIAGNOSIS — R488 Other symbolic dysfunctions: Secondary | ICD-10-CM | POA: Diagnosis not present

## 2014-11-08 DIAGNOSIS — I63512 Cerebral infarction due to unspecified occlusion or stenosis of left middle cerebral artery: Secondary | ICD-10-CM | POA: Diagnosis not present

## 2014-11-08 DIAGNOSIS — R488 Other symbolic dysfunctions: Secondary | ICD-10-CM | POA: Diagnosis not present

## 2014-11-08 DIAGNOSIS — I69351 Hemiplegia and hemiparesis following cerebral infarction affecting right dominant side: Secondary | ICD-10-CM | POA: Diagnosis not present

## 2014-11-08 DIAGNOSIS — F0151 Vascular dementia with behavioral disturbance: Secondary | ICD-10-CM | POA: Diagnosis not present

## 2014-11-08 DIAGNOSIS — M6281 Muscle weakness (generalized): Secondary | ICD-10-CM | POA: Diagnosis not present

## 2014-11-08 DIAGNOSIS — I6012 Nontraumatic subarachnoid hemorrhage from left middle cerebral artery: Secondary | ICD-10-CM | POA: Diagnosis not present

## 2014-11-11 DIAGNOSIS — I63512 Cerebral infarction due to unspecified occlusion or stenosis of left middle cerebral artery: Secondary | ICD-10-CM | POA: Diagnosis not present

## 2014-11-11 DIAGNOSIS — R488 Other symbolic dysfunctions: Secondary | ICD-10-CM | POA: Diagnosis not present

## 2014-11-11 DIAGNOSIS — I6012 Nontraumatic subarachnoid hemorrhage from left middle cerebral artery: Secondary | ICD-10-CM | POA: Diagnosis not present

## 2014-11-11 DIAGNOSIS — M6281 Muscle weakness (generalized): Secondary | ICD-10-CM | POA: Diagnosis not present

## 2014-11-11 DIAGNOSIS — F0151 Vascular dementia with behavioral disturbance: Secondary | ICD-10-CM | POA: Diagnosis not present

## 2014-11-11 DIAGNOSIS — I69351 Hemiplegia and hemiparesis following cerebral infarction affecting right dominant side: Secondary | ICD-10-CM | POA: Diagnosis not present

## 2014-11-12 DIAGNOSIS — R488 Other symbolic dysfunctions: Secondary | ICD-10-CM | POA: Diagnosis not present

## 2014-11-12 DIAGNOSIS — I6012 Nontraumatic subarachnoid hemorrhage from left middle cerebral artery: Secondary | ICD-10-CM | POA: Diagnosis not present

## 2014-11-12 DIAGNOSIS — I63512 Cerebral infarction due to unspecified occlusion or stenosis of left middle cerebral artery: Secondary | ICD-10-CM | POA: Diagnosis not present

## 2014-11-12 DIAGNOSIS — F0151 Vascular dementia with behavioral disturbance: Secondary | ICD-10-CM | POA: Diagnosis not present

## 2014-11-12 DIAGNOSIS — M6281 Muscle weakness (generalized): Secondary | ICD-10-CM | POA: Diagnosis not present

## 2014-11-12 DIAGNOSIS — I69351 Hemiplegia and hemiparesis following cerebral infarction affecting right dominant side: Secondary | ICD-10-CM | POA: Diagnosis not present

## 2014-11-13 ENCOUNTER — Non-Acute Institutional Stay: Payer: Medicare Other | Admitting: Internal Medicine

## 2014-11-13 ENCOUNTER — Encounter: Payer: Self-pay | Admitting: Internal Medicine

## 2014-11-13 VITALS — BP 110/58 | HR 76 | Temp 97.7°F | Ht <= 58 in | Wt 130.0 lb

## 2014-11-13 DIAGNOSIS — I639 Cerebral infarction, unspecified: Secondary | ICD-10-CM

## 2014-11-13 DIAGNOSIS — I63412 Cerebral infarction due to embolism of left middle cerebral artery: Secondary | ICD-10-CM | POA: Diagnosis not present

## 2014-11-13 DIAGNOSIS — I480 Paroxysmal atrial fibrillation: Secondary | ICD-10-CM

## 2014-11-13 DIAGNOSIS — F0151 Vascular dementia with behavioral disturbance: Secondary | ICD-10-CM | POA: Diagnosis not present

## 2014-11-13 DIAGNOSIS — N3090 Cystitis, unspecified without hematuria: Secondary | ICD-10-CM | POA: Diagnosis not present

## 2014-11-13 DIAGNOSIS — E039 Hypothyroidism, unspecified: Secondary | ICD-10-CM | POA: Insufficient documentation

## 2014-11-13 DIAGNOSIS — E7800 Pure hypercholesterolemia, unspecified: Secondary | ICD-10-CM | POA: Diagnosis not present

## 2014-11-13 DIAGNOSIS — M6281 Muscle weakness (generalized): Secondary | ICD-10-CM | POA: Diagnosis not present

## 2014-11-13 DIAGNOSIS — M545 Low back pain, unspecified: Secondary | ICD-10-CM | POA: Insufficient documentation

## 2014-11-13 DIAGNOSIS — I10 Essential (primary) hypertension: Secondary | ICD-10-CM | POA: Diagnosis not present

## 2014-11-13 DIAGNOSIS — I2510 Atherosclerotic heart disease of native coronary artery without angina pectoris: Secondary | ICD-10-CM | POA: Diagnosis not present

## 2014-11-13 DIAGNOSIS — F515 Nightmare disorder: Secondary | ICD-10-CM | POA: Diagnosis not present

## 2014-11-13 DIAGNOSIS — R488 Other symbolic dysfunctions: Secondary | ICD-10-CM | POA: Diagnosis not present

## 2014-11-13 DIAGNOSIS — E118 Type 2 diabetes mellitus with unspecified complications: Secondary | ICD-10-CM | POA: Diagnosis not present

## 2014-11-13 DIAGNOSIS — I6012 Nontraumatic subarachnoid hemorrhage from left middle cerebral artery: Secondary | ICD-10-CM | POA: Diagnosis not present

## 2014-11-13 DIAGNOSIS — I63512 Cerebral infarction due to unspecified occlusion or stenosis of left middle cerebral artery: Secondary | ICD-10-CM | POA: Diagnosis not present

## 2014-11-13 DIAGNOSIS — I69351 Hemiplegia and hemiparesis following cerebral infarction affecting right dominant side: Secondary | ICD-10-CM | POA: Diagnosis not present

## 2014-11-13 DIAGNOSIS — Z950 Presence of cardiac pacemaker: Secondary | ICD-10-CM

## 2014-11-13 NOTE — Progress Notes (Signed)
Patient ID: [name], female   DOB: [birth year], 81 y.o.   MRN: [id number]   Location: [facility] Provider: Amery Minasyan L. Renato Gails, D.O., C.M.D.  Code Status: DNR Goals of Care: Advanced Directive information Does patient have an advance directive?: Yes, Type of Advance Directive: Healthcare Power of [place];Living will;Out of facility DNR (pink MOST or yellow form), Pre-existing out of facility DNR order (yellow form or pink MOST form): Yellow form placed in chart (order not valid for inpatient use)  Chief Complaint  Patient presents with  . Medical Management of Chronic Issues  . Establish Care    to get established, previously Dr. Pete Glatter, was d/c'd from Rehab and now in AL-enhanced    HPI: Patient is a 81 y.o. female seen in the [facility] today to establish with [facility] formally.  She has been  in rehab s/p stroke.  Since then, her dementia has worsened significantly.  Her medications have required adjustments due to behavior during care.  Due to these changes, it was recommended that she move to AL-enhanced where she could receive more assistance.    Staff note that recently her behaviors have improved since addition of depakote.  She's overall doing better as she's adjusting to her new environment.    Today, she denies chest pain, shortness of breath, palpitations, but asks how her heart is doing.  She has CAD and pafib s/p pacemaker.  She remains in afib.  She is no longer a candidate for anticoagulation due to her falls.  She has not fallen recently.  She is no longer having nightmares as far as staff are aware (but she does have a private room where she sleeps in this new environment).    She denies any dysuria and frequency has improved since treatment of her UTI with cipro at the beginning of this month.  BP well controlled on current regimen.  She c/o some back pain occasionally when walking (has scoliosis).  She denies radiation.  She denies pain when sitting or  reproducible tenderness.  She wears her brace on her right wrist due to her hemiparesis s/p CVA.  Review of Systems:  Review of Systems  Constitutional: Negative for fever and chills.  HENT: Positive for hearing loss. Negative for congestion.   Eyes: Negative for blurred vision.  Respiratory: Negative for shortness of breath.   Cardiovascular: Negative for chest pain, palpitations and leg swelling.  Gastrointestinal: Positive for heartburn. Negative for abdominal pain.       Noted after breakfast this am  Genitourinary: Negative for dysuria, urgency and frequency.  Musculoskeletal: Positive for back pain. Negative for myalgias and falls.  Skin: Negative for rash.  Neurological: Negative for dizziness, loss of consciousness and weakness.  Endo/Heme/Allergies: Does not bruise/bleed easily.  Psychiatric/Behavioral: Positive for memory loss. Negative for depression and hallucinations. The patient does not have insomnia.     Past Medical History  Diagnosis Date  . Pure hypercholesterolemia   . Hyperlipidemia     Tolerating medication well as of 11/27/12  . Hypothyroidism   . Carpal tunnel syndrome   . Benign hypertensive heart disease without heart failure   . Coronary atherosclerosis of native coronary artery   . Spinal stenosis of lumbar region   . Osteoporosis, senile   . Postsurgical percutaneous transluminal coronary angioplasty status   . Essential hypertension, benign   . Benign hypertensive kidney disease with chronic kidney disease stage I through stage IV, or unspecified   . Diabetes mellitus with chronic kidney disease (HCC)   .  NSTEMI (non-ST elevated myocardial infarction) (HCC) 09/21/09    BM stent RCA  . Atrial fibrillation (HCC)   . Tachy-brady syndrome (HCC)     s/p PPM, battery change 08/01/09  . Sinoatrial node dysfunction (HCC)   . Chronic anemia   . Osteoarthritis   . H/O echocardiogram 07/19/09    EF = 60-65%  . Hyperglycemia     Mild  . Microalbuminuria  02/2010  . Vitamin D deficiency 10/2011  . Chronic renal disease, stage III   . Chronic anemia   . Spinal stenosis   . Aortic sclerosis [facility])     Past Surgical History  Procedure Laterality Date  . Carpal tunnel release Right 02/07/09  . Vesicovaginal fistula closure w/ tah    . Oophorectomy      With hysterectomy. One ovary removed-fibroids.  . Pacemaker insertion  2004    Gen change 08/04/09 by Dr. Amil Amen. New pacing generator is a Medtronic EnRhythm, model D1939726, serial [id number]  . Pacemaker generator change  08/04/09    By Dr. Amil Amen. New pacing generator is a Medtronic EnRhythm, model D1939726, serial [id number]  . Percutaneous coronary stent intervention (pci-s)  05/08/02    (3 tandem Cypher stent), proximal and mid LAD  . Coronary angioplasty with stent placement  09/21/09    By Dr. Amil Amen. ACS, NSTEMI - BM stent RCA    Allergies  Allergen Reactions  . Fosamax [Alendronate Sodium]   . Other Other (See Comments)    Allergic to crab meat, but no shellfish allergy (can eat lobster, shrimp, etc.)  Crab meat causes angioedema of the throat.  Marland Kitchen Penicillins   . Sulfa Antibiotics       Medication List       This list is accurate as of: 11/13/14  8:49 AM.  Always use your most recent med list.               acetaminophen 325 MG tablet  Commonly known as:  TYLENOL  Take 650 mg by mouth. Take 2 tablets every 6 hours as needed for pain     aspirin EC 81 MG tablet  Take 81 mg by mouth daily.     CARTIA XT 120 MG 24 hr capsule  Generic drug:  diltiazem  Take 120 mg by mouth.     CRESTOR 5 MG tablet  Generic drug:  rosuvastatin  Take 5 mg by mouth at bedtime.     divalproex 125 MG capsule  Commonly known as:  DEPAKOTE SPRINKLE  2 (two) times daily.     donepezil 5 MG tablet  Commonly known as:  ARICEPT  Take 5 mg by mouth at bedtime.     levothyroxine 25 MCG tablet  Commonly known as:  SYNTHROID, LEVOTHROID  Take 25 mcg by mouth daily before breakfast.       magnesium hydroxide 400 MG/5ML suspension  Commonly known as:  MILK OF MAGNESIA  Take by mouth. 45 ml as needed     MULTAQ 400 MG tablet  Generic drug:  dronedarone  TAKE 1 TABLET BY MOUTH TWICE DAILY     NAMENDA XR 14 MG Cp24 24 hr capsule  Generic drug:  memantine  Take 14 mg by mouth. Take one tablet once a morning for memory     ZETIA 10 MG tablet  Generic drug:  ezetimibe  TAKE 1 TABLET BY MOUTH ONCE DAILY        Health Maintenance  Topic Date Due  . FOOT EXAM  11/14/1930  .  OPHTHALMOLOGY EXAM  11/14/1930  . URINE MICROALBUMIN  11/14/1930  . DEXA SCAN  11/13/1985  . PNA vac Low Risk Adult (2 of 2 - PCV13) 03/24/2006  . TETANUS/TDAP  03/19/2014  . INFLUENZA VACCINE  08/19/2014  . HEMOGLOBIN A1C  03/26/2015  . ZOSTAVAX  Completed    Physical Exam: Filed Vitals:   11/13/14 0844  BP: 110/58  Pulse: 76  Temp: 97.7 F (36.5 C)  TempSrc: Oral  Height: 4\' 8"  (1.422 m)  Weight: 130 lb (58.968 kg)  SpO2: 99%   Body mass index is 29.16 kg/(m^2). Physical Exam  Constitutional: She appears well-developed and well-nourished. No distress.  HENT:  Head: Normocephalic and atraumatic.  Right Ear: External ear normal.  Left Ear: External ear normal.  Nose: Nose normal.  Mouth/Throat: Oropharynx is clear and moist.  Eyes: Conjunctivae and EOM are normal. Pupils are equal, round, and reactive to light.  Neck: Normal range of motion. Neck supple. No JVD present. No thyromegaly present.  Cardiovascular:  irreg irreg; pacemaker in place  Pulmonary/Chest: Effort normal and breath sounds normal. No respiratory distress. She has no wheezes. She has no rales.  Abdominal: Soft. Bowel sounds are normal. She exhibits no distension. There is no tenderness.  Musculoskeletal:  Walks with walker; stooped scoliotic posture  Lymphadenopathy:    She has no cervical adenopathy.  Neurological: She is alert. She exhibits abnormal muscle tone.  Oriented to person only; right arm  monoparesis; mild right leg weakness  Skin: Skin is warm and dry.  Psychiatric:  Frequent sarcastic responses at first, then became very pleasant as we spoke more    Labs reviewed: Basic Metabolic Panel:  Recent Labs  45/40/9803/06/03 0703 05/26/14 0645 05/27/14 0531 09/24/14  NA 138 136 138 139  K 3.9 3.9 4.1 4.5  CL 107 107 109  --   CO2 22 18* 21*  --   GLUCOSE 103* 118* 122*  --   BUN 18 14 17  22*  CREATININE 1.22* 1.18* 1.30* 1.3*  CALCIUM 8.9 9.1 8.9  --   TSH  --   --   --  4.00   Liver Function Tests:  Recent Labs  05/08/14 1652 05/14/14 1420 10/03/14  AST 38* 23 25  ALT 29 20 37*  ALKPHOS 63 64  --   BILITOT 0.6 0.4  --   PROT 7.1 7.0  --   ALBUMIN 3.6 3.1*  --    No results for input(s): LIPASE, AMYLASE in the last 8760 hours. No results for input(s): AMMONIA in the last 8760 hours. CBC:  Recent Labs  05/08/14 1652  05/13/14 0235 05/14/14 1420 05/17/14 0733 09/24/14 10/03/14  WBC 11.6*  < > 12.0* 10.6* 8.2 11.8 11.9  NEUTROABS 7.6  --  8.7* 7.5  --   --   --   HGB 14.9  < > 14.1 14.6 14.4 14.8 14.1  HCT 44.1  < > 42.1 43.2 43.2 45 41  MCV 91.5  < > 91.1 90.8 90.4  --   --   PLT 221  < > 229 273 293 272 239  < > = values in this interval not displayed. Lipid Panel:  Recent Labs  05/09/14 0333 09/24/14  CHOL 137 158  HDL 47 67  LDLCALC 74 67  TRIG 79 122  CHOLHDL 2.9  --    Lab Results  Component Value Date   HGBA1C 7.5* 09/26/2014    Assessment/Plan 1. Vascular dementia, with behavioral disturbance -seems this has improved since  addition of depakote sprinkles -plan had been to further taper off namenda, but she is doing so much better per nursing staff that I'm afraid to "rock the boat" so will leave all psych meds the same  2. Type 2 diabetes mellitus with complication, without long-term current use of insulin (HCC) -last hba1c at goal of <8 last month -recheck in Dec  3. Cerebral infarction due to embolism of left middle cerebral  artery (HCC) -with monoplegia of right arm and worsening of her cognition  4. Nightmares -seem better with  aricept vs.   5. Cystitis -resolved with abx earlier this month  6. Paroxysmal atrial fibrillation (HCC) -stable, not on anticoagulation due to falls, is on cartia xt for rate control, multaq  7. Essential hypertension, benign -bp adequately controlled when reviewed  8. Pure hypercholesterolemia -lipids at goal with zetia and crestor  9. Hemiparesis affecting right side as late effect of stroke (HCC)-cont AL enhanced level of care  10. Hypothyroidism, unspecified hypothyroidism type -cont current low dose synthroid  11. CAD in native artery -cont secondary prevention of MI with bp and lipid control  12. Cardiac pacemaker in situ -noted, follows with cardiology  13. Midline low back pain without sciatica -seems to be worse when walking--suspect some spinal stenosis, does not persist  Labs/tests ordered:  Cbc, cmp, hba1c, tsh in 2 mos Next appt:  3 mos for med mgt  Jaclyn Carew L. Marissah Vandemark, D.O. Geriatrics [facility] [address], [place] [address] Cell Phone (Mon-Fri 8am-5pm):  [phone] On Call:  [phone] & follow prompts after 5pm & weekends Office Phone:  [phone] Office Fax:  [phone]

## 2014-11-14 DIAGNOSIS — M6281 Muscle weakness (generalized): Secondary | ICD-10-CM | POA: Diagnosis not present

## 2014-11-14 DIAGNOSIS — I69351 Hemiplegia and hemiparesis following cerebral infarction affecting right dominant side: Secondary | ICD-10-CM | POA: Diagnosis not present

## 2014-11-14 DIAGNOSIS — I6012 Nontraumatic subarachnoid hemorrhage from left middle cerebral artery: Secondary | ICD-10-CM | POA: Diagnosis not present

## 2014-11-14 DIAGNOSIS — F0151 Vascular dementia with behavioral disturbance: Secondary | ICD-10-CM | POA: Diagnosis not present

## 2014-11-14 DIAGNOSIS — R488 Other symbolic dysfunctions: Secondary | ICD-10-CM | POA: Diagnosis not present

## 2014-11-14 DIAGNOSIS — I63512 Cerebral infarction due to unspecified occlusion or stenosis of left middle cerebral artery: Secondary | ICD-10-CM | POA: Diagnosis not present

## 2014-11-15 DIAGNOSIS — F0151 Vascular dementia with behavioral disturbance: Secondary | ICD-10-CM | POA: Diagnosis not present

## 2014-11-15 DIAGNOSIS — M6281 Muscle weakness (generalized): Secondary | ICD-10-CM | POA: Diagnosis not present

## 2014-11-15 DIAGNOSIS — R488 Other symbolic dysfunctions: Secondary | ICD-10-CM | POA: Diagnosis not present

## 2014-11-15 DIAGNOSIS — I6012 Nontraumatic subarachnoid hemorrhage from left middle cerebral artery: Secondary | ICD-10-CM | POA: Diagnosis not present

## 2014-11-15 DIAGNOSIS — I69351 Hemiplegia and hemiparesis following cerebral infarction affecting right dominant side: Secondary | ICD-10-CM | POA: Diagnosis not present

## 2014-11-15 DIAGNOSIS — I63512 Cerebral infarction due to unspecified occlusion or stenosis of left middle cerebral artery: Secondary | ICD-10-CM | POA: Diagnosis not present

## 2014-11-18 DIAGNOSIS — F0151 Vascular dementia with behavioral disturbance: Secondary | ICD-10-CM | POA: Diagnosis not present

## 2014-11-18 DIAGNOSIS — I63512 Cerebral infarction due to unspecified occlusion or stenosis of left middle cerebral artery: Secondary | ICD-10-CM | POA: Diagnosis not present

## 2014-11-18 DIAGNOSIS — M6281 Muscle weakness (generalized): Secondary | ICD-10-CM | POA: Diagnosis not present

## 2014-11-18 DIAGNOSIS — I6012 Nontraumatic subarachnoid hemorrhage from left middle cerebral artery: Secondary | ICD-10-CM | POA: Diagnosis not present

## 2014-11-18 DIAGNOSIS — I69351 Hemiplegia and hemiparesis following cerebral infarction affecting right dominant side: Secondary | ICD-10-CM | POA: Diagnosis not present

## 2014-11-18 DIAGNOSIS — R488 Other symbolic dysfunctions: Secondary | ICD-10-CM | POA: Diagnosis not present

## 2014-11-19 DIAGNOSIS — I69393 Ataxia following cerebral infarction: Secondary | ICD-10-CM | POA: Diagnosis not present

## 2014-11-19 DIAGNOSIS — M6281 Muscle weakness (generalized): Secondary | ICD-10-CM | POA: Diagnosis not present

## 2014-11-19 DIAGNOSIS — I6012 Nontraumatic subarachnoid hemorrhage from left middle cerebral artery: Secondary | ICD-10-CM | POA: Diagnosis not present

## 2014-11-19 DIAGNOSIS — I69351 Hemiplegia and hemiparesis following cerebral infarction affecting right dominant side: Secondary | ICD-10-CM | POA: Diagnosis not present

## 2014-11-20 DIAGNOSIS — M6281 Muscle weakness (generalized): Secondary | ICD-10-CM | POA: Diagnosis not present

## 2014-11-20 DIAGNOSIS — I6012 Nontraumatic subarachnoid hemorrhage from left middle cerebral artery: Secondary | ICD-10-CM | POA: Diagnosis not present

## 2014-11-20 DIAGNOSIS — I69393 Ataxia following cerebral infarction: Secondary | ICD-10-CM | POA: Diagnosis not present

## 2014-11-20 DIAGNOSIS — I69351 Hemiplegia and hemiparesis following cerebral infarction affecting right dominant side: Secondary | ICD-10-CM | POA: Diagnosis not present

## 2014-11-21 DIAGNOSIS — M6281 Muscle weakness (generalized): Secondary | ICD-10-CM | POA: Diagnosis not present

## 2014-11-21 DIAGNOSIS — I69393 Ataxia following cerebral infarction: Secondary | ICD-10-CM | POA: Diagnosis not present

## 2014-11-21 DIAGNOSIS — I6012 Nontraumatic subarachnoid hemorrhage from left middle cerebral artery: Secondary | ICD-10-CM | POA: Diagnosis not present

## 2014-11-21 DIAGNOSIS — I69351 Hemiplegia and hemiparesis following cerebral infarction affecting right dominant side: Secondary | ICD-10-CM | POA: Diagnosis not present

## 2014-11-21 DIAGNOSIS — N020 Recurrent and persistent hematuria with minor glomerular abnormality: Secondary | ICD-10-CM | POA: Diagnosis not present

## 2014-11-22 DIAGNOSIS — I6012 Nontraumatic subarachnoid hemorrhage from left middle cerebral artery: Secondary | ICD-10-CM | POA: Diagnosis not present

## 2014-11-22 DIAGNOSIS — I69351 Hemiplegia and hemiparesis following cerebral infarction affecting right dominant side: Secondary | ICD-10-CM | POA: Diagnosis not present

## 2014-11-22 DIAGNOSIS — I69393 Ataxia following cerebral infarction: Secondary | ICD-10-CM | POA: Diagnosis not present

## 2014-11-22 DIAGNOSIS — M6281 Muscle weakness (generalized): Secondary | ICD-10-CM | POA: Diagnosis not present

## 2014-11-25 DIAGNOSIS — I6012 Nontraumatic subarachnoid hemorrhage from left middle cerebral artery: Secondary | ICD-10-CM | POA: Diagnosis not present

## 2014-11-25 DIAGNOSIS — I69351 Hemiplegia and hemiparesis following cerebral infarction affecting right dominant side: Secondary | ICD-10-CM | POA: Diagnosis not present

## 2014-11-25 DIAGNOSIS — M6281 Muscle weakness (generalized): Secondary | ICD-10-CM | POA: Diagnosis not present

## 2014-11-25 DIAGNOSIS — I69393 Ataxia following cerebral infarction: Secondary | ICD-10-CM | POA: Diagnosis not present

## 2014-11-26 DIAGNOSIS — I69351 Hemiplegia and hemiparesis following cerebral infarction affecting right dominant side: Secondary | ICD-10-CM | POA: Diagnosis not present

## 2014-11-26 DIAGNOSIS — I6012 Nontraumatic subarachnoid hemorrhage from left middle cerebral artery: Secondary | ICD-10-CM | POA: Diagnosis not present

## 2014-11-26 DIAGNOSIS — M6281 Muscle weakness (generalized): Secondary | ICD-10-CM | POA: Diagnosis not present

## 2014-11-26 DIAGNOSIS — I69393 Ataxia following cerebral infarction: Secondary | ICD-10-CM | POA: Diagnosis not present

## 2014-11-27 DIAGNOSIS — I69393 Ataxia following cerebral infarction: Secondary | ICD-10-CM | POA: Diagnosis not present

## 2014-11-27 DIAGNOSIS — M6281 Muscle weakness (generalized): Secondary | ICD-10-CM | POA: Diagnosis not present

## 2014-11-27 DIAGNOSIS — I6012 Nontraumatic subarachnoid hemorrhage from left middle cerebral artery: Secondary | ICD-10-CM | POA: Diagnosis not present

## 2014-11-27 DIAGNOSIS — I69351 Hemiplegia and hemiparesis following cerebral infarction affecting right dominant side: Secondary | ICD-10-CM | POA: Diagnosis not present

## 2014-11-29 DIAGNOSIS — I69351 Hemiplegia and hemiparesis following cerebral infarction affecting right dominant side: Secondary | ICD-10-CM | POA: Diagnosis not present

## 2014-11-29 DIAGNOSIS — M6281 Muscle weakness (generalized): Secondary | ICD-10-CM | POA: Diagnosis not present

## 2014-11-29 DIAGNOSIS — I69393 Ataxia following cerebral infarction: Secondary | ICD-10-CM | POA: Diagnosis not present

## 2014-11-29 DIAGNOSIS — I6012 Nontraumatic subarachnoid hemorrhage from left middle cerebral artery: Secondary | ICD-10-CM | POA: Diagnosis not present

## 2014-12-02 DIAGNOSIS — I69393 Ataxia following cerebral infarction: Secondary | ICD-10-CM | POA: Diagnosis not present

## 2014-12-02 DIAGNOSIS — M6281 Muscle weakness (generalized): Secondary | ICD-10-CM | POA: Diagnosis not present

## 2014-12-02 DIAGNOSIS — I6012 Nontraumatic subarachnoid hemorrhage from left middle cerebral artery: Secondary | ICD-10-CM | POA: Diagnosis not present

## 2014-12-02 DIAGNOSIS — I69351 Hemiplegia and hemiparesis following cerebral infarction affecting right dominant side: Secondary | ICD-10-CM | POA: Diagnosis not present

## 2015-01-14 DIAGNOSIS — E039 Hypothyroidism, unspecified: Secondary | ICD-10-CM | POA: Diagnosis not present

## 2015-01-14 DIAGNOSIS — I10 Essential (primary) hypertension: Secondary | ICD-10-CM | POA: Diagnosis not present

## 2015-01-14 DIAGNOSIS — R739 Hyperglycemia, unspecified: Secondary | ICD-10-CM | POA: Diagnosis not present

## 2015-01-16 LAB — CBC WITH DIFFERENTIAL/PLATELET
Basophils Absolute: 0 10*3/uL (ref 0.0–0.1)
Basophils Relative: 0 % (ref 0–1)
Eosinophils Absolute: 0.3 10*3/uL (ref 0.0–0.7)
Eosinophils Relative: 3 % (ref 0–5)
HCT: 43.2 % (ref 36.0–46.0)
Hemoglobin: 14.6 g/dL (ref 12.0–15.0)
LYMPHS PCT: 22 % (ref 12–46)
Lymphs Abs: 2.4 10*3/uL (ref 0.7–4.0)
MCH: 30.7 pg (ref 26.0–34.0)
MCHC: 33.8 g/dL (ref 30.0–36.0)
MCV: 90.8 fL (ref 78.0–100.0)
Monocytes Absolute: 0.5 10*3/uL (ref 0.1–1.0)
Monocytes Relative: 5 % (ref 3–12)
Neutro Abs: 7.5 10*3/uL (ref 1.7–7.7)
Neutrophils Relative %: 70 % (ref 43–77)
PLATELETS: 273 10*3/uL (ref 150–400)
RBC: 4.76 MIL/uL (ref 3.87–5.11)
RDW: 13.6 % (ref 11.5–15.5)
WBC: 10.6 10*3/uL — ABNORMAL HIGH (ref 4.0–10.5)

## 2015-02-05 ENCOUNTER — Non-Acute Institutional Stay: Payer: Medicare Other | Admitting: Internal Medicine

## 2015-02-05 ENCOUNTER — Encounter: Payer: Self-pay | Admitting: Internal Medicine

## 2015-02-05 VITALS — BP 150/70 | HR 88 | Temp 97.8°F | Ht <= 58 in | Wt 135.0 lb

## 2015-02-05 DIAGNOSIS — E039 Hypothyroidism, unspecified: Secondary | ICD-10-CM | POA: Diagnosis not present

## 2015-02-05 DIAGNOSIS — F0151 Vascular dementia with behavioral disturbance: Secondary | ICD-10-CM | POA: Diagnosis not present

## 2015-02-05 DIAGNOSIS — E118 Type 2 diabetes mellitus with unspecified complications: Secondary | ICD-10-CM | POA: Diagnosis not present

## 2015-02-05 DIAGNOSIS — I10 Essential (primary) hypertension: Secondary | ICD-10-CM | POA: Diagnosis not present

## 2015-02-05 DIAGNOSIS — I480 Paroxysmal atrial fibrillation: Secondary | ICD-10-CM

## 2015-02-05 DIAGNOSIS — E7800 Pure hypercholesterolemia, unspecified: Secondary | ICD-10-CM

## 2015-02-05 NOTE — Patient Instructions (Signed)
Decrease namenda XR to  due to continued agitation/combativeness and possible overstimulation from this med.  Do this for one week, then discontinue.  Must document behaviors in matrix so I know when to make changes.

## 2015-02-05 NOTE — Progress Notes (Signed)
Patient ID: [name], female   DOB: 1919-01-05, 82 y.o.   MRN: [id number]   Location: [facility] Provider: Jordani Nunn L. Renato Gails, D.O., C.M.D.  Code Status: DNR Goals of Care: Advanced Directive information Does patient have an advance directive?: Yes, Type of Advance Directive: Healthcare Power of [place];Living will;Out of facility DNR (pink MOST or yellow form), Pre-existing out of facility DNR order (yellow form or pink MOST form): Yellow form placed in chart (order not valid for inpatient use)  Chief Complaint  Patient presents with  . Medical Management of Chronic Issues    blood sugar, A-Fib, blood pressure, thyroid    HPI: Patient is a 82 y.o. female seen in the office today for med mgt of chronic diseases.    Says she is feeling mad.  Staff (CNA) brought her to clinic and left her here alone.  Trying to leave multiple times.    Denies back pain.    Blood pressure is a little high.  But she is agitated and angry and does not understand why she is here when she has no complaints.    HL:  Last lipids at goal in sept.  Hypothyroid:  Last tsh at goal in sept.  Review of Systems:  Review of Systems  Constitutional: Negative for fever and chills.  HENT: Negative for congestion.   Eyes: Negative for blurred vision.  Respiratory: Negative for cough and shortness of breath.   Cardiovascular: Negative for chest pain and leg swelling.  Gastrointestinal: Positive for constipation. Negative for abdominal pain, blood in stool and melena.  Genitourinary: Negative for dysuria.  Musculoskeletal: Positive for back pain and falls.  Skin: Negative for rash.  Neurological: Negative for dizziness, loss of consciousness and headaches.  Psychiatric/Behavioral: Positive for memory loss.       Agitation and irritability    Past Medical History  Diagnosis Date  . Pure hypercholesterolemia   . Hyperlipidemia     Tolerating medication well as of 11/27/12  . Hypothyroidism   .  Carpal tunnel syndrome   . Benign hypertensive heart disease without heart failure   . Coronary atherosclerosis of native coronary artery   . Spinal stenosis of lumbar region   . Osteoporosis, senile   . Postsurgical percutaneous transluminal coronary angioplasty status   . Essential hypertension, benign   . Benign hypertensive kidney disease with chronic kidney disease stage I through stage IV, or unspecified   . Diabetes mellitus with chronic kidney disease (HCC)   . NSTEMI (non-ST elevated myocardial infarction) (HCC) 09/21/09    BM stent RCA  . Atrial fibrillation (HCC)   . Tachy-brady syndrome (HCC)     s/p PPM, battery change 08/01/09  . Sinoatrial node dysfunction (HCC)   . Chronic anemia   . Osteoarthritis   . H/O echocardiogram 07/19/09    EF = 60-65%  . Hyperglycemia     Mild  . Microalbuminuria 02/2010  . Vitamin D deficiency 10/2011  . Chronic renal disease, stage III   . Chronic anemia   . Spinal stenosis   . Aortic sclerosis [facility])     Past Surgical History  Procedure Laterality Date  . Carpal tunnel release Right 02/07/09  . Vesicovaginal fistula closure w/ tah    . Oophorectomy      With hysterectomy. One ovary removed-fibroids.  . Pacemaker insertion  2004    Gen change 08/04/09 by Dr. Amil Amen. New pacing generator is a Medtronic EnRhythm, model D1939726, serial [id number]  . Pacemaker generator change  08/04/09  By Dr. Amil Amen. New pacing generator is a Medtronic EnRhythm, model D1939726, serial [id number]  . Percutaneous coronary stent intervention (pci-s)  05/08/02    (3 tandem Cypher stent), proximal and mid LAD  . Coronary angioplasty with stent placement  09/21/09    By Dr. Amil Amen. ACS, NSTEMI - BM stent RCA    Allergies  Allergen Reactions  . Fosamax [Alendronate Sodium]   . Other Other (See Comments)    Allergic to crab meat, but no shellfish allergy (can eat lobster, shrimp, etc.)  Crab meat causes angioedema of the throat.  Marland Kitchen Penicillins   . Sulfa  Antibiotics       Medication List       This list is accurate as of: 02/05/15 11:56 AM.  Always use your most recent med list.               acetaminophen 325 MG tablet  Commonly known as:  TYLENOL  Take 650 mg by mouth. Take 2 tablets every 6 hours as needed for pain     aspirin EC 81 MG tablet  Take 81 mg by mouth daily.     CARTIA XT 120 MG 24 hr capsule  Generic drug:  diltiazem  Take 120 mg by mouth.     CRESTOR 5 MG tablet  Generic drug:  rosuvastatin  Take 5 mg by mouth at bedtime.     divalproex 125 MG capsule  Commonly known as:  DEPAKOTE SPRINKLE  2 (two) times daily.     donepezil 5 MG tablet  Commonly known as:  ARICEPT  Take 5 mg by mouth at bedtime.     levothyroxine 25 MCG tablet  Commonly known as:  SYNTHROID, LEVOTHROID  Take 25 mcg by mouth daily before breakfast.     magnesium hydroxide 400 MG/5ML suspension  Commonly known as:  MILK OF MAGNESIA  Take by mouth. 45 ml as needed     MULTAQ 400 MG tablet  Generic drug:  dronedarone  TAKE 1 TABLET BY MOUTH TWICE DAILY     NAMENDA XR 14 MG Cp24 24 hr capsule  Generic drug:  memantine  Take 14 mg by mouth. Take one tablet once a morning for memory     polyethylene glycol packet  Commonly known as:  MIRALAX / GLYCOLAX  Take 17 g by mouth daily.     ZETIA 10 MG tablet  Generic drug:  ezetimibe  TAKE 1 TABLET BY MOUTH ONCE DAILY        Health Maintenance  Topic Date Due  . FOOT EXAM  11/14/1930  . OPHTHALMOLOGY EXAM  11/14/1930  . URINE MICROALBUMIN  11/14/1930  . DEXA SCAN  11/13/1985  . PNA vac Low Risk Adult (2 of 2 - PCV13) 03/24/2006  . TETANUS/TDAP  03/19/2014  . HEMOGLOBIN A1C  03/26/2015  . INFLUENZA VACCINE  08/19/2015  . ZOSTAVAX  Completed    Physical Exam: Filed Vitals:   02/05/15 1137  BP: 150/70  Pulse: 88  Temp: 97.8 F (36.6 C)  TempSrc: Oral  Height:  (1.422 m)  Weight: 135 lb (61.236 kg)  SpO2: 98%   Body mass index is 30.28 kg/(m^2). Physical  Exam  Constitutional: She appears well-developed and well-nourished. No distress.  Cardiovascular: Intact distal pulses.   Murmur heard. irreg irreg  Pulmonary/Chest: Effort normal and breath sounds normal. No respiratory distress.  Abdominal: Soft. Bowel sounds are normal. She exhibits no distension. There is no tenderness.  Musculoskeletal: Normal range of motion.  Stooped posture, walks with rollator walker  Neurological: She is alert.  Skin: Skin is warm and dry.  Psychiatric:  Angry and irritable, had negative answer to every question today and says she doesn't want to be here    Labs reviewed: Basic Metabolic Panel:  Recent Labs  16/10/96  05/23/14 0703 05/26/14 0645 05/27/14 0531 09/24/14  NA 138  < > 138 136 138 139  K 4.4  < > 3.9 3.9 4.1 4.5  CL  --   < > 107 107 109  --   CO2  --   < > 22 18* 21*  --   GLUCOSE  --   < > 103* 118* 122*  --   BUN 26*  < > 22*  CREATININE 1.3*  < > 1.22* 1.18* 1.30* 1.3*  CALCIUM  --   < > 8.9 9.1 8.9  --   TSH 5.61  --   --   --   --  4.00  < > = values in this interval not displayed. Liver Function Tests:  Recent Labs  02/13/14 05/08/14 1652 05/14/14 1420 10/03/14  AST 18 38* 23 25  ALT 37*  ALKPHOS 64 63 64  --   BILITOT  --  0.6 0.4  --   PROT  --  7.1 7.0  --   ALBUMIN  --  3.6 3.1*  --    No results for input(s): LIPASE, AMYLASE in the last 8760 hours. No results for input(s): AMMONIA in the last 8760 hours. CBC:  Recent Labs  05/08/14 1652  05/13/14 0235 05/14/14 1420 05/17/14 0733 09/24/14 10/03/14  WBC 11.6*  < > 12.0* 10.6* 8.2 11.8 11.9  NEUTROABS 7.6  --  8.7* 7.5  --   --   --   HGB 14.9  < > 14.1 14.6 14.4 14.8 14.1  HCT 44.1  < > 42.1 43.2 43.2 45 41  MCV 91.5  < > 91.1 90.8 90.4  --   --   PLT 221  < > 229 273 293 272 239  < > = values in this interval not displayed. Lipid Panel:  Recent Labs  05/09/14 0333 09/24/14  CHOL 137 158  HDL 47 67  LDLCALC 74 67  TRIG 79 122    CHOLHDL 2.9  --    Lab Results  Component Value Date   HGBA1C 7.5* 09/26/2014    Assessment/Plan 1. Vascular dementia, with behavioral disturbance -seems there has not been a big change in terms of her agitation and behavior -will continue to take her off namenda which has been felt to be overstimulatory in her (calms some pts) -also doubt there is much benefit to aricept so might stop it next -cont depakote sprinkles which may have helped slightly  2. Type 2 diabetes mellitus with complication, without long-term current use of insulin (HCC) -cont asa 81, monitor hba1c, cont statin and zetia, not on ace/arb (has not needed additional bp meds historically  3. Paroxysmal atrial fibrillation (HCC) -cont multaq, pacer, cartia xt, baby asa  4. Essential hypertension, benign -bp elevated today but agitated  5. Pure hypercholesterolemia -cont crestor and zetia at this time--at goal in April '16  6. Hypothyroidism, unspecified hypothyroidism type -continues on synthroid , last tsh wnl in sept '16  Labs/tests ordered:  Orders Placed This Encounter  Procedures  . CBC and differential    This external order was created through the Results Console.  . Basic metabolic  panel    This external order was created through the Results Console.  . Hepatic function panel    This external order was created through the Results Console.  . Hemoglobin A1c    This external order was created through the Results Console.  . TSH    This external order was created through the Results Console.    Next appt:  06/11/2015 med mgt   [name] L. Merrick Feutz, D.O. Geriatrics [facility] [address], [place] [address] Cell Phone (Mon-Fri 8am-5pm):  [phone] On Call:  [phone] & follow prompts after 5pm & weekends Office Phone:  [phone] Office Fax:  [phone]

## 2015-04-30 ENCOUNTER — Ambulatory Visit (INDEPENDENT_AMBULATORY_CARE_PROVIDER_SITE_OTHER): Payer: Medicare Other | Admitting: Neurology

## 2015-04-30 ENCOUNTER — Encounter: Payer: Self-pay | Admitting: Neurology

## 2015-04-30 VITALS — BP 150/65 | HR 62 | Ht <= 58 in | Wt 130.8 lb

## 2015-04-30 DIAGNOSIS — F03918 Unspecified dementia, unspecified severity, with other behavioral disturbance: Secondary | ICD-10-CM

## 2015-04-30 DIAGNOSIS — F0391 Unspecified dementia with behavioral disturbance: Secondary | ICD-10-CM
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
## 2015-04-30 NOTE — Patient Instructions (Addendum)
I had a long discussion with the patient as well as her son Dr. Newell CoralNudelman whom I spoke to over the phone. Patient's mild vascular dementia appears stable and her behavioral agitation seems to have actually improved after Namenda was tapered off and discontinued. She seems stable on the current dose of Depakote 125 mg 4 times daily and Aricept 5 mg a day. Continue present medication regime and use a walker for ambulation and fall and safety precautions.She seems to be recovering from her MCA branch infarct with hemorrhagic transformation reasonably well but has significant  Right hand weakness and mild dementia. She is not a candidate for carotid revascularization given her moderate dementia as well as is not an anticoagulation candidate due to recent brain hemorrhage and dementia. Continue aspirin 81 mg daily for stroke prevention and aggressive control of risk factors with blood pressure goal below 130/90, lipids with LDL cholesterol goal below 70 mg percent and diabetes with hemoglobin A1c goal below 6.5%. Return for follow-up in a year or call earlier if necessary.

## 2015-05-12 ENCOUNTER — Encounter: Payer: Self-pay | Admitting: Adult Health

## 2015-05-12 ENCOUNTER — Non-Acute Institutional Stay: Payer: Medicare Other | Admitting: Adult Health

## 2015-05-12 DIAGNOSIS — F0151 Vascular dementia with behavioral disturbance: Secondary | ICD-10-CM

## 2015-05-12 DIAGNOSIS — F01518 Vascular dementia, unspecified severity, with other behavioral disturbance: Secondary | ICD-10-CM

## 2015-05-12 NOTE — Progress Notes (Signed)
This encounter was created in error - please disregard.
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
## 2015-05-15 DIAGNOSIS — R319 Hematuria, unspecified: Secondary | ICD-10-CM | POA: Diagnosis not present

## 2015-05-15 DIAGNOSIS — N390 Urinary tract infection, site not specified: Secondary | ICD-10-CM | POA: Diagnosis not present

## 2015-05-15 DIAGNOSIS — R300 Dysuria: Secondary | ICD-10-CM | POA: Diagnosis not present

## 2015-06-10 DIAGNOSIS — E0822 Diabetes mellitus due to underlying condition with diabetic chronic kidney disease: Secondary | ICD-10-CM | POA: Diagnosis not present

## 2015-06-10 DIAGNOSIS — E039 Hypothyroidism, unspecified: Secondary | ICD-10-CM | POA: Diagnosis not present

## 2015-06-10 DIAGNOSIS — E785 Hyperlipidemia, unspecified: Secondary | ICD-10-CM | POA: Diagnosis not present

## 2015-06-10 LAB — BASIC METABOLIC PANEL
BUN: 26 mg/dL — AB (ref 4–21)
Creatinine: 1.2 mg/dL — AB (ref 0.5–1.1)
Glucose: 220 mg/dL
Potassium: 4.5 mmol/L (ref 3.4–5.3)
Sodium: 141 mmol/L (ref 137–147)

## 2015-06-10 LAB — HEPATIC FUNCTION PANEL
ALT: 15 U/L (ref 7–35)
AST: 15 U/L (ref 13–35)
Alkaline Phosphatase: 67 U/L (ref 25–125)
Bilirubin, Total: 0.4 mg/dL

## 2015-06-10 LAB — LIPID PANEL
Cholesterol: 148 mg/dL (ref 0–200)
HDL: 54 mg/dL (ref 35–70)
LDL Cholesterol: 64 mg/dL
Triglycerides: 153 mg/dL (ref 40–160)

## 2015-06-10 LAB — CBC AND DIFFERENTIAL
HCT: 43 % (ref 36–46)
Hemoglobin: 13 g/dL (ref 12.0–16.0)
Platelets: 242 10*3/uL (ref 150–399)
WBC: 10.8 10^3/mL

## 2015-06-10 LAB — HEMOGLOBIN A1C: Hemoglobin A1C: 7.2

## 2015-06-11 ENCOUNTER — Encounter: Payer: Self-pay | Admitting: Internal Medicine

## 2015-06-11 ENCOUNTER — Non-Acute Institutional Stay: Payer: Medicare Other | Admitting: Internal Medicine

## 2015-06-11 VITALS — BP 120/60 | HR 64 | Temp 99.3°F | Wt 132.0 lb

## 2015-06-11 DIAGNOSIS — E039 Hypothyroidism, unspecified: Secondary | ICD-10-CM

## 2015-06-11 DIAGNOSIS — M545 Low back pain, unspecified: Secondary | ICD-10-CM

## 2015-06-11 DIAGNOSIS — E118 Type 2 diabetes mellitus with unspecified complications: Secondary | ICD-10-CM

## 2015-06-11 DIAGNOSIS — F0151 Vascular dementia with behavioral disturbance: Secondary | ICD-10-CM | POA: Diagnosis not present

## 2015-06-11 DIAGNOSIS — I10 Essential (primary) hypertension: Secondary | ICD-10-CM

## 2015-06-11 DIAGNOSIS — I480 Paroxysmal atrial fibrillation: Secondary | ICD-10-CM

## 2015-06-11 NOTE — Progress Notes (Signed)
[facility]   Place of Service:  Clinic (12)  Provider: Khristian Phillippi L. Renato Gailseed, D.O., C.M.D.  Code Status: DNR Goals of Care:  Advanced Directives 06/11/2015  Does patient have an advance directive? Yes  Type of Advance Directive Out of facility DNR (pink MOST or yellow form);Healthcare Power of Attorney  Copy of advanced directive(s) in chart? Yes  Pre-existing out of facility DNR order (yellow form or pink MOST form) Yellow form placed in chart (order not valid for inpatient use)     Chief Complaint  Patient presents with  . Medical Management of Chronic Issues    follow-up, DIL Irving Burtonmily    HPI: Patient is a 82 y.o. female seen today for medical management of chronic diseases.    She is feeling well.  Not complaining of pain.    Having some indigestion this am.  Does not have items outside of the [facility] food.  Is a little nauseous.  Was a  It anxious about her appt.    No difficulty reported with behavior since one month ago.  On aricept and depakote.  No longer having difficulty with sleep and nightmares.  She got very upset when I mentioned that Amy keeps track of her (her nurse).  She says I'm being condescending and she has the same brain she's always had.  Past Medical History  Diagnosis Date  . Pure hypercholesterolemia   . Hyperlipidemia     Tolerating medication well as of 11/27/12  . Hypothyroidism   . Carpal tunnel syndrome   . Benign hypertensive heart disease without heart failure   . Coronary atherosclerosis of native coronary artery   . Spinal stenosis of lumbar region   . Osteoporosis, senile   . Postsurgical percutaneous transluminal coronary angioplasty status   . Essential hypertension, benign   . Benign hypertensive kidney disease with chronic kidney disease stage I through stage IV, or unspecified   . Diabetes mellitus with chronic kidney disease (HCC)   . NSTEMI (non-ST elevated myocardial infarction) (HCC) 09/21/09    BM  stent RCA  . Atrial fibrillation (HCC)   . Tachy-brady syndrome (HCC)     s/p PPM, battery change 08/01/09  . Sinoatrial node dysfunction (HCC)   . Chronic anemia   . Osteoarthritis   . H/O echocardiogram 07/19/09    EF = 60-65%  . Hyperglycemia     Mild  . Microalbuminuria 02/2010  . Vitamin D deficiency 10/2011  . Chronic renal disease, stage III   . Chronic anemia   . Spinal stenosis   . Aortic sclerosis [facility](HCC)     Past Surgical History  Procedure Laterality Date  . Carpal tunnel release Right 02/07/09  . Vesicovaginal fistula closure w/ tah    . Oophorectomy      With hysterectomy. One ovary removed-fibroids.  . Pacemaker insertion  2004    Gen change 08/04/09 by Dr. Amil AmenEdmunds. New pacing generator is a Medtronic EnRhythm, model D1939726#[id number], serial [id number] H  . Pacemaker generator change  08/04/09    By Dr. Amil AmenEdmunds. New pacing generator is a Medtronic EnRhythm, model D1939726#[id number], serial [id number] H  . Percutaneous coronary stent intervention (pci-s)  05/08/02    (3 tandem Cypher stent), proximal and mid LAD  . Coronary angioplasty with stent placement  09/21/09    By Dr. Amil AmenEdmunds. ACS, NSTEMI - BM stent RCA    Allergies  Allergen Reactions  . Fosamax [Alendronate Sodium]   . Other Other (See Comments)    Allergic to  crab meat, but no shellfish allergy (can eat lobster, shrimp, etc.)  Crab meat causes angioedema of the throat.  Marland Kitchen Penicillins   . Sulfa Antibiotics       Medication List       This list is accurate as of: 06/11/15  9:20 AM.  Always use your most recent med list.               acetaminophen 325 MG tablet  Commonly known as:  TYLENOL  Take 650 mg by mouth. Take 2 tablets every 6 hours as needed for pain     aspirin EC 81 MG tablet  Take 81 mg by mouth daily.     CARTIA XT 120 MG 24 hr capsule  Generic drug:  diltiazem  Take 120 mg by mouth.     CRESTOR 5 MG tablet  Generic drug:  rosuvastatin  Take 5 mg by mouth at bedtime.     divalproex 125 MG  capsule  Commonly known as:  DEPAKOTE SPRINKLE  2 (two) times daily.     donepezil 5 MG tablet  Commonly known as:  ARICEPT  Take 5 mg by mouth at bedtime.     ezetimibe 10 MG tablet  Commonly known as:  ZETIA  Take 10 mg by mouth at bedtime.     levothyroxine 25 MCG tablet  Commonly known as:  SYNTHROID, LEVOTHROID  Take 25 mcg by mouth daily before breakfast.     MULTAQ 400 MG tablet  Generic drug:  dronedarone  TAKE 1 TABLET BY MOUTH TWICE DAILY     polyethylene glycol packet  Commonly known as:  MIRALAX / GLYCOLAX  Take 17 g by mouth daily.     sodium chloride 0.65 % Soln nasal spray  Commonly known as:  OCEAN  Place 2 sprays into both nostrils 2 (two) times daily as needed for congestion.        Review of Systems:  Review of Systems  Constitutional: Negative for fever and chills.  HENT: Positive for hearing loss. Negative for congestion.   Eyes: Negative for blurred vision.  Respiratory: Negative for shortness of breath.   Cardiovascular: Negative for chest pain.  Gastrointestinal: Positive for heartburn. Negative for abdominal pain.  Genitourinary: Negative for dysuria.  Musculoskeletal: Positive for falls.  Skin: Negative for rash.  Neurological: Negative for dizziness and loss of consciousness.  Endo/Heme/Allergies: Bruises/bleeds easily.  Psychiatric/Behavioral: Positive for depression and memory loss. Negative for hallucinations. The patient is nervous/anxious. The patient does not have insomnia.        No nightmares; easily agitated    Health Maintenance  Topic Date Due  . FOOT EXAM  11/14/1928  . OPHTHALMOLOGY EXAM  11/14/1928  . URINE MICROALBUMIN  11/14/1930  . DEXA SCAN  11/13/1985  . PNA vac Low Risk Adult (2 of 2 - PCV13) 03/24/2006  . TETANUS/TDAP  03/19/2014  . HEMOGLOBIN A1C  03/26/2015  . INFLUENZA VACCINE  08/19/2015  . ZOSTAVAX  Completed    Physical Exam: Filed Vitals:   06/11/15 0908  BP: 120/60  Pulse: 64  Temp: 99.3 F (37.4  C)  TempSrc: Oral  Weight: 132 lb (59.875 kg)  SpO2: 94%   Body mass index is 29.61 kg/(m^2). Physical Exam  Constitutional: She appears well-developed and well-nourished. No distress.  Cardiovascular: Normal rate, regular rhythm and normal heart sounds.   Pulmonary/Chest: Effort normal and breath sounds normal. No respiratory distress.  Abdominal: Soft. Bowel sounds are normal. She exhibits no distension. There is  no tenderness.  Musculoskeletal: Normal range of motion.  Shuffling gait with walker  Neurological: She is alert.  Skin: Skin is warm and dry.  Psychiatric:  Agitated and yelling after discussion about help from nursing staff    Labs reviewed: Basic Metabolic Panel:  Recent Labs  16/10/96 06/10/15 0304  NA 139 141  K 4.5 4.5  BUN 22* 26*  CREATININE 1.3* 1.2*  TSH 4.00  --    Liver Function Tests:  Recent Labs  10/03/14 06/10/15 0304  AST 25 15  ALT 37* 15  ALKPHOS  --  67   No results for input(s): LIPASE, AMYLASE in the last 8760 hours. No results for input(s): AMMONIA in the last 8760 hours. CBC:  Recent Labs  09/24/14 10/03/14 06/10/15 0304  WBC 11.8 11.9 10.8  HGB 14.8 14.1 13.0  HCT 45 41 43  PLT 272 239 242   Lipid Panel:  Recent Labs  09/24/14 06/10/15 0304  CHOL 158 148  HDL 67 54  LDLCALC 67 64  TRIG 122 153   Lab Results  Component Value Date   HGBA1C 7.2 06/10/2015    Assessment/Plan 1. Type 2 diabetes mellitus with complication, without long-term current use of insulin (HCC) -hba1c improved to 7.2 from 7.7 -advised to eat sugar-free desserts or fruit instead of the regular desserts  -she says she will do this but doesn't know about sugar free desserts (staff will need to assist with this)  2. Hypothyroidism, unspecified hypothyroidism type -cont current synthroid and monitor  3. Vascular dementia, with behavioral disturbance -gets very agitated easily and thinks people are condescending her when mentioning that she  gets help with anything (though this is true b/c she needs prompts and help for bathing, dressing, grooming) -staff did not complain to me at all about her behavior in the past month since Joes saw her and did not change her meds--continues on low dose aricept with depakote  4. Essential hypertension, benign -bp controlled with current regimen, cont same, no dizziness  5. Midline low back pain without sciatica -denies any pain whatsoever  6. Paroxysmal atrial fibrillation (HCC) -regular today, cont current regimen  Labs/tests ordered:  hba1c before next visit Next appt:  3 mos med mgt with hba1c before Deloma Spindle L. Rob Mciver, D.O. Geriatrics [facility] [address], [place] [address] Cell Phone (Mon-Fri 8am-5pm):  [phone] On Call:  [phone] & follow prompts after 5pm & weekends Office Phone:  [phone] Office Fax:  [phone]

## 2015-09-09 DIAGNOSIS — E0865 Diabetes mellitus due to underlying condition with hyperglycemia: Secondary | ICD-10-CM | POA: Diagnosis not present

## 2015-09-09 LAB — HEMOGLOBIN A1C: Hemoglobin A1C: 7.8

## 2015-09-10 ENCOUNTER — Encounter: Payer: Self-pay | Admitting: Internal Medicine

## 2015-09-10 ENCOUNTER — Non-Acute Institutional Stay: Payer: Medicare Other | Admitting: Internal Medicine

## 2015-09-10 VITALS — BP 120/60 | HR 87 | Temp 98.8°F | Wt 134.0 lb

## 2015-09-10 DIAGNOSIS — E118 Type 2 diabetes mellitus with unspecified complications: Secondary | ICD-10-CM | POA: Diagnosis not present

## 2015-09-10 DIAGNOSIS — E039 Hypothyroidism, unspecified: Secondary | ICD-10-CM | POA: Diagnosis not present

## 2015-09-10 DIAGNOSIS — I2510 Atherosclerotic heart disease of native coronary artery without angina pectoris: Secondary | ICD-10-CM

## 2015-09-10 DIAGNOSIS — F0151 Vascular dementia with behavioral disturbance: Secondary | ICD-10-CM | POA: Diagnosis not present

## 2015-09-10 DIAGNOSIS — I480 Paroxysmal atrial fibrillation: Secondary | ICD-10-CM

## 2015-09-10 NOTE — Progress Notes (Signed)
Location:   [facility]   Place of Service:  Clinic (12)  Provider: Oriah Leinweber L. Renato Gailseed, D.O., C.M.D.  Code Status: DNR  Goals of Care:  Advanced Directives 09/10/2015  Does patient have an advance directive? Yes  Type of Advance Directive Out of facility DNR (pink MOST or yellow form);Living will;Healthcare Power of Attorney  Does patient want to make changes to advanced directive? -  Copy of advanced directive(s) in chart? Yes  Would patient like information on creating an advanced directive? -  Pre-existing out of facility DNR order (yellow form or pink MOST form) Yellow form placed in chart (order not valid for inpatient use)     Chief Complaint  Patient presents with  . Medical Management of Chronic Issues    3 mth follow-up    HPI: Patient is a 82 y.o. female seen today for medical management of chronic diseases including her dementia with prior stroke, DMII, hearing loss, and hypothyroidism.    She is eating well (desserts twice a day) and hba1c has gone up to 7.8 from 7.2 in May.    She continues to attend all activities but bingo.    Staff have not reported any increase in behaviors.  Her daughter in law had no reports either.    She is very HOH and she gets frustrated when she cannot hear what is being said.    She denies any pain, is sleeping well, bowels are moving, no edema, chest pain or shortness of breath.    She was just prescribed nystatin powder for a yeast rash beneath her breasts which is improving.  She denies pain or itching.  Past Medical History:  Diagnosis Date  . Aortic sclerosis (HCC)   . Atrial fibrillation (HCC)   . Benign hypertensive heart disease without heart failure   . Benign hypertensive kidney disease with chronic kidney disease stage I through stage IV, or unspecified   . Carpal tunnel syndrome   . Chronic anemia   . Chronic anemia   . Chronic renal disease, stage III   . Coronary atherosclerosis of native coronary artery   .  Diabetes mellitus with chronic kidney disease (HCC)   . Essential hypertension, benign   . H/O echocardiogram 07/19/09   EF = 60-65%  . Hyperglycemia    Mild  . Hyperlipidemia    Tolerating medication well as of 11/27/12  . Hypothyroidism   . Microalbuminuria 02/2010  . NSTEMI (non-ST elevated myocardial infarction) (HCC) 09/21/09   BM stent RCA  . Osteoarthritis   . Osteoporosis, senile   . Postsurgical percutaneous transluminal coronary angioplasty status   . Pure hypercholesterolemia   . Sinoatrial node dysfunction (HCC)   . Spinal stenosis   . Spinal stenosis of lumbar region   . Tachy-brady syndrome (HCC)    s/p PPM, battery change 08/01/09  . Vitamin D deficiency 10/2011    Past Surgical History:  Procedure Laterality Date  . CARPAL TUNNEL RELEASE Right 02/07/09  . CORONARY ANGIOPLASTY WITH STENT PLACEMENT  09/21/09   By Dr. Amil AmenEdmunds. ACS, NSTEMI - BM stent RCA  . OOPHORECTOMY     With hysterectomy. One ovary removed-fibroids.  Marland Kitchen. PACEMAKER GENERATOR CHANGE  08/04/09   By Dr. Amil AmenEdmunds. New pacing generator is a Medtronic EnRhythm, model D1939726#[id number], serial [id number] H  . PACEMAKER INSERTION  2004   Gen change 08/04/09 by Dr. Amil AmenEdmunds. New pacing generator is a Medtronic EnRhythm, model D1939726#[id number], serial [id number]  . PERCUTANEOUS CORONARY STENT INTERVENTION (PCI-S)  05/08/02   (  3 tandem Cypher stent), proximal and mid LAD  . VESICOVAGINAL FISTULA CLOSURE W/ TAH      Allergies  Allergen Reactions  . Fosamax [Alendronate Sodium]   . Other Other (See Comments)    Allergic to crab meat, but no shellfish allergy (can eat lobster, shrimp, etc.)  Crab meat causes angioedema of the throat.  Marland Kitchen. Penicillins   . Sulfa Antibiotics       Medication List       Accurate as of 09/10/15 11:10 AM. Always use your most recent med list.          acetaminophen 325 MG tablet Commonly known as:  TYLENOL Take 650 mg by mouth. Take 2 tablets every 6 hours as needed for pain   aspirin EC 81 MG  tablet Take 81 mg by mouth daily.   CARTIA XT 120 MG 24 hr capsule Generic drug:  diltiazem Take 120 mg by mouth.   CRESTOR 5 MG tablet Generic drug:  rosuvastatin Take 5 mg by mouth at bedtime.   divalproex 125 MG capsule Commonly known as:  DEPAKOTE SPRINKLE 2 (two) times daily.   donepezil 5 MG tablet Commonly known as:  ARICEPT Take 5 mg by mouth at bedtime.   ezetimibe 10 MG tablet Commonly known as:  ZETIA Take 10 mg by mouth at bedtime.   levothyroxine 25 MCG tablet Commonly known as:  SYNTHROID, LEVOTHROID Take 25 mcg by mouth daily before breakfast.   MULTAQ 400 MG tablet Generic drug:  dronedarone TAKE 1 TABLET BY MOUTH TWICE DAILY   nystatin powder Commonly known as:  MYCOSTATIN/NYSTOP Apply topically 2 (two) times daily. Under breasts   polyethylene glycol packet Commonly known as:  MIRALAX / GLYCOLAX Take 17 g by mouth daily.   ranitidine 150 MG capsule Commonly known as:  ZANTAC Take 150 mg by mouth daily as needed for heartburn.   sodium chloride 0.65 % Soln nasal spray Commonly known as:  OCEAN Place 2 sprays into both nostrils 2 (two) times daily as needed for congestion.       Review of Systems:  Review of Systems  Constitutional: Negative for chills, fever and malaise/fatigue.  HENT: Positive for hearing loss.   Eyes: Negative for blurred vision.  Respiratory: Negative for cough and shortness of breath.   Cardiovascular: Negative for chest pain and palpitations.  Gastrointestinal: Negative for abdominal pain, blood in stool, constipation and melena.  Genitourinary: Negative for dysuria.  Musculoskeletal: Positive for falls. Negative for back pain.  Skin: Negative for itching and rash.  Neurological: Negative for dizziness, loss of consciousness and weakness.  Psychiatric/Behavioral: Positive for memory loss.       Agitation at times    Health Maintenance  Topic Date Due  . FOOT EXAM  11/14/1928  . OPHTHALMOLOGY EXAM  11/14/1928    . URINE MICROALBUMIN  11/14/1930  . DEXA SCAN  11/13/1985  . PNA vac Low Risk Adult (2 of 2 - PCV13) 03/24/2006  . TETANUS/TDAP  03/19/2014  . INFLUENZA VACCINE  08/19/2015  . HEMOGLOBIN A1C  12/11/2015  . ZOSTAVAX  Completed    Physical Exam: Vitals:   09/10/15 1103  BP: 120/60  Pulse: 87  Temp: 98.8 F (37.1 C)  TempSrc: Oral  SpO2: 95%  Weight: 134 lb (60.8 kg)   Body mass index is 30.04 kg/m. Physical Exam  Constitutional: She is oriented to person, place, and time. She appears well-developed and well-nourished. No distress.  HENT:  Very HOH  Cardiovascular: Normal rate,  regular rhythm, normal heart sounds and intact distal pulses.   Pulmonary/Chest: Effort normal and breath sounds normal. No respiratory distress.  Abdominal: Bowel sounds are normal.  Musculoskeletal: Normal range of motion.  Walking with rollator walker  Neurological: She is alert and oriented to person, place, and time.  Skin:  Minor erythema beneath breasts--powder is under them    Labs reviewed: Basic Metabolic Panel:  Recent Labs  16/10/96 06/10/15 0304  NA 139 141  K 4.5 4.5  BUN 22* 26*  CREATININE 1.3* 1.2*  TSH 4.00  --    Liver Function Tests:  Recent Labs  10/03/14 06/10/15 0304  AST 25 15  ALT 37* 15  ALKPHOS  --  67   No results for input(s): LIPASE, AMYLASE in the last 8760 hours. No results for input(s): AMMONIA in the last 8760 hours. CBC:  Recent Labs  09/24/14 10/03/14 06/10/15 0304  WBC 11.8 11.9 10.8  HGB 14.8 14.1 13.0  HCT 45 41 43  PLT 272 239 242   Lipid Panel:  Recent Labs  09/24/14 06/10/15 0304  CHOL 158 148  HDL 67 54  LDLCALC 67 64  TRIG 122 153   Lab Results  Component Value Date   HGBA1C 7.2 06/10/2015   Assessment/Plan 1. Type 2 diabetes mellitus with complication, without long-term current use of insulin (HCC) -control has worsened, but still at goal for a 82 yo with comfort goals due to her vascular dementia -now hba1c  7.8  2. Hypothyroidism, unspecified hypothyroidism type -last tsh wnl, cont current levothyroxine and monitor  3. Vascular dementia, with behavioral disturbance -cont aricept only low dose--has been stable so I'm hesitant to d/c (could worsen behavior) -cont depakote sprinkles which have helped behavior  4. CAD in native artery -cont baby asa, statin, bp control  5. Paroxysmal atrial fibrillation (HCC) -cont cartia  Labs/tests ordered: cbc, bmp, hba1c before Next appt:  01/07/2016  Sharnetta Gielow L. Derl Abalos, D.O. Geriatrics [facility] [address], [place] [address] Cell Phone (Mon-Fri 8am-5pm):  [phone] On Call:  [phone] & follow prompts after 5pm & weekends Office Phone:  [phone] Office Fax:  [phone]

## 2015-09-11 DIAGNOSIS — B351 Tinea unguium: Secondary | ICD-10-CM | POA: Diagnosis not present

## 2015-09-24 ENCOUNTER — Non-Acute Institutional Stay: Payer: Medicare Other | Admitting: Internal Medicine

## 2015-09-24 ENCOUNTER — Encounter: Payer: Self-pay | Admitting: Internal Medicine

## 2015-09-24 VITALS — BP 122/74 | HR 78 | Temp 99.0°F | Wt 136.0 lb

## 2015-09-24 DIAGNOSIS — B372 Candidiasis of skin and nail: Secondary | ICD-10-CM | POA: Diagnosis not present

## 2015-09-24 DIAGNOSIS — I2510 Atherosclerotic heart disease of native coronary artery without angina pectoris: Secondary | ICD-10-CM | POA: Diagnosis not present

## 2015-09-24 DIAGNOSIS — N63 Unspecified lump in breast: Secondary | ICD-10-CM

## 2015-09-24 DIAGNOSIS — N6320 Unspecified lump in the left breast, unspecified quadrant: Secondary | ICD-10-CM

## 2015-09-24 NOTE — Progress Notes (Signed)
[facility] Location:   [place]    Place of Service:  clinic  Provider: Ferdinand Revoir L. Renato Gailseed, D.O., C.M.D.  Code Status: DNR Goals of Care:  Advanced Directives 09/24/2015  Does patient have an advance directive? Yes  Type of Advance Directive Out of facility DNR (pink MOST or yellow form);Living will;Healthcare Power of Attorney  Does patient want to make changes to advanced directive? -  Copy of advanced directive(s) in chart? Yes  Would patient like information on creating an advanced directive? -  Pre-existing out of facility DNR order (yellow form or pink MOST form) Yellow form placed in chart (order not valid for inpatient use)     Chief Complaint  Patient presents with  . Acute Visit    rash, painful lump above her left breast    HPI: Patient is a 82 y.o. female seen today for an acute visit for rash beneath both breasts which is itchy and a painful lump above her left breast.  Not painful beneath breasts, just itchy.   Past Medical History:  Diagnosis Date  . Aortic sclerosis (HCC)   . Atrial fibrillation (HCC)   . Benign hypertensive heart disease without heart failure   . Benign hypertensive kidney disease with chronic kidney disease stage I through stage IV, or unspecified   . Carpal tunnel syndrome   . Chronic anemia   . Chronic anemia   . Chronic renal disease, stage III   . Coronary atherosclerosis of native coronary artery   . Diabetes mellitus with chronic kidney disease (HCC)   . Essential hypertension, benign   . H/O echocardiogram 07/19/09   EF = 60-65%  . Hyperglycemia    Mild  . Hyperlipidemia    Tolerating medication well as of 11/27/12  . Hypothyroidism   . Microalbuminuria 02/2010  . NSTEMI (non-ST elevated myocardial infarction) (HCC) 09/21/09   BM stent RCA  . Osteoarthritis   . Osteoporosis, senile   . Postsurgical percutaneous transluminal coronary angioplasty status   . Pure hypercholesterolemia   . Sinoatrial node dysfunction  (HCC)   . Spinal stenosis   . Spinal stenosis of lumbar region   . Tachy-brady syndrome (HCC)    s/p PPM, battery change 08/01/09  . Vitamin D deficiency 10/2011    Past Surgical History:  Procedure Laterality Date  . CARPAL TUNNEL RELEASE Right 02/07/09  . CORONARY ANGIOPLASTY WITH STENT PLACEMENT  09/21/09   By Dr. Amil AmenEdmunds. ACS, NSTEMI - BM stent RCA  . OOPHORECTOMY     With hysterectomy. One ovary removed-fibroids.  Marland Kitchen. PACEMAKER GENERATOR CHANGE  08/04/09   By Dr. Amil AmenEdmunds. New pacing generator is a Medtronic EnRhythm, model [id number], serial [id number]  . PACEMAKER INSERTION  2004   Gen change 08/04/09 by Dr. Amil AmenEdmunds. New pacing generator is a Medtronic EnRhythm, model [id number], serial [id number]  . PERCUTANEOUS CORONARY STENT INTERVENTION (PCI-S)  05/08/02   (3 tandem Cypher stent), proximal and mid LAD  . VESICOVAGINAL FISTULA CLOSURE W/ TAH      Allergies  Allergen Reactions  . Fosamax [Alendronate Sodium]   . Other Other (See Comments)    Allergic to crab meat, but no shellfish allergy (can eat lobster, shrimp, etc.)  Crab meat causes angioedema of the throat.  Marland Kitchen. Penicillins   . Sulfa Antibiotics       Medication List       Accurate as of 09/24/15  2:50 PM. Always use your most recent med list.  acetaminophen 325 MG tablet Commonly known as:  TYLENOL Take 650 mg by mouth. Take 2 tablets every 6 hours as needed for pain   aspirin EC 81 MG tablet Take 81 mg by mouth daily.   CARTIA XT 120 MG 24 hr capsule Generic drug:  diltiazem Take 120 mg by mouth.   CRESTOR 5 MG tablet Generic drug:  rosuvastatin Take 5 mg by mouth at bedtime.   divalproex 125 MG capsule Commonly known as:  DEPAKOTE SPRINKLE 2 (two) times daily.   donepezil 5 MG tablet Commonly known as:  ARICEPT Take 5 mg by mouth at bedtime.   ezetimibe 10 MG tablet Commonly known as:  ZETIA Take 10 mg by mouth at bedtime.   levothyroxine 25 MCG tablet Commonly known as:  SYNTHROID,  LEVOTHROID Take 25 mcg by mouth daily before breakfast.   MULTAQ 400 MG tablet Generic drug:  dronedarone TAKE 1 TABLET BY MOUTH TWICE DAILY   polyethylene glycol packet Commonly known as:  MIRALAX / GLYCOLAX Take 17 g by mouth daily.   ranitidine 150 MG capsule Commonly known as:  ZANTAC Take 150 mg by mouth daily as needed for heartburn.   sodium chloride 0.65 % Soln nasal spray Commonly known as:  OCEAN Place 2 sprays into both nostrils 2 (two) times daily as needed for congestion.       Review of Systems:  Review of Systems  Constitutional: Negative for chills and fever.  Skin: Positive for itching and rash.       Beneath bilateral breasts    Health Maintenance  Topic Date Due  . FOOT EXAM  11/14/1928  . OPHTHALMOLOGY EXAM  [birth year]14/1928  . URINE MICROALBUMIN  11/14/1930  . DEXA SCAN  11/13/1985  . PNA vac Low Risk Adult (2 of 2 - PCV13) 03/24/2006  . TETANUS/TDAP  03/19/2014  . INFLUENZA VACCINE  08/19/2015  . HEMOGLOBIN A1C  12/11/2015  . ZOSTAVAX  Completed    Physical Exam: Vitals:   09/24/15 1353  BP: 122/74  Pulse: 78  Temp: 99 F (37.2 C)  TempSrc: Oral  SpO2: 95%  Weight: 136 lb (61.7 kg)   Body mass index is 30.49 kg/m. Physical Exam  Constitutional: She appears well-developed and well-nourished. No distress.  Pulmonary/Chest: Right breast exhibits no inverted nipple, no mass, no nipple discharge, no skin change and no tenderness. Left breast exhibits tenderness. Left breast exhibits no inverted nipple, no mass, no nipple discharge and no skin change.  Palpable pacemaker left upper chest and plapable lead at area of tenderness (as pointed out by patient); no other masses felt in breasts or axillae  Musculoskeletal: Normal range of motion.  Walks with walker  Neurological: She is alert.  Skin: Skin is warm and dry.  Papular rash beneath bilateral breasts (not improving with powder)    Labs reviewed: Basic Metabolic Panel:  Recent Labs   06/10/15 0304  NA 141  K 4.5  BUN 26*  CREATININE 1.2*   Liver Function Tests:  Recent Labs  10/03/14 06/10/15 0304  AST 25 15  ALT 37* 15  ALKPHOS  --  67   No results for input(s): LIPASE, AMYLASE in the last 8760 hours. No results for input(s): AMMONIA in the last 8760 hours. CBC:  Recent Labs  10/03/14 06/10/15 0304  WBC 11.9 10.8  HGB 14.1 13.0  HCT 41 43  PLT 239 242   Lipid Panel:  Recent Labs  06/10/15 0304  CHOL 148  HDL 54  LDLCALC  64  TRIG 153   Lab Results  Component Value Date   HGBA1C 7.2 06/10/2015    Assessment/Plan 1. Yeast infection of the skin beneath breasts -will stop nystatin powder (begun 9/4 for this problem) and start nystatin cream instead bid until resolved, then prn for recurrence  2. Left breast mass -no true mass palpable--appears it is her pacemaker and a palpable lead beneath the skin at the area of tenderness (just inferior to the pacer scar)  Labs/tests ordered:  No new Next appt:  01/07/2016  Salli Bodin L. Aeralyn Barna, D.O. Geriatrics [facility] [address], [place] [address] Cell Phone (Mon-Fri 8am-5pm):  [phone] On Call:  [phone] & follow prompts after 5pm & weekends Office Phone:  [phone] Office Fax:  [phone]

## 2015-10-07 DIAGNOSIS — L304 Erythema intertrigo: Secondary | ICD-10-CM | POA: Diagnosis not present

## 2015-10-07 DIAGNOSIS — L821 Other seborrheic keratosis: Secondary | ICD-10-CM | POA: Diagnosis not present

## 2015-11-06 DIAGNOSIS — Z23 Encounter for immunization: Secondary | ICD-10-CM | POA: Diagnosis not present

## 2016-01-05 DIAGNOSIS — R739 Hyperglycemia, unspecified: Secondary | ICD-10-CM | POA: Diagnosis not present

## 2016-01-05 DIAGNOSIS — Z823 Family history of stroke: Secondary | ICD-10-CM | POA: Diagnosis not present

## 2016-01-05 LAB — CBC AND DIFFERENTIAL
HCT: 48 % — AB (ref 36–46)
Hemoglobin: 15 g/dL (ref 12.0–16.0)
Platelets: 252 10*3/uL (ref 150–399)
WBC: 11.3 10^3/mL

## 2016-01-05 LAB — BASIC METABOLIC PANEL
BUN: 26 mg/dL — AB (ref 4–21)
Creatinine: 1.5 mg/dL — AB (ref 0.5–1.1)
Glucose: 250 mg/dL
Potassium: 5.1 mmol/L (ref 3.4–5.3)
Sodium: 140 mmol/L (ref 137–147)

## 2016-01-05 LAB — HEMOGLOBIN A1C: Hemoglobin A1C: 8.7

## 2016-01-07 ENCOUNTER — Encounter: Payer: Self-pay | Admitting: Internal Medicine

## 2016-01-07 ENCOUNTER — Non-Acute Institutional Stay: Payer: Medicare Other | Admitting: Internal Medicine

## 2016-01-07 VITALS — BP 140/68 | HR 66 | Temp 98.4°F | Ht <= 58 in | Wt 140.0 lb

## 2016-01-07 DIAGNOSIS — I2510 Atherosclerotic heart disease of native coronary artery without angina pectoris: Secondary | ICD-10-CM

## 2016-01-07 DIAGNOSIS — I69351 Hemiplegia and hemiparesis following cerebral infarction affecting right dominant side: Secondary | ICD-10-CM

## 2016-01-07 DIAGNOSIS — I10 Essential (primary) hypertension: Secondary | ICD-10-CM

## 2016-01-07 DIAGNOSIS — E118 Type 2 diabetes mellitus with unspecified complications: Secondary | ICD-10-CM

## 2016-01-07 DIAGNOSIS — E039 Hypothyroidism, unspecified: Secondary | ICD-10-CM

## 2016-01-07 DIAGNOSIS — E7800 Pure hypercholesterolemia, unspecified: Secondary | ICD-10-CM

## 2016-01-07 NOTE — Progress Notes (Signed)
Location:  Medical illustrator of Service:  Clinic (12)  Provider: Regine Christian L. Renato Gails, D.O., C.M.D.  Code Status: DNR  Goals of Care:  Advanced Directives 01/07/2016  Does Patient Have a Medical Advance Directive? Yes  Type of Advance Directive Out of facility DNR (pink MOST or yellow form);Healthcare Power of Attorney  Does patient want to make changes to medical advance directive? -  Copy of Healthcare Power of Attorney in Chart? Yes  Would patient like information on creating a medical advance directive? -  Pre-existing out of facility DNR order (yellow form or pink MOST form) Yellow form placed in chart (order not valid for inpatient use)     Chief Complaint  Patient presents with  . Medical Management of Chronic Issues    4 mth follow-up    HPI: Patient is a 83 y.o. female seen today for medical management of chronic diseases.    DMII:  hba1c up to 8.7 this time--reviewed with her daughter in law and her son on the phone.  We discussed that at that level it increases her risk of heart, visual complications so I'd like it lower.  She does not refuse desserts when offered or limit her intake anymore like she did when she was responsible for her food choices.  She says she can eat fruit instead of dessert, but is unlikely to remember this discussion later on to follow through on it.  She celebrated her 89th birthday and had multiple cakes since then (more than usual).  She has also gained more weight.    She feels well w/o complaints.  Denies any pain.  Continues to ambulate with her rollator walker.  As far as mood/behavior, she's been doing better off of her psychiatric medications except low dose aricept and depakote.  Hypothyroidism:   Lab Results  Component Value Date   TSH 4.00 09/24/2014  Needs recheck before next visit.  Prior stroke with hemiparesis: uses walker to get around.  Needs lipids rechecked on statin and zetia.  Past Medical History:    Diagnosis Date  . Aortic sclerosis   . Atrial fibrillation (HCC)   . Benign hypertensive heart disease without heart failure   . Benign hypertensive kidney disease with chronic kidney disease stage I through stage IV, or unspecified(403.10)   . Carpal tunnel syndrome   . Chronic anemia   . Chronic anemia   . Chronic renal disease, stage III   . Coronary atherosclerosis of native coronary artery   . Diabetes mellitus with chronic kidney disease (HCC)   . Essential hypertension, benign   . H/O echocardiogram 07/19/09   EF = 60-65%  . Hyperglycemia    Mild  . Hyperlipidemia    Tolerating medication well as of 11/27/12  . Hypothyroidism   . Microalbuminuria 02/2010  . NSTEMI (non-ST elevated myocardial infarction) (HCC) 09/21/09   BM stent RCA  . Osteoarthritis   . Osteoporosis, senile   . Postsurgical percutaneous transluminal coronary angioplasty status   . Pure hypercholesterolemia   . Sinoatrial node dysfunction (HCC)   . Spinal stenosis   . Spinal stenosis of lumbar region   . Tachy-brady syndrome (HCC)    s/p PPM, battery change 08/01/09  . Vitamin D deficiency 10/2011    Past Surgical History:  Procedure Laterality Date  . CARPAL TUNNEL RELEASE Right 02/07/09  . CORONARY ANGIOPLASTY WITH STENT PLACEMENT  09/21/09   By Dr. Amil Amen. ACS, NSTEMI - BM stent RCA  . OOPHORECTOMY  With hysterectomy. One ovary removed-fibroids.  Marland Kitchen. PACEMAKER GENERATOR CHANGE  08/04/09   By Dr. Amil AmenEdmunds. New pacing generator is a Medtronic EnRhythm, model D1939726#[id number], serial [id number]  . PACEMAKER INSERTION  2004   Gen change 08/04/09 by Dr. Amil AmenEdmunds. New pacing generator is a Medtronic EnRhythm, model D1939726#[id number], serial [id number]  . PERCUTANEOUS CORONARY STENT INTERVENTION (PCI-S)  05/08/02   (3 tandem Cypher stent), proximal and mid LAD  . VESICOVAGINAL FISTULA CLOSURE W/ TAH      Allergies  Allergen Reactions  . Fosamax [Alendronate Sodium]   . Other Other (See Comments)    Allergic to crab  meat, but no shellfish allergy (can eat lobster, shrimp, etc.)  Crab meat causes angioedema of the throat.  Marland Kitchen. Penicillins   . Sulfa Antibiotics     Allergies as of 01/07/2016      Reactions   Fosamax [alendronate Sodium]    Other Other (See Comments)   Allergic to crab meat, but no shellfish allergy (can eat lobster, shrimp, etc.)  Crab meat causes angioedema of the throat.   Penicillins    Sulfa Antibiotics       Medication List       Accurate as of 01/07/16 11:07 AM. Always use your most recent med list.          acetaminophen 325 MG tablet Commonly known as:  TYLENOL Take 650 mg by mouth. Take 2 tablets every 6 hours as needed for pain   aspirin EC 81 MG tablet Take 81 mg by mouth daily.   CARTIA XT 120 MG 24 hr capsule Generic drug:  diltiazem Take 120 mg by mouth.   CRESTOR 5 MG tablet Generic drug:  rosuvastatin Take 5 mg by mouth at bedtime.   divalproex 125 MG capsule Commonly known as:  DEPAKOTE SPRINKLE 2 (two) times daily.   donepezil 5 MG tablet Commonly known as:  ARICEPT Take 5 mg by mouth at bedtime.   ezetimibe 10 MG tablet Commonly known as:  ZETIA Take 10 mg by mouth at bedtime.   levothyroxine 25 MCG tablet Commonly known as:  SYNTHROID, LEVOTHROID Take 25 mcg by mouth daily before breakfast.   MULTAQ 400 MG tablet Generic drug:  dronedarone TAKE 1 TABLET BY MOUTH TWICE DAILY   nystatin cream Commonly known as:  MYCOSTATIN Apply 1 application topically daily as needed (rash).   polyethylene glycol packet Commonly known as:  MIRALAX / GLYCOLAX Take 17 g by mouth daily.   ranitidine 150 MG capsule Commonly known as:  ZANTAC Take 150 mg by mouth daily as needed for heartburn.   sodium chloride 0.65 % Soln nasal spray Commonly known as:  OCEAN Place 2 sprays into both nostrils 2 (two) times daily as needed for congestion.       Review of Systems:  Review of Systems  Constitutional: Negative for chills, fever and  malaise/fatigue.  HENT: Positive for hearing loss.   Eyes: Negative for blurred vision.  Respiratory: Negative for shortness of breath.   Cardiovascular: Negative for chest pain and palpitations.  Gastrointestinal: Negative for abdominal pain, blood in stool, constipation and melena.  Genitourinary: Negative for dysuria.  Musculoskeletal: Negative for falls.  Skin: Negative for itching and rash.  Neurological: Negative for dizziness, loss of consciousness and weakness.  Endo/Heme/Allergies: Does not bruise/bleed easily.  Psychiatric/Behavioral: Positive for memory loss. Negative for depression. The patient is not nervous/anxious and does not have insomnia.     Health Maintenance  Topic Date Due  . FOOT EXAM  11/14/1928  . OPHTHALMOLOGY EXAM  11/14/1928  . URINE MICROALBUMIN  11[birth year]14/1930  . DEXA SCAN  11/13/1985  . PNA vac Low Risk Adult (2 of 2 - PCV13) 03/24/2006  . TETANUS/TDAP  03/19/2014  . HEMOGLOBIN A1C  12/11/2015  . INFLUENZA VACCINE  Completed  . ZOSTAVAX  Completed    Physical Exam: Vitals:   01/07/16 1056  BP: 140/68  Pulse: 66  Temp: 98.4 F (36.9 C)  TempSrc: Oral  SpO2: 95%  Weight: 140 lb (63.5 kg)  Height: 4\' 8"  (1.422 m)   Body mass index is 31.39 kg/m. Physical Exam  Constitutional: She is oriented to person, place, and time. She appears well-developed and well-nourished. No distress.  Cardiovascular: Normal rate, regular rhythm and intact distal pulses.   Murmur heard. Pulmonary/Chest: Effort normal and breath sounds normal. No respiratory distress.  Abdominal: Soft. Bowel sounds are normal.  Musculoskeletal: Normal range of motion. She exhibits no tenderness.  Walking with rollator walker  Neurological: She is alert and oriented to person, place, and time.  Skin: Skin is warm and dry. Capillary refill takes less than 2 seconds.  Psychiatric: She has a normal mood and affect.    Labs reviewed: Basic Metabolic Panel:  Recent Labs   06/10/15 0304 01/05/16 0700  NA 141 140  K 4.5 5.1  BUN 26* 26*  CREATININE 1.2* 1.5*   Liver Function Tests:  Recent Labs  06/10/15 0304  AST 15  ALT 15  ALKPHOS 67   No results for input(s): LIPASE, AMYLASE in the last 8760 hours. No results for input(s): AMMONIA in the last 8760 hours. CBC:  Recent Labs  06/10/15 0304 01/05/16 0700  WBC 10.8 11.3  HGB 13.0 15.0  HCT 43 48*  PLT 242 252   Lipid Panel:  Recent Labs  06/10/15 0304  CHOL 148  HDL 54  LDLCALC 64  TRIG 153   Lab Results  Component Value Date   HGBA1C 8.7 01/05/2016    Assessment/Plan 1. Type 2 diabetes mellitus with complication, without long-term current use of insulin (HCC) -needs improved control -counseled on fruit instead of desserts and order written for staff to encourage that exchange -not on meds or insulin -may need to add tradjenta if hba1c still over 8.5 in 3 mos  2. Pure hypercholesterolemia -lipids will be rechecked before next appt in 3 mos due to CAD and prior stroke -cont statin and zetia  3. Hemiparesis affecting right side as late effect of stroke (HCC) -cont use of rollator walker  4. CAD in native artery -cont secondary prevention of MI and CVA  5. Essential hypertension, benign -bp slightly elevated today, cont same cartia and monitor -she had hurried over and had been amid a bingo game and exercise before that  6. Acquired hypothyroidism -cont current levothyroxine and recheck tsh before next visit  Labs/tests ordered:  Tsh, flp, hba1c before Next appt:  04/07/2016  Braelynn Benning L. Skyelynn Rambeau, D.O. Geriatrics [facility] [address] [place], [place] [address] Cell Phone (Mon-Fri 8am-5pm):  [phone] On Call:  [phone] & follow prompts after 5pm & weekends Office Phone:  [phone] Office Fax:  [phone]

## 2016-01-27 DIAGNOSIS — R197 Diarrhea, unspecified: Secondary | ICD-10-CM | POA: Diagnosis not present

## 2016-02-12 DIAGNOSIS — B351 Tinea unguium: Secondary | ICD-10-CM | POA: Diagnosis not present

## 2016-04-05 ENCOUNTER — Encounter: Payer: Self-pay | Admitting: Internal Medicine

## 2016-04-05 DIAGNOSIS — E785 Hyperlipidemia, unspecified: Secondary | ICD-10-CM | POA: Diagnosis not present

## 2016-04-05 DIAGNOSIS — E039 Hypothyroidism, unspecified: Secondary | ICD-10-CM | POA: Diagnosis not present

## 2016-04-05 DIAGNOSIS — E118 Type 2 diabetes mellitus with unspecified complications: Secondary | ICD-10-CM | POA: Diagnosis not present

## 2016-04-05 DIAGNOSIS — E119 Type 2 diabetes mellitus without complications: Secondary | ICD-10-CM | POA: Diagnosis not present

## 2016-04-05 LAB — TSH: TSH: 5.95 u[IU]/mL — AB (ref 0.41–5.90)

## 2016-04-05 LAB — HEMOGLOBIN A1C: Hemoglobin A1C: 7.6

## 2016-04-05 LAB — LIPID PANEL
Cholesterol: 135 mg/dL (ref 0–200)
HDL: 48 mg/dL (ref 35–70)
LDL Cholesterol: 60 mg/dL
Triglycerides: 137 mg/dL (ref 40–160)

## 2016-04-06 ENCOUNTER — Encounter: Payer: Self-pay | Admitting: Internal Medicine

## 2016-04-07 ENCOUNTER — Non-Acute Institutional Stay: Payer: Medicare Other | Admitting: Internal Medicine

## 2016-04-07 ENCOUNTER — Encounter: Payer: Self-pay | Admitting: Internal Medicine

## 2016-04-07 VITALS — BP 158/80 | HR 72 | Temp 98.0°F | Wt 135.0 lb

## 2016-04-07 DIAGNOSIS — I2510 Atherosclerotic heart disease of native coronary artery without angina pectoris: Secondary | ICD-10-CM | POA: Diagnosis not present

## 2016-04-07 DIAGNOSIS — H6121 Impacted cerumen, right ear: Secondary | ICD-10-CM

## 2016-04-07 DIAGNOSIS — E039 Hypothyroidism, unspecified: Secondary | ICD-10-CM | POA: Diagnosis not present

## 2016-04-07 DIAGNOSIS — Z23 Encounter for immunization: Secondary | ICD-10-CM | POA: Diagnosis not present

## 2016-04-07 DIAGNOSIS — F01518 Vascular dementia, unspecified severity, with other behavioral disturbance: Secondary | ICD-10-CM

## 2016-04-07 DIAGNOSIS — F0151 Vascular dementia with behavioral disturbance: Secondary | ICD-10-CM | POA: Diagnosis not present

## 2016-04-07 DIAGNOSIS — H903 Sensorineural hearing loss, bilateral: Secondary | ICD-10-CM

## 2016-04-07 DIAGNOSIS — E118 Type 2 diabetes mellitus with unspecified complications: Secondary | ICD-10-CM | POA: Diagnosis not present

## 2016-04-07 NOTE — Progress Notes (Signed)
[facility]   Place of Service:  Clinic (12)  Provider: Anahli Arvanitis L. Renato Gailseed, D.O., C.M.D.  Code Status: DNR Goals of Care:  Advanced Directives 04/07/2016  Does Patient Have a Medical Advance Directive? Yes  Type of Advance Directive Out of facility DNR (pink MOST or yellow form);Healthcare Power of Attorney  Does patient want to make changes to medical advance directive? -  Copy of Healthcare Power of Attorney in Chart? Yes  Would patient like information on creating a medical advance directive? -  Pre-existing out of facility DNR order (yellow form or pink MOST form) Yellow form placed in chart (order not valid for inpatient use)   Chief Complaint  Patient presents with  . Medical Management of Chronic Issues    3mth follow-up    HPI: Patient is a 83 y.o. female seen today for medical management of chronic diseases.     Past Medical History:  Diagnosis Date  . Aortic sclerosis   . Atrial fibrillation (HCC)   . Benign hypertensive heart disease without heart failure   . Benign hypertensive kidney disease with chronic kidney disease stage I through stage IV, or unspecified(403.10)   . Carpal tunnel syndrome   . Chronic anemia   . Chronic anemia   . Chronic renal disease, stage III   . Coronary atherosclerosis of native coronary artery   . Diabetes mellitus with chronic kidney disease (HCC)   . Essential hypertension, benign   . H/O echocardiogram 07/19/09   EF = 60-65%  . Hyperglycemia    Mild  . Hyperlipidemia    Tolerating medication well as of 11/27/12  . Hypothyroidism   . Microalbuminuria 02/2010  . NSTEMI (non-ST elevated myocardial infarction) (HCC) 09/21/09   BM stent RCA  . Osteoarthritis   . Osteoporosis, senile   . Postsurgical percutaneous transluminal coronary angioplasty status   . Pure hypercholesterolemia   . Sinoatrial node dysfunction (HCC)   . Spinal stenosis   . Spinal stenosis of lumbar region   . Tachy-brady  syndrome (HCC)    s/p PPM, battery change 08/01/09  . Vitamin D deficiency 10/2011    Past Surgical History:  Procedure Laterality Date  . CARPAL TUNNEL RELEASE Right 02/07/09  . CORONARY ANGIOPLASTY WITH STENT PLACEMENT  09/21/09   By Dr. Amil AmenEdmunds. ACS, NSTEMI - BM stent RCA  . OOPHORECTOMY     With hysterectomy. One ovary removed-fibroids.  Marland Kitchen. PACEMAKER GENERATOR CHANGE  08/04/09   By Dr. Amil AmenEdmunds. New pacing generator is a Medtronic EnRhythm, model [id number], serial [id number]  . PACEMAKER INSERTION  2004   Gen change 08/04/09 by Dr. Amil AmenEdmunds. New pacing generator is a Medtronic EnRhythm, model [id number], serial [id number]  . PERCUTANEOUS CORONARY STENT INTERVENTION (PCI-S)  05/08/02   (3 tandem Cypher stent), proximal and mid LAD  . VESICOVAGINAL FISTULA CLOSURE W/ TAH      Allergies  Allergen Reactions  . Fosamax [Alendronate Sodium]   . Other Other (See Comments)    Allergic to crab meat, but no shellfish allergy (can eat lobster, shrimp, etc.)  Crab meat causes angioedema of the throat.  Marland Kitchen. Penicillins   . Sulfa Antibiotics     Allergies as of 04/07/2016      Reactions   Fosamax [alendronate Sodium]    Other Other (See Comments)   Allergic to crab meat, but no shellfish allergy (can eat lobster, shrimp, etc.)  Crab meat causes angioedema of the throat.   Penicillins    Sulfa Antibiotics  Medication List       Accurate as of 04/07/16 12:03 PM. Always use your most recent med list.          acetaminophen 325 MG tablet Commonly known as:  TYLENOL Take 650 mg by mouth. Take 2 tablets every 6 hours as needed for pain   aspirin EC 81 MG tablet Take 81 mg by mouth daily.   CARTIA XT 120 MG 24 hr capsule Generic drug:  diltiazem Take 120 mg by mouth.   CRESTOR 5 MG tablet Generic drug:  rosuvastatin Take 5 mg by mouth at bedtime.   divalproex 125 MG capsule Commonly known as:  DEPAKOTE SPRINKLE 2 (two) times daily.   donepezil 5 MG tablet Commonly known as:   ARICEPT Take 5 mg by mouth at bedtime.   ezetimibe 10 MG tablet Commonly known as:  ZETIA Take 10 mg by mouth at bedtime.   levothyroxine 25 MCG tablet Commonly known as:  SYNTHROID, LEVOTHROID Take 25 mcg by mouth daily before breakfast.   MULTAQ 400 MG tablet Generic drug:  dronedarone TAKE 1 TABLET BY MOUTH TWICE DAILY   nystatin cream Commonly known as:  MYCOSTATIN Apply 1 application topically daily as needed (rash).   polyethylene glycol packet Commonly known as:  MIRALAX / GLYCOLAX Take 17 g by mouth daily.   ranitidine 150 MG capsule Commonly known as:  ZANTAC Take 150 mg by mouth daily as needed for heartburn.   sodium chloride 0.65 % Soln nasal spray Commonly known as:  OCEAN Place 2 sprays into both nostrils 2 (two) times daily as needed for congestion.       Review of Systems:  Review of Systems  Constitutional: Negative for chills, fever and malaise/fatigue.  HENT: Positive for hearing loss.   Eyes: Negative for blurred vision.  Respiratory: Negative for cough and shortness of breath.   Cardiovascular: Negative for chest pain, palpitations and leg swelling.  Gastrointestinal: Negative for abdominal pain, blood in stool, constipation, diarrhea and melena.  Genitourinary: Negative for dysuria.  Musculoskeletal: Negative for falls.  Skin: Negative for itching and rash.  Neurological: Positive for sensory change. Negative for dizziness and weakness.  Endo/Heme/Allergies:       Diabetes, control improved  Psychiatric/Behavioral: Positive for memory loss. The patient does not have insomnia.     Health Maintenance  Topic Date Due  . FOOT EXAM  11/14/1928  . OPHTHALMOLOGY EXAM  11/14/1930  . URINE MICROALBUMIN  11/14/1930  . DEXA SCAN  11/13/1985  . PNA vac Low Risk Adult (2 of 2 - PCV13) 03/24/2006  . TETANUS/TDAP  03/19/2014  . HEMOGLOBIN A1C  10/06/2016  . INFLUENZA VACCINE  Completed    Physical Exam: Vitals:   04/07/16 1148  BP: (!) 158/80    Pulse: 72  Temp: 98 F (36.7 C)  TempSrc: Oral  SpO2: 97%  Weight: 135 lb (61.2 kg)   Body mass index is 30.27 kg/m. Physical Exam  Constitutional: She appears well-developed and well-nourished. No distress.  HENT:  Right ear with cerumen impaction; left with some mild cerumen   Cardiovascular: Normal rate, regular rhythm and intact distal pulses.   Murmur heard. Pulmonary/Chest: Effort normal and breath sounds normal. No respiratory distress.  Musculoskeletal: Normal range of motion.  Walks with rollator walker  Neurological: She is alert.  Oriented to person only, very poor short term memory (60s or less with recent information)  Skin: Skin is warm and dry.  Psychiatric: She has a normal mood and affect.  Labs reviewed: Basic Metabolic Panel:  Recent Labs  16/10/96 0304 01/05/16 0700 04/05/16 0300  NA 141 140  --   K 4.5 5.1  --   BUN 26* 26*  --   CREATININE 1.2* 1.5*  --   TSH  --   --  5.95*   Liver Function Tests:  Recent Labs  06/10/15 0304  AST 15  ALT 15  ALKPHOS 67   No results for input(s): LIPASE, AMYLASE in the last 8760 hours. No results for input(s): AMMONIA in the last 8760 hours. CBC:  Recent Labs  06/10/15 0304 01/05/16 0700  WBC 10.8 11.3  HGB 13.0 15.0  HCT 43 48*  PLT 242 252   Lipid Panel:  Recent Labs  06/10/15 0304 04/05/16 0300  CHOL 148 135  HDL 54 48  LDLCALC 64 60  TRIG 153 137   Lab Results  Component Value Date   HGBA1C 7.6 04/05/2016    Assessment/Plan 1. Type 2 diabetes mellitus with complication, without long-term current use of insulin (HCC) -not on medication, but doing better with smaller portions and fewer sweets so hba1c down to 7.6 again (goal <8.5 in her situation) -no lesions of feet on exam  2. Acquired hypothyroidism -will need to recheck before next appt due to upward trend of tsh Lab Results  Component Value Date   TSH 5.95 (A) 04/05/2016   3. CAD in native artery -asymptomatic,  cont current regimen and monitor  4. Vascular dementia with behavior disturbance -she gets very agitated when she cannot hear and yells at staff including myself when she cannot -cont aricept only b/c she's been pretty stable from a behavioral standpoint so I've opted not to rock the boat by stopping it  5. Hearing loss due to cerumen impaction, right -hearing was worse today than usual and she was more agitated -left I could see some cerumen, but TM visible to did not address, but right was full of dry scaly wax and preventing her from even hearing her daughter in law  6. Sensorineural hearing loss (SNHL) of both ears -bilateral, will at least improve a little I hope with irrigation of right today  7. Need for 23-polyvalent pneumococcal polysaccharide vaccine -pneumovax given   Labs/tests ordered:  Bmp, tsh, hba1c Next appt:  4 mos with labs before  Autumne Kallio L. Travonte Byard, D.O. Geriatrics [facility] [address], [place] [address] Cell Phone (Mon-Fri 8am-5pm):  [phone] On Call:  [phone] & follow prompts after 5pm & weekends Office Phone:  [phone] Office Fax:  [phone]

## 2016-04-28 ENCOUNTER — Encounter: Payer: Self-pay | Admitting: Neurology

## 2016-04-28 ENCOUNTER — Ambulatory Visit (INDEPENDENT_AMBULATORY_CARE_PROVIDER_SITE_OTHER): Payer: Medicare Other | Admitting: Neurology

## 2016-04-28 VITALS — BP 138/63 | HR 74 | Wt 134.8 lb

## 2016-04-28 DIAGNOSIS — F0151 Vascular dementia with behavioral disturbance: Secondary | ICD-10-CM | POA: Diagnosis not present

## 2016-04-28 DIAGNOSIS — I2510 Atherosclerotic heart disease of native coronary artery without angina pectoris: Secondary | ICD-10-CM | POA: Diagnosis not present

## 2016-04-28 DIAGNOSIS — F01518 Vascular dementia, unspecified severity, with other behavioral disturbance: Secondary | ICD-10-CM
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
## 2016-04-28 NOTE — Patient Instructions (Addendum)
The patient was not accompanied by a family member or aide who knows her today. It appears she is doing reasonably well without any recent changes and medical issues. Continue aspirin for stroke prevention with strict control of hypertension with blood pressure goal below 130/90] with LDL cholesterol goal below 70 mg percent. Continue Aricept and the current dose for her dementia. Follow-up in the future in a year or call earlier if necessary

## 2016-06-30 ENCOUNTER — Non-Acute Institutional Stay: Payer: Medicare Other

## 2016-06-30 DIAGNOSIS — Z0000 Encounter for general adult medical examination without abnormal findings: Secondary | ICD-10-CM

## 2016-06-30 NOTE — Patient Instructions (Addendum)
[name] , Thank you for taking time to come for your Medicare Wellness Visit. I appreciate your ongoing commitment to your health goals. Please review the following plan we discussed and let me know if I can assist you in the future.   Screening recommendations/referrals: Colonoscopy up to date, pt over age 375 Mammogram up to date, pt over age 81 Bone Density up to date Recommended yearly ophthalmology/optometry visit for glaucoma screening and checkup Recommended yearly dental visit for hygiene and checkup  Vaccinations: Influenza vaccine up to date. Due 11/05/2016 Pneumococcal vaccine up to date Tdap vaccine due Shingles vaccine up to date   Advanced directives: In Chart  Conditions/risks identified: Audiology referral  Next appointment: 08/04/16 @ 11:30am Dr Renato Gailseed   Preventive Care 65 Years and Older, Female Preventive care refers to lifestyle choices and visits with your health care provider that can promote health and wellness. What does preventive care include?  A yearly physical exam. This is also called an annual well check.  Dental exams once or twice a year.  Routine eye exams. Ask your health care provider how often you should have your eyes checked.  Personal lifestyle choices, including:  Daily care of your teeth and gums.  Regular physical activity.  Eating a healthy diet.  Avoiding tobacco and drug use.  Limiting alcohol use.  Practicing safe sex.  Taking low-dose aspirin every day.  Taking vitamin and mineral supplements as recommended by your health care provider. What happens during an annual well check? The services and screenings done by your health care provider during your annual well check will depend on your age, overall health, lifestyle risk factors, and family history of disease. Counseling  Your health care provider may ask you questions about your:  Alcohol use.  Tobacco use.  Drug use.  Emotional well-being.  Home and  relationship well-being.  Sexual activity.  Eating habits.  History of falls.  Memory and ability to understand (cognition).  Work and work Astronomerenvironment.  Reproductive health. Screening  You may have the following tests or measurements:  Height, weight, and BMI.  Blood pressure.  Lipid and cholesterol levels. These may be checked every 5 years, or more frequently if you are over 81 years old.  Skin check.  Lung cancer screening. You may have this screening every year starting at age 81 if you have a 30-pack-year history of smoking and currently smoke or have quit within the past 15 years.  Fecal occult blood test (FOBT) of the stool. You may have this test every year starting at age 81.  Flexible sigmoidoscopy or colonoscopy. You may have a sigmoidoscopy every 5 years or a colonoscopy every 10 years starting at age 81.  Hepatitis C blood test.  Hepatitis B blood test.  Sexually transmitted disease (STD) testing.  Diabetes screening. This is done by checking your blood sugar (glucose) after you have not eaten for a while (fasting). You may have this done every 1-3 years.  Bone density scan. This is done to screen for osteoporosis. You may have this done starting at age 81.  Mammogram. This may be done every 1-2 years. Talk to your health care provider about how often you should have regular mammograms. Talk with your health care provider about your test results, treatment options, and if necessary, the need for more tests. Vaccines  Your health care provider may recommend certain vaccines, such as:  Influenza vaccine. This is recommended every year.  Tetanus, diphtheria, and acellular pertussis (Tdap, Td)  vaccine. You may need a Td booster every 10 years.  Zoster vaccine. You may need this after age 50.  Pneumococcal 13-valent conjugate (PCV13) vaccine. One dose is recommended after age 1.  Pneumococcal polysaccharide (PPSV23) vaccine. One dose is recommended after  age 29. Talk to your health care provider about which screenings and vaccines you need and how often you need them. This information is not intended to replace advice given to you by your health care provider. Make sure you discuss any questions you have with your health care provider. Document Released: 01/31/2015 Document Revised: 09/24/2015 Document Reviewed: 11/05/2014 Elsevier Interactive Patient Education  2017 [place] Prevention in the Home Falls can cause injuries. They can happen to people of all ages. There are many things you can do to make your home safe and to help prevent falls. What can I do on the outside of my home?  Regularly fix the edges of walkways and driveways and fix any cracks.  Remove anything that might make you trip as you walk through a door, such as a raised step or threshold.  Trim any bushes or trees on the path to your home.  Use bright outdoor lighting.  Clear any walking paths of anything that might make someone trip, such as rocks or tools.  Regularly check to see if handrails are loose or broken. Make sure that both sides of any steps have handrails.  Any raised decks and porches should have guardrails on the edges.  Have any leaves, snow, or ice cleared regularly.  Use sand or salt on walking paths during winter.  Clean up any spills in your garage right away. This includes oil or grease spills. What can I do in the bathroom?  Use night lights.  Install grab bars by the toilet and in the tub and shower. Do not use towel bars as grab bars.  Use non-skid mats or decals in the tub or shower.  If you need to sit down in the shower, use a plastic, non-slip stool.  Keep the floor dry. Clean up any water that spills on the floor as soon as it happens.  Remove soap buildup in the tub or shower regularly.  Attach bath mats securely with double-sided non-slip rug tape.  Do not have throw rugs and other things on the floor that can  make you trip. What can I do in the bedroom?  Use night lights.  Make sure that you have a light by your bed that is easy to reach.  Do not use any sheets or blankets that are too big for your bed. They should not hang down onto the floor.  Have a firm chair that has side arms. You can use this for support while you get dressed.  Do not have throw rugs and other things on the floor that can make you trip. What can I do in the kitchen?  Clean up any spills right away.  Avoid walking on wet floors.  Keep items that you use a lot in easy-to-reach places.  If you need to reach something above you, use a strong step stool that has a grab bar.  Keep electrical cords out of the way.  Do not use floor polish or wax that makes floors slippery. If you must use wax, use non-skid floor wax.  Do not have throw rugs and other things on the floor that can make you trip. What can I do with my stairs?  Do not leave  any items on the stairs.  Make sure that there are handrails on both sides of the stairs and use them. Fix handrails that are broken or loose. Make sure that handrails are as long as the stairways.  Check any carpeting to make sure that it is firmly attached to the stairs. Fix any carpet that is loose or worn.  Avoid having throw rugs at the top or bottom of the stairs. If you do have throw rugs, attach them to the floor with carpet tape.  Make sure that you have a light switch at the top of the stairs and the bottom of the stairs. If you do not have them, ask someone to add them for you. What else can I do to help prevent falls?  Wear shoes that:  Do not have high heels.  Have rubber bottoms.  Are comfortable and fit you well.  Are closed at the toe. Do not wear sandals.  If you use a stepladder:  Make sure that it is fully opened. Do not climb a closed stepladder.  Make sure that both sides of the stepladder are locked into place.  Ask someone to hold it for you,  if possible.  Clearly mark and make sure that you can see:  Any grab bars or handrails.  First and last steps.  Where the edge of each step is.  Use tools that help you move around (mobility aids) if they are needed. These include:  Canes.  Walkers.  Scooters.  Crutches.  Turn on the lights when you go into a dark area. Replace any light bulbs as soon as they burn out.  Set up your furniture so you have a clear path. Avoid moving your furniture around.  If any of your floors are uneven, fix them.  If there are any pets around you, be aware of where they are.  Review your medicines with your doctor. Some medicines can make you feel dizzy. This can increase your chance of falling. Ask your doctor what other things that you can do to help prevent falls. This information is not intended to replace advice given to you by your health care provider. Make sure you discuss any questions you have with your health care provider. Document Released: 10/31/2008 Document Revised: 06/12/2015 Document Reviewed: 02/08/2014 Elsevier Interactive Patient Education  2017 [employer].

## 2016-06-30 NOTE — Progress Notes (Signed)
Subjective:   [name] is a 83 y.o. female who presents for Medicare Annual (Subsequent) preventive examination at [facility]      Objective:     Vitals: BP 130/72 (BP Location: Left Arm, Patient Position: Sitting)   Pulse 62   Temp 97.8 F (36.6 C) (Oral)   Ht 4\' 8"  (1.422 m)   Wt 135 lb (61.2 kg)   SpO2 97%   BMI 30.27 kg/m   Body mass index is 30.27 kg/m.   Tobacco History  Smoking Status  . Never Smoker  Smokeless Tobacco  . Never Used     Counseling given: Not Answered   Past Medical History:  Diagnosis Date  . Aortic sclerosis   . Atrial fibrillation (HCC)   . Benign hypertensive heart disease without heart failure   . Benign hypertensive kidney disease with chronic kidney disease stage I through stage IV, or unspecified(403.10)   . Carpal tunnel syndrome   . Chronic anemia   . Chronic anemia   . Chronic renal disease, stage III   . Coronary atherosclerosis of native coronary artery   . Diabetes mellitus with chronic kidney disease (HCC)   . Essential hypertension, benign   . H/O echocardiogram 07/19/09   EF = 60-65%  . Hyperglycemia    Mild  . Hyperlipidemia    Tolerating medication well as of 11/27/12  . Hypothyroidism   . Microalbuminuria 02/2010  . NSTEMI (non-ST elevated myocardial infarction) (HCC) 09/21/09   BM stent RCA  . Osteoarthritis   . Osteoporosis, senile   . Postsurgical percutaneous transluminal coronary angioplasty status   . Pure hypercholesterolemia   . Sinoatrial node dysfunction (HCC)   . Spinal stenosis   . Spinal stenosis of lumbar region   . Tachy-brady syndrome (HCC)    s/p PPM, battery change 08/01/09  . Vitamin D deficiency 10/2011   Past Surgical History:  Procedure Laterality Date  . CARPAL TUNNEL RELEASE Right 02/07/09  . CORONARY ANGIOPLASTY WITH STENT PLACEMENT  09/21/09   By Dr. Amil Amen. ACS, NSTEMI - BM stent RCA  . OOPHORECTOMY     With hysterectomy. One ovary removed-fibroids.    Marland Kitchen PACEMAKER GENERATOR CHANGE  08/04/09   By Dr. Amil Amen. New pacing generator is a Medtronic EnRhythm, model D1939726, serial [id number]  . PACEMAKER INSERTION  2004   Gen change 08/04/09 by Dr. Amil Amen. New pacing generator is a Medtronic EnRhythm, model D1939726, serial [id number]  . PERCUTANEOUS CORONARY STENT INTERVENTION (PCI-S)  05/08/02   (3 tandem Cypher stent), proximal and mid LAD  . VESICOVAGINAL FISTULA CLOSURE W/ TAH     Family History  Problem Relation Age of Onset  . Heart disease Mother   . Heart disease Father   . Diabetes Father   . Peripheral vascular disease Brother   . Cerebral palsy Son    History  Sexual Activity  . Sexual activity: No    Outpatient Encounter Prescriptions as of 06/30/2016  Medication Sig  . acetaminophen (TYLENOL) 325 MG tablet Take 650 mg by mouth. Take 2 tablets every 6 hours as needed for pain  . aspirin EC 81 MG tablet Take 81 mg by mouth daily.  Marland Kitchen CARTIA XT 120 MG 24 hr capsule Take 120 mg by mouth.   . divalproex (DEPAKOTE SPRINKLE) 125 MG capsule 2 (two) times daily.   Marland Kitchen donepezil (ARICEPT) 5 MG tablet Take 5 mg by mouth at bedtime.  Marland Kitchen ezetimibe (ZETIA) 10 MG tablet Take 10 mg by mouth  at bedtime.  Marland Kitchen levothyroxine (SYNTHROID, LEVOTHROID) 25 MCG tablet Take 25 mcg by mouth daily before breakfast.  . MULTAQ 400 MG tablet TAKE 1 TABLET BY MOUTH TWICE DAILY  . nystatin cream (MYCOSTATIN) Apply 1 application topically daily as needed (rash).  . polyethylene glycol (MIRALAX / GLYCOLAX) packet Take 17 g by mouth daily.  . ranitidine (ZANTAC) 150 MG capsule Take 150 mg by mouth daily as needed for heartburn.  . rosuvastatin (CRESTOR) 5 MG tablet Take 5 mg by mouth at bedtime.   . sodium chloride (OCEAN) 0.65 % SOLN nasal spray Place 2 sprays into both nostrils 2 (two) times daily as needed for congestion.   No facility-administered encounter medications on file as of 06/30/2016.     Activities of Daily Living In your present state of  health, do you have any difficulty performing the following activities: 06/30/2016  Hearing? Y  Vision? N  Difficulty concentrating or making decisions? Y  Walking or climbing stairs? Y  Dressing or bathing? Y  Doing errands, shopping? N  Preparing Food and eating ? Y  Using the Toilet? Y  In the past six months, have you accidently leaked urine? N  Do you have problems with loss of bowel control? N  Managing your Medications? Y  Managing your Finances? Y  Housekeeping or managing your Housekeeping? Y  Some recent data might be hidden    Patient Care Team: Kermit Balo, DO as PCP - General (Geriatric Medicine) Early, Kristen Loader, MD as Consulting Physician (Vascular Surgery) Ranelle Oyster, MD as Consulting Physician (Physical Medicine and Rehabilitation) Micki Riley, MD as Consulting Physician (Neurology) Hillis Range, MD as Consulting Physician (Cardiology) Mckinley Jewel, MD as Consulting Physician (Ophthalmology) Salvatore Marvel, MD as Consulting Physician (Orthopedic Surgery) Marcine Matar, MD as Consulting Physician (Urology)    Assessment:     Exercise Activities and Dietary recommendations Current Exercise Habits: The patient does not participate in regular exercise at present, Exercise limited by: None identified  Goals    . Walking          Pt would like to walk in the courtyard every day      Fall Risk Fall Risk  06/30/2016 01/07/2016 04/30/2015 11/13/2014 10/30/2014  Falls in the past year? No Yes Yes No No  Number falls in past yr: - 1 1 - -  Injury with Fall? - No No - -  Risk for fall due to : - - - - -   Depression Screen PHQ 2/9 Scores 06/30/2016 01/07/2016 11/13/2014 10/30/2014  PHQ - 2 Score 0 0 0 0     Cognitive Function MMSE - Mini Mental State Exam 04/28/2016 10/30/2015  Orientation to time 4 3  Orientation to Place 0 4  Registration 0 3  Attention/ Calculation 0 1  Recall 0 0  Language- name 2 objects 2 2  Language- repeat 1 1   Language- follow 3 step command 3 3  Language- read & follow direction 0 1  Write a sentence 1 1  Copy design 1 1  Total score 12 20        Immunization History  Administered Date(s) Administered  . Influenza Inj Mdck Quad Pf 11/06/2015  . Influenza-Unspecified 11/09/2012, 10/31/2014  . Pneumococcal Polysaccharide-23 03/23/2005, 04/07/2016  . Td 03/18/2004  . Zoster 08/07/2007   Screening Tests Health Maintenance  Topic Date Due  . TETANUS/TDAP  03/19/2014  . INFLUENZA VACCINE  08/18/2016  . HEMOGLOBIN A1C  10/06/2016  . FOOT  EXAM  04/07/2017  . PNA vac Low Risk Adult (2 of 2 - PCV13) 04/07/2017      Plan:    I have personally reviewed and addressed the Medicare Annual Wellness questionnaire and have noted the following in the patient's chart:  A. Medical and social history B. Use of alcohol, tobacco or illicit drugs  C. Current medications and supplements D. Functional ability and status E.  Nutritional status F.  Physical activity G. Advance directives H. List of other physicians I.  Hospitalizations, surgeries, and ER visits in previous 12 months J.  Vitals K. Screenings to include hearing, vision, cognitive, depression L. Referrals and appointments - none  In addition, I have reviewed and discussed with patient certain preventive protocols, quality metrics, and best practice recommendations. A written personalized care plan for preventive services as well as general preventive health recommendations were provided to patient.  See attached scanned questionnaire for additional information.   Signed,   Annetta MawSara Gonthier, RN Nurse Health Advisor   Quick Notes   Health Maintenance: TDAP due     Abnormal Screen: MMSE 20/30. Passed clock drawing     Patient Concerns: None     Nurse Concerns: Putting in referral for audiology for hearing aids    I reviewed health advisor's note, was available for consultation and agree with the assessment and plan as  written.  I believe family had previously declined further audiology testing and hearing aids due to advancing dementia and losing previous ones?    Tiffany L. Reed, D.O. Geriatrics [facility] [address] [place] [place], [place] [address] Cell Phone (Mon-Fri 8am-5pm):  [phone] On Call:  [phone] & follow prompts after 5pm & weekends Office Phone:  [phone] Office Fax:  [phone]

## 2016-08-02 DIAGNOSIS — E039 Hypothyroidism, unspecified: Secondary | ICD-10-CM | POA: Diagnosis not present

## 2016-08-02 DIAGNOSIS — D649 Anemia, unspecified: Secondary | ICD-10-CM | POA: Diagnosis not present

## 2016-08-02 DIAGNOSIS — E119 Type 2 diabetes mellitus without complications: Secondary | ICD-10-CM | POA: Diagnosis not present

## 2016-08-02 DIAGNOSIS — E118 Type 2 diabetes mellitus with unspecified complications: Secondary | ICD-10-CM | POA: Diagnosis not present

## 2016-08-02 LAB — TSH: TSH: 4.7 (ref 0.41–5.90)

## 2016-08-02 LAB — BASIC METABOLIC PANEL
BUN: 24 — AB (ref 4–21)
Creatinine: 1.3 — AB (ref 0.5–1.1)
Glucose: 263
Potassium: 5.3 (ref 3.4–5.3)
Sodium: 143 (ref 137–147)

## 2016-08-02 LAB — HEMOGLOBIN A1C: Hemoglobin A1C: 7.9

## 2016-08-04 ENCOUNTER — Encounter: Payer: Self-pay | Admitting: Internal Medicine

## 2016-08-05 IMAGING — CT CT ANGIO HEAD
1 of 11 series · 5 of 47 positions shown · IV contrast (OMNI)
Comparison: Head CTs without contrast 05/09/2014 and earlier. CTA
head and neck 04/09/2006.

ADDENDUM:
Study discussed by telephone with NP ENJOYAV EUI on 05/10/2014 at
2877 hours.
CLINICAL DATA: [AGE] female code stroke treated with IV tPA.
Intracranial hemorrhage. Initial encounter.

EXAM:
CT ANGIOGRAPHY HEAD AND NECK
TECHNIQUE: Multidetector CT imaging of the head and neck was performed using
the standard protocol during bolus administration of intravenous
contrast. Multiplanar CT image reconstructions and MIPs were
obtained to evaluate the vascular anatomy. Carotid stenosis
measurements (when applicable) are obtained utilizing NASCET
criteria, using the distal internal carotid diameter as the
denominator.
CONTRAST:  50mL OMNIPAQUE IOHEXOL 350 MG/ML SOLN

[Series 7: carotid/brain 2.0 i30f 3 · axial · 0.44mm/px · z∈[-229,-15]mm · 5 of 161 slices shown]
[im 27/161  brain]
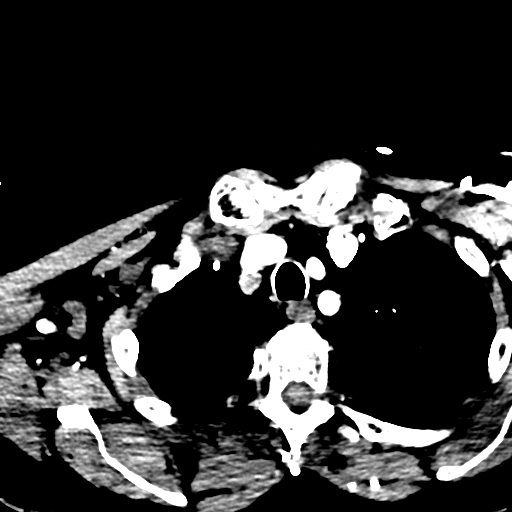
[im 54/161  bone]
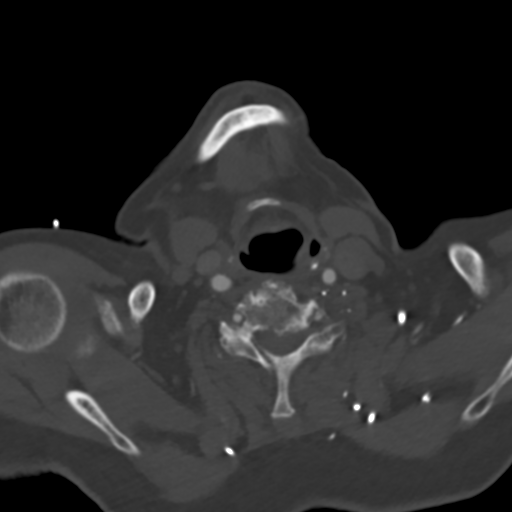
[im 81/161  brain]
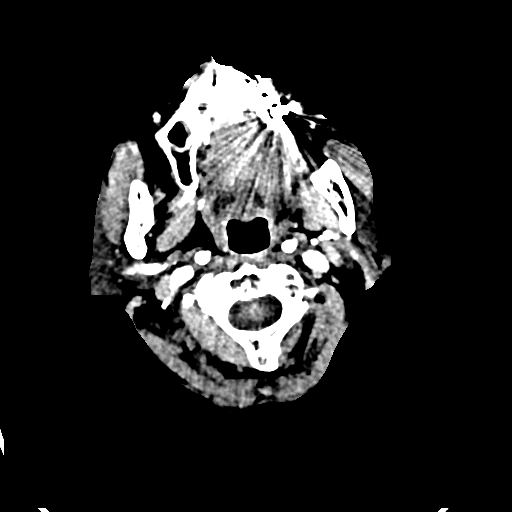
[im 107/161  bone]
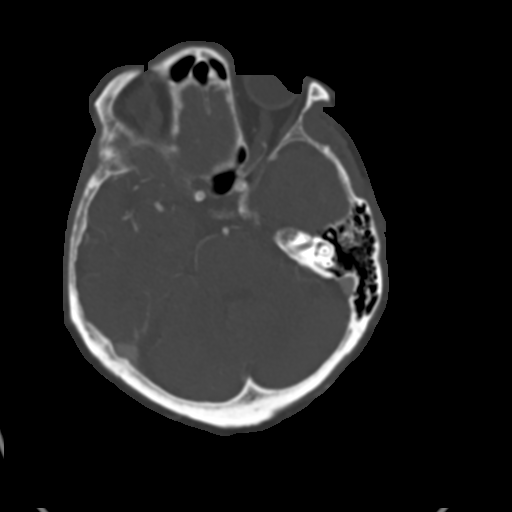
[im 134/161  brain]
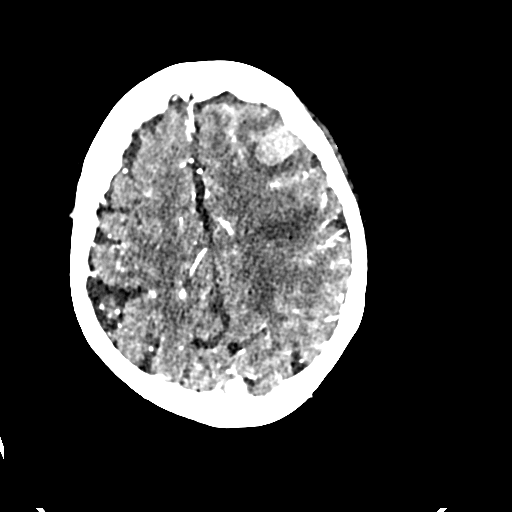

[5 of 47 positions shown; findings below may reference images not displayed]

FINDINGS: CT HEAD

Brain: Anterior superior left frontal lobe intra-axial round
hemorrhage has not significantly changed measuring 22-24 mm
diameter. Estimated hemorrhage volume 7 mL. Surrounding trace
subarachnoid hemorrhage. Small volume subarachnoid hemorrhage in the
central sulcus is stable. Trace subarachnoid hemorrhage in the right
parietal lobe is stable. No intraventricular hemorrhage or
ventriculomegaly.

Left Motor strip and peri-Rolandic cortex and subcortical white
matter hypodensity has developed since 05/08/2014 compatible with
cytotoxic edema. No significant mass effect.

Stable gray-white matter differentiation elsewhere with chronic
confluent white matter hypodensity. No midline shift or significant
intracranial hemorrhage.

Calvarium and skull base: Intact.

Paranasal sinuses: Stable minor paranasal sinus mucosal thickening.

Orbits: Stable and negative orbit and scalp soft tissues.

CTA NECK

Skeleton: Advanced degenerative changes in the cervical spine.
Osteopenia. No acute osseous abnormality identified.

Other neck: Partially visible left chest cardiac pacemaker and
leads. Mild dependent patchy opacity in the right upper lobe on
series 7, image 12. No superior mediastinal lymphadenopathy.

Stable thyroid. Larynx, pharynx, parapharyngeal spaces,
retropharyngeal space, sublingual space, submandibular glands and
parotid glands are within normal limits. No cervical
lymphadenopathy.

Aortic arch: 3 vessel arch configuration. Moderate arch
atherosclerosis has not significantly changed since 8660. No great
vessel origin stenosis.

Right carotid system: Progressed soft and calcified plaque at the
right carotid bifurcation, now all with high-grade stenosis,
radiographic string sign at the right ICA origin and proximal bulb
(series 11, image 95).

Despite this, the right ICA is patent and otherwise negative to the
skullbase.

Left carotid system: Normal left CCA origin. Mild plaque proximal to
the left carotid bifurcation. Chronic bulky calcified plaque at the
left ICA origin and bulb. Radiographic string sign (series 7, image
68) through the proximal 8 mm of the left ICA, superimposed on
tortuosity of the vessel just beyond the bulb with a high-grade
stenosis and kinked appearance (series 9, image 117).

Retropharyngeal course then of the left ICA which more distally is
normal to the skullbase.

Vertebral arteries:No proximal subclavian artery stenosis.

Calcified plaque at the right vertebral artery origin but with up to
only mild stenosis (coronal image 128 series 9). Distal V1 calcified
plaque with up to 50% stenosis. Minimal V2 segment atherosclerosis,
and no additional stenosis of the right vertebral artery to the
skullbase.

Partially obscured left vertebral artery origin due to paravertebral
venous contrast reflux. Contiguous calcified plaque in the proximal
left subclavian artery suspected to involve the left vertebral
artery origin and up to high-grade stenosis is possible. Calcified
plaque in the V1 segment is superimposed with less than 50 percent
stenosis. Mild calcified plaque in the V2 segment with no stenosis.
No additional hemodynamically significant stenosis to the skullbase.

CTA HEAD

Posterior circulation: Both distal vertebral arteries are patent
with calcified plaque on the right and soft and calcified plaque on
the left. There is hemodynamically significant stenosis of the
distal left vertebral artery which appears diminutive. The left PICA
origin remains patent. No significant distal right vertebral artery
stenosis. Normal right PICA origin.

Patent vertebrobasilar junction. No basilar artery stenosis. SCA and
PCA origins are normal. Normal posterior communicating arteries.
Bilateral PCA branches are within normal limits.

Anterior circulation: Bilateral calcified ICA siphon plaque appears
to result in less than 50% stenosis bilaterally. Ophthalmic and
posterior communicating artery origins are normal. Carotid termini
are patent. MCA and ACA origins are patent. Anterior communicating
artery and bilateral ACA branches are within normal limits. Right
MCA M1 segment and branches are within normal limits.

Left MCA M1 segment is patent. Left MCA bifurcation is patent
without stenosis. Left MCA M2 branches appear stable compared to
8660. No major left MCA branch occlusion is identified. However,
there is decreased flow in at posterior branch of the posterior MCA
division seen on series 12, image 33.

Venous sinuses: Patent.

Anatomic variants: None.

Delayed phase: No abnormal enhancement identified.
IMPRESSION: 1. Cytotoxic edema along the left peri-Rolandic cortex has developed
compatible with MCA territory infarct. No mass effect.
2. Stable intra-axial and trace subarachnoid hemorrhage as seen on
05/09/2014. Anterior superior left frontal lobe intra-axial blood
volume of 7 mL.
3. Progressed bilateral ICA origin stenosis since 8660 now with
bilateral ICA RADIOGRAPHIC STRING SIGNS.
4. Patent left MCA M1 segment and bifurcation with no major branch
occlusion, but irregular appearing posterior most sylvian division
left MCA branch with attenuated enhancement.
5. Bilateral vertebral artery atherosclerosis with up to 50%
vertebral artery stenosis on the right. The left vertebral artery
origin is not well visualized, and there is distal left vertebral
(V4 segment) stenosis with diminutive and irregular appearance of
the vessel to the vertebrobasilar junction. The left PICA remains
patent.
6. Mild dependent opacity in the right lung could reflect
atelectasis, but developing right lung infection would be difficult
to exclude.

## 2016-08-07 IMAGING — CR DG CHEST 1V PORT
1 series · 1 of 1 positions shown · non-contrast
Comparison: 05/10/2014.

CLINICAL DATA: Lung opacity on prior CT

EXAM:
PORTABLE CHEST - 1 VIEW

[AP]
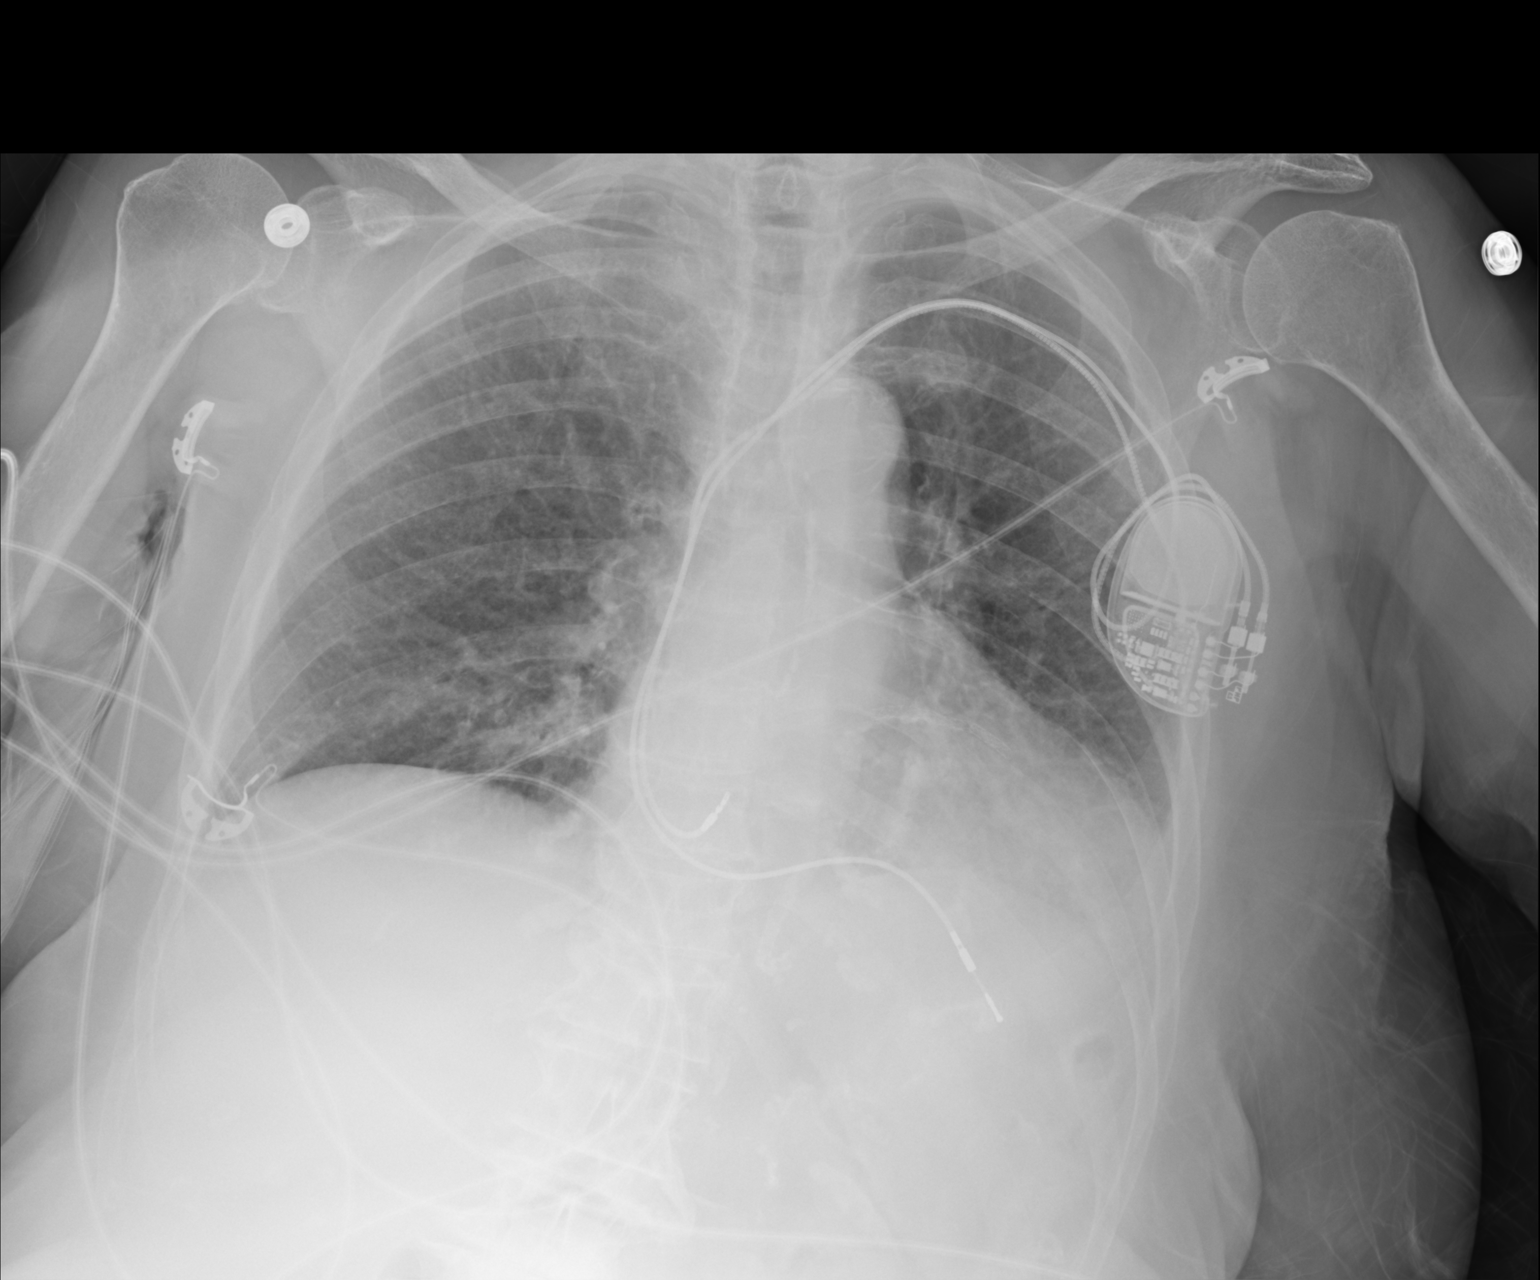

[1 of 1 positions shown; findings below may reference images not displayed]

FINDINGS: Dual lead LEFT subclavian cardiac pacemaker. Cardiopericardial
silhouette within normal limits. Bilateral basilar opacity, greater
on the LEFT when compared to the RIGHT. Differential considerations
are atelectasis, edema, aspiration. Probable small LEFT pleural
effusion. No upper lobe consolidation is identified.
IMPRESSION: Bibasilar opacity with differential considerations discussed above.
No upper lobe opacification is identified.

## 2016-08-25 ENCOUNTER — Non-Acute Institutional Stay: Payer: Medicare Other | Admitting: Internal Medicine

## 2016-08-25 ENCOUNTER — Encounter: Payer: Self-pay | Admitting: Internal Medicine

## 2016-08-25 VITALS — BP 150/70 | HR 111 | Temp 98.5°F | Wt 133.0 lb

## 2016-08-25 DIAGNOSIS — I2510 Atherosclerotic heart disease of native coronary artery without angina pectoris: Secondary | ICD-10-CM

## 2016-08-25 DIAGNOSIS — F0151 Vascular dementia with behavioral disturbance: Secondary | ICD-10-CM

## 2016-08-25 DIAGNOSIS — H903 Sensorineural hearing loss, bilateral: Secondary | ICD-10-CM | POA: Diagnosis not present

## 2016-08-25 DIAGNOSIS — I482 Chronic atrial fibrillation, unspecified: Secondary | ICD-10-CM

## 2016-08-25 DIAGNOSIS — F01518 Vascular dementia, unspecified severity, with other behavioral disturbance: Secondary | ICD-10-CM

## 2016-08-25 DIAGNOSIS — G8191 Hemiplegia, unspecified affecting right dominant side: Secondary | ICD-10-CM

## 2016-08-25 DIAGNOSIS — E118 Type 2 diabetes mellitus with unspecified complications: Secondary | ICD-10-CM

## 2016-08-25 DIAGNOSIS — E039 Hypothyroidism, unspecified: Secondary | ICD-10-CM

## 2016-08-25 NOTE — Progress Notes (Signed)
Location:  Medical illustrator of Service:  Clinic (12)  Provider: Toccara Alford L. Renato Gails, D.O., C.M.D.  Code Status: DNR Goals of Care:  Advanced Directives 08/25/2016  Does Patient Have a Medical Advance Directive? Yes  Type of Advance Directive Out of facility DNR (pink MOST or yellow form);Living will;Healthcare Power of Attorney  Does patient want to make changes to medical advance directive? -  Copy of Healthcare Power of Attorney in Chart? Yes  Would patient like information on creating a medical advance directive? -  Pre-existing out of facility DNR order (yellow form or pink MOST form) Yellow form placed in chart (order not valid for inpatient use)   Chief Complaint  Patient presents with  . Medical Management of Chronic Issues    follow-up    HPI: Patient is a 83 y.o. female seen today for medical management of chronic diseases.    She is doing very well.  She has lost 2 lbs since her last appt.    DMII:  hba1c improved to 7.9.  She is to be eating fewer sweets and smaller portions.    CAD:  She is on baby asa, statin, zetia, bp meds.  No chest pain, no dyspnea on exertion.  Is a bit tachy with exertion, but she is 89 and deconditioned.  Chronic afib:  Rate picks up with activity, but returns to normal at rest.  Continues on cartia xt for rate control, just baby asa for anticoagulation due to her frequent falls.    Right hemiparesis still present, but mild, s/p stroke, is able to ambulate with her rollator walker.    Hypothyroid:  Doing well on her levothyroxine.   Lab Results  Component Value Date   TSH 4.70 08/02/2016   Vascular dementia with behaviors:  Visit went much better today when pt's daughter-in-law was with her and they both came straight back, CMA took vitals and she was seen within 2-3 mins.  She gets very agitated and upset if she has to wait b/c she keeps forgetting what she's waiting for.    She also is very HOH and gets upset  when she cannot hear conversation around her.  I must sit directly across from her so she can read my lips and speak loudly.    Past Medical History:  Diagnosis Date  . Aortic sclerosis   . Atrial fibrillation (HCC)   . Benign hypertensive heart disease without heart failure   . Benign hypertensive kidney disease with chronic kidney disease stage I through stage IV, or unspecified(403.10)   . Carpal tunnel syndrome   . Chronic anemia   . Chronic anemia   . Chronic renal disease, stage III   . Coronary atherosclerosis of native coronary artery   . Diabetes mellitus with chronic kidney disease (HCC)   . Essential hypertension, benign   . H/O echocardiogram 07/19/09   EF = 60-65%  . Hyperglycemia    Mild  . Hyperlipidemia    Tolerating medication well as of 11/27/12  . Hypothyroidism   . Microalbuminuria 02/2010  . NSTEMI (non-ST elevated myocardial infarction) (HCC) 09/21/09   BM stent RCA  . Osteoarthritis   . Osteoporosis, senile   . Postsurgical percutaneous transluminal coronary angioplasty status   . Pure hypercholesterolemia   . Sinoatrial node dysfunction (HCC)   . Spinal stenosis   . Spinal stenosis of lumbar region   . Tachy-brady syndrome (HCC)    s/p PPM, battery change 08/01/09  . Vitamin  D deficiency 10/2011    Past Surgical History:  Procedure Laterality Date  . CARPAL TUNNEL RELEASE Right 02/07/09  . CORONARY ANGIOPLASTY WITH STENT PLACEMENT  09/21/09   By Dr. Amil AmenEdmunds. ACS, NSTEMI - BM stent RCA  . OOPHORECTOMY     With hysterectomy. One ovary removed-fibroids.  Marland Kitchen. PACEMAKER GENERATOR CHANGE  08/04/09   By Dr. Amil AmenEdmunds. New pacing generator is a Medtronic EnRhythm, model [id number], serial [id number]  . PACEMAKER INSERTION  2004   Gen change 08/04/09 by Dr. Amil AmenEdmunds. New pacing generator is a Medtronic EnRhythm, model [id number], serial [id number]  . PERCUTANEOUS CORONARY STENT INTERVENTION (PCI-S)  05/08/02   (3 tandem Cypher stent), proximal and mid LAD  .  VESICOVAGINAL FISTULA CLOSURE W/ TAH      Allergies  Allergen Reactions  . Fosamax [Alendronate Sodium]   . Other Other (See Comments)    Allergic to crab meat, but no shellfish allergy (can eat lobster, shrimp, etc.)  Crab meat causes angioedema of the throat.  Marland Kitchen. Penicillins   . Sulfa Antibiotics     Allergies as of 08/25/2016      Reactions   Fosamax [alendronate Sodium]    Other Other (See Comments)   Allergic to crab meat, but no shellfish allergy (can eat lobster, shrimp, etc.)  Crab meat causes angioedema of the throat.   Penicillins    Sulfa Antibiotics       Medication List       Accurate as of 08/25/16 10:26 AM. Always use your most recent med list.          acetaminophen 325 MG tablet Commonly known as:  TYLENOL Take 650 mg by mouth. Take 2 tablets every 6 hours as needed for pain   aspirin EC 81 MG tablet Take 81 mg by mouth daily.   CARTIA XT 120 MG 24 hr capsule Generic drug:  diltiazem Take 120 mg by mouth.   CRESTOR 5 MG tablet Generic drug:  rosuvastatin Take 5 mg by mouth at bedtime.   divalproex 125 MG capsule Commonly known as:  DEPAKOTE SPRINKLE 2 (two) times daily.   donepezil 5 MG tablet Commonly known as:  ARICEPT Take 5 mg by mouth at bedtime.   ezetimibe 10 MG tablet Commonly known as:  ZETIA Take 10 mg by mouth at bedtime.   levothyroxine 25 MCG tablet Commonly known as:  SYNTHROID, LEVOTHROID Take 25 mcg by mouth daily before breakfast.   MULTAQ 400 MG tablet Generic drug:  dronedarone TAKE 1 TABLET BY MOUTH TWICE DAILY   nystatin cream Commonly known as:  MYCOSTATIN Apply 1 application topically daily as needed (rash).   polyethylene glycol packet Commonly known as:  MIRALAX / GLYCOLAX Take 17 g by mouth daily.   ranitidine 150 MG capsule Commonly known as:  ZANTAC Take 150 mg by mouth daily as needed for heartburn.   sodium chloride 0.65 % Soln nasal spray Commonly known as:  OCEAN Place 2 sprays into both nostrils  2 (two) times daily as needed for congestion.       Review of Systems:  Review of Systems  Constitutional: Negative for chills, fever and malaise/fatigue.  HENT: Positive for hearing loss. Negative for congestion.   Eyes: Negative for blurred vision.  Respiratory: Negative for cough and shortness of breath.   Cardiovascular: Negative for chest pain, palpitations and leg swelling.  Gastrointestinal: Negative for abdominal pain, blood in stool, constipation and melena.  Genitourinary: Negative for dysuria.  Musculoskeletal: Negative for falls.  Skin:  Negative for itching and rash.  Neurological: Negative for dizziness, loss of consciousness and weakness.  Psychiatric/Behavioral: Positive for memory loss. Negative for depression and hallucinations. The patient is not nervous/anxious and does not have insomnia.     Health Maintenance  Topic Date Due  . TETANUS/TDAP  03/19/2014  . INFLUENZA VACCINE  08/18/2016  . HEMOGLOBIN A1C  02/02/2017  . FOOT EXAM  04/07/2017  . PNA vac Low Risk Adult (2 of 2 - PCV13) 04/07/2017    Physical Exam: Vitals:   08/25/16 1021  BP: (!) 150/70  Pulse: (!) 111  Temp: 98.5 F (36.9 C)  TempSrc: Oral  SpO2: 98%  Weight: 133 lb (60.3 kg)   Body mass index is 29.82 kg/m. Physical Exam  Constitutional: She appears well-developed and well-nourished. No distress.  HENT:  Head: Normocephalic and atraumatic.  HOH, only mild cerumen amt present in both ears, able to see TMs  Eyes: Pupils are equal, round, and reactive to light.  Cardiovascular: Intact distal pulses.   Murmur heard. irreg irreg  Pulmonary/Chest: Effort normal and breath sounds normal. No respiratory distress.  Abdominal: Soft. Bowel sounds are normal.  Musculoskeletal: Normal range of motion.  Stooped posture, kyphoscoliosis, ambulates with rollator walker  Neurological: She is alert.  Very poor short term memory loss  Skin: Skin is warm and dry.  Psychiatric: She has a normal  mood and affect.    Labs reviewed: Basic Metabolic Panel:  Recent Labs  16/10/96 0700 04/05/16 0300 08/02/16 0600  NA 140  --  143  K 5.1  --  5.3  BUN 26*  --  24*  CREATININE 1.5*  --  1.3*  TSH  --  5.95* 4.70   Liver Function Tests: No results for input(s): AST, ALT, ALKPHOS, BILITOT, PROT, ALBUMIN in the last 8760 hours. No results for input(s): LIPASE, AMYLASE in the last 8760 hours. No results for input(s): AMMONIA in the last 8760 hours. CBC:  Recent Labs  01/05/16 0700  WBC 11.3  HGB 15.0  HCT 48*  PLT 252   Lipid Panel:  Recent Labs  04/05/16 0300  CHOL 135  HDL 48  LDLCALC 60  TRIG 137   Lab Results  Component Value Date   HGBA1C 7.9 08/02/2016    Assessment/Plan 1. Type 2 diabetes mellitus with complication, without long-term current use of insulin (HCC) -control adequate at <8, w/o hypoglycemia, has lost a couple of lbs -cont current plans -f/u hba1c before next visit  2. Chronic atrial fibrillation (HCC) -rate trends up with activity, but returns to baseline at rest -cont current regimen -pt and family have opted not to use additional anticoagulation due to frequent falls (fortunately none recently)  3. Right hemiparesis (HCC) -ongoing, but mild since stroke, cont secondary stroke prevention  4. Acquired hypothyroidism -stable on current levothyroxine, cont to monitor  5. Vascular dementia with behavior disturbance -stable, no recent behavioral concerns were reported to me--seems these correlate with her inability to hear well many times  6. CAD in native artery -stable, asymptomatic, cont current regimen  7. Sensorineural hearing loss (SNHL) of both ears -ongoing, no cerumen impaction today, but very poor hearing -cont to approach patient so she can best hear  -also makes cognition appear worse   Labs/tests ordered: hba1c, bmp before Next appt:  4 mos med mgt, labs before  [name] L. Loghan Subia, D.O. Geriatrics [facility] [address], [place] [address] Cell Phone (Mon-Fri 8am-5pm):  [phone] On Call:  [phone] & follow prompts after 5pm & weekends Office Phone:  [phone] Office Fax:  [phone]

## 2016-11-11 DIAGNOSIS — Z23 Encounter for immunization: Secondary | ICD-10-CM | POA: Diagnosis not present

## 2017-01-03 DIAGNOSIS — D649 Anemia, unspecified: Secondary | ICD-10-CM | POA: Diagnosis not present

## 2017-01-03 DIAGNOSIS — E0800 Diabetes mellitus due to underlying condition with hyperosmolarity without nonketotic hyperglycemic-hyperosmolar coma (NKHHC): Secondary | ICD-10-CM | POA: Diagnosis not present

## 2017-01-03 DIAGNOSIS — E039 Hypothyroidism, unspecified: Secondary | ICD-10-CM | POA: Diagnosis not present

## 2017-01-03 DIAGNOSIS — E119 Type 2 diabetes mellitus without complications: Secondary | ICD-10-CM | POA: Diagnosis not present

## 2017-01-03 LAB — BASIC METABOLIC PANEL
BUN: 26 — AB (ref 4–21)
Creatinine: 1.4 — AB (ref 0.5–1.1)
Glucose: 254
Potassium: 5.4 — AB (ref 3.4–5.3)
Sodium: 138 (ref 137–147)

## 2017-01-03 LAB — HEMOGLOBIN A1C: Hemoglobin A1C: 8.3

## 2017-01-03 LAB — TSH: TSH: 4.7 (ref 0.41–5.90)

## 2017-01-04 ENCOUNTER — Encounter: Payer: Self-pay | Admitting: Internal Medicine

## 2017-01-05 ENCOUNTER — Non-Acute Institutional Stay: Payer: Medicare Other | Admitting: Internal Medicine

## 2017-01-05 ENCOUNTER — Encounter: Payer: Self-pay | Admitting: Internal Medicine

## 2017-01-05 VITALS — BP 138/70 | HR 63 | Temp 98.6°F | Wt 133.0 lb

## 2017-01-05 DIAGNOSIS — I482 Chronic atrial fibrillation, unspecified: Secondary | ICD-10-CM

## 2017-01-05 DIAGNOSIS — I2510 Atherosclerotic heart disease of native coronary artery without angina pectoris: Secondary | ICD-10-CM | POA: Diagnosis not present

## 2017-01-05 DIAGNOSIS — E118 Type 2 diabetes mellitus with unspecified complications: Secondary | ICD-10-CM

## 2017-01-05 DIAGNOSIS — F0151 Vascular dementia with behavioral disturbance: Secondary | ICD-10-CM

## 2017-01-05 DIAGNOSIS — F01518 Vascular dementia, unspecified severity, with other behavioral disturbance: Secondary | ICD-10-CM

## 2017-01-05 DIAGNOSIS — Z8673 Personal history of transient ischemic attack (TIA), and cerebral infarction without residual deficits: Secondary | ICD-10-CM | POA: Diagnosis not present

## 2017-01-05 DIAGNOSIS — E039 Hypothyroidism, unspecified: Secondary | ICD-10-CM

## 2017-01-05 NOTE — Progress Notes (Signed)
Location:  Medical illustrator of Service:  Clinic (12)  Provider: Jayleigh Notarianni L. Renato Gails, D.O., C.M.D.  Code Status: DNR Goals of Care:  Advanced Directives 01/05/2017  Does Patient Have a Medical Advance Directive? Yes  Type of Advance Directive Out of facility DNR (pink MOST or yellow form);Healthcare Power of [place];Living will  Does patient want to make changes to medical advance directive? No - Patient declined  Copy of Healthcare Power of Attorney in Chart? Yes  Would patient like information on creating a medical advance directive? -  Pre-existing out of facility DNR order (yellow form or pink MOST form) Yellow form placed in chart (order not valid for inpatient use)     Chief Complaint  Patient presents with  . Medical Management of Chronic Issues    follow-up    HPI: Patient is a 83 y.o. female seen today for medical management of chronic diseases.    DMII:  hba1c up to 8.3 which is still reasonable to a 83 yo with advanced dementia.  Avoid overdoing sweets with her.  Dementia:  Was irritable today b/c she could not hear me.  Last visit she could.  Not sure what made today different--no cerumen impaction on exam.  Staff have not reported concerns with her.  She remains on aricept 5mg  alone for her dementia due to agitation being worse with namenda.  Continues in AL enhanced.  On depakote sprinkles for her outbursts of behavior.    Hypothyroidism:  TSH has been running around 5 for several months.    H/o stroke:  On crestor for hyperlipidemia.  Not checking labs due to age and dementia, goals of care being comfort focused.  Also on zetia and baby asa.  BP controlled with cartia xt.  Constipation:  Managed with miralax daily.    Past Medical History:  Diagnosis Date  . Aortic sclerosis   . Atrial fibrillation (HCC)   . Benign hypertensive heart disease without heart failure   . Benign hypertensive kidney disease with chronic kidney disease  stage I through stage IV, or unspecified(403.10)   . Carpal tunnel syndrome   . Chronic anemia   . Chronic anemia   . Chronic renal disease, stage III (HCC)   . Coronary atherosclerosis of native coronary artery   . Diabetes mellitus with chronic kidney disease (HCC)   . Essential hypertension, benign   . H/O echocardiogram 07/19/09   EF = 60-65%  . Hyperglycemia    Mild  . Hyperlipidemia    Tolerating medication well as of 11/27/12  . Hypothyroidism   . Microalbuminuria 02/2010  . NSTEMI (non-ST elevated myocardial infarction) (HCC) 09/21/09   BM stent RCA  . Osteoarthritis   . Osteoporosis, senile   . Postsurgical percutaneous transluminal coronary angioplasty status   . Pure hypercholesterolemia   . Sinoatrial node dysfunction (HCC)   . Spinal stenosis   . Spinal stenosis of lumbar region   . Tachy-brady syndrome (HCC)    s/p PPM, battery change 08/01/09  . Vitamin D deficiency 10/2011    Past Surgical History:  Procedure Laterality Date  . CARPAL TUNNEL RELEASE Right 02/07/09  . CORONARY ANGIOPLASTY WITH STENT PLACEMENT  09/21/09   By Dr. Amil Amen. ACS, NSTEMI - BM stent RCA  . OOPHORECTOMY     With hysterectomy. One ovary removed-fibroids.  Marland Kitchen PACEMAKER GENERATOR CHANGE  08/04/09   By Dr. Amil Amen. New pacing generator is a Medtronic EnRhythm, model D1939726, serial [id number]  . PACEMAKER INSERTION  2004   Gen change 08/04/09 by Dr. Amil AmenEdmunds. New pacing generator is a Medtronic EnRhythm, model [id number], serial [id number] H  . PERCUTANEOUS CORONARY STENT INTERVENTION (PCI-S)  05/08/02   (3 tandem Cypher stent), proximal and mid LAD  . VESICOVAGINAL FISTULA CLOSURE W/ TAH      Allergies  Allergen Reactions  . Fosamax [Alendronate Sodium]   . Other Other (See Comments)    Allergic to crab meat, but no shellfish allergy (can eat lobster, shrimp, etc.)  Crab meat causes angioedema of the throat.  Marland Kitchen. Penicillins   . Sulfa Antibiotics     Outpatient Encounter Medications as of  01/05/2017  Medication Sig  . aspirin EC 81 MG tablet Take 81 mg by mouth daily.  Marland Kitchen. CARTIA XT 120 MG 24 hr capsule Take 120 mg by mouth.   . divalproex (DEPAKOTE SPRINKLE) 125 MG capsule 2 (two) times daily.   Marland Kitchen. donepezil (ARICEPT) 5 MG tablet Take 5 mg by mouth at bedtime.  Marland Kitchen. ezetimibe (ZETIA) 10 MG tablet Take 10 mg by mouth at bedtime.  Marland Kitchen. levothyroxine (SYNTHROID, LEVOTHROID) 25 MCG tablet Take 25 mcg by mouth daily before breakfast.  . MULTAQ 400 MG tablet TAKE 1 TABLET BY MOUTH TWICE DAILY  . polyethylene glycol (MIRALAX / GLYCOLAX) packet Take 17 g by mouth daily.  . ranitidine (ZANTAC) 150 MG capsule Take 150 mg by mouth daily as needed for heartburn.  . rosuvastatin (CRESTOR) 5 MG tablet Take 5 mg by mouth at bedtime.   . sodium chloride (OCEAN) 0.65 % SOLN nasal spray Place 2 sprays into both nostrils 2 (two) times daily as needed for congestion.  Marland Kitchen. acetaminophen (TYLENOL) 325 MG tablet Take 650 mg by mouth. Take 2 tablets every 6 hours as needed for pain  . [DISCONTINUED] nystatin cream (MYCOSTATIN) Apply 1 application topically daily as needed (rash).   No facility-administered encounter medications on file as of 01/05/2017.     Review of Systems:  Review of Systems  Constitutional: Negative for chills, fever and malaise/fatigue.  HENT: Positive for hearing loss. Negative for congestion.   Eyes: Negative for blurred vision.  Respiratory: Negative for shortness of breath.   Cardiovascular: Negative for chest pain and palpitations.  Gastrointestinal: Negative for abdominal pain and constipation.  Genitourinary: Negative for dysuria.  Musculoskeletal: Negative for falls.  Skin: Negative for rash.  Neurological: Negative for dizziness, loss of consciousness and weakness.  Endo/Heme/Allergies: Does not bruise/bleed easily.  Psychiatric/Behavioral: Positive for memory loss. Negative for depression.    Health Maintenance  Topic Date Due  . TETANUS/TDAP  03/19/2014  . FOOT  EXAM  04/07/2017  . PNA vac Low Risk Adult (2 of 2 - PCV13) 04/07/2017  . HEMOGLOBIN A1C  07/04/2017  . INFLUENZA VACCINE  Completed    Physical Exam: Vitals:   01/05/17 1107  BP: 138/70  Pulse: 63  Temp: 98.6 F (37 C)  TempSrc: Oral  SpO2: 96%  Weight: 133 lb (60.3 kg)   Body mass index is 29.82 kg/m. Physical Exam  Constitutional: She appears well-developed and well-nourished. No distress.  HENT:  Head: Normocephalic and atraumatic.  Cardiovascular: Intact distal pulses.  irreg irreg  Pulmonary/Chest: Effort normal and breath sounds normal. No respiratory distress.  Abdominal: Soft. Bowel sounds are normal.  Musculoskeletal: Normal range of motion.  Neurological: She is alert.  Oriented to regular family and staff only and self  Skin: Skin is warm and dry.  Psychiatric:  Irritable that she can't hear me today  Labs reviewed: Basic Metabolic Panel: Recent Labs    04/05/16 0300 08/02/16 0600 01/03/17 0700  NA  --  143 138  K  --  5.3 5.4*  BUN  --  24* 26*  CREATININE  --  1.3* 1.4*  TSH 5.95* 4.70 4.70   Liver Function Tests: No results for input(s): AST, ALT, ALKPHOS, BILITOT, PROT, ALBUMIN in the last 8760 hours. No results for input(s): LIPASE, AMYLASE in the last 8760 hours. No results for input(s): AMMONIA in the last 8760 hours. CBC: No results for input(s): WBC, NEUTROABS, HGB, HCT, MCV, PLT in the last 8760 hours. Lipid Panel: Recent Labs    04/05/16 0300  CHOL 135  HDL 48  LDLCALC 60  TRIG 137   Lab Results  Component Value Date   HGBA1C 8.3 01/03/2017   Assessment/Plan 1. Type 2 diabetes mellitus with complication, without long-term current use of insulin (HCC) -control satisfactory for age and multimorbidity -cont to watch sweets as able, no meds for sugar -cont asa, statin due to cva  2. Chronic atrial fibrillation (HCC) -cont baby asa, cartia xt, no issues with bleeding or rate control  3. Vascular dementia with behavior  disturbance -advanced, receiving AL enhanced level of care and fairly stable  4. Acquired hypothyroidism -TSH running high for several months, I've decided to up levothyroxine to 37.385mcg daily and recheck tsh in 6 wks  5. CAD in native artery -cont secondary prevention with bp, lipid and glucose control as able with her dementia  6. History of stroke -cont secondary prevention  Labs/tests ordered:  Cbc, cmp, hba1c Next appt:  6 mos med mgt, labs before  Wannetta Langland L. Rocsi Hazelbaker, D.O. Geriatrics [facility] [address] [place] [place], [place] [address] Cell Phone (Mon-Fri 8am-5pm):  [phone] On Call:  [phone] & follow prompts after 5pm & weekends Office Phone:  [phone] Office Fax:  [phone]

## 2017-02-16 DIAGNOSIS — E039 Hypothyroidism, unspecified: Secondary | ICD-10-CM | POA: Diagnosis not present

## 2017-02-16 LAB — TSH: TSH: 3.08 (ref <=5.90)

## 2017-06-12 DIAGNOSIS — M545 Low back pain: Secondary | ICD-10-CM | POA: Diagnosis not present

## 2017-06-14 ENCOUNTER — Non-Acute Institutional Stay: Payer: Medicare Other | Admitting: Internal Medicine

## 2017-06-14 ENCOUNTER — Encounter: Payer: Self-pay | Admitting: Internal Medicine

## 2017-06-14 DIAGNOSIS — M533 Sacrococcygeal disorders, not elsewhere classified: Secondary | ICD-10-CM | POA: Diagnosis not present

## 2017-06-14 DIAGNOSIS — R2689 Other abnormalities of gait and mobility: Secondary | ICD-10-CM | POA: Diagnosis not present

## 2017-06-14 NOTE — Progress Notes (Signed)
Patient ID: [name], female   DOB: 01/01/1919, 84 y.o.   MRN: [id number]  Location:  [facility] Room Number: 633 Place of Service:  ALF [phone]) Provider:   Kermit Balo, DO  Patient Care Team: Kermit Balo, DO as PCP - General (Geriatric Medicine) Early, Kristen Loader, MD as Consulting Physician (Vascular Surgery) Ranelle Oyster, MD as Consulting Physician (Physical Medicine and Rehabilitation) Micki Riley, MD as Consulting Physician (Neurology) Hillis Range, MD as Consulting Physician (Cardiology) Mckinley Jewel, MD as Consulting Physician (Ophthalmology) Salvatore Marvel, MD as Consulting Physician (Orthopedic Surgery) Marcine Matar, MD as Consulting Physician (Urology)  Extended Emergency Contact Information Primary Emergency Contact: Shirlean Kelly Address: [address]          [place], [place] Home Phone: [phone] Relation: Son Secondary Emergency Contact: Despina [place] Mobile Phone: [phone] Relation: Other  Code Status:  DNR Goals of care: Advanced Directive information Advanced Directives 06/14/2017  Does Patient Have a Medical Advance Directive? Yes  Type of Estate agent of [place];Out of facility DNR (pink MOST or yellow form);Living will  Does patient want to make changes to medical advance directive? No - Patient declined  Copy of Healthcare Power of Attorney in Chart? Yes  Would patient like information on creating a medical advance directive? -  Pre-existing out of facility DNR order (yellow form or pink MOST form) Yellow form placed in chart (order not valid for inpatient use)     Chief Complaint  Patient presents with  . Acute Visit    change in condition, back pain    HPI:  Pt is a 84 y.o. female seen today for an acute visit for back pain.  Mrs. Gatchell has a h/o progressive dementia, DMII with CKD, CAD, HTN, anemia,  afib, aortic sclerosis and some prior low back pain with known DDD and foraminal stenosis (but not recently bothersome).  She was walking back from her meal on Friday and suddenly developed back pain.  She has since not been getting up out of bed, requesting staff to help her to get drinks at bedside and to help her to move in the bed.  She denies pain when resting in bed lying flat.  She reports pain when we attempt to sit her up, but cannot tell us exactly where.  She yelled out in pain when her CNA helped to turn her and touched her over the left iliac crest and SI region and when I palpated that during exam.  She had no paravertebral or spinal tenderness and no tenderness over either trochanteric bursa.  She was moving her extremities normally.  She was given a 3 day course of scheduled tylenol alternating with ibuprofen which was not relieving her pain or allowing her to get up oob so prn tramadol and prn robaxin were added.  It appears she has not been getting these regularly though b/c she does not think to ask for them (due to her memory impairment and she's not hurting if she does not try to get up).  She reports, "I need a back doctor".  Lumbar spine xrays were reviewed and continue to show her degenerative disc disease, foraminal stenosis, osteopenia and lumbar scoliosis but no compression fxs or new pathology.     Past Medical History:  Diagnosis Date  . Aortic sclerosis   . Atrial fibrillation (HCC)   . Benign hypertensive heart disease without heart failure   .  Benign hypertensive kidney disease with chronic kidney disease stage I through stage IV, or unspecified(403.10)   . Carpal tunnel syndrome   . Chronic anemia   . Chronic anemia   . Chronic renal disease, stage III (HCC)   . Coronary atherosclerosis of native coronary artery   . Diabetes mellitus with chronic kidney disease (HCC)   . Essential hypertension, benign   . H/O echocardiogram 07/19/09   EF = 60-65%  . Hyperglycemia     Mild  . Hyperlipidemia    Tolerating medication well as of 11/27/12  . Hypothyroidism   . Microalbuminuria 02/2010  . NSTEMI (non-ST elevated myocardial infarction) (HCC) 09/21/09   BM stent RCA  . Osteoarthritis   . Osteoporosis, senile   . Postsurgical percutaneous transluminal coronary angioplasty status   . Pure hypercholesterolemia   . Sinoatrial node dysfunction (HCC)   . Spinal stenosis   . Spinal stenosis of lumbar region   . Tachy-brady syndrome (HCC)    s/p PPM, battery change 08/01/09  . Vitamin D deficiency 10/2011   Past Surgical History:  Procedure Laterality Date  . CARPAL TUNNEL RELEASE Right 02/07/09  . CORONARY ANGIOPLASTY WITH STENT PLACEMENT  09/21/09   By Dr. Amil Amen. ACS, NSTEMI - BM stent RCA  . OOPHORECTOMY     With hysterectomy. One ovary removed-fibroids.  Marland Kitchen PACEMAKER GENERATOR CHANGE  08/04/09   By Dr. Amil Amen. New pacing generator is a Medtronic EnRhythm, model D1939726, serial [id number]  . PACEMAKER INSERTION  2004   Gen change 08/04/09 by Dr. Amil Amen. New pacing generator is a Medtronic EnRhythm, model D1939726, serial [id number]  . PERCUTANEOUS CORONARY STENT INTERVENTION (PCI-S)  05/08/02   (3 tandem Cypher stent), proximal and mid LAD  . VESICOVAGINAL FISTULA CLOSURE W/ TAH      Allergies  Allergen Reactions  . Fosamax [Alendronate Sodium]   . Other Other (See Comments)    Allergic to crab meat, but no shellfish allergy (can eat lobster, shrimp, etc.)  Crab meat causes angioedema of the throat.  Marland Kitchen Penicillins   . Sulfa Antibiotics     Outpatient Encounter Medications as of 06/14/2017  Medication Sig  . acetaminophen (TYLENOL) 325 MG tablet Take 650 mg by mouth 3 (three) times daily as needed (back pain).   Marland Kitchen aspirin EC 81 MG tablet Take 81 mg by mouth daily.  . bisacodyl (DULCOLAX) 10 MG suppository Place 10 mg rectally as needed for moderate constipation.  Marland Kitchen CARTIA XT 120 MG 24 hr capsule Take 120 mg by mouth.   . divalproex (DEPAKOTE  SPRINKLE) 125 MG capsule 2 (two) times daily.   Marland Kitchen donepezil (ARICEPT) 5 MG tablet Take 5 mg by mouth at bedtime.  Marland Kitchen ezetimibe (ZETIA) 10 MG tablet Take 10 mg by mouth at bedtime.  Marland Kitchen ibuprofen (ADVIL,MOTRIN) 400 MG tablet Take 400 mg by mouth 3 (three) times daily as needed (back pain).  Marland Kitchen levothyroxine (SYNTHROID, LEVOTHROID) 75 MCG tablet Take 37.5 mcg by mouth daily before breakfast.  . methocarbamol (ROBAXIN) 500 MG tablet Take 250 mg by mouth every 8 (eight) hours as needed for muscle spasms.  . MULTAQ 400 MG tablet TAKE 1 TABLET BY MOUTH TWICE DAILY  . polyethylene glycol (MIRALAX / GLYCOLAX) packet Take 17 g by mouth daily.  . ranitidine (ZANTAC) 150 MG capsule Take 150 mg by mouth daily as needed for heartburn.  . rosuvastatin (CRESTOR) 5 MG tablet Take 5 mg by mouth at bedtime.   . sodium chloride (OCEAN) 0.65 % SOLN nasal  spray Place 2 sprays into both nostrils 2 (two) times daily as needed for congestion.  . traMADol (ULTRAM) 50 MG tablet Take 25 mg by mouth every 6 (six) hours as needed (back pain). Take 1 tablet by mouth every 6 hours as needed pain scaled 6-10  . [DISCONTINUED] levothyroxine (SYNTHROID, LEVOTHROID) 25 MCG tablet Take 25 mcg by mouth daily before breakfast.   No facility-administered encounter medications on file as of 06/14/2017.     Review of Systems  Constitutional: Positive for activity change. Negative for appetite change, chills, fatigue and fever.  HENT: Positive for hearing loss. Negative for congestion.   Eyes: Negative for visual disturbance.  Respiratory: Negative for chest tightness and shortness of breath.   Cardiovascular: Negative for chest pain and leg swelling.  Gastrointestinal: Negative for abdominal pain, constipation, diarrhea and nausea.  Genitourinary: Negative for dysuria and flank pain.  Musculoskeletal: Positive for arthralgias, back pain and myalgias.  Neurological: Negative for dizziness and weakness.  Hematological: Does not  bruise/bleed easily.  Psychiatric/Behavioral: Positive for agitation, behavioral problems and confusion. Negative for sleep disturbance.    Immunization History  Administered Date(s) Administered  . Influenza Inj Mdck Quad Pf 11/06/2015  . Influenza-Unspecified 11/09/2012, 10/31/2014, 11/08/2016  . Pneumococcal Polysaccharide-23 03/23/2005, 04/07/2016  . Td 03/18/2004  . Zoster 08/07/2007  . Zoster Recombinat (Shingrix) 02/21/2017   Pertinent  Health Maintenance Due  Topic Date Due  . FOOT EXAM  04/07/2017  . PNA vac Low Risk Adult (2 of 2 - PCV13) 04/07/2017  . HEMOGLOBIN A1C  07/04/2017  . INFLUENZA VACCINE  08/18/2017   Fall Risk  01/05/2017 08/25/2016 06/30/2016 01/07/2016 04/30/2015  Falls in the past year? No No No Yes Yes  Number falls in past yr: - - - 1 1  Injury with Fall? - - - No No  Risk for fall due to : - - - - -   Functional Status Survey:    Vitals:   06/14/17 1038  BP: (!) 144/79  Pulse: 81  Resp: 20  Temp: 97.8 F (36.6 C)  TempSrc: Oral  SpO2: 94%  Weight: 129 lb (58.5 kg)  Height:  (1.422 m)   Body mass index is 28.92 kg/m. Physical Exam  Constitutional: She appears well-developed and well-nourished. No distress.  HENT:  Very HOH  Cardiovascular: Intact distal pulses.  Murmur heard. irreg irreg, systolic murmur left 2nd ics  Pulmonary/Chest: Effort normal and breath sounds normal. No respiratory distress. She has no rales.  Abdominal: Soft. Bowel sounds are normal. She exhibits no distension. There is no tenderness.  Musculoskeletal: Normal range of motion. She exhibits tenderness.  Over left iliac crest and SI region only  Neurological: She is alert.  Skin: Skin is warm and dry.  Psychiatric:  No change in her mood    Labs reviewed: Recent Labs    08/02/16 0600 01/03/17  NA 143 138  K 5.3 5.4*  BUN 24* 26*  CREATININE 1.3* 1.4*   No results for input(s): AST, ALT, ALKPHOS, BILITOT, PROT, ALBUMIN in the last 8760 hours. No  results for input(s): WBC, NEUTROABS, HGB, HCT, MCV, PLT in the last 8760 hours. Lab Results  Component Value Date   TSH 3.08 02/16/2017   Lab Results  Component Value Date   HGBA1C 8.3 01/03/2017   Lab Results  Component Value Date   CHOL 135 04/05/2016   HDL 48 04/05/2016   LDLCALC 60 04/05/2016   TRIG 137 04/05/2016   CHOLHDL 2.9 05/09/2014  Significant Diagnostic Results in last 30 days:  Lumbar xray per hpi  Assessment/Plan 1. Pain of left sacroiliac joint -obtain bilateral hips and pelvis xrays -schedule tramadol, tylenol and ibuprofen for 3 days -cont to work on getting her up oob -also cont prn robaxin and prn heat or ice -if still not improving, may need CT to further assess  2.  Decreased mobility -clearly seems related to her pain preventing her from getting into a seated position -do above and see how she does -also at risk of needing a higher level of care or at least increased caregiver hours in her AL enhanced environment  Family/ staff Communication: spoke with Dr. Newell Coral by phone, also d/w nurse manager, AL nursing, CNA  Labs/tests ordered:  Bilateral hips and pelvis due to area of tenderness and immobiity  Arella Blinder L. Shaynna Husby, D.O. Geriatrics [facility] [address], [place] [address] Cell Phone (Mon-Fri 8am-5pm):  [phone] On Call:  [phone] & follow prompts after 5pm & weekends Office Phone:  [phone] Office Fax:  [phone]

## 2017-06-15 DIAGNOSIS — M25551 Pain in right hip: Secondary | ICD-10-CM | POA: Diagnosis not present

## 2017-06-15 DIAGNOSIS — M25552 Pain in left hip: Secondary | ICD-10-CM | POA: Diagnosis not present

## 2017-06-21 ENCOUNTER — Encounter: Payer: Self-pay | Admitting: Internal Medicine

## 2017-06-21 ENCOUNTER — Other Ambulatory Visit: Payer: Self-pay | Admitting: Internal Medicine

## 2017-06-21 ENCOUNTER — Non-Acute Institutional Stay (SKILLED_NURSING_FACILITY): Payer: Medicare Other | Admitting: Internal Medicine

## 2017-06-21 DIAGNOSIS — G8191 Hemiplegia, unspecified affecting right dominant side: Secondary | ICD-10-CM | POA: Diagnosis not present

## 2017-06-21 DIAGNOSIS — M533 Sacrococcygeal disorders, not elsewhere classified: Secondary | ICD-10-CM | POA: Diagnosis not present

## 2017-06-21 DIAGNOSIS — I482 Chronic atrial fibrillation, unspecified: Secondary | ICD-10-CM

## 2017-06-21 DIAGNOSIS — F0151 Vascular dementia with behavioral disturbance: Secondary | ICD-10-CM | POA: Diagnosis not present

## 2017-06-21 DIAGNOSIS — M545 Low back pain: Secondary | ICD-10-CM

## 2017-06-21 DIAGNOSIS — E118 Type 2 diabetes mellitus with unspecified complications: Secondary | ICD-10-CM | POA: Diagnosis not present

## 2017-06-21 DIAGNOSIS — F01518 Vascular dementia, unspecified severity, with other behavioral disturbance: Secondary | ICD-10-CM

## 2017-06-21 DIAGNOSIS — R102 Pelvic and perineal pain: Secondary | ICD-10-CM

## 2017-06-21 NOTE — Progress Notes (Signed)
Patient ID: [name], female   DOB: [birth year], 84 y.o.   MRN: [id number]  Provider:  Gwenith Spitz. Renato Gails, D.O., C.M.D. Location:  [facility] Room Number: 155 Rehab Place of Service:  SNF (31)  PCP: Kermit Balo, DO Patient Care Team: Kermit Balo, DO as PCP - General (Geriatric Medicine) Early, Kristen Loader, MD as Consulting Physician (Vascular Surgery) Ranelle Oyster, MD as Consulting Physician (Physical Medicine and Rehabilitation) Micki Riley, MD as Consulting Physician (Neurology) Hillis Range, MD as Consulting Physician (Cardiology) Mckinley Jewel, MD as Consulting Physician (Ophthalmology) Salvatore Marvel, MD as Consulting Physician (Orthopedic Surgery) Marcine Matar, MD as Consulting Physician (Urology)  Extended Emergency Contact Information Primary Emergency Contact: Shirlean Kelly Address: [address]          [place], [place] Home Phone: [phone] Relation: Son Secondary Emergency Contact: Despina [place] Mobile Phone: [phone] Relation: Other  Code Status: DNR Goals of Care: Advanced Directive information Advanced Directives 06/21/2017  Does Patient Have a Medical Advance Directive? Yes  Type of Advance Directive Out of facility DNR (pink MOST or yellow form);Living will;Healthcare Power of Attorney  Does patient want to make changes to medical advance directive? No - Patient declined  Copy of Healthcare Power of Attorney in Chart? Yes  Would patient like information on creating a medical advance directive? -  Pre-existing out of facility DNR order (yellow form or pink MOST form) Yellow form placed in chart (order not valid for inpatient use)   Chief Complaint  Patient presents with  . New Admit To SNF    Rehab admission    HPI: Patient is a 84 y.o. female with h/o dementia, diabetes, afib, htn, ckd, seen today for admission to rehab due to ongoing  back pain preventing physical activity.  She's not been getting out of bed.  See my previous note for more details of initial mgt of her back pain.  In summary, she's had two 72 hour courses of scheduled pain medications initially with tylenol and ibuprofen, heat and ice, later with tramadol.  She's also had prn muscle relaxants.  She did not improve.  She also had xrays of her lumbar spine with chronic degenerative changes and then xrays of her hips and pelvis w/o fractures.  She was transferred from AL enhanced to rehab due to increased functional needs with her immobility.  She's had no known injury.  She yells out when she is helped to sit up.  Pain is not localizing as well to her left iliac crest and SI region as it did last week, now more generalized over lower lumbar and sacral region, but dull and bothersome enough to keep her from sitting all the way up.  Attempt was made to transfer her with the hoyer to the chair.  She looked ok during the transfer process, but then said "this is terrible" once she was seated.  RN tried to arrange her pillow behind her and adjust her feet, but she remained agitated and threw the pillow.  She then needed to use the bedpan soon after getting up and wanted to stay in bed after that.  Therapy was coming to work with her later today.      Past Medical History:  Diagnosis Date  . Aortic sclerosis   . Atrial fibrillation (HCC)   . Benign hypertensive heart disease without heart failure   . Benign hypertensive kidney disease with chronic kidney disease stage  I through stage IV, or unspecified(403.10)   . Carpal tunnel syndrome   . Chronic anemia   . Chronic anemia   . Chronic renal disease, stage III (HCC)   . Coronary atherosclerosis of native coronary artery   . Diabetes mellitus with chronic kidney disease (HCC)   . Essential hypertension, benign   . H/O echocardiogram 07/19/09   EF = 60-65%  . Hyperglycemia    Mild  . Hyperlipidemia    Tolerating medication  well as of 11/27/12  . Hypothyroidism   . Microalbuminuria 02/2010  . NSTEMI (non-ST elevated myocardial infarction) (HCC) 09/21/09   BM stent RCA  . Osteoarthritis   . Osteoporosis, senile   . Postsurgical percutaneous transluminal coronary angioplasty status   . Pure hypercholesterolemia   . Sinoatrial node dysfunction (HCC)   . Spinal stenosis   . Spinal stenosis of lumbar region   . Tachy-brady syndrome (HCC)    s/p PPM, battery change 08/01/09  . Vitamin D deficiency 10/2011   Past Surgical History:  Procedure Laterality Date  . CARPAL TUNNEL RELEASE Right 02/07/09  . CORONARY ANGIOPLASTY WITH STENT PLACEMENT  09/21/09   By Dr. Amil AmenEdmunds. ACS, NSTEMI - BM stent RCA  . OOPHORECTOMY     With hysterectomy. One ovary removed-fibroids.  Marland Kitchen. PACEMAKER GENERATOR CHANGE  08/04/09   By Dr. Amil AmenEdmunds. New pacing generator is a Medtronic EnRhythm, model [id number], serial [id number]  . PACEMAKER INSERTION  2004   Gen change 08/04/09 by Dr. Amil AmenEdmunds. New pacing generator is a Medtronic EnRhythm, model [id number], serial [id number]  . PERCUTANEOUS CORONARY STENT INTERVENTION (PCI-S)  05/08/02   (3 tandem Cypher stent), proximal and mid LAD  . VESICOVAGINAL FISTULA CLOSURE W/ TAH      reports that she has never smoked. She has never used smokeless tobacco. She reports that she drinks alcohol. She reports that she does not use drugs. Social History   Socioeconomic History  . Marital status: Widowed    Spouse name: Not on file  . Number of children: 3  . Years of education: Not on file  . Highest education level: Not on file  Occupational History    Employer: RETIRED  Social Needs  . Financial resource strain: Not on file  . Food insecurity:    Worry: Not on file    Inability: Not on file  . Transportation needs:    Medical: Not on file    Non-medical: Not on file  Tobacco Use  . Smoking status: Never Smoker  . Smokeless tobacco: Never Used  Substance and Sexual Activity  . Alcohol use: Yes      Comment: occasional glass of wine  . Drug use: No  . Sexual activity: Never  Lifestyle  . Physical activity:    Days per week: Not on file    Minutes per session: Not on file  . Stress: Not on file  Relationships  . Social connections:    Talks on phone: Not on file    Gets together: Not on file    Attends religious service: Not on file    Active member of club or organization: Not on file    Attends meetings of clubs or organizations: Not on file    Relationship status: Not on file  . Intimate partner violence:    Fear of current or ex partner: Not on file    Emotionally abused: Not on file    Physically abused: Not on file    Forced sexual activity: Not on  file  Other Topics Concern  . Not on file  Social History Narrative   Lives at [place] 10/07/2014   Widow   Never smoked   Alcohol none   Exercise none   DNR, POA, Living Will   Walks with walker    Functional Status Survey:    Family History  Problem Relation Age of Onset  . Heart disease Mother   . Heart disease Father   . Diabetes Father   . Peripheral vascular disease Brother   . Cerebral palsy Son     Health Maintenance  Topic Date Due  . Janet Berlin  03/19/2014  . FOOT EXAM  04/07/2017  . PNA vac Low Risk Adult (2 of 2 - PCV13) 04/07/2017  . HEMOGLOBIN A1C  07/04/2017  . INFLUENZA VACCINE  08/18/2017    Allergies  Allergen Reactions  . Fosamax [Alendronate Sodium]   . Other Other (See Comments)    Allergic to crab meat, but no shellfish allergy (can eat lobster, shrimp, etc.)  Crab meat causes angioedema of the throat.  Marland Kitchen Penicillins   . Sulfa Antibiotics     Outpatient Encounter Medications as of 06/21/2017  Medication Sig  . aspirin EC 81 MG tablet Take 81 mg by mouth daily.  Marland Kitchen CARTIA XT 120 MG 24 hr capsule Take 120 mg by mouth.   . divalproex (DEPAKOTE SPRINKLE) 125 MG capsule 2 (two) times daily.   Marland Kitchen donepezil (ARICEPT) 5 MG tablet Take 5 mg by mouth at bedtime.  Marland Kitchen ezetimibe  (ZETIA) 10 MG tablet Take 10 mg by mouth at bedtime.  Marland Kitchen levothyroxine (SYNTHROID, LEVOTHROID) 75 MCG tablet Take 37.5 mcg by mouth daily before breakfast.  . methocarbamol (ROBAXIN) 500 MG tablet Take 250 mg by mouth every 8 (eight) hours as needed for muscle spasms.  . MULTAQ 400 MG tablet TAKE 1 TABLET BY MOUTH TWICE DAILY  . polyethylene glycol (MIRALAX / GLYCOLAX) packet Take 17 g by mouth daily.  . ranitidine (ZANTAC) 150 MG capsule Take 150 mg by mouth daily as needed for heartburn.  . rosuvastatin (CRESTOR) 5 MG tablet Take 5 mg by mouth at bedtime.   . sodium chloride (OCEAN) 0.65 % SOLN nasal spray Place 2 sprays into both nostrils 2 (two) times daily as needed for congestion.  . traMADol (ULTRAM) 50 MG tablet Take 25 mg by mouth every 6 (six) hours as needed (back pain). Take 1 tablet by mouth every 6 hours as needed pain scaled 6-10  . [DISCONTINUED] acetaminophen (TYLENOL) 325 MG tablet Take 650 mg by mouth 3 (three) times daily as needed (back pain).   . [DISCONTINUED] bisacodyl (DULCOLAX) 10 MG suppository Place 10 mg rectally as needed for moderate constipation.  . [DISCONTINUED] ibuprofen (ADVIL,MOTRIN) 400 MG tablet Take 400 mg by mouth 3 (three) times daily as needed (back pain).   No facility-administered encounter medications on file as of 06/21/2017.     Review of Systems  Constitutional: Positive for activity change. Negative for appetite change, chills, fatigue and fever.  HENT: Positive for hearing loss. Negative for congestion.   Eyes: Negative for visual disturbance.  Respiratory: Negative for chest tightness and shortness of breath.   Cardiovascular: Negative for leg swelling.  Gastrointestinal: Negative for abdominal pain and constipation.  Genitourinary: Negative for dysuria.  Musculoskeletal: Positive for back pain and gait problem.  Neurological: Positive for weakness. Negative for dizziness.  Hematological: Bruises/bleeds easily.  Psychiatric/Behavioral:  Negative for confusion.    Vitals:   06/21/17 1096  BP: 134/70  Pulse: 60  Resp: 18  Temp: 98.4 F (36.9 C)  TempSrc: Oral  SpO2: 96%  Weight: 129 lb (58.5 kg)  Height: 4\' 8"  (1.422 m)   Body mass index is 28.92 kg/m. Physical Exam  Constitutional: She appears well-developed. No distress.  HENT:  Head: Normocephalic and atraumatic.  Cardiovascular: Intact distal pulses.  irreg irreg  Pulmonary/Chest: Effort normal and breath sounds normal. No respiratory distress.  Abdominal: Bowel sounds are normal.  Musculoskeletal:  Postural weakness, yells in pain when we attempt to sit her up in the bed; tender over entire sacral region today  Neurological: She is alert.  Skin: Skin is warm and dry.  Psychiatric:  Very agitated when in pain and when she cannot hear what's said to her    Labs reviewed: Basic Metabolic Panel: Recent Labs    08/02/16 0600 01/03/17  NA 143 138  K 5.3 5.4*  BUN 24* 26*  CREATININE 1.3* 1.4*   BNP: Invalid input(s): POCBNP Lab Results  Component Value Date   HGBA1C 8.3 01/03/2017   Lab Results  Component Value Date   TSH 3.08 02/16/2017   Assessment/Plan 1. Sacral back pain -ongoing, schedule tylenol 650mg  and  tramadol 50mg  po tid now, heat or ice qid to affected area, try to get up oob at least daily, to get PT, OT -ordered CT sacrum, pelvis and lumbar spine due to xrays being unrevealing and pain not getting better  2. Right hemiparesis (HCC) -s/p past stroke years, mild weakness  3. Type 2 diabetes mellitus with complication, without long-term current use of insulin (HCC) -well-controlled, not on meds  4. Chronic atrial fibrillation (HCC) -cont asa,, diltiazem therapy  5. Vascular dementia with behavior disturbance -with severe agitation at times worse when she's having pain -continues on aricept low dose at hs and depakote sprinkles bid for mood stability   Family/ staff Communication: discussed with rehab nurse and nurse  manager, CNAs  Labs/tests ordered:  CT pelvis and sacrum, lumbar spine  Jehan Ranganathan L. Irisha Grandmaison, D.O. Geriatrics [facility] [address], [place] [address] Cell Phone (Mon-Fri 8am-5pm):  [phone] On Call:  [phone] & follow prompts after 5pm & weekends Office Phone:  [phone] Office Fax:  [phone]

## 2017-06-23 ENCOUNTER — Ambulatory Visit
Admission: RE | Admit: 2017-06-23 | Discharge: 2017-06-23 | Disposition: A | Discharge status: Home / Self Care | Payer: Medicare Other | Source: Ambulatory Visit | Attending: Internal Medicine | Admitting: Internal Medicine

## 2017-06-23 DIAGNOSIS — R279 Unspecified lack of coordination: Secondary | ICD-10-CM | POA: Diagnosis not present

## 2017-06-23 DIAGNOSIS — R102 Pelvic and perineal pain: Secondary | ICD-10-CM | POA: Diagnosis not present

## 2017-06-23 DIAGNOSIS — M549 Dorsalgia, unspecified: Secondary | ICD-10-CM | POA: Diagnosis not present

## 2017-06-23 DIAGNOSIS — S3993XA Unspecified injury of pelvis, initial encounter: Secondary | ICD-10-CM | POA: Diagnosis not present

## 2017-06-23 DIAGNOSIS — Z743 Need for continuous supervision: Secondary | ICD-10-CM | POA: Diagnosis not present

## 2017-06-23 DIAGNOSIS — M545 Low back pain: Secondary | ICD-10-CM

## 2017-06-23 DIAGNOSIS — S3992XA Unspecified injury of lower back, initial encounter: Secondary | ICD-10-CM | POA: Diagnosis not present

## 2017-06-24 ENCOUNTER — Non-Acute Institutional Stay (SKILLED_NURSING_FACILITY): Payer: Medicare Other | Admitting: Adult Health

## 2017-06-24 ENCOUNTER — Encounter: Payer: Self-pay | Admitting: Adult Health

## 2017-06-24 DIAGNOSIS — R1110 Vomiting, unspecified: Secondary | ICD-10-CM | POA: Diagnosis not present

## 2017-06-24 DIAGNOSIS — R2689 Other abnormalities of gait and mobility: Secondary | ICD-10-CM | POA: Diagnosis not present

## 2017-06-24 DIAGNOSIS — R278 Other lack of coordination: Secondary | ICD-10-CM | POA: Diagnosis not present

## 2017-06-24 DIAGNOSIS — M48061 Spinal stenosis, lumbar region without neurogenic claudication: Secondary | ICD-10-CM | POA: Diagnosis not present

## 2017-06-24 DIAGNOSIS — R569 Unspecified convulsions: Secondary | ICD-10-CM | POA: Diagnosis not present

## 2017-06-24 DIAGNOSIS — M545 Low back pain: Secondary | ICD-10-CM | POA: Diagnosis not present

## 2017-06-24 DIAGNOSIS — I129 Hypertensive chronic kidney disease with stage 1 through stage 4 chronic kidney disease, or unspecified chronic kidney disease: Secondary | ICD-10-CM | POA: Diagnosis not present

## 2017-06-24 DIAGNOSIS — E119 Type 2 diabetes mellitus without complications: Secondary | ICD-10-CM | POA: Diagnosis not present

## 2017-06-24 DIAGNOSIS — M533 Sacrococcygeal disorders, not elsewhere classified: Secondary | ICD-10-CM | POA: Diagnosis not present

## 2017-06-24 DIAGNOSIS — M62562 Muscle wasting and atrophy, not elsewhere classified, left lower leg: Secondary | ICD-10-CM | POA: Diagnosis not present

## 2017-06-24 DIAGNOSIS — R112 Nausea with vomiting, unspecified: Secondary | ICD-10-CM | POA: Diagnosis not present

## 2017-06-24 DIAGNOSIS — R42 Dizziness and giddiness: Secondary | ICD-10-CM | POA: Diagnosis not present

## 2017-06-24 DIAGNOSIS — M62561 Muscle wasting and atrophy, not elsewhere classified, right lower leg: Secondary | ICD-10-CM | POA: Diagnosis not present

## 2017-06-24 DIAGNOSIS — F0151 Vascular dementia with behavioral disturbance: Secondary | ICD-10-CM | POA: Diagnosis not present

## 2017-06-24 DIAGNOSIS — I69351 Hemiplegia and hemiparesis following cerebral infarction affecting right dominant side: Secondary | ICD-10-CM | POA: Diagnosis not present

## 2017-06-24 DIAGNOSIS — D649 Anemia, unspecified: Secondary | ICD-10-CM | POA: Diagnosis not present

## 2017-06-24 DIAGNOSIS — I10 Essential (primary) hypertension: Secondary | ICD-10-CM | POA: Diagnosis not present

## 2017-06-24 DIAGNOSIS — E0822 Diabetes mellitus due to underlying condition with diabetic chronic kidney disease: Secondary | ICD-10-CM | POA: Diagnosis not present

## 2017-06-24 DIAGNOSIS — R110 Nausea: Secondary | ICD-10-CM | POA: Diagnosis not present

## 2017-06-24 DIAGNOSIS — M6389 Disorders of muscle in diseases classified elsewhere, multiple sites: Secondary | ICD-10-CM | POA: Diagnosis not present

## 2017-06-24 NOTE — Progress Notes (Signed)
Location:  Medical illustrator of Service:  SNF (31) Provider:   [name], ANP [facility] [phone]   Kermit Balo, DO  Patient Care Team: Kermit Balo, DO as PCP - General (Geriatric Medicine) Early, Kristen Loader, MD as Consulting Physician (Vascular Surgery) Ranelle Oyster, MD as Consulting Physician (Physical Medicine and Rehabilitation) Micki Riley, MD as Consulting Physician (Neurology) Hillis Range, MD as Consulting Physician (Cardiology) Mckinley Jewel, MD as Consulting Physician (Ophthalmology) Salvatore Marvel, MD as Consulting Physician (Orthopedic Surgery) Marcine Matar, MD as Consulting Physician (Urology)  Extended Emergency Contact Information Primary Emergency Contact: Shirlean Kelly Address: [address]          [place], [place] Home Phone: [phone] Relation: Son Secondary Emergency Contact: Despina [place] Mobile Phone: [phone] Relation: Other  Code Status:  DNR Goals of care: Advanced Directive information Advanced Directives 06/21/2017  Does Patient Have a Medical Advance Directive? Yes  Type of Advance Directive Out of facility DNR (pink MOST or yellow form);Living will;Healthcare Power of Attorney  Does patient want to make changes to medical advance directive? No - Patient declined  Copy of Healthcare Power of Attorney in Chart? Yes  Would patient like information on creating a medical advance directive? -  Pre-existing out of facility DNR order (yellow form or pink MOST form) Yellow form placed in chart (order not valid for inpatient use)     Chief Complaint  Patient presents with  . Acute Visit    nausea/vomiting    HPI:  Pt is a 84 y.o. female seen today for an acute visit for nausea and vomiting. The staff report that [name] has had two episodes of nausea and vomiting small amts. She has not had abd pain or fever. The  resident resides in rehab after a transition from AL due to back pain. A CT of the pelvis and spine was obtained on 06/23/17 and showed severe lumbar spinal stenosis. There was a unremarkable bowel loop patterns noted.  The resident denies any current symptoms and ate breakfast.  She has had some issues with intake due to food dislike and the staff report she may not have been taking food the ultram that was scheduled to control her pain. Her pain is now well controlled with the current regimen to her back.    Past Medical History:  Diagnosis Date  . Aortic sclerosis   . Atrial fibrillation (HCC)   . Benign hypertensive heart disease without heart failure   . Benign hypertensive kidney disease with chronic kidney disease stage I through stage IV, or unspecified(403.10)   . Carpal tunnel syndrome   . Chronic anemia   . Chronic anemia   . Chronic renal disease, stage III (HCC)   . Coronary atherosclerosis of native coronary artery   . Diabetes mellitus with chronic kidney disease (HCC)   . Essential hypertension, benign   . H/O echocardiogram 07/19/09   EF = 60-65%  . Hyperglycemia    Mild  . Hyperlipidemia    Tolerating medication well as of 11/27/12  . Hypothyroidism   . Microalbuminuria 02/2010  . NSTEMI (non-ST elevated myocardial infarction) (HCC) 09/21/09   BM stent RCA  . Osteoarthritis   . Osteoporosis, senile   . Postsurgical percutaneous transluminal coronary angioplasty status   . Pure hypercholesterolemia   . Sinoatrial node dysfunction (HCC)   . Spinal stenosis   . Spinal stenosis of lumbar region   .  Tachy-brady syndrome (HCC)    s/p PPM, battery change 08/01/09  . Vitamin D deficiency 10/2011   Past Surgical History:  Procedure Laterality Date  . CARPAL TUNNEL RELEASE Right 02/07/09  . CORONARY ANGIOPLASTY WITH STENT PLACEMENT  09/21/09   By Dr. Amil Amen. ACS, NSTEMI - BM stent RCA  . OOPHORECTOMY     With hysterectomy. One ovary removed-fibroids.  Marland Kitchen PACEMAKER GENERATOR  CHANGE  08/04/09   By Dr. Amil Amen. New pacing generator is a Medtronic EnRhythm, model [id number], serial [id number]  . PACEMAKER INSERTION  2004   Gen change 08/04/09 by Dr. Amil Amen. New pacing generator is a Medtronic EnRhythm, model [id number], serial [id number]  . PERCUTANEOUS CORONARY STENT INTERVENTION (PCI-S)  05/08/02   (3 tandem Cypher stent), proximal and mid LAD  . VESICOVAGINAL FISTULA CLOSURE W/ TAH      Allergies  Allergen Reactions  . Fosamax [Alendronate Sodium]   . Other Other (See Comments)    Allergic to crab meat, but no shellfish allergy (can eat lobster, shrimp, etc.)  Crab meat causes angioedema of the throat.  Marland Kitchen Penicillins   . Sulfa Antibiotics     Outpatient Encounter Medications as of 06/24/2017  Medication Sig  . acetaminophen (TYLENOL) 325 MG tablet Take 650 mg by mouth 3 (three) times daily.  . traMADol (ULTRAM) 50 MG tablet Take 50 mg by mouth 3 (three) times daily.  Marland Kitchen aspirin EC 81 MG tablet Take 81 mg by mouth daily.  Marland Kitchen CARTIA XT 120 MG 24 hr capsule Take 120 mg by mouth.   . divalproex (DEPAKOTE SPRINKLE) 125 MG capsule 2 (two) times daily.   Marland Kitchen donepezil (ARICEPT) 5 MG tablet Take 5 mg by mouth at bedtime.  Marland Kitchen ezetimibe (ZETIA) 10 MG tablet Take 10 mg by mouth at bedtime.  Marland Kitchen levothyroxine (SYNTHROID, LEVOTHROID) 75 MCG tablet Take 37.5 mcg by mouth daily before breakfast.  . methocarbamol (ROBAXIN) 500 MG tablet Take 250 mg by mouth every 8 (eight) hours as needed for muscle spasms.  . MULTAQ 400 MG tablet TAKE 1 TABLET BY MOUTH TWICE DAILY  . polyethylene glycol (MIRALAX / GLYCOLAX) packet Take 17 g by mouth daily.  . ranitidine (ZANTAC) 150 MG capsule Take 150 mg by mouth daily as needed for heartburn.  . rosuvastatin (CRESTOR) 5 MG tablet Take 5 mg by mouth at bedtime.   . sodium chloride (OCEAN) 0.65 % SOLN nasal spray Place 2 sprays into both nostrils 2 (two) times daily as needed for congestion.  . [DISCONTINUED] traMADol (ULTRAM) 50 MG tablet Take 25  mg by mouth every 6 (six) hours as needed (back pain). Take 1 tablet by mouth every 6 hours as needed pain scaled 6-10   No facility-administered encounter medications on file as of 06/24/2017.     Review of Systems  Constitutional: Positive for activity change. Negative for appetite change, chills, diaphoresis, fatigue, fever and unexpected weight change.  HENT: Negative for congestion.   Respiratory: Negative for cough and shortness of breath.   Cardiovascular: Negative for chest pain and leg swelling.  Gastrointestinal: Negative for abdominal distention, abdominal pain, anal bleeding, blood in stool, constipation, diarrhea, nausea, rectal pain and vomiting.  Genitourinary: Negative for difficulty urinating, dysuria and flank pain.  Musculoskeletal: Positive for arthralgias, back pain and gait problem.  Psychiatric/Behavioral: Positive for behavioral problems and confusion.    Immunization History  Administered Date(s) Administered  . Influenza Inj Mdck Quad Pf 11/06/2015  . Influenza-Unspecified 11/09/2012, 10/31/2014, 11/08/2016  . Pneumococcal Polysaccharide-23 03/23/2005, 04/07/2016  .  Td 03/18/2004  . Zoster 08/07/2007  . Zoster Recombinat (Shingrix) 02/21/2017   Pertinent  Health Maintenance Due  Topic Date Due  . FOOT EXAM  04/07/2017  . PNA vac Low Risk Adult (2 of 2 - PCV13) 04/07/2017  . HEMOGLOBIN A1C  07/04/2017  . INFLUENZA VACCINE  08/18/2017   Fall Risk  01/05/2017 08/25/2016 06/30/2016 01/07/2016 04/30/2015  Falls in the past year? No No No Yes Yes  Number falls in past yr: - - - 1 1  Injury with Fall? - - - No No  Risk for fall due to : - - - - -   Functional Status Survey:    Vitals:   06/24/17 1025  BP: 140/90  Pulse: 75  Resp: 20  Temp: (!) 97 F (36.1 C)   There is no height or weight on file to calculate BMI. Physical Exam  Constitutional: No distress.  HENT:  Head: Normocephalic and atraumatic.  Cardiovascular: Normal rate and regular rhythm.    No murmur heard. Pulmonary/Chest: Breath sounds normal. No respiratory distress.  Abdominal: Soft. Bowel sounds are normal. She exhibits no distension. There is no tenderness. There is no guarding.  Musculoskeletal: Normal range of motion. She exhibits no edema, tenderness or deformity.  Neurological: She is alert.  Oriented x 2 and able to f/c  Skin: Skin is warm and dry. She is not diaphoretic.  Nursing note and vitals reviewed.   Labs reviewed: Recent Labs    08/02/16 0600 01/03/17  NA 143 138  K 5.3 5.4*  BUN 24* 26*  CREATININE 1.3* 1.4*   No results for input(s): AST, ALT, ALKPHOS, BILITOT, PROT, ALBUMIN in the last 8760 hours. No results for input(s): WBC, NEUTROABS, HGB, HCT, MCV, PLT in the last 8760 hours. Lab Results  Component Value Date   TSH 3.08 02/16/2017   Lab Results  Component Value Date   HGBA1C 8.3 01/03/2017   Lab Results  Component Value Date   CHOL 135 04/05/2016   HDL 48 04/05/2016   LDLCALC 60 04/05/2016   TRIG 137 04/05/2016   CHOLHDL 2.9 05/09/2014    Significant Diagnostic Results in last 30 days:  Ct Lumbar Spine Wo Contrast  Result Date: 06/23/2017 CLINICAL DATA:  Fall 06/14/2017.  Back pain. EXAM: CT LUMBAR SPINE WITHOUT CONTRAST TECHNIQUE: Multidetector CT imaging of the lumbar spine was performed without intravenous contrast administration. Multiplanar CT image reconstructions were also generated. COMPARISON:  CT abdomen and pelvis 03/17/2004. Whole-body bone scan 10/28/2010. FINDINGS: Segmentation: 5 non rib-bearing lumbar type vertebral bodies are present. The lowest fully formed vertebral body is L5. Alignment: The moderate scoliosis is convex to the right at L3. A grade 1 anterolisthesis is present at L4-5 and L5-S1. There is slight retrolisthesis at L2-3 and L3-4. Vertebrae: Chronic sclerotic changes are present on the left at L2-3, L3-4, and L4-5 associated with scoliosis. Right-sided sclerotic changes are present at L5-S1. Vertebral  body heights are maintained. No acute fracture or traumatic subluxation is present. Paraspinal and other soft tissues: Atherosclerotic calcifications are present in the aorta and branch vessels without aneurysm. Dense calcifications suggest high-grade stenosis or occlusion of the right proximal common iliac artery. Nonobstructing stones are present in the kidneys bilaterally. Ureters are within normal limits. Gallbladder is dilated, within normal limits. Disc levels: L1-2: Mild facet hypertrophy is present. There is no significant stenosis. L2-3: A broad-based disc protrusion is present. Facet hypertrophy is worse on the left. This results in moderate to severe central canal stenosis with  left greater than right subarticular narrowing. Severe left and mild right foraminal stenosis is present. L3-4: Moderate central canal stenosis is worse on the left secondary to leftward disc protrusion. Moderate facet hypertrophy and spurring is worse on the left. Moderate left and mild right subarticular stenosis is present. There is moderate foraminal stenosis bilaterally. L4-5: Severe central canal stenosis is secondary to uncovering of a broad-based disc protrusion and advanced bilateral facet hypertrophy. Moderate foraminal stenosis is worse on the left. L5-S1: Moderate subarticular narrowing is secondary to uncovering of a broad-based disc protrusion. This is worse on the right. Moderate foraminal stenosis is present bilaterally. IMPRESSION: 1. Moderate rightward scoliosis is centered at L3-4. 2. Severe central canal stenosis at L2-3 with left greater than right subarticular narrowing. 3. Severe left and mild right foraminal narrowing at L2-3. 4. Moderate left and mild right subarticular narrowing at L3-4 with moderate foraminal narrowing bilaterally. 5. Severe central canal narrowing at L4-5. 6. Moderate foraminal stenosis at L4-5 is worse on the left. 7. Moderate right subarticular and bilateral foraminal stenosis at  L5-S1. Electronically Signed   By: Marin Roberts M.D.   On: 06/23/2017 17:02   Ct Pelvis Wo Contrast  Result Date: 06/24/2017 CLINICAL DATA:  Low back and pelvic pain after fall last week. EXAM: CT PELVIS WITHOUT CONTRAST TECHNIQUE: Multidetector CT imaging of the pelvis was performed following the standard protocol without intravenous contrast. COMPARISON:  CT scan of February 26, 2004. FINDINGS: Urinary Tract:  No abnormality visualized. Bowel:  Unremarkable visualized pelvic bowel loops. Vascular/Lymphatic: Atherosclerosis of visualized abdominal aorta and iliac arteries is noted. No adenopathy is noted. Reproductive: Status post hysterectomy. No adnexal abnormality is noted. Other: No abnormal fluid collection is noted. No significant hernia is noted. Musculoskeletal: Multilevel degenerative disc disease is noted in the visualized lower lumbar spine. Hips are unremarkable. No acute osseous abnormality is noted. IMPRESSION: Multilevel degenerative disc disease in the lower lumbar spine. No acute osseous abnormality is noted. No acute abnormality seen in the pelvis. Aortic Atherosclerosis (ICD10-I70.0). Electronically Signed   By: Lupita Raider, M.D.   On: 06/24/2017 08:31    Assessment/Plan  1. Nausea and vomiting in adult Resolving ?if this is due to med effect without food vs gastroenteritis She is having bowel movements and no bowel issues were noted on CT Zofran 4 mg q 8 hrs prn CBC BMP Take ultram with food Continue to monitor VS and symptoms  2. Sacral back pain Improved with scheduled tylenol and ultram Possibly due to spinal stenosis noted on CT scan Continue PT and OT Not a surgical candidate   Family/ staff Communication: staff/resident  Labs/tests ordered:  CBC BMP Dep level A1C

## 2017-06-27 ENCOUNTER — Encounter: Payer: Self-pay | Admitting: *Deleted

## 2017-06-27 DIAGNOSIS — M62562 Muscle wasting and atrophy, not elsewhere classified, left lower leg: Secondary | ICD-10-CM | POA: Diagnosis not present

## 2017-06-27 DIAGNOSIS — F0151 Vascular dementia with behavioral disturbance: Secondary | ICD-10-CM | POA: Diagnosis not present

## 2017-06-27 DIAGNOSIS — R2689 Other abnormalities of gait and mobility: Secondary | ICD-10-CM | POA: Diagnosis not present

## 2017-06-27 DIAGNOSIS — M6389 Disorders of muscle in diseases classified elsewhere, multiple sites: Secondary | ICD-10-CM | POA: Diagnosis not present

## 2017-06-27 DIAGNOSIS — R278 Other lack of coordination: Secondary | ICD-10-CM | POA: Diagnosis not present

## 2017-06-27 DIAGNOSIS — M62561 Muscle wasting and atrophy, not elsewhere classified, right lower leg: Secondary | ICD-10-CM | POA: Diagnosis not present

## 2017-06-28 DIAGNOSIS — M62562 Muscle wasting and atrophy, not elsewhere classified, left lower leg: Secondary | ICD-10-CM | POA: Diagnosis not present

## 2017-06-28 DIAGNOSIS — R278 Other lack of coordination: Secondary | ICD-10-CM | POA: Diagnosis not present

## 2017-06-28 DIAGNOSIS — M6389 Disorders of muscle in diseases classified elsewhere, multiple sites: Secondary | ICD-10-CM | POA: Diagnosis not present

## 2017-06-28 DIAGNOSIS — R2689 Other abnormalities of gait and mobility: Secondary | ICD-10-CM | POA: Diagnosis not present

## 2017-06-28 DIAGNOSIS — F0151 Vascular dementia with behavioral disturbance: Secondary | ICD-10-CM | POA: Diagnosis not present

## 2017-06-28 DIAGNOSIS — M62561 Muscle wasting and atrophy, not elsewhere classified, right lower leg: Secondary | ICD-10-CM | POA: Diagnosis not present

## 2017-06-29 DIAGNOSIS — R2689 Other abnormalities of gait and mobility: Secondary | ICD-10-CM | POA: Diagnosis not present

## 2017-06-29 DIAGNOSIS — R278 Other lack of coordination: Secondary | ICD-10-CM | POA: Diagnosis not present

## 2017-06-29 DIAGNOSIS — F0151 Vascular dementia with behavioral disturbance: Secondary | ICD-10-CM | POA: Diagnosis not present

## 2017-06-29 DIAGNOSIS — M62561 Muscle wasting and atrophy, not elsewhere classified, right lower leg: Secondary | ICD-10-CM | POA: Diagnosis not present

## 2017-06-29 DIAGNOSIS — M62562 Muscle wasting and atrophy, not elsewhere classified, left lower leg: Secondary | ICD-10-CM | POA: Diagnosis not present

## 2017-06-29 DIAGNOSIS — M6389 Disorders of muscle in diseases classified elsewhere, multiple sites: Secondary | ICD-10-CM | POA: Diagnosis not present

## 2017-06-30 DIAGNOSIS — M62561 Muscle wasting and atrophy, not elsewhere classified, right lower leg: Secondary | ICD-10-CM | POA: Diagnosis not present

## 2017-06-30 DIAGNOSIS — R278 Other lack of coordination: Secondary | ICD-10-CM | POA: Diagnosis not present

## 2017-06-30 DIAGNOSIS — M6389 Disorders of muscle in diseases classified elsewhere, multiple sites: Secondary | ICD-10-CM | POA: Diagnosis not present

## 2017-06-30 DIAGNOSIS — R2689 Other abnormalities of gait and mobility: Secondary | ICD-10-CM | POA: Diagnosis not present

## 2017-06-30 DIAGNOSIS — M62562 Muscle wasting and atrophy, not elsewhere classified, left lower leg: Secondary | ICD-10-CM | POA: Diagnosis not present

## 2017-06-30 DIAGNOSIS — F0151 Vascular dementia with behavioral disturbance: Secondary | ICD-10-CM | POA: Diagnosis not present

## 2017-07-01 DIAGNOSIS — M62562 Muscle wasting and atrophy, not elsewhere classified, left lower leg: Secondary | ICD-10-CM | POA: Diagnosis not present

## 2017-07-01 DIAGNOSIS — R2689 Other abnormalities of gait and mobility: Secondary | ICD-10-CM | POA: Diagnosis not present

## 2017-07-01 DIAGNOSIS — M62561 Muscle wasting and atrophy, not elsewhere classified, right lower leg: Secondary | ICD-10-CM | POA: Diagnosis not present

## 2017-07-01 DIAGNOSIS — R278 Other lack of coordination: Secondary | ICD-10-CM | POA: Diagnosis not present

## 2017-07-01 DIAGNOSIS — F0151 Vascular dementia with behavioral disturbance: Secondary | ICD-10-CM | POA: Diagnosis not present

## 2017-07-01 DIAGNOSIS — M6389 Disorders of muscle in diseases classified elsewhere, multiple sites: Secondary | ICD-10-CM | POA: Diagnosis not present

## 2017-07-04 DIAGNOSIS — M62562 Muscle wasting and atrophy, not elsewhere classified, left lower leg: Secondary | ICD-10-CM | POA: Diagnosis not present

## 2017-07-04 DIAGNOSIS — R278 Other lack of coordination: Secondary | ICD-10-CM | POA: Diagnosis not present

## 2017-07-04 DIAGNOSIS — M62561 Muscle wasting and atrophy, not elsewhere classified, right lower leg: Secondary | ICD-10-CM | POA: Diagnosis not present

## 2017-07-04 DIAGNOSIS — F0151 Vascular dementia with behavioral disturbance: Secondary | ICD-10-CM | POA: Diagnosis not present

## 2017-07-04 DIAGNOSIS — M6389 Disorders of muscle in diseases classified elsewhere, multiple sites: Secondary | ICD-10-CM | POA: Diagnosis not present

## 2017-07-04 DIAGNOSIS — R2689 Other abnormalities of gait and mobility: Secondary | ICD-10-CM | POA: Diagnosis not present

## 2017-07-05 DIAGNOSIS — M6389 Disorders of muscle in diseases classified elsewhere, multiple sites: Secondary | ICD-10-CM | POA: Diagnosis not present

## 2017-07-05 DIAGNOSIS — M62561 Muscle wasting and atrophy, not elsewhere classified, right lower leg: Secondary | ICD-10-CM | POA: Diagnosis not present

## 2017-07-05 DIAGNOSIS — M62562 Muscle wasting and atrophy, not elsewhere classified, left lower leg: Secondary | ICD-10-CM | POA: Diagnosis not present

## 2017-07-05 DIAGNOSIS — R278 Other lack of coordination: Secondary | ICD-10-CM | POA: Diagnosis not present

## 2017-07-05 DIAGNOSIS — F0151 Vascular dementia with behavioral disturbance: Secondary | ICD-10-CM | POA: Diagnosis not present

## 2017-07-05 DIAGNOSIS — R2689 Other abnormalities of gait and mobility: Secondary | ICD-10-CM | POA: Diagnosis not present

## 2017-07-06 ENCOUNTER — Non-Acute Institutional Stay (SKILLED_NURSING_FACILITY): Payer: Medicare Other

## 2017-07-06 DIAGNOSIS — Z0000 Encounter for general adult medical examination without abnormal findings: Secondary | ICD-10-CM

## 2017-07-06 DIAGNOSIS — M62562 Muscle wasting and atrophy, not elsewhere classified, left lower leg: Secondary | ICD-10-CM | POA: Diagnosis not present

## 2017-07-06 DIAGNOSIS — R278 Other lack of coordination: Secondary | ICD-10-CM | POA: Diagnosis not present

## 2017-07-06 DIAGNOSIS — F0151 Vascular dementia with behavioral disturbance: Secondary | ICD-10-CM | POA: Diagnosis not present

## 2017-07-06 DIAGNOSIS — M62561 Muscle wasting and atrophy, not elsewhere classified, right lower leg: Secondary | ICD-10-CM | POA: Diagnosis not present

## 2017-07-06 DIAGNOSIS — M6389 Disorders of muscle in diseases classified elsewhere, multiple sites: Secondary | ICD-10-CM | POA: Diagnosis not present

## 2017-07-06 DIAGNOSIS — R2689 Other abnormalities of gait and mobility: Secondary | ICD-10-CM | POA: Diagnosis not present

## 2017-07-06 NOTE — Patient Instructions (Addendum)
[name] , Thank you for taking time to come for your Medicare Wellness Visit. I appreciate your ongoing commitment to your health goals. Please review the following plan we discussed and let me know if I can assist you in the future.   Screening recommendations/referrals: Colonoscopy excluded, over age 82 Mammogram excluded over age 82 Bone Density up to date Recommended yearly ophthalmology/optometry visit for glaucoma screening and checkup Recommended yearly dental visit for hygiene and checkup  Vaccinations: Influenza vaccine up to date, due 2019 fall season Pneumococcal vaccine 13 due, ordered Tdap vaccine up to date, due 07/02/2026 Shingles vaccine up to date, waiting for second shot    Advanced directives: in chart  Conditions/risks identified: none  Next appointment: Dr. Renato Gailseed 07/13/2017 @ 11am   Preventive Care 65 Years and Older, Female Preventive care refers to lifestyle choices and visits with your health care provider that can promote health and wellness. What does preventive care include?  A yearly physical exam. This is also called an annual well check.  Dental exams once or twice a year.  Routine eye exams. Ask your health care provider how often you should have your eyes checked.  Personal lifestyle choices, including:  Daily care of your teeth and gums.  Regular physical activity.  Eating a healthy diet.  Avoiding tobacco and drug use.  Limiting alcohol use.  Practicing safe sex.  Taking low-dose aspirin every day.  Taking vitamin and mineral supplements as recommended by your health care provider. What happens during an annual well check? The services and screenings done by your health care provider during your annual well check will depend on your age, overall health, lifestyle risk factors, and family history of disease. Counseling  Your health care provider may ask you questions about your:  Alcohol use.  Tobacco use.  Drug  use.  Emotional well-being.  Home and relationship well-being.  Sexual activity.  Eating habits.  History of falls.  Memory and ability to understand (cognition).  Work and work Astronomerenvironment.  Reproductive health. Screening  You may have the following tests or measurements:  Height, weight, and BMI.  Blood pressure.  Lipid and cholesterol levels. These may be checked every 5 years, or more frequently if you are over 501 years old.  Skin check.  Lung cancer screening. You may have this screening every year starting at age 82 if you have a 30-pack-year history of smoking and currently smoke or have quit within the past 15 years.  Fecal occult blood test (FOBT) of the stool. You may have this test every year starting at age 82.  Flexible sigmoidoscopy or colonoscopy. You may have a sigmoidoscopy every 5 years or a colonoscopy every 10 years starting at age 82.  Hepatitis C blood test.  Hepatitis B blood test.  Sexually transmitted disease (STD) testing.  Diabetes screening. This is done by checking your blood sugar (glucose) after you have not eaten for a while (fasting). You may have this done every 1-3 years.  Bone density scan. This is done to screen for osteoporosis. You may have this done starting at age 82.  Mammogram. This may be done every 1-2 years. Talk to your health care provider about how often you should have regular mammograms. Talk with your health care provider about your test results, treatment options, and if necessary, the need for more tests. Vaccines  Your health care provider may recommend certain vaccines, such as:  Influenza vaccine. This is recommended every year.  Tetanus, diphtheria, and  acellular pertussis (Tdap, Td) vaccine. You may need a Td booster every 10 years.  Zoster vaccine. You may need this after age 58.  Pneumococcal 13-valent conjugate (PCV13) vaccine. One dose is recommended after age 59.  Pneumococcal polysaccharide  (PPSV23) vaccine. One dose is recommended after age 13. Talk to your health care provider about which screenings and vaccines you need and how often you need them. This information is not intended to replace advice given to you by your health care provider. Make sure you discuss any questions you have with your health care provider. Document Released: 01/31/2015 Document Revised: 09/24/2015 Document Reviewed: 11/05/2014 Elsevier Interactive Patient Education  2017 [place] Prevention in the Home Falls can cause injuries. They can happen to people of all ages. There are many things you can do to make your home safe and to help prevent falls. What can I do on the outside of my home?  Regularly fix the edges of walkways and driveways and fix any cracks.  Remove anything that might make you trip as you walk through a door, such as a raised step or threshold.  Trim any bushes or trees on the path to your home.  Use bright outdoor lighting.  Clear any walking paths of anything that might make someone trip, such as rocks or tools.  Regularly check to see if handrails are loose or broken. Make sure that both sides of any steps have handrails.  Any raised decks and porches should have guardrails on the edges.  Have any leaves, snow, or ice cleared regularly.  Use sand or salt on walking paths during winter.  Clean up any spills in your garage right away. This includes oil or grease spills. What can I do in the bathroom?  Use night lights.  Install grab bars by the toilet and in the tub and shower. Do not use towel bars as grab bars.  Use non-skid mats or decals in the tub or shower.  If you need to sit down in the shower, use a plastic, non-slip stool.  Keep the floor dry. Clean up any water that spills on the floor as soon as it happens.  Remove soap buildup in the tub or shower regularly.  Attach bath mats securely with double-sided non-slip rug tape.  Do not have  throw rugs and other things on the floor that can make you trip. What can I do in the bedroom?  Use night lights.  Make sure that you have a light by your bed that is easy to reach.  Do not use any sheets or blankets that are too big for your bed. They should not hang down onto the floor.  Have a firm chair that has side arms. You can use this for support while you get dressed.  Do not have throw rugs and other things on the floor that can make you trip. What can I do in the kitchen?  Clean up any spills right away.  Avoid walking on wet floors.  Keep items that you use a lot in easy-to-reach places.  If you need to reach something above you, use a strong step stool that has a grab bar.  Keep electrical cords out of the way.  Do not use floor polish or wax that makes floors slippery. If you must use wax, use non-skid floor wax.  Do not have throw rugs and other things on the floor that can make you trip. What can I do with my stairs?  Do not leave any items on the stairs.  Make sure that there are handrails on both sides of the stairs and use them. Fix handrails that are broken or loose. Make sure that handrails are as long as the stairways.  Check any carpeting to make sure that it is firmly attached to the stairs. Fix any carpet that is loose or worn.  Avoid having throw rugs at the top or bottom of the stairs. If you do have throw rugs, attach them to the floor with carpet tape.  Make sure that you have a light switch at the top of the stairs and the bottom of the stairs. If you do not have them, ask someone to add them for you. What else can I do to help prevent falls?  Wear shoes that:  Do not have high heels.  Have rubber bottoms.  Are comfortable and fit you well.  Are closed at the toe. Do not wear sandals.  If you use a stepladder:  Make sure that it is fully opened. Do not climb a closed stepladder.  Make sure that both sides of the stepladder are  locked into place.  Ask someone to hold it for you, if possible.  Clearly mark and make sure that you can see:  Any grab bars or handrails.  First and last steps.  Where the edge of each step is.  Use tools that help you move around (mobility aids) if they are needed. These include:  Canes.  Walkers.  Scooters.  Crutches.  Turn on the lights when you go into a dark area. Replace any light bulbs as soon as they burn out.  Set up your furniture so you have a clear path. Avoid moving your furniture around.  If any of your floors are uneven, fix them.  If there are any pets around you, be aware of where they are.  Review your medicines with your doctor. Some medicines can make you feel dizzy. This can increase your chance of falling. Ask your doctor what other things that you can do to help prevent falls. This information is not intended to replace advice given to you by your health care provider. Make sure you discuss any questions you have with your health care provider. Document Released: 10/31/2008 Document Revised: 06/12/2015 Document Reviewed: 02/08/2014 Elsevier Interactive Patient Education  2017 [employer].

## 2017-07-06 NOTE — Progress Notes (Signed)
Subjective:   [name] is a 84 y.o. female who presents for Medicare Annual (Subsequent) preventive examination at [facility]; incapacitated patient unable to answer questions appropriately     Objective:     Vitals: BP 122/64 (BP Location: Left Arm, Patient Position: Sitting)   Pulse 66   Temp 97.9 F (36.6 C) (Oral)   Ht 4\' 8"  (1.422 m)   Wt 129 lb (58.5 kg)   SpO2 96%   BMI 28.92 kg/m   Body mass index is 28.92 kg/m.  Advanced Directives 07/06/2017 06/21/2017 06/14/2017 01/05/2017 08/25/2016 06/30/2016 04/07/2016  Does Patient Have a Medical Advance Directive? Yes Yes Yes Yes Yes Yes Yes  Type of Advance Directive Out of facility DNR (pink MOST or yellow form);Living will;Healthcare Power of Attorney Out of facility DNR (pink MOST or yellow form);Living will;Healthcare Power of [place] Healthcare Power of [place];Out of facility DNR (pink MOST or yellow form);Living will Out of facility DNR (pink MOST or yellow form);Healthcare Power of [place];Living will Out of facility DNR (pink MOST or yellow form);Living will;Healthcare Power of Attorney Out of facility DNR (pink MOST or yellow form);Healthcare Power of [place];Living will Out of facility DNR (pink MOST or yellow form);Healthcare Power of Attorney  Does patient want to make changes to medical advance directive? No - Patient declined No - Patient declined No - Patient declined No - Patient declined - No - Patient declined -  Copy of Healthcare Power of Attorney in Chart? Yes Yes Yes Yes Yes Yes Yes  Would patient like information on creating a medical advance directive? - - - - - - -  Pre-existing out of facility DNR order (yellow form or pink MOST form) Yellow form placed in chart (order not valid for inpatient use) Yellow form placed in chart (order not valid for inpatient use) Yellow form placed in chart (order not valid for inpatient use) Yellow form placed in chart (order not valid for inpatient use) Yellow form  placed in chart (order not valid for inpatient use) Yellow form placed in chart (order not valid for inpatient use) Yellow form placed in chart (order not valid for inpatient use)    Tobacco Social History   Tobacco Use  Smoking Status Never Smoker  Smokeless Tobacco Never Used     Counseling given: Not Answered   Clinical Intake:  Pre-visit preparation completed: No  Pain : No/denies pain     Nutritional Risks: None Diabetes: No  How often do you need to have someone help you when you read instructions, pamphlets, or other written materials from your doctor or pharmacy?: 4 - Often(dementia)  Interpreter Needed?: No  Information entered by :: Tyron RussellSara Saunders, RN  Past Medical History:  Diagnosis Date  . Aortic sclerosis   . Atrial fibrillation (HCC)   . Benign hypertensive heart disease without heart failure   . Benign hypertensive kidney disease with chronic kidney disease stage I through stage IV, or unspecified(403.10)   . Carpal tunnel syndrome   . Chronic anemia   . Chronic anemia   . Chronic renal disease, stage III (HCC)   . Coronary atherosclerosis of native coronary artery   . Diabetes mellitus with chronic kidney disease (HCC)   . Essential hypertension, benign   . H/O echocardiogram 07/19/09   EF = 60-65%  . Hyperglycemia    Mild  . Hyperlipidemia    Tolerating medication well as of 11/27/12  . Hypothyroidism   . Microalbuminuria 02/2010  . NSTEMI (non-ST elevated myocardial  infarction) (HCC) 09/21/09   BM stent RCA  . Osteoarthritis   . Osteoporosis, senile   . Postsurgical percutaneous transluminal coronary angioplasty status   . Pure hypercholesterolemia   . Sinoatrial node dysfunction (HCC)   . Spinal stenosis   . Spinal stenosis of lumbar region   . Tachy-brady syndrome (HCC)    s/p PPM, battery change 08/01/09  . Vitamin D deficiency 10/2011   Past Surgical History:  Procedure Laterality Date  . CARPAL TUNNEL RELEASE Right 02/07/09  .  CORONARY ANGIOPLASTY WITH STENT PLACEMENT  09/21/09   By Dr. Amil Amen. ACS, NSTEMI - BM stent RCA  . OOPHORECTOMY     With hysterectomy. One ovary removed-fibroids.  Marland Kitchen PACEMAKER GENERATOR CHANGE  08/04/09   By Dr. Amil Amen. New pacing generator is a Medtronic EnRhythm, model D1939726, serial [id number]  . PACEMAKER INSERTION  2004   Gen change 08/04/09 by Dr. Amil Amen. New pacing generator is a Medtronic EnRhythm, model D1939726, serial [id number]  . PERCUTANEOUS CORONARY STENT INTERVENTION (PCI-S)  05/08/02   (3 tandem Cypher stent), proximal and mid LAD  . VESICOVAGINAL FISTULA CLOSURE W/ TAH     Family History  Problem Relation Age of Onset  . Heart disease Mother   . Heart disease Father   . Diabetes Father   . Peripheral vascular disease Brother   . Cerebral palsy Son    Social History   Socioeconomic History  . Marital status: Widowed    Spouse name: Not on file  . Number of children: 3  . Years of education: Not on file  . Highest education level: Not on file  Occupational History    Employer: RETIRED  Social Needs  . Financial resource strain: Not on file  . Food insecurity:    Worry: Not on file    Inability: Not on file  . Transportation needs:    Medical: Not on file    Non-medical: Not on file  Tobacco Use  . Smoking status: Never Smoker  . Smokeless tobacco: Never Used  Substance and Sexual Activity  . Alcohol use: Yes    Comment: occasional glass of wine  . Drug use: No  . Sexual activity: Never  Lifestyle  . Physical activity:    Days per week: Not on file    Minutes per session: Not on file  . Stress: Not on file  Relationships  . Social connections:    Talks on phone: Not on file    Gets together: Not on file    Attends religious service: Not on file    Active member of club or organization: Not on file    Attends meetings of clubs or organizations: Not on file    Relationship status: Not on file  Other Topics Concern  . Not on file  Social  History Narrative   Lives at [facility] 10/07/2014   Widow   Never smoked   Alcohol none   Exercise none   DNR, POA, Living Will   Walks with walker    Outpatient Encounter Medications as of 07/06/2017  Medication Sig  . acetaminophen (TYLENOL) 325 MG tablet Take 650 mg by mouth 3 (three) times daily.  Marland Kitchen aspirin EC 81 MG tablet Take 81 mg by mouth daily.  Marland Kitchen CARTIA XT 120 MG 24 hr capsule Take 120 mg by mouth.   . divalproex (DEPAKOTE SPRINKLE) 125 MG capsule 2 (two) times daily.   Marland Kitchen donepezil (ARICEPT) 5 MG tablet Take 5 mg by mouth at bedtime.  Marland Kitchen  ezetimibe (ZETIA) 10 MG tablet Take 10 mg by mouth at bedtime.  Marland Kitchen levothyroxine (SYNTHROID, LEVOTHROID) 75 MCG tablet Take 37.5 mcg by mouth daily before breakfast.  . methocarbamol (ROBAXIN) 500 MG tablet Take 250 mg by mouth every 8 (eight) hours as needed for muscle spasms.  . MULTAQ 400 MG tablet TAKE 1 TABLET BY MOUTH TWICE DAILY  . polyethylene glycol (MIRALAX / GLYCOLAX) packet Take 17 g by mouth daily.  . ranitidine (ZANTAC) 150 MG capsule Take 150 mg by mouth daily as needed for heartburn.  . rosuvastatin (CRESTOR) 5 MG tablet Take 5 mg by mouth at bedtime.   . sodium chloride (OCEAN) 0.65 % SOLN nasal spray Place 2 sprays into both nostrils 2 (two) times daily as needed for congestion.  . traMADol (ULTRAM) 50 MG tablet Take 50 mg by mouth 3 (three) times daily.   No facility-administered encounter medications on file as of 07/06/2017.     Activities of Daily Living In your present state of health, do you have any difficulty performing the following activities: 07/06/2017  Hearing? Y  Vision? N  Difficulty concentrating or making decisions? Y  Walking or climbing stairs? Y  Dressing or bathing? Y  Doing errands, shopping? Y  Preparing Food and eating ? Y  Using the Toilet? Y  In the past six months, have you accidently leaked urine? Y  Do you have problems with loss of bowel control? Y  Managing your Medications? Y    Managing your Finances? Y  Housekeeping or managing your Housekeeping? Y  Some recent data might be hidden    Patient Care Team: Kermit Balo, DO as PCP - General (Geriatric Medicine) Early, Kristen Loader, MD as Consulting Physician (Vascular Surgery) Ranelle Oyster, MD as Consulting Physician (Physical Medicine and Rehabilitation) Micki Riley, MD as Consulting Physician (Neurology) Hillis Range, MD as Consulting Physician (Cardiology) Mckinley Jewel, MD as Consulting Physician (Ophthalmology) Salvatore Marvel, MD as Consulting Physician (Orthopedic Surgery) Marcine Matar, MD as Consulting Physician (Urology)    Assessment:   This is a routine wellness examination for [place].  Exercise Activities and Dietary recommendations Current Exercise Habits: The patient does not participate in regular exercise at present, Exercise limited by: neurologic condition(s)  Goals    None      Fall Risk Fall Risk  07/06/2017 01/05/2017 08/25/2016 06/30/2016 01/07/2016  Falls in the past year? No No No No Yes  Number falls in past yr: - - - - 1  Injury with Fall? - - - - No  Risk for fall due to : - - - - -   Is the patient's home free of loose throw rugs in walkways, pet beds, electrical cords, etc?   yes      Grab bars in the bathroom? yes      Handrails on the stairs?   yes      Adequate lighting?   yes  Depression Screen PHQ 2/9 Scores 07/06/2017 01/05/2017 08/25/2016 06/30/2016  PHQ - 2 Score - 0 0 0  Exception Documentation Medical reason - - -     Cognitive Function completed within the last year MMSE - Mini Mental State Exam 04/01/2017 04/28/2016 10/30/2015  Orientation to time 2 4 3   Orientation to Place 3 0 4  Registration 3 0 3  Attention/ Calculation 2 0 1  Recall 0 0 0  Language- name 2 objects 2 2 2   Language- repeat 1 1 1   Language- follow 3 step command 3  3 3  Language- read & follow direction 1 0 1  Write a sentence 1 1 1   Copy design 1 1 1   Total score 19 12  20         Immunization History  Administered Date(s) Administered  . Influenza Inj Mdck Quad Pf 11/06/2015  . Influenza-Unspecified 11/09/2012, 10/31/2014, 11/08/2016  . Pneumococcal Polysaccharide-23 03/23/2005, 04/07/2016  . Td 03/18/2004  . Tdap 07/01/2016  . Zoster 08/07/2007  . Zoster Recombinat (Shingrix) 02/21/2017    Qualifies for Shingles Vaccine? Yes, waiting for second Shingrix shot  Screening Tests Health Maintenance  Topic Date Due  . FOOT EXAM  04/07/2017  . PNA vac Low Risk Adult (2 of 2 - PCV13) 04/07/2017  . HEMOGLOBIN A1C  07/04/2017  . INFLUENZA VACCINE  08/18/2017  . TETANUS/TDAP  07/02/2026    Cancer Screenings: Lung: Low Dose CT Chest recommended if Age 1-80 years, 30 pack-year currently smoking OR have quit w/in 15years. Patient does not qualify. Breast:  Up to date on Mammogram? Yes   Up to date of Bone Density/Dexa? Yes Colorectal: up to date  Additional Screenings:  Hepatitis C Screening: declined Prevnar due-ordered    Plan:    I have personally reviewed and addressed the Medicare Annual Wellness questionnaire and have noted the following in the patient's chart:  A. Medical and social history B. Use of alcohol, tobacco or illicit drugs  C. Current medications and supplements D. Functional ability and status E.  Nutritional status F.  Physical activity G. Advance directives H. List of other physicians I.  Hospitalizations, surgeries, and ER visits in previous 12 months J.  Vitals K. Screenings to include hearing, vision, cognitive, depression L. Referrals and appointments - none  In addition, I am unable to review and discuss with incapacitated patient certain preventive protocols, quality metrics, and best practice recommendations. A written personalized care plan for preventive services as well as general preventive health recommendations were provided to patient.  See attached scanned questionnaire for additional information.    Signed,   Tyron Russell, RN Nurse Health Advisor  Patient Concerns: None

## 2017-07-07 DIAGNOSIS — M62562 Muscle wasting and atrophy, not elsewhere classified, left lower leg: Secondary | ICD-10-CM | POA: Diagnosis not present

## 2017-07-07 DIAGNOSIS — F0151 Vascular dementia with behavioral disturbance: Secondary | ICD-10-CM | POA: Diagnosis not present

## 2017-07-07 DIAGNOSIS — R278 Other lack of coordination: Secondary | ICD-10-CM | POA: Diagnosis not present

## 2017-07-07 DIAGNOSIS — R2689 Other abnormalities of gait and mobility: Secondary | ICD-10-CM | POA: Diagnosis not present

## 2017-07-07 DIAGNOSIS — M62561 Muscle wasting and atrophy, not elsewhere classified, right lower leg: Secondary | ICD-10-CM | POA: Diagnosis not present

## 2017-07-07 DIAGNOSIS — M6389 Disorders of muscle in diseases classified elsewhere, multiple sites: Secondary | ICD-10-CM | POA: Diagnosis not present

## 2017-07-08 DIAGNOSIS — R2689 Other abnormalities of gait and mobility: Secondary | ICD-10-CM | POA: Diagnosis not present

## 2017-07-08 DIAGNOSIS — M62561 Muscle wasting and atrophy, not elsewhere classified, right lower leg: Secondary | ICD-10-CM | POA: Diagnosis not present

## 2017-07-08 DIAGNOSIS — R278 Other lack of coordination: Secondary | ICD-10-CM | POA: Diagnosis not present

## 2017-07-08 DIAGNOSIS — M6389 Disorders of muscle in diseases classified elsewhere, multiple sites: Secondary | ICD-10-CM | POA: Diagnosis not present

## 2017-07-08 DIAGNOSIS — M62562 Muscle wasting and atrophy, not elsewhere classified, left lower leg: Secondary | ICD-10-CM | POA: Diagnosis not present

## 2017-07-08 DIAGNOSIS — F0151 Vascular dementia with behavioral disturbance: Secondary | ICD-10-CM | POA: Diagnosis not present

## 2017-07-11 DIAGNOSIS — M62561 Muscle wasting and atrophy, not elsewhere classified, right lower leg: Secondary | ICD-10-CM | POA: Diagnosis not present

## 2017-07-11 DIAGNOSIS — M62562 Muscle wasting and atrophy, not elsewhere classified, left lower leg: Secondary | ICD-10-CM | POA: Diagnosis not present

## 2017-07-11 DIAGNOSIS — R2689 Other abnormalities of gait and mobility: Secondary | ICD-10-CM | POA: Diagnosis not present

## 2017-07-11 DIAGNOSIS — F0151 Vascular dementia with behavioral disturbance: Secondary | ICD-10-CM | POA: Diagnosis not present

## 2017-07-11 DIAGNOSIS — R278 Other lack of coordination: Secondary | ICD-10-CM | POA: Diagnosis not present

## 2017-07-11 DIAGNOSIS — M6389 Disorders of muscle in diseases classified elsewhere, multiple sites: Secondary | ICD-10-CM | POA: Diagnosis not present

## 2017-07-12 DIAGNOSIS — E039 Hypothyroidism, unspecified: Secondary | ICD-10-CM | POA: Diagnosis not present

## 2017-07-12 DIAGNOSIS — M62562 Muscle wasting and atrophy, not elsewhere classified, left lower leg: Secondary | ICD-10-CM | POA: Diagnosis not present

## 2017-07-12 DIAGNOSIS — E118 Type 2 diabetes mellitus with unspecified complications: Secondary | ICD-10-CM | POA: Diagnosis not present

## 2017-07-12 DIAGNOSIS — M6389 Disorders of muscle in diseases classified elsewhere, multiple sites: Secondary | ICD-10-CM | POA: Diagnosis not present

## 2017-07-12 DIAGNOSIS — I10 Essential (primary) hypertension: Secondary | ICD-10-CM | POA: Diagnosis not present

## 2017-07-12 DIAGNOSIS — E119 Type 2 diabetes mellitus without complications: Secondary | ICD-10-CM | POA: Diagnosis not present

## 2017-07-12 DIAGNOSIS — R278 Other lack of coordination: Secondary | ICD-10-CM | POA: Diagnosis not present

## 2017-07-12 DIAGNOSIS — M62561 Muscle wasting and atrophy, not elsewhere classified, right lower leg: Secondary | ICD-10-CM | POA: Diagnosis not present

## 2017-07-12 DIAGNOSIS — D649 Anemia, unspecified: Secondary | ICD-10-CM | POA: Diagnosis not present

## 2017-07-12 DIAGNOSIS — R2689 Other abnormalities of gait and mobility: Secondary | ICD-10-CM | POA: Diagnosis not present

## 2017-07-12 DIAGNOSIS — F0151 Vascular dementia with behavioral disturbance: Secondary | ICD-10-CM | POA: Diagnosis not present

## 2017-07-12 DIAGNOSIS — N189 Chronic kidney disease, unspecified: Secondary | ICD-10-CM | POA: Diagnosis not present

## 2017-07-12 LAB — CBC AND DIFFERENTIAL
HCT: 44 (ref 36–46)
Hemoglobin: 13.8 (ref 12.0–16.0)
Platelets: 337 (ref 150–399)
WBC: 14.1

## 2017-07-12 LAB — HEPATIC FUNCTION PANEL
ALT: 48 — AB (ref 7–35)
AST: 120 — AB (ref 13–35)
Alkaline Phosphatase: 298 — AB (ref 25–125)
Bilirubin, Total: 0.4

## 2017-07-12 LAB — BASIC METABOLIC PANEL
BUN: 16 (ref 4–21)
Creatinine: 1.2 — AB (ref 0.5–1.1)
Glucose: 99
Potassium: 4.3 (ref 3.4–5.3)
Sodium: 140 (ref 137–147)

## 2017-07-12 LAB — TSH: TSH: 2.21 (ref 0.41–5.90)

## 2017-07-12 LAB — HEMOGLOBIN A1C: Hemoglobin A1C: 8

## 2017-07-13 ENCOUNTER — Encounter: Payer: Self-pay | Admitting: Internal Medicine

## 2017-07-13 ENCOUNTER — Non-Acute Institutional Stay: Payer: Medicare Other | Admitting: Internal Medicine

## 2017-07-13 VITALS — BP 120/60 | HR 98 | Temp 97.9°F | Ht <= 58 in | Wt 119.0 lb

## 2017-07-13 DIAGNOSIS — M533 Sacrococcygeal disorders, not elsewhere classified: Secondary | ICD-10-CM | POA: Diagnosis not present

## 2017-07-13 DIAGNOSIS — M62561 Muscle wasting and atrophy, not elsewhere classified, right lower leg: Secondary | ICD-10-CM | POA: Diagnosis not present

## 2017-07-13 DIAGNOSIS — F0151 Vascular dementia with behavioral disturbance: Secondary | ICD-10-CM | POA: Diagnosis not present

## 2017-07-13 DIAGNOSIS — H903 Sensorineural hearing loss, bilateral: Secondary | ICD-10-CM

## 2017-07-13 DIAGNOSIS — R278 Other lack of coordination: Secondary | ICD-10-CM | POA: Diagnosis not present

## 2017-07-13 DIAGNOSIS — M62562 Muscle wasting and atrophy, not elsewhere classified, left lower leg: Secondary | ICD-10-CM | POA: Diagnosis not present

## 2017-07-13 DIAGNOSIS — R2689 Other abnormalities of gait and mobility: Secondary | ICD-10-CM | POA: Diagnosis not present

## 2017-07-13 DIAGNOSIS — F01518 Vascular dementia, unspecified severity, with other behavioral disturbance: Secondary | ICD-10-CM

## 2017-07-13 DIAGNOSIS — R634 Abnormal weight loss: Secondary | ICD-10-CM | POA: Diagnosis not present

## 2017-07-13 DIAGNOSIS — M6389 Disorders of muscle in diseases classified elsewhere, multiple sites: Secondary | ICD-10-CM | POA: Diagnosis not present

## 2017-07-13 DIAGNOSIS — R110 Nausea: Secondary | ICD-10-CM | POA: Diagnosis not present

## 2017-07-13 NOTE — Progress Notes (Signed)
Location:   [facility]   Place of Service:   Clinic  Provider: Elonzo Sopp L. Renato Gails, D.O., C.M.D.  Code Status: DNR, MOST Goals of Care:  Advanced Directives 07/13/2017  Does Patient Have a Medical Advance Directive? Yes  Type of Estate agent of [place];Living will;Out of facility DNR (pink MOST or yellow form)  Does patient want to make changes to medical advance directive? No - Patient declined  Copy of Healthcare Power of Attorney in Chart? Yes  Would patient like information on creating a medical advance directive? -  Pre-existing out of facility DNR order (yellow form or pink MOST form) Yellow form placed in chart (order not valid for inpatient use)     Chief Complaint  Patient presents with  . Medical Management of Chronic Issues    f/u from rehab stay, is back in [place]    HPI: Patient is a 84 y.o. female seen today for follow-up after rehab stay--has moved back to enhanced [facility].    Has had nausea, but no vomiting.  Staff reports her intake has diminished.  She is now on scheduled tramadol and tylenol tid and times got changed with move from rehab to [place].  This seemed to trigger the increase in nausea again yesterday and today vs in rehab.  She does have prn zofran if needed.    The back pain seems better and she did get up and walk with her walker today.  She was then worn out and didn't want to come to the appt; however, her daughter in law brought her in a wheelchair.  Pt herself c/o nausea only this morning.    She is needing more regular attention by staff to get her motivated.  I did mention this to [place].    Weight is down to 119 lbs.  She'd been 129lbs at rehab admission.   (down 10 lbs)  Past Medical History:  Diagnosis Date  . Aortic sclerosis   . Atrial fibrillation (HCC)   . Benign hypertensive heart disease without heart failure   . Benign hypertensive kidney disease with chronic kidney disease stage I through stage IV, or unspecified(403.10)    . Carpal tunnel syndrome   . Chronic anemia   . Chronic anemia   . Chronic renal disease, stage III (HCC)   . Coronary atherosclerosis of native coronary artery   . Diabetes mellitus with chronic kidney disease (HCC)   . Essential hypertension, benign   . H/O echocardiogram 07/19/09   EF = 60-65%  . Hyperglycemia    Mild  . Hyperlipidemia    Tolerating medication well as of 11/27/12  . Hypothyroidism   . Microalbuminuria 02/2010  . NSTEMI (non-ST elevated myocardial infarction) (HCC) 09/21/09   BM stent RCA  . Osteoarthritis   . Osteoporosis, senile   . Postsurgical percutaneous transluminal coronary angioplasty status   . Pure hypercholesterolemia   . Sinoatrial node dysfunction (HCC)   . Spinal stenosis   . Spinal stenosis of lumbar region   . Tachy-brady syndrome (HCC)    s/p PPM, battery change 08/01/09  . Vitamin D deficiency 10/2011    Past Surgical History:  Procedure Laterality Date  . CARPAL TUNNEL RELEASE Right 02/07/09  . CORONARY ANGIOPLASTY WITH STENT PLACEMENT  09/21/09   By Dr. Amil Amen. ACS, NSTEMI - BM stent RCA  . OOPHORECTOMY     With hysterectomy. One ovary removed-fibroids.  Marland Kitchen PACEMAKER GENERATOR CHANGE  08/04/09   By Dr. Amil Amen. New pacing generator is a  Medtronic [place], model D1939726, serial [id number]  . PACEMAKER INSERTION  2004   Gen change 08/04/09 by Dr. Amil Amen. New pacing generator is a Medtronic EnRhythm, model D1939726, serial [id number]  . PERCUTANEOUS CORONARY STENT INTERVENTION (PCI-S)  05/08/02   (3 tandem Cypher stent), proximal and mid LAD  . VESICOVAGINAL FISTULA CLOSURE W/ TAH      Allergies  Allergen Reactions  . Fosamax [Alendronate Sodium]   . Other Other (See Comments)    Allergic to crab meat, but no shellfish allergy (can eat lobster, shrimp, etc.)  Crab meat causes angioedema of the throat.  Marland Kitchen Penicillins   . Sulfa Antibiotics     Outpatient Encounter Medications as of 07/13/2017  Medication Sig  . acetaminophen  (TYLENOL) 325 MG tablet Take 650 mg by mouth 3 (three) times daily.  Marland Kitchen aspirin EC 81 MG tablet Take 81 mg by mouth daily.  Marland Kitchen CARTIA XT 120 MG 24 hr capsule Take 120 mg by mouth.   . divalproex (DEPAKOTE SPRINKLE) 125 MG capsule 2 (two) times daily.   Marland Kitchen donepezil (ARICEPT) 5 MG tablet Take 5 mg by mouth at bedtime.  Marland Kitchen ezetimibe (ZETIA) 10 MG tablet Take 10 mg by mouth at bedtime.  Marland Kitchen levothyroxine (SYNTHROID, LEVOTHROID) 75 MCG tablet Take 37.5 mcg by mouth daily before breakfast.  . methocarbamol (ROBAXIN) 500 MG tablet Take 250 mg by mouth every 8 (eight) hours as needed for muscle spasms.  . MULTAQ 400 MG tablet TAKE 1 TABLET BY MOUTH TWICE DAILY  . polyethylene glycol (MIRALAX / GLYCOLAX) packet Take 17 g by mouth daily.  . ranitidine (ZANTAC) 150 MG capsule Take 150 mg by mouth daily as needed for heartburn.  . rosuvastatin (CRESTOR) 5 MG tablet Take 5 mg by mouth at bedtime.   . sodium chloride (OCEAN) 0.65 % SOLN nasal spray Place 2 sprays into both nostrils 2 (two) times daily as needed for congestion.  . traMADol (ULTRAM) 50 MG tablet Take 50 mg by mouth 3 (three) times daily.   No facility-administered encounter medications on file as of 07/13/2017.     Review of Systems:  Review of Systems  Constitutional: Positive for weight loss. Negative for chills and fever.  HENT: Positive for hearing loss. Negative for congestion.   Eyes: Negative for blurred vision.  Respiratory: Negative for cough and shortness of breath.   Cardiovascular: Negative for chest pain, palpitations and leg swelling.  Gastrointestinal: Positive for nausea. Negative for abdominal pain, blood in stool, constipation, diarrhea, heartburn, melena and vomiting.  Genitourinary: Negative for dysuria.  Musculoskeletal: Positive for back pain. Negative for falls.  Skin: Negative for itching and rash.  Neurological: Negative for dizziness and loss of consciousness.  Endo/Heme/Allergies: Bruises/bleeds easily.    Psychiatric/Behavioral: Positive for memory loss. Negative for depression. The patient does not have insomnia.     Health Maintenance  Topic Date Due  . FOOT EXAM  04/07/2017  . PNA vac Low Risk Adult (2 of 2 - PCV13) 04/07/2017  . HEMOGLOBIN A1C  07/04/2017  . INFLUENZA VACCINE  08/18/2017  . TETANUS/TDAP  07/02/2026    Physical Exam: Vitals:   07/13/17 1059  BP: 120/60  Pulse: 98  Temp: 97.9 F (36.6 C)  TempSrc: Oral  SpO2: 97%  Weight: 119 lb (54 kg)  Height: 4\' 8"  (1.422 m)   Body mass index is 26.68 kg/m. Physical Exam  Constitutional: She appears well-developed. No distress.  HENT:  Head: Normocephalic and atraumatic.  Cardiovascular: Intact distal pulses.  irreg  irreg  Pulmonary/Chest: Effort normal and breath sounds normal. No respiratory distress.  Abdominal: Soft. Bowel sounds are normal. She exhibits no distension and no mass. There is no tenderness. There is no rebound and no guarding.  Musculoskeletal: Normal range of motion. She exhibits no tenderness.  Came in wheelchair, but did walk with walker earlier today w/o pain complaints and got up and dressed  Neurological: She is alert.  Short term memory much worse   Skin: Skin is warm and dry.  Psychiatric:  Mood not changed, gets upset and angry when she does not hear    Labs reviewed: Basic Metabolic Panel: Recent Labs    08/02/16 0600 01/03/17 01/03/17 0700 02/16/17  NA 143 138  --   --   K 5.3 5.4*  --   --   BUN 24* 26*  --   --   CREATININE 1.3* 1.4*  --   --   TSH 4.70  --  4.70 3.08   Liver Function Tests: No results for input(s): AST, ALT, ALKPHOS, BILITOT, PROT, ALBUMIN in the last 8760 hours. No results for input(s): LIPASE, AMYLASE in the last 8760 hours. No results for input(s): AMMONIA in the last 8760 hours. CBC: No results for input(s): WBC, NEUTROABS, HGB, HCT, MCV, PLT in the last 8760 hours. Lipid Panel: No results for input(s): CHOL, HDL, LDLCALC, TRIG, CHOLHDL, LDLDIRECT  in the last 8760 hours. Lab Results  Component Value Date   HGBA1C 8.3 01/03/2017    Procedures since last visit: Ct Lumbar Spine Wo Contrast  Result Date: 06/23/2017 CLINICAL DATA:  Fall 06/14/2017.  Back pain. EXAM: CT LUMBAR SPINE WITHOUT CONTRAST TECHNIQUE: Multidetector CT imaging of the lumbar spine was performed without intravenous contrast administration. Multiplanar CT image reconstructions were also generated. COMPARISON:  CT abdomen and pelvis 03/17/2004. Whole-body bone scan 10/28/2010. FINDINGS: Segmentation: 5 non rib-bearing lumbar type vertebral bodies are present. The lowest fully formed vertebral body is L5. Alignment: The moderate scoliosis is convex to the right at L3. A grade 1 anterolisthesis is present at L4-5 and L5-S1. There is slight retrolisthesis at L2-3 and L3-4. Vertebrae: Chronic sclerotic changes are present on the left at L2-3, L3-4, and L4-5 associated with scoliosis. Right-sided sclerotic changes are present at L5-S1. Vertebral body heights are maintained. No acute fracture or traumatic subluxation is present. Paraspinal and other soft tissues: Atherosclerotic calcifications are present in the aorta and branch vessels without aneurysm. Dense calcifications suggest high-grade stenosis or occlusion of the right proximal common iliac artery. Nonobstructing stones are present in the kidneys bilaterally. Ureters are within normal limits. Gallbladder is dilated, within normal limits. Disc levels: L1-2: Mild facet hypertrophy is present. There is no significant stenosis. L2-3: A broad-based disc protrusion is present. Facet hypertrophy is worse on the left. This results in moderate to severe central canal stenosis with left greater than right subarticular narrowing. Severe left and mild right foraminal stenosis is present. L3-4: Moderate central canal stenosis is worse on the left secondary to leftward disc protrusion. Moderate facet hypertrophy and spurring is worse on the left.  Moderate left and mild right subarticular stenosis is present. There is moderate foraminal stenosis bilaterally. L4-5: Severe central canal stenosis is secondary to uncovering of a broad-based disc protrusion and advanced bilateral facet hypertrophy. Moderate foraminal stenosis is worse on the left. L5-S1: Moderate subarticular narrowing is secondary to uncovering of a broad-based disc protrusion. This is worse on the right. Moderate foraminal stenosis is present bilaterally. IMPRESSION: 1. Moderate rightward scoliosis  is centered at L3-4. 2. Severe central canal stenosis at L2-3 with left greater than right subarticular narrowing. 3. Severe left and mild right foraminal narrowing at L2-3. 4. Moderate left and mild right subarticular narrowing at L3-4 with moderate foraminal narrowing bilaterally. 5. Severe central canal narrowing at L4-5. 6. Moderate foraminal stenosis at L4-5 is worse on the left. 7. Moderate right subarticular and bilateral foraminal stenosis at L5-S1. Electronically Signed   By: Marin Roberts M.D.   On: 06/23/2017 17:02   Ct Pelvis Wo Contrast  Result Date: 06/24/2017 CLINICAL DATA:  Low back and pelvic pain after fall last week. EXAM: CT PELVIS WITHOUT CONTRAST TECHNIQUE: Multidetector CT imaging of the pelvis was performed following the standard protocol without intravenous contrast. COMPARISON:  CT scan of February 26, 2004. FINDINGS: Urinary Tract:  No abnormality visualized. Bowel:  Unremarkable visualized pelvic bowel loops. Vascular/Lymphatic: Atherosclerosis of visualized abdominal aorta and iliac arteries is noted. No adenopathy is noted. Reproductive: Status post hysterectomy. No adnexal abnormality is noted. Other: No abnormal fluid collection is noted. No significant hernia is noted. Musculoskeletal: Multilevel degenerative disc disease is noted in the visualized lower lumbar spine. Hips are unremarkable. No acute osseous abnormality is noted. IMPRESSION: Multilevel  degenerative disc disease in the lower lumbar spine. No acute osseous abnormality is noted. No acute abnormality seen in the pelvis. Aortic Atherosclerosis (ICD10-I70.0). Electronically Signed   By: Lupita Raider, M.D.   On: 06/24/2017 08:31    Assessment/Plan 1. Sacral back pain -seems to be better with consistent tylenol and tramadol post rehab stay -cont to encourage her to get up and walk  2. Nausea -must have eaten pre-pain pills -prn zofran available  3. Vascular dementia with behavior disturbance -ongoing, memory getting worse, more agitated when she does not feel well or does not hear  4. Weight loss -due to decreased appetite in context of her back pain  5. Sensorineural hearing loss (SNHL) of both ears -ongoing, very HOH and gets frustrated  Labs/tests ordered:  No new Next appt:  6 mos med mgt  Reesha Debes L. Kamora Vossler, D.O. Geriatrics [facility] [address], [place] [address] Cell Phone (Mon-Fri 8am-5pm):  [phone] On Call:  [phone] & follow prompts after 5pm & weekends Office Phone:  [phone] Office Fax:  [phone]

## 2017-07-14 DIAGNOSIS — R2689 Other abnormalities of gait and mobility: Secondary | ICD-10-CM | POA: Diagnosis not present

## 2017-07-14 DIAGNOSIS — M6389 Disorders of muscle in diseases classified elsewhere, multiple sites: Secondary | ICD-10-CM | POA: Diagnosis not present

## 2017-07-14 DIAGNOSIS — M62561 Muscle wasting and atrophy, not elsewhere classified, right lower leg: Secondary | ICD-10-CM | POA: Diagnosis not present

## 2017-07-14 DIAGNOSIS — F0151 Vascular dementia with behavioral disturbance: Secondary | ICD-10-CM | POA: Diagnosis not present

## 2017-07-14 DIAGNOSIS — M62562 Muscle wasting and atrophy, not elsewhere classified, left lower leg: Secondary | ICD-10-CM | POA: Diagnosis not present

## 2017-07-14 DIAGNOSIS — R278 Other lack of coordination: Secondary | ICD-10-CM | POA: Diagnosis not present

## 2017-07-15 DIAGNOSIS — R2689 Other abnormalities of gait and mobility: Secondary | ICD-10-CM | POA: Diagnosis not present

## 2017-07-15 DIAGNOSIS — R278 Other lack of coordination: Secondary | ICD-10-CM | POA: Diagnosis not present

## 2017-07-15 DIAGNOSIS — M62561 Muscle wasting and atrophy, not elsewhere classified, right lower leg: Secondary | ICD-10-CM | POA: Diagnosis not present

## 2017-07-15 DIAGNOSIS — M62562 Muscle wasting and atrophy, not elsewhere classified, left lower leg: Secondary | ICD-10-CM | POA: Diagnosis not present

## 2017-07-15 DIAGNOSIS — M6389 Disorders of muscle in diseases classified elsewhere, multiple sites: Secondary | ICD-10-CM | POA: Diagnosis not present

## 2017-07-15 DIAGNOSIS — F0151 Vascular dementia with behavioral disturbance: Secondary | ICD-10-CM | POA: Diagnosis not present

## 2017-07-18 DIAGNOSIS — I10 Essential (primary) hypertension: Secondary | ICD-10-CM | POA: Diagnosis not present

## 2017-07-18 DIAGNOSIS — R278 Other lack of coordination: Secondary | ICD-10-CM | POA: Diagnosis not present

## 2017-07-18 DIAGNOSIS — I69351 Hemiplegia and hemiparesis following cerebral infarction affecting right dominant side: Secondary | ICD-10-CM | POA: Diagnosis not present

## 2017-07-18 DIAGNOSIS — M62561 Muscle wasting and atrophy, not elsewhere classified, right lower leg: Secondary | ICD-10-CM | POA: Diagnosis not present

## 2017-07-18 DIAGNOSIS — M48061 Spinal stenosis, lumbar region without neurogenic claudication: Secondary | ICD-10-CM | POA: Diagnosis not present

## 2017-07-18 DIAGNOSIS — R2689 Other abnormalities of gait and mobility: Secondary | ICD-10-CM | POA: Diagnosis not present

## 2017-07-18 DIAGNOSIS — R42 Dizziness and giddiness: Secondary | ICD-10-CM | POA: Diagnosis not present

## 2017-07-18 DIAGNOSIS — M62562 Muscle wasting and atrophy, not elsewhere classified, left lower leg: Secondary | ICD-10-CM | POA: Diagnosis not present

## 2017-07-18 DIAGNOSIS — I129 Hypertensive chronic kidney disease with stage 1 through stage 4 chronic kidney disease, or unspecified chronic kidney disease: Secondary | ICD-10-CM | POA: Diagnosis not present

## 2017-07-18 DIAGNOSIS — F0151 Vascular dementia with behavioral disturbance: Secondary | ICD-10-CM | POA: Diagnosis not present

## 2017-07-20 ENCOUNTER — Encounter: Payer: Medicare Other | Admitting: Internal Medicine

## 2017-07-20 DIAGNOSIS — F0151 Vascular dementia with behavioral disturbance: Secondary | ICD-10-CM | POA: Diagnosis not present

## 2017-07-20 DIAGNOSIS — M62562 Muscle wasting and atrophy, not elsewhere classified, left lower leg: Secondary | ICD-10-CM | POA: Diagnosis not present

## 2017-07-20 DIAGNOSIS — R278 Other lack of coordination: Secondary | ICD-10-CM | POA: Diagnosis not present

## 2017-07-20 DIAGNOSIS — M62561 Muscle wasting and atrophy, not elsewhere classified, right lower leg: Secondary | ICD-10-CM | POA: Diagnosis not present

## 2017-07-20 DIAGNOSIS — I10 Essential (primary) hypertension: Secondary | ICD-10-CM | POA: Diagnosis not present

## 2017-07-20 DIAGNOSIS — R2689 Other abnormalities of gait and mobility: Secondary | ICD-10-CM | POA: Diagnosis not present

## 2017-07-21 DIAGNOSIS — F0151 Vascular dementia with behavioral disturbance: Secondary | ICD-10-CM | POA: Diagnosis not present

## 2017-07-21 DIAGNOSIS — M62562 Muscle wasting and atrophy, not elsewhere classified, left lower leg: Secondary | ICD-10-CM | POA: Diagnosis not present

## 2017-07-21 DIAGNOSIS — I10 Essential (primary) hypertension: Secondary | ICD-10-CM | POA: Diagnosis not present

## 2017-07-21 DIAGNOSIS — M62561 Muscle wasting and atrophy, not elsewhere classified, right lower leg: Secondary | ICD-10-CM | POA: Diagnosis not present

## 2017-07-21 DIAGNOSIS — R278 Other lack of coordination: Secondary | ICD-10-CM | POA: Diagnosis not present

## 2017-07-21 DIAGNOSIS — R2689 Other abnormalities of gait and mobility: Secondary | ICD-10-CM | POA: Diagnosis not present

## 2017-07-22 DIAGNOSIS — F0151 Vascular dementia with behavioral disturbance: Secondary | ICD-10-CM | POA: Diagnosis not present

## 2017-07-22 DIAGNOSIS — M62561 Muscle wasting and atrophy, not elsewhere classified, right lower leg: Secondary | ICD-10-CM | POA: Diagnosis not present

## 2017-07-22 DIAGNOSIS — I10 Essential (primary) hypertension: Secondary | ICD-10-CM | POA: Diagnosis not present

## 2017-07-22 DIAGNOSIS — M62562 Muscle wasting and atrophy, not elsewhere classified, left lower leg: Secondary | ICD-10-CM | POA: Diagnosis not present

## 2017-07-22 DIAGNOSIS — R278 Other lack of coordination: Secondary | ICD-10-CM | POA: Diagnosis not present

## 2017-07-22 DIAGNOSIS — R2689 Other abnormalities of gait and mobility: Secondary | ICD-10-CM | POA: Diagnosis not present

## 2017-07-29 ENCOUNTER — Non-Acute Institutional Stay: Payer: Medicare Other | Admitting: Adult Health

## 2017-07-29 ENCOUNTER — Encounter: Payer: Self-pay | Admitting: Adult Health

## 2017-07-29 DIAGNOSIS — R110 Nausea: Secondary | ICD-10-CM

## 2017-07-29 DIAGNOSIS — R569 Unspecified convulsions: Secondary | ICD-10-CM | POA: Diagnosis not present

## 2017-07-29 DIAGNOSIS — R634 Abnormal weight loss: Secondary | ICD-10-CM | POA: Diagnosis not present

## 2017-07-29 DIAGNOSIS — E860 Dehydration: Secondary | ICD-10-CM | POA: Diagnosis not present

## 2017-07-29 DIAGNOSIS — D649 Anemia, unspecified: Secondary | ICD-10-CM | POA: Diagnosis not present

## 2017-07-29 DIAGNOSIS — R1110 Vomiting, unspecified: Secondary | ICD-10-CM | POA: Diagnosis not present

## 2017-07-29 DIAGNOSIS — I10 Essential (primary) hypertension: Secondary | ICD-10-CM | POA: Diagnosis not present

## 2017-07-29 LAB — CBC AND DIFFERENTIAL
HEMATOCRIT: 46 (ref 36–46)
Hemoglobin: 14.7 (ref 12.0–16.0)
PLATELETS: 369 (ref 150–399)

## 2017-07-29 LAB — HEPATIC FUNCTION PANEL
ALT: 48 — AB (ref 7–35)
AST: 121 — AB (ref 13–35)
Alkaline Phosphatase: 541 — AB (ref 25–125)
BILIRUBIN, TOTAL: 0.4

## 2017-07-29 LAB — BASIC METABOLIC PANEL
BUN: 20 (ref 4–21)
Creatinine: 1.1 (ref 0.5–1.1)
GLUCOSE: 124
POTASSIUM: 4.5 (ref 3.4–5.3)
Sodium: 136 — AB (ref 137–147)

## 2017-07-29 LAB — HM DIABETES FOOT EXAM

## 2017-07-29 NOTE — Progress Notes (Signed)
[facility]   Place of Service:  ALF (13) Provider:   Peggye Leyhristy Marlean Mortell, ANP [facility] [phone]   Kermit Baloeed, Tiffany L, DO  Patient Care Team: Kermit Baloeed, Tiffany L, DO as PCP - General (Geriatric Medicine) Early, Kristen Loaderodd F, MD as Consulting Physician (Vascular Surgery) Ranelle OysterSwartz, Zachary T, MD as Consulting Physician (Physical Medicine and Rehabilitation) Micki RileySethi, Pramod S, MD as Consulting Physician (Neurology) Hillis RangeAllred, James, MD as Consulting Physician (Cardiology) Mckinley JewelStoneburner, Sara, MD as Consulting Physician (Ophthalmology) Salvatore MarvelWainer, Robert, MD as Consulting Physician (Orthopedic Surgery) Marcine Matarahlstedt, Stephen, MD as Consulting Physician (Urology)  Extended Emergency Contact Information Primary Emergency Contact: Shirlean KellyNudelman,Robert Address: [address]          [place], [place] Home Phone: [phone] Relation: Son Secondary Emergency Contact: Despina HiddenNudleman,Emily  [place] Mobile Phone: [phone] Relation: Other  Code Status:  DNR Goals of care: Advanced Directive information Advanced Directives 07/13/2017  Does Patient Have a Medical Advance Directive? Yes  Type of Estate agentAdvance Directive Healthcare Power of [place];Living will;Out of facility DNR (pink MOST or yellow form)  Does patient want to make changes to medical advance directive? No - Patient declined  Copy of Healthcare Power of Attorney in Chart? Yes  Would patient like information on creating a medical advance directive? -  Pre-existing out of facility DNR order (yellow form or pink MOST form) Yellow form placed in chart (order not valid for inpatient use)     Chief Complaint  Patient presents with  . Acute Visit    nausea, weight loss, and decreased appetite    HPI:  Pt is a 84 y.o. female seen today for an acute visit for nausea, weight loss, and decreased appetite present for the past month but worse over the past week. The staff report that  she has lost 10 lbs over the past month. Over the past week she is barely eating at all. She intermittently reports nausea but no vomiting. She also reports a bad taste in her mouth. She resides in [place] but has progressively required more care due to her dementia and is a poor historian. She can be combative with staff and she is therefore difficult to assess. Her VS were taken and are WNL except her BP as she was too combative to obtain this reading. She has had recent low back and sacral pain has been on scheduled ultram. CT scan of the lumbar and sacral area showd spinal stenosis in the L2-5 area.  Staff report the pain has subsided. During my visit she asked for a sip of water and then dry heaved twice in the trash can. She denies abd pain, back pain or shoulder pain but was difficult to communicate with due to her dementia. No urine out put last evening either.    Past Medical History:  Diagnosis Date  . Aortic sclerosis   . Atrial fibrillation (HCC)   . Benign hypertensive heart disease without heart failure   . Benign hypertensive kidney disease with chronic kidney disease stage I through stage IV, or unspecified(403.10)   . Carpal tunnel syndrome   . Chronic anemia   . Chronic anemia   . Chronic renal disease, stage III (HCC)   . Coronary atherosclerosis of native coronary artery   . Diabetes mellitus with chronic kidney disease (HCC)   . Essential hypertension, benign   . H/O echocardiogram 07/19/09   EF = 60-65%  . Hyperglycemia    Mild  . Hyperlipidemia  Tolerating medication well as of 11/27/12  . Hypothyroidism   . Microalbuminuria 02/2010  . NSTEMI (non-ST elevated myocardial infarction) (HCC) 09/21/09   BM stent RCA  . Osteoarthritis   . Osteoporosis, senile   . Postsurgical percutaneous transluminal coronary angioplasty status   . Pure hypercholesterolemia   . Sinoatrial node dysfunction (HCC)   . Spinal stenosis   . Spinal stenosis of lumbar region   . Tachy-brady  syndrome (HCC)    s/p PPM, battery change 08/01/09  . Vitamin D deficiency 10/2011   Past Surgical History:  Procedure Laterality Date  . CARPAL TUNNEL RELEASE Right 02/07/09  . CORONARY ANGIOPLASTY WITH STENT PLACEMENT  09/21/09   By Dr. Amil Amen. ACS, NSTEMI - BM stent RCA  . OOPHORECTOMY     With hysterectomy. One ovary removed-fibroids.  Marland Kitchen PACEMAKER GENERATOR CHANGE  08/04/09   By Dr. Amil Amen. New pacing generator is a Medtronic EnRhythm, model D1939726, serial [id number]  . PACEMAKER INSERTION  2004   Gen change 08/04/09 by Dr. Amil Amen. New pacing generator is a Medtronic EnRhythm, model D1939726, serial [id number]  . PERCUTANEOUS CORONARY STENT INTERVENTION (PCI-S)  05/08/02   (3 tandem Cypher stent), proximal and mid LAD  . VESICOVAGINAL FISTULA CLOSURE W/ TAH      Allergies  Allergen Reactions  . Fosamax [Alendronate Sodium]   . Other Other (See Comments)    Allergic to crab meat, but no shellfish allergy (can eat lobster, shrimp, etc.)  Crab meat causes angioedema of the throat.  Marland Kitchen Penicillins   . Sulfa Antibiotics     Outpatient Encounter Medications as of 07/29/2017  Medication Sig  . acetaminophen (TYLENOL) 325 MG tablet Take 650 mg by mouth 3 (three) times daily.  Marland Kitchen aspirin EC 81 MG tablet Take 81 mg by mouth daily.  Marland Kitchen CARTIA XT 120 MG 24 hr capsule Take 120 mg by mouth.   . divalproex (DEPAKOTE SPRINKLE) 125 MG capsule 2 (two) times daily.   Marland Kitchen donepezil (ARICEPT) 5 MG tablet Take 5 mg by mouth at bedtime.  Marland Kitchen ezetimibe (ZETIA) 10 MG tablet Take 10 mg by mouth at bedtime.  Marland Kitchen levothyroxine (SYNTHROID, LEVOTHROID) 75 MCG tablet Take 37.5 mcg by mouth daily before breakfast.  . methocarbamol (ROBAXIN) 500 MG tablet Take 250 mg by mouth every 8 (eight) hours as needed for muscle spasms.  . MULTAQ 400 MG tablet TAKE 1 TABLET BY MOUTH TWICE DAILY  . polyethylene glycol (MIRALAX / GLYCOLAX) packet Take 17 g by mouth daily.  . ranitidine (ZANTAC) 150 MG capsule Take 150 mg by  mouth daily as needed for heartburn.  . rosuvastatin (CRESTOR) 5 MG tablet Take 5 mg by mouth at bedtime.   . sodium chloride (OCEAN) 0.65 % SOLN nasal spray Place 2 sprays into both nostrils 2 (two) times daily as needed for congestion.  . traMADol (ULTRAM) 50 MG tablet Take 50 mg by mouth 3 (three) times daily.   No facility-administered encounter medications on file as of 07/29/2017.     Review of Systems  Constitutional: Positive for activity change, appetite change and unexpected weight change. Negative for chills, diaphoresis, fatigue and fever.  HENT: Negative for congestion.   Respiratory: Negative for cough, shortness of breath and wheezing.   Cardiovascular: Negative for chest pain, palpitations and leg swelling.  Gastrointestinal: Positive for nausea. Negative for abdominal distention, abdominal pain, constipation, diarrhea and vomiting.  Genitourinary: Negative for difficulty urinating and dysuria.  Musculoskeletal: Positive for gait problem. Negative for back pain.  Skin:  Negative for wound.  Neurological: Negative for tremors, speech difficulty and weakness.  Psychiatric/Behavioral: Positive for agitation, behavioral problems and confusion.       Chronic     Immunization History  Administered Date(s) Administered  . Influenza Inj Mdck Quad Pf 11/06/2015  . Influenza-Unspecified 11/09/2012, 10/31/2014, 11/08/2016  . Pneumococcal Polysaccharide-23 03/23/2005, 04/07/2016  . Td 03/18/2004  . Tdap 07/01/2016  . Zoster 08/07/2007  . Zoster Recombinat (Shingrix) 02/21/2017   Pertinent  Health Maintenance Due  Topic Date Due  . PNA vac Low Risk Adult (2 of 2 - PCV13) 04/07/2017  . INFLUENZA VACCINE  08/18/2017  . HEMOGLOBIN A1C  01/11/2018  . FOOT EXAM  07/30/2018   Fall Risk  07/13/2017 07/06/2017 01/05/2017 08/25/2016 06/30/2016  Falls in the past year? No No No No No  Number falls in past yr: - - - - -  Injury with Fall? - - - - -  Risk for fall due to : - - - - -    Functional Status Survey:   Unable to obtain BP due to combativeness, staff to attempt again after a period of rest Vitals:   07/29/17 1204  Pulse: 62  Resp: 20  Temp: (!) 97.2 F (36.2 C)  SpO2: 98%   There is no height or weight on file to calculate BMI. Physical Exam  Constitutional: No distress.  HENT:  Head: Normocephalic and atraumatic.  Mouth/Throat: Oropharynx is clear and moist.  Eyes: Conjunctivae are normal. Right eye exhibits no discharge. Left eye exhibits no discharge.  Neck: No JVD present.  Cardiovascular: Normal rate and regular rhythm.  No murmur heard. Pulmonary/Chest: Effort normal and breath sounds normal.  Abdominal: Soft. Bowel sounds are normal. She exhibits no distension. There is no tenderness.  Lymphadenopathy:    She has no cervical adenopathy.  Neurological: She is alert.  Not able to answer questions or follow commands, became combative   Skin: Skin is warm and dry. She is not diaphoretic.  exam limited by resident combativeness  Labs reviewed: Recent Labs    08/02/16 0600 01/03/17 07/12/17 0300  NA 143 138 140  K 5.3 5.4* 4.3  BUN 24* 26* 16  CREATININE 1.3* 1.4* 1.2*   Recent Labs    07/12/17 0300  AST 120*  ALT 48*  ALKPHOS 298*   Recent Labs    07/12/17 0300  WBC 14.1  HGB 13.8  HCT 44  PLT 337   Lab Results  Component Value Date   TSH 2.21 07/12/2017   Lab Results  Component Value Date   HGBA1C 8.0 07/12/2017   Lab Results  Component Value Date   CHOL 135 04/05/2016   HDL 48 04/05/2016   LDLCALC 60 04/05/2016   TRIG 137 04/05/2016   CHOLHDL 2.9 05/09/2014    Significant Diagnostic Results in last 30 days:  No results found.  Assessment/Plan 1. Nausea Check CMP, amylase, and lipase for electrolyte abnormalities and or GI source Continue prn zofran Discontinue ultram as this may be a cause of her nausea Also discontinue Aricept due to lack of benefit/nausea Magic mouthwash 5 ml QID x 7 days for dry  mouth with "bad taste" Discontinue crestor Hold evening dose of depakote and in the am start depakote 125 mg BID  2. Dehydration NS at 80cc/hr x 1 liter  3. Weight loss Due to poor appetite and dehydration ?if this is due to progressive dementia or underlying process will follow labs up with appropriate testing.  Family/ staff Communication: I discussed her case with her son, Dr. Newell Coral and we agreed to the above plan  Labs/tests ordered:  CBC, CMP, Depakote level, lipase, and amylase

## 2017-08-01 ENCOUNTER — Non-Acute Institutional Stay: Payer: Medicare Other | Admitting: Adult Health

## 2017-08-01 ENCOUNTER — Encounter: Payer: Self-pay | Admitting: Adult Health

## 2017-08-01 DIAGNOSIS — Z7189 Other specified counseling: Secondary | ICD-10-CM

## 2017-08-01 DIAGNOSIS — M79604 Pain in right leg: Secondary | ICD-10-CM

## 2017-08-01 DIAGNOSIS — D649 Anemia, unspecified: Secondary | ICD-10-CM | POA: Diagnosis not present

## 2017-08-01 DIAGNOSIS — R110 Nausea: Secondary | ICD-10-CM | POA: Diagnosis not present

## 2017-08-01 DIAGNOSIS — N189 Chronic kidney disease, unspecified: Secondary | ICD-10-CM | POA: Diagnosis not present

## 2017-08-01 DIAGNOSIS — K838 Other specified diseases of biliary tract: Secondary | ICD-10-CM | POA: Diagnosis not present

## 2017-08-01 DIAGNOSIS — E118 Type 2 diabetes mellitus with unspecified complications: Secondary | ICD-10-CM | POA: Diagnosis not present

## 2017-08-01 DIAGNOSIS — I10 Essential (primary) hypertension: Secondary | ICD-10-CM | POA: Diagnosis not present

## 2017-08-01 DIAGNOSIS — M79605 Pain in left leg: Secondary | ICD-10-CM

## 2017-08-01 DIAGNOSIS — E440 Moderate protein-calorie malnutrition: Secondary | ICD-10-CM

## 2017-08-01 DIAGNOSIS — R945 Abnormal results of liver function studies: Secondary | ICD-10-CM | POA: Diagnosis not present

## 2017-08-01 NOTE — Progress Notes (Signed)
Location:  Occupational psychologist of Service:  ALF (13) Provider:   Cindi Carbon, ANP [place] [phone]   Gayland Curry, DO  Patient Care Team: Gayland Curry, DO as PCP - General (Geriatric Medicine) Early, Arvilla Meres, MD as Consulting Physician (Vascular Surgery) Meredith Staggers, MD as Consulting Physician (Physical Medicine and Rehabilitation) Garvin Fila, MD as Consulting Physician (Neurology) Thompson Grayer, MD as Consulting Physician (Cardiology) Shon Hough, MD as Consulting Physician (Ophthalmology) Elsie Saas, MD as Consulting Physician (Orthopedic Surgery) Franchot Gallo, MD as Consulting Physician (Urology)  Extended Emergency Contact Information Primary Emergency Contact: Jovita Gamma Address: [place], [place] Phone: [phone] Relation: Son Secondary Emergency Contact: Julious Payer [place] Phone: [phone] Relation: Other  Code Status:  DNR Goals of care: Advanced Directive information Advanced Directives 07/13/2017  Does Patient Have a Medical Advance Directive? Yes  Type of Paramedic of [place];Living will;Out of facility DNR (pink MOST or yellow form)  Does patient want to make changes to medical advance directive? No - Patient declined  Copy of [place] in Chart? Yes  Would patient like information on creating a medical advance directive? -  Pre-existing out of facility DNR order (yellow form or pink MOST form) Yellow form placed in chart (order not valid for inpatient use)     Chief Complaint  Patient presents with  . Acute Visit    f/u nausea and elevated liver enzymes    HPI:  Pt is a 84 y.o. female with a hx of dementia, seen today for an acute visit for follow up regarding nausea and elevated liver enzymes with weight loss. Ms. Batty has had progressive  weight loss of 10 lbs over the past month, as well as nausea. She has been treated with ultram for low back and sacral pain which was felt to be the cause. On 7/12 she was seen for worsening nausea and decreased appetite and found to have elevated LFTS and alk phos. Amylase and lipase were normal. GGT was not able to be drawn and is pending at the time of this note, as well as the repeat CBC and CMP. Depakote, aricept, ultram and tylenol were discontinued. The nausea has improved but she continues to have low appetite, only taking in liquids and a bite or two of food. She appears weaker and needs two people to get out of bed. I ordered an IV for hydration on 7/12 but due to her combativeness it was not completed. Renal function and electrolytes were WNL on 7/12.  ABD [place] was ordered see below.   ABD US:08/01/17 abnormal gallbladder with collection of echogenic material measuring 4 cm in diameter. This could be neoplastic process or most likely a collection shadowing stones or sludge. The common bile duct measures 1.6 cm but no stones in the common bile duct are seen and no evidence of intrahepatic biliary dilatation could be identified , the gall bladder is moderately distended  Resident also reports muscle cramps/achiness to her legs now that she is off tylenol.  Past Medical History:  Diagnosis Date  . Aortic sclerosis   . Atrial fibrillation ([place])   . Benign hypertensive heart disease without heart failure   . Benign hypertensive kidney disease with chronic kidney disease stage I through stage IV, or unspecified(403.10)   . Carpal tunnel syndrome   .  Chronic anemia   . Chronic anemia   . Chronic renal disease, stage III ([place])   . Coronary atherosclerosis of native coronary artery   . Diabetes mellitus with chronic kidney disease ([place])   . Essential hypertension, benign   . H/O echocardiogram 07/19/09   EF = 60-65%  . Hyperglycemia    Mild  . Hyperlipidemia    Tolerating medication well as of  11/27/12  . Hypothyroidism   . Microalbuminuria 02/2010  . NSTEMI (non-ST elevated myocardial infarction) ([place]) 09/21/09   BM stent RCA  . Osteoarthritis   . Osteoporosis, senile   . Postsurgical percutaneous transluminal coronary angioplasty status   . Pure hypercholesterolemia   . Sinoatrial node dysfunction ([facility])   . Spinal stenosis   . Spinal stenosis of lumbar region   . Tachy-brady syndrome ([place])    s/p PPM, battery change 08/01/09  . Vitamin D deficiency 10/2011   Past Surgical History:  Procedure Laterality Date  . CARPAL TUNNEL RELEASE Right 02/07/09  . CORONARY ANGIOPLASTY WITH STENT PLACEMENT  09/21/09   By Dr. Leonia Reeves. ACS, NSTEMI - BM stent RCA  . OOPHORECTOMY     With hysterectomy. One ovary removed-fibroids.  Marland Kitchen PACEMAKER GENERATOR CHANGE  08/04/09   By Dr. Leonia Reeves. New pacing generator is a Medtronic EnRhythm, model H8937337, serial [id number]  . PACEMAKER INSERTION  2004   Gen change 08/04/09 by Dr. Leonia Reeves. New pacing generator is a Medtronic EnRhythm, model H8937337, serial [id number]  . PERCUTANEOUS CORONARY STENT INTERVENTION (PCI-S)  05/08/02   (3 tandem Cypher stent), proximal and mid LAD  . VESICOVAGINAL FISTULA CLOSURE W/ TAH      Allergies  Allergen Reactions  . Fosamax [Alendronate Sodium]   . Other Other (See Comments)    Allergic to crab meat, but no shellfish allergy (can eat lobster, shrimp, etc.)  Crab meat causes angioedema of the throat.  Marland Kitchen Penicillins   . Sulfa Antibiotics     Outpatient Encounter Medications as of 08/01/2017  Medication Sig  . magic mouthwash SOLN Take 5 mLs by mouth 4 (four) times daily.  Marland Kitchen aspirin EC 81 MG tablet Take 81 mg by mouth daily.  Marland Kitchen CARTIA XT 120 MG 24 hr capsule Take 120 mg by mouth.   . ezetimibe (ZETIA) 10 MG tablet Take 10 mg by mouth at bedtime.  Marland Kitchen levothyroxine (SYNTHROID, LEVOTHROID) 75 MCG tablet Take 37.5 mcg by mouth daily before breakfast.  . methocarbamol (ROBAXIN) 500 MG tablet Take 250 mg by mouth  every 8 (eight) hours as needed for muscle spasms.  . MULTAQ 400 MG tablet TAKE 1 TABLET BY MOUTH TWICE DAILY  . polyethylene glycol (MIRALAX / GLYCOLAX) packet Take 17 g by mouth daily.  . ranitidine (ZANTAC) 150 MG capsule Take 150 mg by mouth daily as needed for heartburn.  . sodium chloride (OCEAN) 0.65 % SOLN nasal spray Place 2 sprays into both nostrils 2 (two) times daily as needed for congestion.  . [DISCONTINUED] acetaminophen (TYLENOL) 325 MG tablet Take 650 mg by mouth 3 (three) times daily.  . [DISCONTINUED] divalproex (DEPAKOTE SPRINKLE) 125 MG capsule 2 (two) times daily.   . [DISCONTINUED] donepezil (ARICEPT) 5 MG tablet Take 5 mg by mouth at bedtime.  . [DISCONTINUED] rosuvastatin (CRESTOR) 5 MG tablet Take 5 mg by mouth at bedtime.   . [DISCONTINUED] traMADol (ULTRAM) 50 MG tablet Take 50 mg by mouth 3 (three) times daily.   No facility-administered encounter medications on file as of 08/01/2017.  Review of Systems  Constitutional: Positive for activity change, appetite change, fatigue and unexpected weight change. Negative for chills, diaphoresis and fever.  HENT: Negative for congestion.   Eyes: Negative for pain, discharge, redness and itching.       "weak vision", dry  Respiratory: Negative for cough, shortness of breath and wheezing.   Cardiovascular: Negative for chest pain, palpitations and leg swelling.  Gastrointestinal: Negative for abdominal distention, abdominal pain, blood in stool, constipation, diarrhea, nausea and vomiting.  Genitourinary: Negative for difficulty urinating and dysuria.  Musculoskeletal: Positive for arthralgias, gait problem and myalgias. Negative for back pain.  Skin: Negative for rash and wound.  Neurological: Positive for facial asymmetry and weakness. Negative for dizziness, tremors, seizures, syncope, speech difficulty, light-headedness, numbness and headaches.  Psychiatric/Behavioral: Positive for confusion. Negative for behavioral  problems.       Behaviors are improved    Immunization History  Administered Date(s) Administered  . Influenza Inj Mdck Quad Pf 11/06/2015  . Influenza-Unspecified 11/09/2012, 10/31/2014, 11/08/2016  . Pneumococcal Polysaccharide-23 03/23/2005, 04/07/2016  . Td 03/18/2004  . Tdap 07/01/2016  . Zoster 08/07/2007  . Zoster Recombinat (Shingrix) 02/21/2017   Pertinent  Health Maintenance Due  Topic Date Due  . PNA vac Low Risk Adult (2 of 2 - PCV13) 04/07/2017  . INFLUENZA VACCINE  08/18/2017  . HEMOGLOBIN A1C  01/11/2018  . FOOT EXAM  07/30/2018   Fall Risk  07/13/2017 07/06/2017 01/05/2017 08/25/2016 06/30/2016  Falls in the past year? _0   Number falls in past yr: - - - - -  Injury with Fall? - - - - -  Risk for fall due to : - - - - -   Functional Status Survey:    Vitals:   08/01/17 1424  BP: 98/62  Pulse: 67  Resp: (!) 22  Temp: 98.4 F (36.9 C)  SpO2: 97%   There is no height or weight on file to calculate BMI. Physical Exam  Constitutional: No distress.  HENT:  Head: Normocephalic and atraumatic.  Eyes: Pupils are equal, round, and reactive to light. Conjunctivae are normal. Right eye exhibits no discharge. Left eye exhibits no discharge.  Neck: No JVD present.  Cardiovascular: Normal rate.  No murmur heard. Irregular no edema  Pulmonary/Chest: Effort normal and breath sounds normal. No respiratory distress. She has no wheezes.  Abdominal: Soft. Bowel sounds are normal. She exhibits no distension and no mass. There is no tenderness. There is no rebound and no guarding.  Musculoskeletal:  Right sided weakness as well as generalized weakness  Neurological: She is alert.  Oriented to self only. Intermittently able to f/c.    Skin: Skin is warm and dry. She is not diaphoretic.  Psychiatric: She has a normal mood and affect.  Nursing note and vitals reviewed.   Labs reviewed: Recent Labs    01/03/17 07/12/17 0300 07/29/17  NA 138 140 136*  K 5.4*  4.3 4.5  BUN 26* 16 20  CREATININE 1.4* 1.2* 1.1   Recent Labs    07/12/17 0300 07/29/17  AST 120* 121*  ALT 48* 48*  ALKPHOS 298* 541*   Recent Labs    07/12/17 0300 07/29/17  WBC 14.1  --   HGB 13.8 14.7  HCT 44 46  PLT 337 369   Lab Results  Component Value Date   TSH 2.21 07/12/2017   Lab Results  Component Value Date   HGBA1C 8.0 07/12/2017   Lab Results  Component Value Date  CHOL 135 04/05/2016   HDL 48 04/05/2016   LDLCALC 60 04/05/2016   TRIG 137 04/05/2016   CHOLHDL 2.9 05/09/2014    Significant Diagnostic Results in last 30 days:  No results found.  Assessment/Plan  1. Common bile duct dilation Non obstructive pattern with normal bilirubin ? If this is sludge in the gall bladder/stones or malignancy She is afebrile and in no pain. Remains with poor appetite. See ACP discussion. Labs pending  2. Nausea Improve, continue prn zofran Able to drink fluids and void No IVF at this point due to her advanced dementia and combative nature  3. Pain in both lower extremities ? If this is arthritic since she is now off tylenol or if this is related to an electrolyte imbalance.  BMP pending if low K treat accordingly. If no cause noted consider ordering Mg. May use prn Robaxin for leg pain  4. Moderate protein-calorie malnutrition ([place]) Due to poor intake with nausea.  5. Advanced care planning/counseling discussion I spoke with her son, Dr Sherwood Gambler. I let him know that her common bile duct is very dilated and that there is a 4cm area of sludge or a mass. She appears weaker and needs more assistance. We discussed sending her to the hospital but given her advanced dementia and age we were reluctant to do so. She is likely not a surgical candidate. If she is appropriate for a drain there is a chance that she might pull it out. Unfortunately, even if she recovered from this acute illness she remains with underlying dementia and combative behavior. However, she is  ambulatory and resides in assistance living, able to perform some activities on her own. He is going to discuss her case with surgery and his family and make a final decision regarding her care tomorrow. If we choose not to send her to the hospital it may be best to focus on comfort care and consult with hospice.    Family/ staff Communication: discussed with her son Dr. Sherwood Gambler  Labs/tests ordered:  CMP CBC GGT pending

## 2017-08-01 NOTE — ACP (Advance Care Planning) (Signed)
I spoke with her son, Dr Newell CoralNudelman. I let him know that her common bile duct is very dilated and that there is a 4cm area of sludge or a mass. She appears weaker and needs more assistance. We discussed sending her to the hospital but given her advanced dementia and age we were reluctant to do so. She is likely not a surgical candidate. If she is appropriate for a drain there is a chance that she might pull it out. Unfortunately, even if she recovered from this acute illness she remains with underlying dementia and combative behavior. However, she is ambulatory and resides in assistance living, able to perform some activities on her own. He is going to discuss her case with surgery and his family and make a final decision regarding her care tomorrow. If we choose not to send her to the hospital it may be best to focus on comfort care and consult with hospice.

## 2017-08-02 DIAGNOSIS — E118 Type 2 diabetes mellitus with unspecified complications: Secondary | ICD-10-CM | POA: Diagnosis not present

## 2017-08-02 DIAGNOSIS — N189 Chronic kidney disease, unspecified: Secondary | ICD-10-CM | POA: Diagnosis not present

## 2017-08-02 DIAGNOSIS — I10 Essential (primary) hypertension: Secondary | ICD-10-CM | POA: Diagnosis not present

## 2017-09-13 DIAGNOSIS — B351 Tinea unguium: Secondary | ICD-10-CM | POA: Diagnosis not present

## 2017-09-20 DIAGNOSIS — D649 Anemia, unspecified: Secondary | ICD-10-CM | POA: Diagnosis not present

## 2017-09-20 DIAGNOSIS — E0829 Diabetes mellitus due to underlying condition with other diabetic kidney complication: Secondary | ICD-10-CM | POA: Diagnosis not present

## 2017-09-20 DIAGNOSIS — E119 Type 2 diabetes mellitus without complications: Secondary | ICD-10-CM | POA: Diagnosis not present

## 2017-09-20 LAB — HEPATIC FUNCTION PANEL
ALT: 12 (ref 7–35)
AST: 11 — AB (ref 13–35)
Alkaline Phosphatase: 93 (ref 25–125)
BILIRUBIN, TOTAL: 0.5

## 2017-09-20 LAB — BASIC METABOLIC PANEL
BUN: 13 (ref 4–21)
Creatinine: 1.1 (ref 0.5–1.1)
GLUCOSE: 135
Potassium: 4.6 (ref 3.4–5.3)
Sodium: 140 (ref 137–147)

## 2017-09-20 LAB — CBC AND DIFFERENTIAL
HCT: 40 (ref 36–46)
HEMOGLOBIN: 13.4 (ref 12.0–16.0)
Platelets: 340 (ref 150–399)
WBC: 11.3

## 2017-09-20 LAB — HEMOGLOBIN A1C: HEMOGLOBIN A1C: 6.3

## 2017-09-22 ENCOUNTER — Non-Acute Institutional Stay (SKILLED_NURSING_FACILITY): Payer: Medicare Other | Admitting: Adult Health

## 2017-09-22 ENCOUNTER — Encounter: Payer: Self-pay | Admitting: Adult Health

## 2017-09-22 DIAGNOSIS — R5381 Other malaise: Secondary | ICD-10-CM | POA: Diagnosis not present

## 2017-09-22 DIAGNOSIS — F01518 Vascular dementia, unspecified severity, with other behavioral disturbance: Secondary | ICD-10-CM

## 2017-09-22 DIAGNOSIS — R634 Abnormal weight loss: Secondary | ICD-10-CM

## 2017-09-22 DIAGNOSIS — E039 Hypothyroidism, unspecified: Secondary | ICD-10-CM

## 2017-09-22 DIAGNOSIS — I10 Essential (primary) hypertension: Secondary | ICD-10-CM

## 2017-09-22 DIAGNOSIS — E118 Type 2 diabetes mellitus with unspecified complications: Secondary | ICD-10-CM | POA: Diagnosis not present

## 2017-09-22 DIAGNOSIS — F0151 Vascular dementia with behavioral disturbance: Secondary | ICD-10-CM | POA: Diagnosis not present

## 2017-09-22 DIAGNOSIS — I480 Paroxysmal atrial fibrillation: Secondary | ICD-10-CM | POA: Diagnosis not present

## 2017-09-22 DIAGNOSIS — I69351 Hemiplegia and hemiparesis following cerebral infarction affecting right dominant side: Secondary | ICD-10-CM

## 2017-09-22 NOTE — Progress Notes (Signed)
Location:  Medical illustrator of Service:  SNF (31) Provider:   [name], ANP [facility] [phone]   Kermit Balo, DO  Patient Care Team: Kermit Balo, DO as PCP - General (Geriatric Medicine) Early, Kristen Loader, MD as Consulting Physician (Vascular Surgery) Ranelle Oyster, MD as Consulting Physician (Physical Medicine and Rehabilitation) Micki Riley, MD as Consulting Physician (Neurology) Hillis Range, MD as Consulting Physician (Cardiology) Mckinley Jewel, MD as Consulting Physician (Ophthalmology) Salvatore Marvel, MD as Consulting Physician (Orthopedic Surgery) Marcine Matar, MD as Consulting Physician (Urology)  Extended Emergency Contact Information Primary Emergency Contact: Shirlean Kelly Address: [address]          [place], [place] Home Phone: [phone] Relation: Son Secondary Emergency Contact: Despina [place] Mobile Phone: [phone] Relation: Other  Code Status:  DNR Goals of care: Advanced Directive information Advanced Directives 09/24/2017  Does Patient Have a Medical Advance Directive? Yes  Type of Estate agent of [place];Living will;Out of facility DNR (pink MOST or yellow form)  Does patient want to make changes to medical advance directive? No - Patient declined  Copy of Healthcare Power of Attorney in Chart? Yes  Would patient like information on creating a medical advance directive? -  Pre-existing out of facility DNR order (yellow form or pink MOST form) Yellow form placed in chart (order not valid for inpatient use)     Chief Complaint  Patient presents with  . Acute Visit    agitation    HPI:  Pt is a 84 y.o. female seen today as an acute visit for agitation. She was admitted to skilled care from assisted living on 09/21/17.  She has had an overall decline in cognition and function and needed additional  assistance. Her history includes dementia, stroke with right sided weakness, afib, CAD, CKD, DM II, HLD, HTN, MI, OP, and pacemaker. In July she had elevated liver enzymes and decreased appetite and an abd ultrasound revealed a dilated bile duct and echogenic material concerning for stones vs a neoplastic process. Since that time she is weaker and no longer walks, mainly just transfers to the wheelchair. Her appetite has remained small but her condition has improved with more alertness and energy. Despite no treatment for the elevated liver enzymes they were rechecked on 09/20/17 and returned completely normal.  When she moved to skilled care apparently it was quite difficult and chaotic. She has been yelling "help help" regularly and is more confused and agitated. She was previously taken off depakote due to elevated liver enzymes. The nurses state that once she escalates it is very difficult to get sedatives in her orally.   Past Medical History:  Diagnosis Date  . Aortic sclerosis   . Atrial fibrillation (HCC)   . Benign hypertensive heart disease without heart failure   . Benign hypertensive kidney disease with chronic kidney disease stage I through stage IV, or unspecified(403.10)   . Carpal tunnel syndrome   . Chronic anemia   . Chronic anemia   . Chronic renal disease, stage III (HCC)   . Coronary atherosclerosis of native coronary artery   . Diabetes mellitus with chronic kidney disease (HCC)   . Essential hypertension, benign   . H/O echocardiogram 07/19/09   EF = 60-65%  . Hyperglycemia    Mild  . Hyperlipidemia    Tolerating medication well as of 11/27/12  . Hypothyroidism   . Microalbuminuria  02/2010  . NSTEMI (non-ST elevated myocardial infarction) (HCC) 09/21/09   BM stent RCA  . Osteoarthritis   . Osteoporosis, senile   . Postsurgical percutaneous transluminal coronary angioplasty status   . Pure hypercholesterolemia   . Sinoatrial node dysfunction (HCC)   . Spinal stenosis     . Spinal stenosis of lumbar region   . Tachy-brady syndrome (HCC)    s/p PPM, battery change 08/01/09  . Vitamin D deficiency 10/2011   Past Surgical History:  Procedure Laterality Date  . CARPAL TUNNEL RELEASE Right 02/07/09  . CORONARY ANGIOPLASTY WITH STENT PLACEMENT  09/21/09   By Dr. Amil Amen. ACS, NSTEMI - BM stent RCA  . OOPHORECTOMY     With hysterectomy. One ovary removed-fibroids.  Marland Kitchen PACEMAKER GENERATOR CHANGE  08/04/09   By Dr. Amil Amen. New pacing generator is a Medtronic EnRhythm, model D1939726, serial [id number]  . PACEMAKER INSERTION  2004   Gen change 08/04/09 by Dr. Amil Amen. New pacing generator is a Medtronic EnRhythm, model D1939726, serial [id number]  . PERCUTANEOUS CORONARY STENT INTERVENTION (PCI-S)  05/08/02   (3 tandem Cypher stent), proximal and mid LAD  . VESICOVAGINAL FISTULA CLOSURE W/ TAH      Allergies  Allergen Reactions  . Fosamax [Alendronate Sodium]   . Other Other (See Comments)    Allergic to crab meat, but no shellfish allergy (can eat lobster, shrimp, etc.)  Crab meat causes angioedema of the throat.  Marland Kitchen Penicillins   . Sulfa Antibiotics     Outpatient Encounter Medications as of 09/22/2017  Medication Sig  . aspirin EC 81 MG tablet Take 81 mg by mouth daily.  Marland Kitchen CARTIA XT 120 MG 24 hr capsule Take 120 mg by mouth.   . levothyroxine (SYNTHROID, LEVOTHROID) 75 MCG tablet Take 37.5 mcg by mouth daily before breakfast.  . magic mouthwash SOLN Take 5 mLs by mouth 4 (four) times daily.  . methocarbamol (ROBAXIN) 500 MG tablet Take 250 mg by mouth every 8 (eight) hours as needed for muscle spasms.  . MULTAQ 400 MG tablet TAKE 1 TABLET BY MOUTH TWICE DAILY  . polyethylene glycol (MIRALAX / GLYCOLAX) packet Take 17 g by mouth daily as needed.   . ranitidine (ZANTAC) 150 MG capsule Take 150 mg by mouth every evening.   . sodium chloride (OCEAN) 0.65 % SOLN nasal spray Place 2 sprays into both nostrils 2 (two) times daily as needed for congestion.  .  [DISCONTINUED] ezetimibe (ZETIA) 10 MG tablet Take 10 mg by mouth at bedtime.   No facility-administered encounter medications on file as of 09/22/2017.     Review of Systems  Unable to perform ROS: Dementia    Immunization History  Administered Date(s) Administered  . Influenza Inj Mdck Quad Pf 11/06/2015  . Influenza-Unspecified 11/09/2012, 10/31/2014, 11/08/2016  . Pneumococcal Polysaccharide-23 03/23/2005, 04/07/2016  . Td 03/18/2004  . Tdap 07/01/2016  . Zoster 08/07/2007  . Zoster Recombinat (Shingrix) 02/21/2017   Pertinent  Health Maintenance Due  Topic Date Due  . PNA vac Low Risk Adult (2 of 2 - PCV13) 04/07/2017  . INFLUENZA VACCINE  08/18/2017  . HEMOGLOBIN A1C  03/21/2018  . FOOT EXAM  07/30/2018   Fall Risk  07/13/2017 07/06/2017 01/05/2017 08/25/2016 06/30/2016  Falls in the past year? No No No No No  Number falls in past yr: - - - - -  Injury with Fall? - - - - -  Risk for fall due to : - - - - -   Functional  Status Survey:    Vitals:   09/22/17 1507  BP: 119/71  Pulse: 62  Resp: 18  Temp: 97.9 F (36.6 C)  SpO2: 99%   There is no height or weight on file to calculate BMI.  Wt Readings from Last 3 Encounters:  09/22/17 104 lb 6.4 oz (47.4 kg)  07/13/17 119 lb (54 kg)  07/06/17 129 lb (58.5 kg)   Physical Exam  Constitutional: No distress.  HENT:  Head: Normocephalic and atraumatic.  Right Ear: External ear normal.  Left Ear: External ear normal.  Nose: Nose normal.  Mouth/Throat: Oropharynx is clear and moist. No oropharyngeal exudate.  Eyes: Pupils are equal, round, and reactive to light. Conjunctivae are normal. Right eye exhibits no discharge. Left eye exhibits no discharge.  Neck: No JVD present. No thyromegaly present.  Cardiovascular: Normal rate and regular rhythm.  No murmur heard. Pulmonary/Chest: Effort normal and breath sounds normal. No respiratory distress. She has no wheezes.  Abdominal: Soft. Bowel sounds are normal. She exhibits  no distension.  Lymphadenopathy:    She has no cervical adenopathy.  Neurological: She is alert.  Oriented x 2, able to f/c Right facial droop and right sided weakness  Skin: Skin is warm and dry. She is not diaphoretic.  Psychiatric: She has a normal mood and affect.  Has calmed down and much more pleasant  Nursing note and vitals reviewed.   Labs reviewed: Recent Labs    07/12/17 0300 07/29/17 09/20/17  NA 140 136* 140  K 4.3 4.5 4.6  BUN 16 20 13   CREATININE 1.2* 1.1 1.1   Recent Labs    07/12/17 0300 07/29/17 09/20/17  AST 120* 121* 11*  ALT 48* 48* 12  ALKPHOS 298* 541* 93   Recent Labs    07/12/17 0300 07/29/17 09/20/17  WBC 14.1  --  11.3  HGB 13.8 14.7 13.4  HCT 44 46 40  PLT 337 369 340   Lab Results  Component Value Date   TSH 2.21 07/12/2017   Lab Results  Component Value Date   HGBA1C 6.3 09/20/2017   Lab Results  Component Value Date   CHOL 135 04/05/2016   HDL 48 04/05/2016   LDLCALC 60 04/05/2016   TRIG 137 04/05/2016   CHOLHDL 2.9 05/09/2014    Significant Diagnostic Results in last 30 days:  Ct Lumbar Spine Wo Contrast  Result Date: 06/23/2017 CLINICAL DATA:  Fall 06/14/2017.  Back pain. EXAM: CT LUMBAR SPINE WITHOUT CONTRAST TECHNIQUE: Multidetector CT imaging of the lumbar spine was performed without intravenous contrast administration. Multiplanar CT image reconstructions were also generated. COMPARISON:  CT abdomen and pelvis 03/17/2004. Whole-body bone scan 10/28/2010. FINDINGS: Segmentation: 5 non rib-bearing lumbar type vertebral bodies are present. The lowest fully formed vertebral body is L5. Alignment: The moderate scoliosis is convex to the right at L3. A grade 1 anterolisthesis is present at L4-5 and L5-S1. There is slight retrolisthesis at L2-3 and L3-4. Vertebrae: Chronic sclerotic changes are present on the left at L2-3, L3-4, and L4-5 associated with scoliosis. Right-sided sclerotic changes are present at L5-S1. Vertebral body  heights are maintained. No acute fracture or traumatic subluxation is present. Paraspinal and other soft tissues: Atherosclerotic calcifications are present in the aorta and branch vessels without aneurysm. Dense calcifications suggest high-grade stenosis or occlusion of the right proximal common iliac artery. Nonobstructing stones are present in the kidneys bilaterally. Ureters are within normal limits. Gallbladder is dilated, within normal limits. Disc levels: L1-2: Mild facet hypertrophy is  present. There is no significant stenosis. L2-3: A broad-based disc protrusion is present. Facet hypertrophy is worse on the left. This results in moderate to severe central canal stenosis with left greater than right subarticular narrowing. Severe left and mild right foraminal stenosis is present. L3-4: Moderate central canal stenosis is worse on the left secondary to leftward disc protrusion. Moderate facet hypertrophy and spurring is worse on the left. Moderate left and mild right subarticular stenosis is present. There is moderate foraminal stenosis bilaterally. L4-5: Severe central canal stenosis is secondary to uncovering of a broad-based disc protrusion and advanced bilateral facet hypertrophy. Moderate foraminal stenosis is worse on the left. L5-S1: Moderate subarticular narrowing is secondary to uncovering of a broad-based disc protrusion. This is worse on the right. Moderate foraminal stenosis is present bilaterally. IMPRESSION: 1. Moderate rightward scoliosis is centered at L3-4. 2. Severe central canal stenosis at L2-3 with left greater than right subarticular narrowing. 3. Severe left and mild right foraminal narrowing at L2-3. 4. Moderate left and mild right subarticular narrowing at L3-4 with moderate foraminal narrowing bilaterally. 5. Severe central canal narrowing at L4-5. 6. Moderate foraminal stenosis at L4-5 is worse on the left. 7. Moderate right subarticular and bilateral foraminal stenosis at L5-S1.  Electronically Signed   By: Marin Roberts M.D.   On: 06/23/2017 17:02   Ct Pelvis Wo Contrast  Result Date: 06/24/2017 CLINICAL DATA:  Low back and pelvic pain after fall last week. EXAM: CT PELVIS WITHOUT CONTRAST TECHNIQUE: Multidetector CT imaging of the pelvis was performed following the standard protocol without intravenous contrast. COMPARISON:  CT scan of February 26, 2004. FINDINGS: Urinary Tract:  No abnormality visualized. Bowel:  Unremarkable visualized pelvic bowel loops. Vascular/Lymphatic: Atherosclerosis of visualized abdominal aorta and iliac arteries is noted. No adenopathy is noted. Reproductive: Status post hysterectomy. No adnexal abnormality is noted. Other: No abnormal fluid collection is noted. No significant hernia is noted. Musculoskeletal: Multilevel degenerative disc disease is noted in the visualized lower lumbar spine. Hips are unremarkable. No acute osseous abnormality is noted. IMPRESSION: Multilevel degenerative disc disease in the lower lumbar spine. No acute osseous abnormality is noted. No acute abnormality seen in the pelvis. Aortic Atherosclerosis (ICD10-I70.0). Electronically Signed   By: Lupita Raider, M.D.   On: 06/24/2017 08:31    Assessment/Plan  1. Vascular dementia with behavior disturbance Severe agitation and verbal aggression aggravated by the recent move to skilled care.  Try Ativan 0.25 mg qam scheduled x 7 days while she is adjusting. She also has a prn order and the staff can substitute with gel if needed. Consider low dose seroquel or zoloft if she does not improve after she has had time to adjust.  2. Hemiparesis affecting right side as late effect of stroke (HCC) Mild weakness on the right side with facial droop  3. Type 2 diabetes mellitus with complication, without long-term current use of insulin (HCC) Controlled without medications Lab Results  Component Value Date   HGBA1C 6.3 09/20/2017    4. Acquired hypothyroidism Continue  Synthroid 75 mcg qd Lab Results  Component Value Date   TSH 2.21 07/12/2017    5. Essential hypertension, benign Controlled   6. Paroxysmal atrial fibrillation (HCC) Rate controlled with multaq Continue baby aspirin Not on anticoagulation due to fall risk, hx of cerebral hemorrhage and goals of care  7. Weight loss Significant weight loss due to progressive dementia and recent gall bladder issues which has resolved Continue nutritional supplements Goals of care are comfort  based with no hospitalizations or feeding tubes  8. Physical deconditioning She is not a good candidate to work with PT due to her agitation  I have asked the staff to try to get her out of bed and possibly walking short distances.   Family/ staff Communication: staff  Labs/tests ordered: NA

## 2017-09-24 ENCOUNTER — Encounter: Payer: Self-pay | Admitting: Adult Health

## 2017-09-27 ENCOUNTER — Encounter: Payer: Self-pay | Admitting: Internal Medicine

## 2017-09-27 ENCOUNTER — Non-Acute Institutional Stay (SKILLED_NURSING_FACILITY): Payer: Medicare Other | Admitting: Internal Medicine

## 2017-09-27 DIAGNOSIS — R410 Disorientation, unspecified: Secondary | ICD-10-CM | POA: Diagnosis not present

## 2017-09-27 DIAGNOSIS — E440 Moderate protein-calorie malnutrition: Secondary | ICD-10-CM | POA: Diagnosis not present

## 2017-09-27 DIAGNOSIS — F01518 Vascular dementia, unspecified severity, with other behavioral disturbance: Secondary | ICD-10-CM

## 2017-09-27 DIAGNOSIS — F0151 Vascular dementia with behavioral disturbance: Secondary | ICD-10-CM | POA: Diagnosis not present
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
## 2017-10-04 DIAGNOSIS — F0281 Dementia in other diseases classified elsewhere with behavioral disturbance: Secondary | ICD-10-CM | POA: Diagnosis not present

## 2017-10-12 DIAGNOSIS — D649 Anemia, unspecified: Secondary | ICD-10-CM | POA: Diagnosis not present

## 2017-10-12 DIAGNOSIS — I10 Essential (primary) hypertension: Secondary | ICD-10-CM | POA: Diagnosis not present

## 2017-10-12 LAB — CBC AND DIFFERENTIAL
HCT: 35 — AB (ref 36–46)
Hemoglobin: 11.6 — AB (ref 12.0–16.0)
PLATELETS: 301 (ref 150–399)
WBC: 7.7

## 2017-10-13 ENCOUNTER — Encounter: Payer: Self-pay | Admitting: Adult Health

## 2017-10-13 ENCOUNTER — Non-Acute Institutional Stay (SKILLED_NURSING_FACILITY): Payer: Medicare Other | Admitting: Adult Health

## 2017-10-13 DIAGNOSIS — R410 Disorientation, unspecified: Secondary | ICD-10-CM

## 2017-10-13 DIAGNOSIS — L97521 Non-pressure chronic ulcer of other part of left foot limited to breakdown of skin: Secondary | ICD-10-CM

## 2017-10-13 NOTE — Progress Notes (Signed)
Location:  Medical illustrator of Service:  SNF (31) Provider:   [name], ANP [facility] [phone]   Kermit Balo, DO  Patient Care Team: Kermit Balo, DO as PCP - General (Geriatric Medicine) Early, Kristen Loader, MD as Consulting Physician (Vascular Surgery) Ranelle Oyster, MD as Consulting Physician (Physical Medicine and Rehabilitation) Micki Riley, MD as Consulting Physician (Neurology) Hillis Range, MD as Consulting Physician (Cardiology) Mckinley Jewel, MD as Consulting Physician (Ophthalmology) Salvatore Marvel, MD as Consulting Physician (Orthopedic Surgery) Marcine Matar, MD as Consulting Physician (Urology)  Extended Emergency Contact Information Primary Emergency Contact: Shirlean Kelly Address: [address]          [place], [place] Home Phone: [phone] Relation: Son Secondary Emergency Contact: Despina [place] Mobile Phone: [phone] Relation: Other  Code Status:  DNR Goals of care: Advanced Directive information Advanced Directives 09/27/2017  Does Patient Have a Medical Advance Directive? Yes  Type of Advance Directive Out of facility DNR (pink MOST or yellow form);Healthcare Power of Attorney  Does patient want to make changes to medical advance directive? No - Patient declined  Copy of Healthcare Power of Attorney in Chart? Yes  Would patient like information on creating a medical advance directive? -  Pre-existing out of facility DNR order (yellow form or pink MOST form) Yellow form placed in chart (order not valid for inpatient use)     Chief Complaint  Patient presents with  . Acute Visit    delirium    HPI:  Pt is a 84 y.o. female seen today for an acute visit for delirium. She recently moved to skilled care from AL due to functional and cognitive decline associated with vascular dementia. She has been agitated per reports of the  DON and nursing staff with behaviors such as hitting, kicking, scratching, and yelling. She is difficult to redirect and easily angered. Psych was consulted and started on Equetro and Seroquel XR one week ago but it has been difficult to get her to swallow her pills so the Seroquel was discontinued and Risperdal was started at 0.5 mg qhs disintegrating tablet one day ago 9/26.  Her night was difficult and she was given prn Ativan 0.5 mg two times during the night with no relief. Last weekend the nurse gave robaxin for suspected back pain which seemed to help at the time per the nsg notes.  She also has a non healing ulceration to her left second toe with mild erythema and yellow drainage. There are no reports of pain or tenderness or fever.  For my visit she is in bed stating when do I need to get up over and over with a furrowed brow. She had difficulty answer questions and was easily frustrated.    Past Medical History:  Diagnosis Date  . Aortic sclerosis   . Atrial fibrillation (HCC)   . Benign hypertensive heart disease without heart failure   . Benign hypertensive kidney disease with chronic kidney disease stage I through stage IV, or unspecified(403.10)   . Carpal tunnel syndrome   . Chronic anemia   . Chronic anemia   . Chronic renal disease, stage III (HCC)   . Coronary atherosclerosis of native coronary artery   . Diabetes mellitus with chronic kidney disease (HCC)   . Essential hypertension, benign   . H/O echocardiogram 07/19/09   EF = 60-65%  . Hyperglycemia    Mild  . Hyperlipidemia  Tolerating medication well as of 11/27/12  . Hypothyroidism   . Microalbuminuria 02/2010  . NSTEMI (non-ST elevated myocardial infarction) (HCC) 09/21/09   BM stent RCA  . Osteoarthritis   . Osteoporosis, senile   . Postsurgical percutaneous transluminal coronary angioplasty status   . Pure hypercholesterolemia   . Sinoatrial node dysfunction (HCC)   . Spinal stenosis   . Spinal stenosis of  lumbar region   . Tachy-brady syndrome (HCC)    s/p PPM, battery change 08/01/09  . Vitamin D deficiency 10/2011   Past Surgical History:  Procedure Laterality Date  . CARPAL TUNNEL RELEASE Right 02/07/09  . CORONARY ANGIOPLASTY WITH STENT PLACEMENT  09/21/09   By Dr. Amil Amen. ACS, NSTEMI - BM stent RCA  . OOPHORECTOMY     With hysterectomy. One ovary removed-fibroids.  Marland Kitchen PACEMAKER GENERATOR CHANGE  08/04/09   By Dr. Amil Amen. New pacing generator is a Medtronic EnRhythm, model D1939726, serial [id number]  . PACEMAKER INSERTION  2004   Gen change 08/04/09 by Dr. Amil Amen. New pacing generator is a Medtronic EnRhythm, model D1939726, serial [id number]  . PERCUTANEOUS CORONARY STENT INTERVENTION (PCI-S)  05/08/02   (3 tandem Cypher stent), proximal and mid LAD  . VESICOVAGINAL FISTULA CLOSURE W/ TAH      Allergies  Allergen Reactions  . Fosamax [Alendronate Sodium]   . Other Other (See Comments)    Allergic to crab meat, but no shellfish allergy (can eat lobster, shrimp, etc.)  Crab meat causes angioedema of the throat.  Marland Kitchen Penicillins   . Sulfa Antibiotics     Outpatient Encounter Medications as of 10/13/2017  Medication Sig  . Carbamazepine (EQUETRO) 100 MG CP12 12 hr capsule Take 100 mg by mouth 2 (two) times daily.  . risperiDONE (RISPERDAL M-TABS) 0.5 MG disintegrating tablet Take 0.5 mg by mouth at bedtime.  Marland Kitchen aspirin EC 81 MG tablet Take 81 mg by mouth daily.  . bisacodyl (DULCOLAX) 10 MG suppository Place 10 mg rectally as needed for moderate constipation.  . carboxymethylcellulose (REFRESH PLUS) 0.5 % SOLN Place 1 drop into both eyes 2 (two) times daily as needed.  Marland Kitchen CARTIA XT 120 MG 24 hr capsule Take 120 mg by mouth.   . levothyroxine (SYNTHROID, LEVOTHROID) 75 MCG tablet Take 37.5 mcg by mouth daily before breakfast.  . LORazepam (ATIVAN) 0.5 MG tablet Take 0.5 mg by mouth 3 (three) times daily as needed (agitation).   . LORazepam (ATIVAN) 2 MG/ML concentrated solution Take  0.5 mg by mouth 3 (three) times daily as needed. GEL if unable to take the pill   . methocarbamol (ROBAXIN) 500 MG tablet Take 250 mg by mouth every 8 (eight) hours as needed for muscle spasms.  . MULTAQ 400 MG tablet TAKE 1 TABLET BY MOUTH TWICE DAILY  . ondansetron (ZOFRAN) 4 MG tablet Take 4 mg by mouth every 8 (eight) hours as needed for nausea or vomiting.  . polyethylene glycol (MIRALAX / GLYCOLAX) packet Take 17 g by mouth daily as needed.   Marland Kitchen QUEtiapine (SEROQUEL) 25 MG tablet Take 25 mg by mouth at bedtime.  . ranitidine (ZANTAC) 150 MG capsule Take 150 mg by mouth every evening.   . sodium chloride (OCEAN) 0.65 % SOLN nasal spray Place 2 sprays into both nostrils 2 (two) times daily as needed for congestion.  . [DISCONTINUED] LORazepam (ATIVAN) 0.5 MG tablet Take 0.25 mg by mouth every morning.   No facility-administered encounter medications on file as of 10/13/2017.     Review of  Systems  Unable to perform ROS: Dementia    Immunization History  Administered Date(s) Administered  . Influenza Inj Mdck Quad Pf 11/06/2015  . Influenza-Unspecified 11/09/2012, 10/31/2014, 11/08/2016  . Pneumococcal Polysaccharide-23 03/23/2005, 04/07/2016  . Td 03/18/2004  . Tdap 07/01/2016  . Zoster 08/07/2007  . Zoster Recombinat (Shingrix) 02/21/2017   Pertinent  Health Maintenance Due  Topic Date Due  . PNA vac Low Risk Adult (2 of 2 - PCV13) 04/07/2017  . INFLUENZA VACCINE  08/18/2017  . HEMOGLOBIN A1C  03/21/2018  . FOOT EXAM  07/30/2018   Fall Risk  07/13/2017 07/06/2017 01/05/2017 08/25/2016 06/30/2016  Falls in the past year? No No No No No  Number falls in past yr: - - - - -  Injury with Fall? - - - - -  Risk for fall due to : - - - - -   Functional Status Survey:    There were no vitals filed for this visit. There is no height or weight on file to calculate BMI. Physical Exam  Constitutional: No distress.  HENT:  Head: Normocephalic and atraumatic.  Eyes: Pupils are equal,  round, and reactive to light. Conjunctivae are normal. Right eye exhibits no discharge. Left eye exhibits no discharge.  Neck: No JVD present.  Cardiovascular: Normal rate.  No murmur heard. irregular  Pulmonary/Chest: Effort normal and breath sounds normal. No respiratory distress. She has no wheezes.  Abdominal: Soft. Bowel sounds are normal. She exhibits no distension. There is no tenderness.  Neurological: She is alert.  Right hemiparesis. Oriented to self only  Skin: Skin is warm and dry. She is not diaphoretic. There is erythema (right second toe with ulceration in between with the 2nd toe with yellow base and surrounding erythema. Small amt of yellow drainage noted but no warmth or tenderness).  Psychiatric: She has a normal mood and affect.  Nursing note and vitals reviewed.   Labs reviewed: Recent Labs    07/12/17 0300 07/29/17 09/20/17  NA 140 136* 140  K 4.3 4.5 4.6  BUN 16 20 13   CREATININE 1.2* 1.1 1.1   Recent Labs    07/12/17 0300 07/29/17 09/20/17  AST 120* 121* 11*  ALT 48* 48* 12  ALKPHOS 298* 541* 93   Recent Labs    07/12/17 0300 07/29/17 09/20/17  WBC 14.1  --  11.3  HGB 13.8 14.7 13.4  HCT 44 46 40  PLT 337 369 340   Lab Results  Component Value Date   TSH 2.21 07/12/2017   Lab Results  Component Value Date   HGBA1C 6.3 09/20/2017   Lab Results  Component Value Date   CHOL 135 04/05/2016   HDL 48 04/05/2016   LDLCALC 60 04/05/2016   TRIG 137 04/05/2016   CHOLHDL 2.9 05/09/2014    Significant Diagnostic Results in last 30 days:  No results found.  Assessment/Plan  1. Delirium with dementia There is no improvement as of yet but the Risperdal needs more time to work. We called Dr. Donell Beers and agreed to try Klonopin to see if it helps with her agitation in the interim while we are waiting on the Risperdal and Equetro to work. D/C ativan and give Klonopin wafer 1mg  x 1 now and 0.5 mg starting in the am BID. Dr Alisia Ferrari also recommended a UA  C and S. Currently she has no urinary symptoms.   2. Toe ulcer, left, limited to breakdown of skin (HCC) Non healing with surrounding redness with a hx of diabetes  Will order bactroban BID x 7 days, if no improvement she may need an oral antibiotic but so far it has been difficult for her to get pills down due to her agitation. This is mild and nature and does not seem to be contributing to her agitation. May use tylenol 650 mg TID prn pain as well.    Family/ staff Communication: resident and staff  Labs/tests ordered:  UA C and S

## 2017-10-15 DIAGNOSIS — N390 Urinary tract infection, site not specified: Secondary | ICD-10-CM | POA: Diagnosis not present

## 2017-10-15 DIAGNOSIS — Z79899 Other long term (current) drug therapy: Secondary | ICD-10-CM | POA: Diagnosis not present

## 2017-10-15 DIAGNOSIS — R319 Hematuria, unspecified: Secondary | ICD-10-CM | POA: Diagnosis not present

## 2017-10-17 ENCOUNTER — Non-Acute Institutional Stay (SKILLED_NURSING_FACILITY): Payer: Medicare Other | Admitting: Adult Health

## 2017-10-17 ENCOUNTER — Encounter: Payer: Self-pay | Admitting: Adult Health

## 2017-10-17 DIAGNOSIS — N3000 Acute cystitis without hematuria: Secondary | ICD-10-CM

## 2017-10-17 DIAGNOSIS — L97521 Non-pressure chronic ulcer of other part of left foot limited to breakdown of skin: Secondary | ICD-10-CM | POA: Diagnosis not present

## 2017-10-17 DIAGNOSIS — R410 Disorientation, unspecified: Secondary | ICD-10-CM

## 2017-10-17 NOTE — Progress Notes (Signed)
Location:  Medical illustrator of Service:  SNF (31) Provider:   [name], ANP [facility] [phone]  Kermit Balo, DO  Patient Care Team: Kermit Balo, DO as PCP - General (Geriatric Medicine) Early, Kristen Loader, MD as Consulting Physician (Vascular Surgery) Ranelle Oyster, MD as Consulting Physician (Physical Medicine and Rehabilitation) Micki Riley, MD as Consulting Physician (Neurology) Hillis Range, MD as Consulting Physician (Cardiology) Mckinley Jewel, MD as Consulting Physician (Ophthalmology) Salvatore Marvel, MD as Consulting Physician (Orthopedic Surgery) Marcine Matar, MD as Consulting Physician (Urology)  Extended Emergency Contact Information Primary Emergency Contact: Shirlean Kelly Address: [address]          [place], [place] Home Phone: [phone] Relation: Son Secondary Emergency Contact: Despina [place] Mobile Phone: [phone] Relation: Other  Code Status:  DNR Goals of care: Advanced Directive information Advanced Directives 09/27/2017  Does Patient Have a Medical Advance Directive? Yes  Type of Advance Directive Out of facility DNR (pink MOST or yellow form);Healthcare Power of Attorney  Does patient want to make changes to medical advance directive? No - Patient declined  Copy of Healthcare Power of Attorney in Chart? Yes  Would patient like information on creating a medical advance directive? -  Pre-existing out of facility DNR order (yellow form or pink MOST form) Yellow form placed in chart (order not valid for inpatient use)     Chief Complaint  Patient presents with  . Acute Visit    f/u urine culture, wound    HPI:  Pt is a 84 y.o. female seen today for an acute visit for follow up regarding a urine culture and a toe wound.  She has vascular dementia with behaviors and is followed by Dr. Donell Beers. Her behaviors have  significantly improved with the addition of clonazepam. She is less physically and verbally aggressive and appears calmer. Bactroban was prescribed on 9/26 due to a toe ulcer with erythema and drainage and is improving per the staff.  A urine was collected as suggested by Dr. Donell Beers due to her delirium which showed 3+ bacteria and 3+ leuk esterase, high spec gravity, and grew 100,000 colonies of e coli. She has not had a fever or urinary symptoms. The staff have had a hard time getting her vital signs due to her agitation.    Past Medical History:  Diagnosis Date  . Aortic sclerosis   . Atrial fibrillation (HCC)   . Benign hypertensive heart disease without heart failure   . Benign hypertensive kidney disease with chronic kidney disease stage I through stage IV, or unspecified(403.10)   . Carpal tunnel syndrome   . Chronic anemia   . Chronic anemia   . Chronic renal disease, stage III (HCC)   . Coronary atherosclerosis of native coronary artery   . Diabetes mellitus with chronic kidney disease (HCC)   . Essential hypertension, benign   . H/O echocardiogram 07/19/09   EF = 60-65%  . Hyperglycemia    Mild  . Hyperlipidemia    Tolerating medication well as of 11/27/12  . Hypothyroidism   . Microalbuminuria 02/2010  . NSTEMI (non-ST elevated myocardial infarction) (HCC) 09/21/09   BM stent RCA  . Osteoarthritis   . Osteoporosis, senile   . Postsurgical percutaneous transluminal coronary angioplasty status   . Pure hypercholesterolemia   . Sinoatrial node dysfunction (HCC)   . Spinal stenosis   . Spinal stenosis of lumbar region   .  Tachy-brady syndrome (HCC)    s/p PPM, battery change 08/01/09  . Vitamin D deficiency 10/2011   Past Surgical History:  Procedure Laterality Date  . CARPAL TUNNEL RELEASE Right 02/07/09  . CORONARY ANGIOPLASTY WITH STENT PLACEMENT  09/21/09   By Dr. Amil Amen. ACS, NSTEMI - BM stent RCA  . OOPHORECTOMY     With hysterectomy. One ovary removed-fibroids.  Marland Kitchen  PACEMAKER GENERATOR CHANGE  08/04/09   By Dr. Amil Amen. New pacing generator is a Medtronic EnRhythm, model D1939726, serial [id number]  . PACEMAKER INSERTION  2004   Gen change 08/04/09 by Dr. Amil Amen. New pacing generator is a Medtronic EnRhythm, model D1939726, serial [id number]  . PERCUTANEOUS CORONARY STENT INTERVENTION (PCI-S)  05/08/02   (3 tandem Cypher stent), proximal and mid LAD  . VESICOVAGINAL FISTULA CLOSURE W/ TAH      Allergies  Allergen Reactions  . Fosamax [Alendronate Sodium]   . Other Other (See Comments)    Allergic to crab meat, but no shellfish allergy (can eat lobster, shrimp, etc.)  Crab meat causes angioedema of the throat.  Marland Kitchen Penicillins   . Sulfa Antibiotics     Outpatient Encounter Medications as of 10/17/2017  Medication Sig  . clonazePAM (KLONOPIN) 1 MG disintegrating tablet Take 0.5 mg by mouth 2 (two) times daily.  Marland Kitchen aspirin EC 81 MG tablet Take 81 mg by mouth daily.  . bisacodyl (DULCOLAX) 10 MG suppository Place 10 mg rectally as needed for moderate constipation.  . Carbamazepine (EQUETRO) 100 MG CP12 12 hr capsule Take 100 mg by mouth 2 (two) times daily.  . carboxymethylcellulose (REFRESH PLUS) 0.5 % SOLN Place 1 drop into both eyes 2 (two) times daily as needed.  Marland Kitchen CARTIA XT 120 MG 24 hr capsule Take 120 mg by mouth.   . levothyroxine (SYNTHROID, LEVOTHROID) 75 MCG tablet Take 37.5 mcg by mouth daily before breakfast.  . methocarbamol (ROBAXIN) 500 MG tablet Take 250 mg by mouth every 8 (eight) hours as needed for muscle spasms.  . MULTAQ 400 MG tablet TAKE 1 TABLET BY MOUTH TWICE DAILY  . ondansetron (ZOFRAN) 4 MG tablet Take 4 mg by mouth every 8 (eight) hours as needed for nausea or vomiting.  . polyethylene glycol (MIRALAX / GLYCOLAX) packet Take 17 g by mouth daily as needed.   . ranitidine (ZANTAC) 150 MG capsule Take 150 mg by mouth every evening.   . risperiDONE (RISPERDAL M-TABS) 0.5 MG disintegrating tablet Take 0.5 mg by mouth at bedtime.    . sodium chloride (OCEAN) 0.65 % SOLN nasal spray Place 2 sprays into both nostrils 2 (two) times daily as needed for congestion.  . [DISCONTINUED] LORazepam (ATIVAN) 0.5 MG tablet Take 0.5 mg by mouth 3 (three) times daily as needed (agitation).   . [DISCONTINUED] LORazepam (ATIVAN) 2 MG/ML concentrated solution Take 0.5 mg by mouth 3 (three) times daily as needed. GEL if unable to take the pill   . [DISCONTINUED] QUEtiapine (SEROQUEL) 25 MG tablet Take 25 mg by mouth at bedtime.   No facility-administered encounter medications on file as of 10/17/2017.     Review of Systems  Unable to perform ROS: Dementia    Immunization History  Administered Date(s) Administered  . Influenza Inj Mdck Quad Pf 11/06/2015  . Influenza-Unspecified 11/09/2012, 10/31/2014, 11/08/2016  . Pneumococcal Polysaccharide-23 03/23/2005, 04/07/2016  . Td 03/18/2004  . Tdap 07/01/2016  . Zoster 08/07/2007  . Zoster Recombinat (Shingrix) 02/21/2017   Pertinent  Health Maintenance Due  Topic Date Due  .  PNA vac Low Risk Adult (2 of 2 - PCV13) 04/07/2017  . INFLUENZA VACCINE  08/18/2017  . HEMOGLOBIN A1C  03/21/2018  . FOOT EXAM  07/30/2018   Fall Risk  07/13/2017 07/06/2017 01/05/2017 08/25/2016 06/30/2016  Falls in the past year? No No No No No  Number falls in past yr: - - - - -  Injury with Fall? - - - - -  Risk for fall due to : - - - - -   Functional Status Survey:    There were no vitals filed for this visit. There is no height or weight on file to calculate BMI. Physical Exam  Constitutional: No distress.  HENT:  Head: Normocephalic and atraumatic.  Neck: No JVD present.  Cardiovascular: Normal rate.  No murmur heard. irreg  Pulmonary/Chest: Effort normal and breath sounds normal. No respiratory distress. She has no wheezes.  Abdominal: Soft. Bowel sounds are normal.  Neurological: She is alert.  Oriented to self. Right hemiparesis. Slurred speech  Skin: Skin is warm and dry. She is not  diaphoretic.  Left second toe with ulceration 100% pink tissue with surrounding erythema to the proximal portion of the second toe. . No drainage to tenderness   Psychiatric:  calmer    Labs reviewed: Recent Labs    07/12/17 0300 07/29/17 09/20/17  NA 140 136* 140  K 4.3 4.5 4.6  BUN 16 20 13   CREATININE 1.2* 1.1 1.1   Recent Labs    07/12/17 0300 07/29/17 09/20/17  AST 120* 121* 11*  ALT 48* 48* 12  ALKPHOS 298* 541* 93   Recent Labs    07/12/17 0300 07/29/17 09/20/17  WBC 14.1  --  11.3  HGB 13.8 14.7 13.4  HCT 44 46 40  PLT 337 369 340   Lab Results  Component Value Date   TSH 2.21 07/12/2017   Lab Results  Component Value Date   HGBA1C 6.3 09/20/2017   Lab Results  Component Value Date   CHOL 135 04/05/2016   HDL 48 04/05/2016   LDLCALC 60 04/05/2016   TRIG 137 04/05/2016   CHOLHDL 2.9 05/09/2014    Significant Diagnostic Results in last 30 days:  No results found.  Assessment/Plan  1. Acute cystitis without hematuria Cipro 500 mg BID x 7 days with Florastor 1 cap BID x 7 days Repeat UA C and S 2 days after antibiotic complete per family request  2. Skin ulcer of toe of left foot, limited to breakdown of skin (HCC) Improved Continue bactroban ointment. Recommend that she wears socks with her shoes and a dry dressing to cover the wound  3. Delirium with dementia Improved with the addition of clonzepam.  Follow up w/Dr. Alisia Ferrari as indicated  Family/ staff Communication: discussed with her son Dr. Newell Coral Labs/tests ordered:  Repeat urine

## 2017-10-20 DIAGNOSIS — R278 Other lack of coordination: Secondary | ICD-10-CM | POA: Diagnosis not present

## 2017-10-20 DIAGNOSIS — M48061 Spinal stenosis, lumbar region without neurogenic claudication: Secondary | ICD-10-CM | POA: Diagnosis not present

## 2017-10-20 DIAGNOSIS — R293 Abnormal posture: Secondary | ICD-10-CM | POA: Diagnosis not present

## 2017-10-20 DIAGNOSIS — F0151 Vascular dementia with behavioral disturbance: Secondary | ICD-10-CM | POA: Diagnosis not present

## 2017-10-20 DIAGNOSIS — I69351 Hemiplegia and hemiparesis following cerebral infarction affecting right dominant side: Secondary | ICD-10-CM | POA: Diagnosis not present

## 2017-10-24 ENCOUNTER — Non-Acute Institutional Stay (SKILLED_NURSING_FACILITY): Payer: Medicare Other | Admitting: Adult Health

## 2017-10-24 ENCOUNTER — Encounter: Payer: Self-pay | Admitting: Adult Health

## 2017-10-24 DIAGNOSIS — E118 Type 2 diabetes mellitus with unspecified complications: Secondary | ICD-10-CM | POA: Diagnosis not present

## 2017-10-24 DIAGNOSIS — I480 Paroxysmal atrial fibrillation: Secondary | ICD-10-CM | POA: Diagnosis not present

## 2017-10-24 DIAGNOSIS — E039 Hypothyroidism, unspecified: Secondary | ICD-10-CM | POA: Diagnosis not present

## 2017-10-24 DIAGNOSIS — L97521 Non-pressure chronic ulcer of other part of left foot limited to breakdown of skin: Secondary | ICD-10-CM

## 2017-10-24 DIAGNOSIS — F0151 Vascular dementia with behavioral disturbance: Secondary | ICD-10-CM | POA: Diagnosis not present

## 2017-10-24 DIAGNOSIS — H903 Sensorineural hearing loss, bilateral: Secondary | ICD-10-CM | POA: Diagnosis not present

## 2017-10-24 DIAGNOSIS — R634 Abnormal weight loss: Secondary | ICD-10-CM

## 2017-10-24 DIAGNOSIS — F01518 Vascular dementia, unspecified severity, with other behavioral disturbance: Secondary | ICD-10-CM

## 2017-10-24 NOTE — Progress Notes (Signed)
Location:  Medical illustrator of Service:  SNF (31) Provider:   [name], ANP [facility] [phone]   Kermit Balo, DO  Patient Care Team: Kermit Balo, DO as PCP - General (Geriatric Medicine) Early, Kristen Loader, MD as Consulting Physician (Vascular Surgery) Ranelle Oyster, MD as Consulting Physician (Physical Medicine and Rehabilitation) Micki Riley, MD as Consulting Physician (Neurology) Hillis Range, MD as Consulting Physician (Cardiology) Mckinley Jewel, MD as Consulting Physician (Ophthalmology) Salvatore Marvel, MD as Consulting Physician (Orthopedic Surgery) Marcine Matar, MD as Consulting Physician (Urology)  Extended Emergency Contact Information Primary Emergency Contact: Shirlean Kelly Address: [address]          [place], [place] Home Phone: [phone] Relation: Son Secondary Emergency Contact: Despina [place] Mobile Phone: [phone] Relation: Other  Code Status:  DNR Goals of care: Advanced Directive information Advanced Directives 09/27/2017  Does Patient Have a Medical Advance Directive? Yes  Type of Advance Directive Out of facility DNR (pink MOST or yellow form);Healthcare Power of Attorney  Does patient want to make changes to medical advance directive? No - Patient declined  Copy of Healthcare Power of Attorney in Chart? Yes  Would patient like information on creating a medical advance directive? -  Pre-existing out of facility DNR order (yellow form or pink MOST form) Yellow form placed in chart (order not valid for inpatient use)     Chief Complaint  Patient presents with  . Medical Management of Chronic Issues    HPI:  Pt is a 84 y.o. female seen today for medical management of chronic diseases.   She moved to skilled care in Sept of 2019 and has difficulty adjusting. She is followed by Dr. Jeannett Senior due to vascular dementia with  behaviors. She has been losing weight, no eating, and not swallowing medications. This week there has been improvement in her mood with less yelling and attempts to get up without help. She continues to call for help but it is much less frequently and in a muted tone. She completed a 7 day course of Cipro for UTI on 10/6.  Her left 2nd toe had a wound that was treated with Bactroban for cellulitis (and concurrently on the Cipro) and the redness and swelling have subsided. The staff are able to get her medications in her most of the time now but she continues with a low appetite and does no drink enough water. Her goals of care are comfort based but her son, Dr. Newell Coral, would like treatment for easily reversible issues.   Past Medical History:  Diagnosis Date  . Aortic sclerosis   . Atrial fibrillation (HCC)   . Benign hypertensive heart disease without heart failure   . Benign hypertensive kidney disease with chronic kidney disease stage I through stage IV, or unspecified(403.10)   . Carpal tunnel syndrome   . Chronic anemia   . Chronic anemia   . Chronic renal disease, stage III (HCC)   . Coronary atherosclerosis of native coronary artery   . Diabetes mellitus with chronic kidney disease (HCC)   . Essential hypertension, benign   . H/O echocardiogram 07/19/09   EF = 60-65%  . Hyperglycemia    Mild  . Hyperlipidemia    Tolerating medication well as of 11/27/12  . Hypothyroidism   . Microalbuminuria 02/2010  . NSTEMI (non-ST elevated myocardial infarction) (HCC) 09/21/09   BM stent RCA  . Osteoarthritis   .  Osteoporosis, senile   . Postsurgical percutaneous transluminal coronary angioplasty status   . Pure hypercholesterolemia   . Sinoatrial node dysfunction (HCC)   . Spinal stenosis   . Spinal stenosis of lumbar region   . Tachy-brady syndrome (HCC)    s/p PPM, battery change 08/01/09  . Vitamin D deficiency 10/2011   Past Surgical History:  Procedure Laterality Date  . CARPAL  TUNNEL RELEASE Right 02/07/09  . CORONARY ANGIOPLASTY WITH STENT PLACEMENT  09/21/09   By Dr. Amil Amen. ACS, NSTEMI - BM stent RCA  . OOPHORECTOMY     With hysterectomy. One ovary removed-fibroids.  Marland Kitchen PACEMAKER GENERATOR CHANGE  08/04/09   By Dr. Amil Amen. New pacing generator is a Medtronic EnRhythm, model D1939726, serial [id number]  . PACEMAKER INSERTION  2004   Gen change 08/04/09 by Dr. Amil Amen. New pacing generator is a Medtronic EnRhythm, model D1939726, serial [id number]  . PERCUTANEOUS CORONARY STENT INTERVENTION (PCI-S)  05/08/02   (3 tandem Cypher stent), proximal and mid LAD  . VESICOVAGINAL FISTULA CLOSURE W/ TAH      Allergies  Allergen Reactions  . Fosamax [Alendronate Sodium]   . Other Other (See Comments)    Allergic to crab meat, but no shellfish allergy (can eat lobster, shrimp, etc.)  Crab meat causes angioedema of the throat.  Marland Kitchen Penicillins   . Sulfa Antibiotics     Outpatient Encounter Medications as of 10/24/2017  Medication Sig  . aspirin EC 81 MG tablet Take 81 mg by mouth daily.  . Carbamazepine (EQUETRO) 100 MG CP12 12 hr capsule Take 100 mg by mouth 2 (two) times daily.  Marland Kitchen CARTIA XT 120 MG 24 hr capsule Take 120 mg by mouth.   . clonazePAM (KLONOPIN) 1 MG disintegrating tablet Take 0.5 mg by mouth 2 (two) times daily.  Marland Kitchen lactose free nutrition (BOOST) LIQD Take 237 mLs by mouth 2 (two) times daily between meals.  Marland Kitchen levothyroxine (SYNTHROID, LEVOTHROID) 75 MCG tablet Take 37.5 mcg by mouth daily before breakfast.  . methocarbamol (ROBAXIN) 500 MG tablet Take 250 mg by mouth every 8 (eight) hours as needed for muscle spasms.  . MULTAQ 400 MG tablet TAKE 1 TABLET BY MOUTH TWICE DAILY (Patient taking differently: 400 mg daily. )  . ondansetron (ZOFRAN) 4 MG tablet Take 4 mg by mouth every 8 (eight) hours as needed for nausea or vomiting.  . polyethylene glycol (MIRALAX / GLYCOLAX) packet Take 17 g by mouth daily as needed.   . risperiDONE (RISPERDAL M-TABS) 0.5 MG  disintegrating tablet Take 0.5 mg by mouth at bedtime.  . [DISCONTINUED] bisacodyl (DULCOLAX) 10 MG suppository Place 10 mg rectally as needed for moderate constipation.  . [DISCONTINUED] carboxymethylcellulose (REFRESH PLUS) 0.5 % SOLN Place 1 drop into both eyes 2 (two) times daily as needed.  . [DISCONTINUED] ondansetron (ZOFRAN) 4 MG tablet Take 4 mg by mouth every 8 (eight) hours as needed for nausea or vomiting.  . [DISCONTINUED] ranitidine (ZANTAC) 150 MG capsule Take 150 mg by mouth every evening.   . [DISCONTINUED] sodium chloride (OCEAN) 0.65 % SOLN nasal spray Place 2 sprays into both nostrils 2 (two) times daily as needed for congestion.   No facility-administered encounter medications on file as of 10/24/2017.     Review of Systems  Unable to perform ROS: Dementia    Immunization History  Administered Date(s) Administered  . Influenza Inj Mdck Quad Pf 11/06/2015  . Influenza-Unspecified 11/09/2012, 10/31/2014, 11/08/2016  . Pneumococcal Polysaccharide-23 03/23/2005, 04/07/2016  . Td 03/18/2004  .  Tdap 07/01/2016  . Zoster 08/07/2007  . Zoster Recombinat (Shingrix) 02/21/2017   Pertinent  Health Maintenance Due  Topic Date Due  . PNA vac Low Risk Adult (2 of 2 - PCV13) 04/07/2017  . INFLUENZA VACCINE  08/18/2017  . HEMOGLOBIN A1C  03/21/2018  . FOOT EXAM  07/30/2018   Fall Risk  07/13/2017 07/06/2017 01/05/2017 08/25/2016 06/30/2016  Falls in the past year? No No No No No  Number falls in past yr: - - - - -  Injury with Fall? - - - - -  Risk for fall due to : - - - - -   Functional Status Survey:    Vitals:   10/24/17 1434  Weight: 103 lb 14.4 oz (47.1 kg)   Body mass index is 23.29 kg/m. Physical Exam  HENT:  Head: Normocephalic and atraumatic.  Cardiovascular: Normal rate.  No murmur heard. Irreg, trace edema to both feet  Pulmonary/Chest: Effort normal and breath sounds normal.  Abdominal: Soft. Bowel sounds are normal.  Musculoskeletal: She exhibits no  tenderness.  Neurological: She is alert.  Right sided hemiparesis, not oriented and not able to f/c.  Skin: Skin is warm and dry.  Left second toe ulcer improved with no minimal redness surrounding the wound bed. No drainage or warmth.  Wound bed is 100% yellow tissue.     Labs reviewed: Recent Labs    07/12/17 0300 07/29/17 09/20/17  NA 140 136* 140  K 4.3 4.5 4.6  BUN 16 20 13   CREATININE 1.2* 1.1 1.1   Recent Labs    07/12/17 0300 07/29/17 09/20/17  AST 120* 121* 11*  ALT 48* 48* 12  ALKPHOS 298* 541* 93   Recent Labs    07/12/17 0300 07/29/17 09/20/17 10/12/17  WBC 14.1  --  11.3 7.7  HGB 13.8 14.7 13.4 11.6*  HCT 44 46 40 35*  PLT 337 369 340 301   Lab Results  Component Value Date   TSH 2.21 07/12/2017   Lab Results  Component Value Date   HGBA1C 6.3 09/20/2017   Lab Results  Component Value Date   CHOL 135 04/05/2016   HDL 48 04/05/2016   LDLCALC 60 04/05/2016   TRIG 137 04/05/2016   CHOLHDL 2.9 05/09/2014    Significant Diagnostic Results in last 30 days:  No results found.  Assessment/Plan . 1. Vascular dementia with behavior disturbance (HCC) Improvement noted in behaviors with current regimen Due to for follow up with Dr. Donell Beers next week Labs due for drug therapy monitoring.   2. Weight loss Continue Boost BID  Encourage oral fluid No tube feedings or aggressive care Wt Readings from Last 3 Encounters:  10/24/17 103 lb 14.4 oz (47.1 kg)  09/27/17 104 lb (47.2 kg)  09/22/17 104 lb 6.4 oz (47.4 kg)    3. Type 2 diabetes mellitus with complication, without long-term current use of insulin (HCC) Lab Results  Component Value Date   HGBA1C 6.3 09/20/2017  Diet controlled  4. Paroxysmal atrial fibrillation (HCC) Rate controlled. She was on multaq twice a day but she kept refusing it in the evening and so we made in once a day which has not affected her rate thus far.  Continue baby aspirin and Cartia XL. Not on anticoagulation due to  falls, goals of care.   5. Acquired hypothyroidism Continue Synthroid 37.5 mcg qd Lab Results  Component Value Date   TSH 2.21 07/12/2017    6. Sensorineural hearing loss (SNHL) of both ears Contributes  to her agitation and dementia  7. Toe ulcer Improved but not healed.  Wound care nurse to assess for dressing change recommendations.   Family/ staff Communication: discussed with staff  Labs/tests ordered:  CMP tegretol level per Dr. Donell Beers for drug therapy monitoring

## 2017-10-26 DIAGNOSIS — R293 Abnormal posture: Secondary | ICD-10-CM | POA: Diagnosis not present

## 2017-10-26 DIAGNOSIS — M48061 Spinal stenosis, lumbar region without neurogenic claudication: Secondary | ICD-10-CM | POA: Diagnosis not present

## 2017-10-26 DIAGNOSIS — R278 Other lack of coordination: Secondary | ICD-10-CM | POA: Diagnosis not present

## 2017-10-26 DIAGNOSIS — I69351 Hemiplegia and hemiparesis following cerebral infarction affecting right dominant side: Secondary | ICD-10-CM | POA: Diagnosis not present

## 2017-10-26 DIAGNOSIS — F0151 Vascular dementia with behavioral disturbance: Secondary | ICD-10-CM | POA: Diagnosis not present

## 2017-10-27 DIAGNOSIS — M48061 Spinal stenosis, lumbar region without neurogenic claudication: Secondary | ICD-10-CM | POA: Diagnosis not present

## 2017-10-27 DIAGNOSIS — I69351 Hemiplegia and hemiparesis following cerebral infarction affecting right dominant side: Secondary | ICD-10-CM | POA: Diagnosis not present

## 2017-10-27 DIAGNOSIS — R293 Abnormal posture: Secondary | ICD-10-CM | POA: Diagnosis not present

## 2017-10-27 DIAGNOSIS — R278 Other lack of coordination: Secondary | ICD-10-CM | POA: Diagnosis not present

## 2017-10-27 DIAGNOSIS — F0151 Vascular dementia with behavioral disturbance: Secondary | ICD-10-CM | POA: Diagnosis not present

## 2017-10-28 DIAGNOSIS — N390 Urinary tract infection, site not specified: Secondary | ICD-10-CM | POA: Diagnosis not present

## 2017-10-28 DIAGNOSIS — R319 Hematuria, unspecified: Secondary | ICD-10-CM | POA: Diagnosis not present

## 2017-11-02 DIAGNOSIS — M48061 Spinal stenosis, lumbar region without neurogenic claudication: Secondary | ICD-10-CM | POA: Diagnosis not present

## 2017-11-02 DIAGNOSIS — R278 Other lack of coordination: Secondary | ICD-10-CM | POA: Diagnosis not present

## 2017-11-02 DIAGNOSIS — I69351 Hemiplegia and hemiparesis following cerebral infarction affecting right dominant side: Secondary | ICD-10-CM | POA: Diagnosis not present

## 2017-11-02 DIAGNOSIS — R293 Abnormal posture: Secondary | ICD-10-CM | POA: Diagnosis not present

## 2017-11-02 DIAGNOSIS — F0151 Vascular dementia with behavioral disturbance: Secondary | ICD-10-CM | POA: Diagnosis not present

## 2017-11-03 DIAGNOSIS — F0151 Vascular dementia with behavioral disturbance: Secondary | ICD-10-CM | POA: Diagnosis not present

## 2017-11-03 DIAGNOSIS — R278 Other lack of coordination: Secondary | ICD-10-CM | POA: Diagnosis not present

## 2017-11-03 DIAGNOSIS — I69351 Hemiplegia and hemiparesis following cerebral infarction affecting right dominant side: Secondary | ICD-10-CM | POA: Diagnosis not present

## 2017-11-03 DIAGNOSIS — M48061 Spinal stenosis, lumbar region without neurogenic claudication: Secondary | ICD-10-CM | POA: Diagnosis not present

## 2017-11-03 DIAGNOSIS — R293 Abnormal posture: Secondary | ICD-10-CM | POA: Diagnosis not present

## 2017-11-07 ENCOUNTER — Non-Acute Institutional Stay (SKILLED_NURSING_FACILITY): Payer: Medicare Other | Admitting: Adult Health

## 2017-11-07 ENCOUNTER — Encounter: Payer: Self-pay | Admitting: Adult Health

## 2017-11-07 DIAGNOSIS — R112 Nausea with vomiting, unspecified: Secondary | ICD-10-CM | POA: Diagnosis not present

## 2017-11-07 DIAGNOSIS — K5901 Slow transit constipation: Secondary | ICD-10-CM | POA: Diagnosis not present

## 2017-11-07 DIAGNOSIS — F0151 Vascular dementia with behavioral disturbance: Secondary | ICD-10-CM

## 2017-11-07 DIAGNOSIS — I69351 Hemiplegia and hemiparesis following cerebral infarction affecting right dominant side: Secondary | ICD-10-CM | POA: Diagnosis not present

## 2017-11-07 DIAGNOSIS — F01518 Vascular dementia, unspecified severity, with other behavioral disturbance: Secondary | ICD-10-CM

## 2017-11-07 DIAGNOSIS — R278 Other lack of coordination: Secondary | ICD-10-CM | POA: Diagnosis not present

## 2017-11-07 DIAGNOSIS — R293 Abnormal posture: Secondary | ICD-10-CM | POA: Diagnosis not present

## 2017-11-07 DIAGNOSIS — M48061 Spinal stenosis, lumbar region without neurogenic claudication: Secondary | ICD-10-CM | POA: Diagnosis not present

## 2017-11-07 NOTE — Progress Notes (Signed)
Location:  Medical illustrator of Service:  SNF (31) Provider:   [name], ANP [facility] [phone]   Kermit Balo, DO  Patient Care Team: Kermit Balo, DO as PCP - General (Geriatric Medicine) Early, Kristen Loader, MD as Consulting Physician (Vascular Surgery) Ranelle Oyster, MD as Consulting Physician (Physical Medicine and Rehabilitation) Micki Riley, MD as Consulting Physician (Neurology) Hillis Range, MD as Consulting Physician (Cardiology) Mckinley Jewel, MD as Consulting Physician (Ophthalmology) Salvatore Marvel, MD as Consulting Physician (Orthopedic Surgery) Marcine Matar, MD as Consulting Physician (Urology)  Extended Emergency Contact Information Primary Emergency Contact: Shirlean Kelly Address: [address]          [place], [place] Home Phone: [phone] Relation: Son Secondary Emergency Contact: Despina [place] Mobile Phone: [phone] Relation: Other  Code Status:  DNR Goals of care: Advanced Directive information Advanced Directives 09/27/2017  Does Patient Have a Medical Advance Directive? Yes  Type of Advance Directive Out of facility DNR (pink MOST or yellow form);Healthcare Power of Attorney  Does patient want to make changes to medical advance directive? No - Patient declined  Copy of Healthcare Power of Attorney in Chart? Yes  Would patient like information on creating a medical advance directive? -  Pre-existing out of facility DNR order (yellow form or pink MOST form) Yellow form placed in chart (order not valid for inpatient use)     Chief Complaint  Patient presents with  . Acute Visit    worsening confusion, not eating    HPI:  Pt is a 84 y.o. female seen today for an acute visit for moaning, gritting teeth, and not swallowing pills. She has advanced vascular dementia and since moving to skilled care in Sept she has had issues  with agitation. She yells frequently and attempts to get out of bed without help. Her brow is furrowed regularly and she cries frequently. She can be combative and hit the staff as well. Other resident's complain about how loud and disturbing the yelling can be. She is followed by Dr Donell Beers and recently the [facility] was decreased to 100 mg qhs on 11/03/17 due to level of 13.3.  She has not had this medication in 3 days due to difficulty getting her to take pills. They have been able to get her to take the risperdal and clonazepam intermittently. She has an order for prn vistaril that was given once over the weekend. She has not verbalized pain but is less talkative and has difficult articulating her words.  She has a left toe wound that is not healed but does not appear painful or tender. It was treated for cellulitis with bactroban.  She has received 1 course of Cipro for a UTI and follow up UA showed reduced WBC and bacteria but no real improvement in her mood or condition.  Also she continues to lose weight and eats very little. Last BM 10/20.  Past Medical History:  Diagnosis Date  . Aortic sclerosis   . Atrial fibrillation (HCC)   . Benign hypertensive heart disease without heart failure   . Benign hypertensive kidney disease with chronic kidney disease stage I through stage IV, or unspecified(403.10)   . Carpal tunnel syndrome   . Chronic anemia   . Chronic anemia   . Chronic renal disease, stage III (HCC)   . Coronary atherosclerosis of native coronary artery   . Diabetes mellitus with chronic kidney disease (HCC)   .  Essential hypertension, benign   . H/O echocardiogram 07/19/09   EF = 60-65%  . Hyperglycemia    Mild  . Hyperlipidemia    Tolerating medication well as of 11/27/12  . Hypothyroidism   . Microalbuminuria 02/2010  . NSTEMI (non-ST elevated myocardial infarction) (HCC) 09/21/09   BM stent RCA  . Osteoarthritis   . Osteoporosis, senile   . Postsurgical percutaneous  transluminal coronary angioplasty status   . Pure hypercholesterolemia   . Sinoatrial node dysfunction (HCC)   . Spinal stenosis   . Spinal stenosis of lumbar region   . Tachy-brady syndrome (HCC)    s/p PPM, battery change 08/01/09  . Vitamin D deficiency 10/2011   Past Surgical History:  Procedure Laterality Date  . CARPAL TUNNEL RELEASE Right 02/07/09  . CORONARY ANGIOPLASTY WITH STENT PLACEMENT  09/21/09   By Dr. Amil Amen. ACS, NSTEMI - BM stent RCA  . OOPHORECTOMY     With hysterectomy. One ovary removed-fibroids.  Marland Kitchen PACEMAKER GENERATOR CHANGE  08/04/09   By Dr. Amil Amen. New pacing generator is a Medtronic EnRhythm, model D1939726, serial [id number]  . PACEMAKER INSERTION  2004   Gen change 08/04/09 by Dr. Amil Amen. New pacing generator is a Medtronic EnRhythm, model D1939726, serial [id number]  . PERCUTANEOUS CORONARY STENT INTERVENTION (PCI-S)  05/08/02   (3 tandem Cypher stent), proximal and mid LAD  . VESICOVAGINAL FISTULA CLOSURE W/ TAH      Allergies  Allergen Reactions  . Fosamax [Alendronate Sodium]   . Other Other (See Comments)    Allergic to crab meat, but no shellfish allergy (can eat lobster, shrimp, etc.)  Crab meat causes angioedema of the throat.  Marland Kitchen Penicillins   . Sulfa Antibiotics     Outpatient Encounter Medications as of 11/07/2017  Medication Sig  . aspirin EC 81 MG tablet Take 81 mg by mouth daily.  . Carbamazepine (EQUETRO) 100 MG CP12 12 hr capsule Take 100 mg by mouth at bedtime.   Marland Kitchen CARTIA XT 120 MG 24 hr capsule Take 120 mg by mouth.   . clonazePAM (KLONOPIN) 1 MG disintegrating tablet Take 0.5 mg by mouth 2 (two) times daily.  Marland Kitchen lactose free nutrition (BOOST) LIQD Take 237 mLs by mouth 2 (two) times daily between meals.  Marland Kitchen levothyroxine (SYNTHROID, LEVOTHROID) 75 MCG tablet Take 37.5 mcg by mouth daily before breakfast.  . methocarbamol (ROBAXIN) 500 MG tablet Take 250 mg by mouth every 8 (eight) hours as needed for muscle spasms.  . MULTAQ 400 MG  tablet TAKE 1 TABLET BY MOUTH TWICE DAILY (Patient taking differently: 400 mg daily. )  . ondansetron (ZOFRAN) 4 MG tablet Take 4 mg by mouth every 8 (eight) hours as needed for nausea or vomiting.  . polyethylene glycol (MIRALAX / GLYCOLAX) packet Take 17 g by mouth daily as needed.   . risperiDONE (RISPERDAL M-TABS) 0.5 MG disintegrating tablet Take 0.25 mg by mouth at bedtime.    No facility-administered encounter medications on file as of 11/07/2017.     Review of Systems  Unable to perform ROS: Dementia    Immunization History  Administered Date(s) Administered  . Influenza Inj Mdck Quad Pf 11/06/2015  . Influenza-Unspecified 11/09/2012, 10/31/2014, 11/08/2016  . Pneumococcal Polysaccharide-23 03/23/2005, 04/07/2016  . Td 03/18/2004  . Tdap 07/01/2016  . Zoster 08/07/2007  . Zoster Recombinat (Shingrix) 02/21/2017   Pertinent  Health Maintenance Due  Topic Date Due  . PNA vac Low Risk Adult (2 of 2 - PCV13) 04/07/2017  . INFLUENZA VACCINE  08/18/2017  . HEMOGLOBIN A1C  03/21/2018  . FOOT EXAM  07/30/2018   Fall Risk  07/13/2017 07/06/2017 01/05/2017 08/25/2016 06/30/2016  Falls in the past year? No No No No No  Number falls in past yr: - - - - -  Injury with Fall? - - - - -  Risk for fall due to : - - - - -   Functional Status Survey:    Vitals:   11/07/17 1038  BP: 120/70  Pulse: 60  Resp: 18   There is no height or weight on file to calculate BMI. Physical Exam  Constitutional: She appears distressed (mildly).  HENT:  Mouth/Throat: No oropharyngeal exudate.  Very dry mouth  Eyes: Pupils are equal, round, and reactive to light. Conjunctivae are normal. Right eye exhibits no discharge. Left eye exhibits no discharge.  Neck: No JVD present. No thyromegaly present.  Cardiovascular:  No murmur heard. Irreg, no edema  Pulmonary/Chest: Effort normal and breath sounds normal. No stridor. No respiratory distress.  Abdominal: Soft. Bowel sounds are normal. She exhibits  no distension.  Musculoskeletal: She exhibits no edema or tenderness.  Lymphadenopathy:    She has no cervical adenopathy.  Neurological: She is alert.  Right facial droop and right arm weakness  Skin: Skin is warm and dry. She is not diaphoretic. There is pallor.  Skin tenting. Left 2nd toe with 1 cm wound between the toes 100% yellow base, mild erythema  Psychiatric:  crying  Nursing note and vitals reviewed.   Labs reviewed: Recent Labs    07/12/17 0300 07/29/17 09/20/17  NA 140 136* 140  K 4.3 4.5 4.6  BUN 16 20 13   CREATININE 1.2* 1.1 1.1   Recent Labs    07/12/17 0300 07/29/17 09/20/17  AST 120* 121* 11*  ALT 48* 48* 12  ALKPHOS 298* 541* 93   Recent Labs    07/12/17 0300 07/29/17 09/20/17 10/12/17  WBC 14.1  --  11.3 7.7  HGB 13.8 14.7 13.4 11.6*  HCT 44 46 40 35*  PLT 337 369 340 301   Lab Results  Component Value Date   TSH 2.21 07/12/2017   Lab Results  Component Value Date   HGBA1C 6.3 09/20/2017   Lab Results  Component Value Date   CHOL 135 04/05/2016   HDL 48 04/05/2016   LDLCALC 60 04/05/2016   TRIG 137 04/05/2016   CHOLHDL 2.9 05/09/2014    Significant Diagnostic Results in last 30 days:  No results found.  Assessment/Plan  1. Vascular dementia with behavior disturbance (HCC) She has declined further with signs of either physical or mental anguish. She is not eating or drinking well and is slowly moving through the dying process. I called and spoke with her son and let him know that I felt she was uncomfortable and offered scheduled roxanol which would be easier for her to swallow and to discontinue other oral meds. He declined at this time as he did not feel she was in pain but rather he feels the medications are sedating her and he feels she continues to have agitation associated with her move to skilled care. We will continue to monitor her and encourage fluid intake as tolerated. We will avoid IV hydration as she would likely pull the  IV out. We agreed to try scheduled tylenol liquid to help control her pain.   2. Non-intractable vomiting with nausea, unspecified vomiting type Vomited boost x 1.  Soft abd and bowel sounds present. Phenergan supp given  per protocol.   3. Slow transit constipation Staff report that when a phenergan supp was given to help with vomiting she had a large amount of soft stool in her rectum. Fleets enema ordered.     Family/ staff Communication: discussed with her son Dr. Newell Coral  Labs/tests ordered:  NA

## 2017-11-08 DIAGNOSIS — R278 Other lack of coordination: Secondary | ICD-10-CM | POA: Diagnosis not present

## 2017-11-08 DIAGNOSIS — Z23 Encounter for immunization: Secondary | ICD-10-CM | POA: Diagnosis not present

## 2017-11-08 DIAGNOSIS — F0151 Vascular dementia with behavioral disturbance: Secondary | ICD-10-CM | POA: Diagnosis not present

## 2017-11-08 DIAGNOSIS — R293 Abnormal posture: Secondary | ICD-10-CM | POA: Diagnosis not present

## 2017-11-08 DIAGNOSIS — M48061 Spinal stenosis, lumbar region without neurogenic claudication: Secondary | ICD-10-CM | POA: Diagnosis not present

## 2017-11-08 DIAGNOSIS — I69351 Hemiplegia and hemiparesis following cerebral infarction affecting right dominant side: Secondary | ICD-10-CM | POA: Diagnosis not present

## 2017-11-11 DIAGNOSIS — F0151 Vascular dementia with behavioral disturbance: Secondary | ICD-10-CM | POA: Diagnosis not present

## 2017-11-11 DIAGNOSIS — M48061 Spinal stenosis, lumbar region without neurogenic claudication: Secondary | ICD-10-CM | POA: Diagnosis not present

## 2017-11-11 DIAGNOSIS — I69351 Hemiplegia and hemiparesis following cerebral infarction affecting right dominant side: Secondary | ICD-10-CM | POA: Diagnosis not present

## 2017-11-11 DIAGNOSIS — R278 Other lack of coordination: Secondary | ICD-10-CM | POA: Diagnosis not present

## 2017-11-11 DIAGNOSIS — R293 Abnormal posture: Secondary | ICD-10-CM | POA: Diagnosis not present

## 2017-11-18 DEATH — deceased

## 2018-01-25 ENCOUNTER — Encounter: Payer: Self-pay | Admitting: Internal Medicine
